# Patient Record
Sex: Male | Born: 1950
Health system: Southern US, Community
[De-identification: ages and names within clinical notes are randomized; demographics above are authoritative.]

## PROBLEM LIST (undated history)

## (undated) DIAGNOSIS — N200 Calculus of kidney: Secondary | ICD-10-CM

## (undated) DIAGNOSIS — I509 Heart failure, unspecified: Secondary | ICD-10-CM

## (undated) DIAGNOSIS — E785 Hyperlipidemia, unspecified: Secondary | ICD-10-CM

## (undated) DIAGNOSIS — Z5189 Encounter for other specified aftercare: Secondary | ICD-10-CM

## (undated) DIAGNOSIS — D649 Anemia, unspecified: Secondary | ICD-10-CM

## (undated) DIAGNOSIS — T7840XA Allergy, unspecified, initial encounter: Secondary | ICD-10-CM

## (undated) DIAGNOSIS — G4730 Sleep apnea, unspecified: Secondary | ICD-10-CM

## (undated) DIAGNOSIS — F329 Major depressive disorder, single episode, unspecified: Secondary | ICD-10-CM

## (undated) DIAGNOSIS — H409 Unspecified glaucoma: Secondary | ICD-10-CM

## (undated) DIAGNOSIS — I10 Essential (primary) hypertension: Secondary | ICD-10-CM

## (undated) DIAGNOSIS — F32A Depression, unspecified: Secondary | ICD-10-CM

## (undated) DIAGNOSIS — N400 Enlarged prostate without lower urinary tract symptoms: Secondary | ICD-10-CM

## (undated) DIAGNOSIS — E119 Type 2 diabetes mellitus without complications: Secondary | ICD-10-CM

## (undated) DIAGNOSIS — K219 Gastro-esophageal reflux disease without esophagitis: Secondary | ICD-10-CM

## (undated) HISTORY — DX: Type 2 diabetes mellitus without complications: E11.9

## (undated) HISTORY — DX: Anemia, unspecified: D64.9

## (undated) HISTORY — DX: Calculus of kidney: N20.0

## (undated) HISTORY — DX: Unspecified glaucoma: H40.9

## (undated) HISTORY — DX: Heart failure, unspecified: I50.9

## (undated) HISTORY — PX: EYE SURGERY: SHX253

## (undated) HISTORY — DX: Sleep apnea, unspecified: G47.30

## (undated) HISTORY — DX: Major depressive disorder, single episode, unspecified: F32.9

## (undated) HISTORY — DX: Encounter for other specified aftercare: Z51.89

## (undated) HISTORY — DX: Allergy, unspecified, initial encounter: T78.40XA

## (undated) HISTORY — DX: Benign prostatic hyperplasia without lower urinary tract symptoms: N40.0

## (undated) HISTORY — DX: Hyperlipidemia, unspecified: E78.5

## (undated) HISTORY — PX: POLYPECTOMY: SHX149

## (undated) HISTORY — DX: Gastro-esophageal reflux disease without esophagitis: K21.9

## (undated) HISTORY — PX: CARDIAC CATHETERIZATION: SHX172

## (undated) HISTORY — DX: Depression, unspecified: F32.A

## (undated) HISTORY — DX: Essential (primary) hypertension: I10

---

## 1997-09-02 ENCOUNTER — Inpatient Hospital Stay (HOSPITAL_COMMUNITY): Admission: EM | Admit: 1997-09-02 | Discharge: 1997-09-04 | Payer: Self-pay | Admitting: Emergency Medicine

## 2000-09-21 ENCOUNTER — Encounter: Payer: Self-pay | Admitting: Emergency Medicine

## 2000-09-21 ENCOUNTER — Emergency Department (HOSPITAL_COMMUNITY): Admission: EM | Admit: 2000-09-21 | Discharge: 2000-09-21 | Payer: Self-pay | Admitting: Emergency Medicine

## 2000-09-27 ENCOUNTER — Encounter: Payer: Self-pay | Admitting: Emergency Medicine

## 2000-09-27 ENCOUNTER — Emergency Department (HOSPITAL_COMMUNITY): Admission: EM | Admit: 2000-09-27 | Discharge: 2000-09-28 | Payer: Self-pay | Admitting: Emergency Medicine

## 2001-06-02 ENCOUNTER — Encounter: Admission: RE | Admit: 2001-06-02 | Discharge: 2001-06-02 | Payer: Self-pay | Admitting: Internal Medicine

## 2001-06-02 ENCOUNTER — Encounter: Payer: Self-pay | Admitting: Internal Medicine

## 2004-02-14 ENCOUNTER — Ambulatory Visit: Payer: Self-pay | Admitting: Internal Medicine

## 2004-02-21 ENCOUNTER — Ambulatory Visit: Payer: Self-pay | Admitting: Internal Medicine

## 2004-02-28 ENCOUNTER — Ambulatory Visit: Payer: Self-pay

## 2004-02-28 ENCOUNTER — Ambulatory Visit: Payer: Self-pay | Admitting: Internal Medicine

## 2004-03-21 ENCOUNTER — Encounter: Payer: Self-pay | Admitting: Internal Medicine

## 2004-03-21 ENCOUNTER — Ambulatory Visit: Payer: Self-pay

## 2004-04-05 ENCOUNTER — Ambulatory Visit: Payer: Self-pay | Admitting: Internal Medicine

## 2004-04-15 ENCOUNTER — Ambulatory Visit: Payer: Self-pay | Admitting: Internal Medicine

## 2004-05-17 ENCOUNTER — Ambulatory Visit: Payer: Self-pay | Admitting: Internal Medicine

## 2004-07-16 ENCOUNTER — Ambulatory Visit: Payer: Self-pay | Admitting: Internal Medicine

## 2004-09-18 ENCOUNTER — Ambulatory Visit: Payer: Self-pay | Admitting: Internal Medicine

## 2004-09-25 ENCOUNTER — Ambulatory Visit: Payer: Self-pay | Admitting: Internal Medicine

## 2004-12-25 ENCOUNTER — Ambulatory Visit: Payer: Self-pay | Admitting: Internal Medicine

## 2005-01-22 ENCOUNTER — Ambulatory Visit: Payer: Self-pay | Admitting: Internal Medicine

## 2005-02-12 ENCOUNTER — Ambulatory Visit: Payer: Self-pay | Admitting: Internal Medicine

## 2005-04-15 ENCOUNTER — Ambulatory Visit: Payer: Self-pay | Admitting: Internal Medicine

## 2005-05-28 ENCOUNTER — Ambulatory Visit: Payer: Self-pay | Admitting: Internal Medicine

## 2005-07-13 ENCOUNTER — Ambulatory Visit (HOSPITAL_COMMUNITY): Admission: RE | Admit: 2005-07-13 | Discharge: 2005-07-13 | Payer: Self-pay | Admitting: Family Medicine

## 2005-07-24 ENCOUNTER — Ambulatory Visit: Payer: Self-pay | Admitting: Internal Medicine

## 2005-12-17 ENCOUNTER — Ambulatory Visit: Payer: Self-pay | Admitting: Internal Medicine

## 2006-03-17 HISTORY — PX: COLONOSCOPY: SHX174

## 2006-03-27 ENCOUNTER — Ambulatory Visit: Payer: Self-pay | Admitting: Internal Medicine

## 2006-04-10 ENCOUNTER — Encounter (INDEPENDENT_AMBULATORY_CARE_PROVIDER_SITE_OTHER): Payer: Self-pay | Admitting: *Deleted

## 2006-04-10 ENCOUNTER — Encounter: Payer: Self-pay | Admitting: Internal Medicine

## 2006-04-10 ENCOUNTER — Ambulatory Visit: Payer: Self-pay | Admitting: Internal Medicine

## 2006-04-27 ENCOUNTER — Ambulatory Visit: Payer: Self-pay | Admitting: Internal Medicine

## 2006-04-27 LAB — CONVERTED CEMR LAB
ALT: 34 units/L (ref 0–40)
AST: 31 units/L (ref 0–37)
Basophils Relative: 0.7 % (ref 0.0–1.0)
Bilirubin, Direct: 0.1 mg/dL (ref 0.0–0.3)
CO2: 30 meq/L (ref 19–32)
Calcium: 8.8 mg/dL (ref 8.4–10.5)
Chloride: 103 meq/L (ref 96–112)
Creatinine, Ser: 0.8 mg/dL (ref 0.4–1.5)
Eosinophils Absolute: 0.1 10*3/uL (ref 0.0–0.6)
Eosinophils Relative: 1.5 % (ref 0.0–5.0)
GFR calc non Af Amer: 107 mL/min
Glucose, Bld: 130 mg/dL — ABNORMAL HIGH (ref 70–99)
HDL: 41.7 mg/dL (ref >39.0)
Platelets: 222 10*3/uL (ref 150–400)
RBC: 4.42 M/uL (ref 4.22–5.81)
RDW: 13 % (ref 11.5–14.6)
TSH: 2.19 microintl units/mL (ref 0.35–5.50)
Total CHOL/HDL Ratio: 5.7
Triglycerides: 126 mg/dL (ref 0–149)
VLDL: 25 mg/dL (ref 0–40)
WBC: 5.3 10*3/uL (ref 4.5–10.5)

## 2006-05-04 ENCOUNTER — Ambulatory Visit: Payer: Self-pay | Admitting: Internal Medicine

## 2006-09-28 ENCOUNTER — Ambulatory Visit: Payer: Self-pay | Admitting: Internal Medicine

## 2006-09-28 LAB — CONVERTED CEMR LAB
Alkaline Phosphatase: 78 units/L (ref 39–117)
BUN: 12 mg/dL (ref 6–23)
Bilirubin, Direct: 0.1 mg/dL (ref 0.0–0.3)
CO2: 30 meq/L (ref 19–32)
GFR calc Af Amer: 112 mL/min
Glucose, Bld: 93 mg/dL (ref 70–99)
Potassium: 4.1 meq/L (ref 3.5–5.1)
Total CHOL/HDL Ratio: 3.4
Total Protein: 6.9 g/dL (ref 6.0–8.3)
Triglycerides: 87 mg/dL (ref 0–149)

## 2006-09-30 DIAGNOSIS — G4733 Obstructive sleep apnea (adult) (pediatric): Secondary | ICD-10-CM | POA: Insufficient documentation

## 2006-09-30 DIAGNOSIS — K219 Gastro-esophageal reflux disease without esophagitis: Secondary | ICD-10-CM

## 2006-09-30 DIAGNOSIS — E785 Hyperlipidemia, unspecified: Secondary | ICD-10-CM

## 2006-09-30 DIAGNOSIS — J309 Allergic rhinitis, unspecified: Secondary | ICD-10-CM | POA: Insufficient documentation

## 2006-09-30 DIAGNOSIS — E1169 Type 2 diabetes mellitus with other specified complication: Secondary | ICD-10-CM | POA: Insufficient documentation

## 2006-09-30 DIAGNOSIS — F39 Unspecified mood [affective] disorder: Secondary | ICD-10-CM

## 2006-09-30 DIAGNOSIS — Z8546 Personal history of malignant neoplasm of prostate: Secondary | ICD-10-CM

## 2006-09-30 DIAGNOSIS — I10 Essential (primary) hypertension: Secondary | ICD-10-CM

## 2006-09-30 HISTORY — DX: Essential (primary) hypertension: I10

## 2006-09-30 HISTORY — DX: Hyperlipidemia, unspecified: E78.5

## 2006-09-30 HISTORY — DX: Gastro-esophageal reflux disease without esophagitis: K21.9

## 2006-09-30 HISTORY — DX: Obstructive sleep apnea (adult) (pediatric): G47.33

## 2006-09-30 HISTORY — DX: Allergic rhinitis, unspecified: J30.9

## 2006-12-21 ENCOUNTER — Ambulatory Visit: Payer: Self-pay | Admitting: Internal Medicine

## 2006-12-21 LAB — CONVERTED CEMR LAB
Alkaline Phosphatase: 75 units/L (ref 39–117)
BUN: 10 mg/dL (ref 6–23)
Basophils Absolute: 0 10*3/uL (ref 0.0–0.1)
Basophils Relative: 0.2 % (ref 0.0–1.0)
Bilirubin Urine: NEGATIVE
CO2: 31 meq/L (ref 19–32)
Cholesterol: 130 mg/dL (ref 0–200)
GFR calc Af Amer: 112 mL/min
Glucose, Urine, Semiquant: NEGATIVE
HDL: 31.8 mg/dL — ABNORMAL LOW (ref >39.0)
Hemoglobin: 12.9 g/dL — ABNORMAL LOW (ref 13.0–17.0)
Ketones, urine, test strip: NEGATIVE
Lymphocytes Relative: 36.4 % (ref 12.0–46.0)
MCHC: 35.9 g/dL (ref 30.0–36.0)
Monocytes Absolute: 0.5 10*3/uL (ref 0.2–0.7)
Monocytes Relative: 8.1 % (ref 3.0–11.0)
Neutro Abs: 3.4 10*3/uL (ref 1.4–7.7)
PSA: 2.57 ng/mL (ref 0.10–4.00)
Potassium: 4.4 meq/L (ref 3.5–5.1)
Sodium: 144 meq/L (ref 135–145)
Specific Gravity, Urine: 1.02
TSH: 2.95 microintl units/mL (ref 0.35–5.50)
Total Protein: 6.4 g/dL (ref 6.0–8.3)

## 2007-03-16 ENCOUNTER — Encounter: Payer: Self-pay | Admitting: Internal Medicine

## 2007-05-21 ENCOUNTER — Encounter: Payer: Self-pay | Admitting: Internal Medicine

## 2007-05-21 ENCOUNTER — Ambulatory Visit: Payer: Self-pay | Admitting: Cardiology

## 2007-05-28 ENCOUNTER — Encounter: Payer: Self-pay | Admitting: Internal Medicine

## 2007-05-28 ENCOUNTER — Ambulatory Visit: Payer: Self-pay

## 2007-09-16 ENCOUNTER — Ambulatory Visit: Payer: Self-pay | Admitting: Internal Medicine

## 2007-09-16 LAB — CONVERTED CEMR LAB
ALT: 30 units/L (ref 0–53)
AST: 21 units/L (ref 0–37)
Albumin: 3.6 g/dL (ref 3.5–5.2)
BUN: 9 mg/dL (ref 6–23)
Basophils Absolute: 0 10*3/uL (ref 0.0–0.1)
Basophils Relative: 0.2 % (ref 0.0–1.0)
Blood in Urine, dipstick: NEGATIVE
Calcium: 8.7 mg/dL (ref 8.4–10.5)
Creatinine, Ser: 0.9 mg/dL (ref 0.4–1.5)
Direct LDL: 174.5 mg/dL
Eosinophils Absolute: 0.1 10*3/uL (ref 0.0–0.7)
Eosinophils Relative: 1.8 % (ref 0.0–5.0)
GFR calc non Af Amer: 92 mL/min
Glucose, Urine, Semiquant: NEGATIVE
HCT: 41.4 % (ref 39.0–52.0)
HDL: 33.8 mg/dL — ABNORMAL LOW (ref >39.0)
Hemoglobin: 14.3 g/dL (ref 13.0–17.0)
MCHC: 34.5 g/dL (ref 30.0–36.0)
MCV: 88.2 fL (ref 78.0–100.0)
Neutro Abs: 2.5 10*3/uL (ref 1.4–7.7)
PSA: 1.82 ng/mL (ref 0.10–4.00)
RBC: 4.7 M/uL (ref 4.22–5.81)
Specific Gravity, Urine: 1.01
Total Bilirubin: 0.8 mg/dL (ref 0.3–1.2)
WBC: 4.5 10*3/uL (ref 4.5–10.5)
pH: 6

## 2007-09-23 ENCOUNTER — Ambulatory Visit: Payer: Self-pay | Admitting: Internal Medicine

## 2007-09-23 LAB — CONVERTED CEMR LAB
Cholesterol, target level: 200 mg/dL
LDL Goal: 100 mg/dL

## 2007-11-24 ENCOUNTER — Telehealth: Payer: Self-pay | Admitting: Internal Medicine

## 2007-12-20 ENCOUNTER — Ambulatory Visit: Payer: Self-pay | Admitting: Internal Medicine

## 2007-12-20 ENCOUNTER — Telehealth: Payer: Self-pay | Admitting: Internal Medicine

## 2007-12-20 DIAGNOSIS — R079 Chest pain, unspecified: Secondary | ICD-10-CM | POA: Insufficient documentation

## 2007-12-20 HISTORY — DX: Chest pain, unspecified: R07.9

## 2007-12-23 ENCOUNTER — Telehealth: Payer: Self-pay | Admitting: Internal Medicine

## 2007-12-28 ENCOUNTER — Ambulatory Visit: Payer: Self-pay | Admitting: Internal Medicine

## 2007-12-28 LAB — CONVERTED CEMR LAB
ALT: 45 units/L (ref 0–53)
Bilirubin, Direct: 0.1 mg/dL (ref 0.0–0.3)
Direct LDL: 97.4 mg/dL
HDL: 29.6 mg/dL — ABNORMAL LOW (ref >39.0)
Hgb A1c MFr Bld: 6.2 % — ABNORMAL HIGH (ref 4.6–6.0)
Total Bilirubin: 0.6 mg/dL (ref 0.3–1.2)
Total CHOL/HDL Ratio: 5.7
Triglycerides: 333 mg/dL (ref 0–149)

## 2008-01-04 ENCOUNTER — Ambulatory Visit: Payer: Self-pay | Admitting: Internal Medicine

## 2008-02-02 ENCOUNTER — Ambulatory Visit: Payer: Self-pay | Admitting: Internal Medicine

## 2008-02-09 ENCOUNTER — Ambulatory Visit: Payer: Self-pay | Admitting: Internal Medicine

## 2008-02-14 ENCOUNTER — Telehealth: Payer: Self-pay | Admitting: Internal Medicine

## 2008-02-21 ENCOUNTER — Ambulatory Visit: Payer: Self-pay | Admitting: Internal Medicine

## 2008-02-22 ENCOUNTER — Telehealth: Payer: Self-pay | Admitting: Internal Medicine

## 2008-02-22 DIAGNOSIS — R1011 Right upper quadrant pain: Secondary | ICD-10-CM

## 2008-02-25 ENCOUNTER — Ambulatory Visit (HOSPITAL_COMMUNITY): Admission: RE | Admit: 2008-02-25 | Discharge: 2008-02-25 | Payer: Self-pay | Admitting: Internal Medicine

## 2008-05-17 ENCOUNTER — Ambulatory Visit: Payer: Self-pay | Admitting: Internal Medicine

## 2008-08-08 ENCOUNTER — Ambulatory Visit: Payer: Self-pay | Admitting: Internal Medicine

## 2008-08-08 LAB — CONVERTED CEMR LAB
ALT: 25 units/L (ref 0–53)
AST: 20 units/L (ref 0–37)
Albumin: 3.7 g/dL (ref 3.5–5.2)
HDL: 39.8 mg/dL (ref >39.00)
Testosterone: 435.23 ng/dL (ref 350.00–890.00)
Total Protein: 6.6 g/dL (ref 6.0–8.3)
Triglycerides: 83 mg/dL (ref 0.0–149.0)
VLDL: 16.6 mg/dL (ref 0.0–40.0)

## 2008-08-22 ENCOUNTER — Ambulatory Visit: Payer: Self-pay | Admitting: Internal Medicine

## 2008-08-22 DIAGNOSIS — B070 Plantar wart: Secondary | ICD-10-CM

## 2008-12-18 ENCOUNTER — Ambulatory Visit: Payer: Self-pay | Admitting: Internal Medicine

## 2008-12-18 LAB — CONVERTED CEMR LAB
AST: 28 units/L (ref 0–37)
Albumin: 3.8 g/dL (ref 3.5–5.2)
Alkaline Phosphatase: 75 units/L (ref 39–117)
Basophils Relative: 0.5 % (ref 0.0–3.0)
CO2: 31 meq/L (ref 19–32)
Calcium: 9 mg/dL (ref 8.4–10.5)
Chloride: 107 meq/L (ref 96–112)
Eosinophils Absolute: 0.1 10*3/uL (ref 0.0–0.7)
Glucose, Urine, Semiquant: NEGATIVE
HCT: 37.9 % — ABNORMAL LOW (ref 39.0–52.0)
HDL: 38.7 mg/dL — ABNORMAL LOW (ref >39.00)
Hemoglobin: 13 g/dL (ref 13.0–17.0)
Lymphocytes Relative: 37.3 % (ref 12.0–46.0)
Lymphs Abs: 1.7 10*3/uL (ref 0.7–4.0)
MCHC: 34.4 g/dL (ref 30.0–36.0)
Monocytes Relative: 6.3 % (ref 3.0–12.0)
Neutro Abs: 2.5 10*3/uL (ref 1.4–7.7)
Nitrite: NEGATIVE
Potassium: 3.8 meq/L (ref 3.5–5.1)
RBC: 4.3 M/uL (ref 4.22–5.81)
Sodium: 146 meq/L — ABNORMAL HIGH (ref 135–145)
Specific Gravity, Urine: 1.02
Total CHOL/HDL Ratio: 4
Total Protein: 7.1 g/dL (ref 6.0–8.3)
WBC Urine, dipstick: NEGATIVE
pH: 6

## 2008-12-25 ENCOUNTER — Ambulatory Visit: Payer: Self-pay | Admitting: Internal Medicine

## 2009-02-26 ENCOUNTER — Ambulatory Visit: Payer: Self-pay | Admitting: Internal Medicine

## 2009-03-05 ENCOUNTER — Ambulatory Visit: Payer: Self-pay | Admitting: Internal Medicine

## 2009-03-05 DIAGNOSIS — E1142 Type 2 diabetes mellitus with diabetic polyneuropathy: Secondary | ICD-10-CM | POA: Insufficient documentation

## 2009-03-05 DIAGNOSIS — E1165 Type 2 diabetes mellitus with hyperglycemia: Secondary | ICD-10-CM

## 2009-03-05 DIAGNOSIS — E1149 Type 2 diabetes mellitus with other diabetic neurological complication: Secondary | ICD-10-CM

## 2009-03-05 DIAGNOSIS — IMO0002 Reserved for concepts with insufficient information to code with codable children: Secondary | ICD-10-CM

## 2009-03-05 DIAGNOSIS — E1140 Type 2 diabetes mellitus with diabetic neuropathy, unspecified: Secondary | ICD-10-CM | POA: Insufficient documentation

## 2009-03-05 HISTORY — DX: Type 2 diabetes mellitus with hyperglycemia: E11.65

## 2009-03-05 HISTORY — DX: Type 2 diabetes mellitus with diabetic polyneuropathy: E11.42

## 2009-03-05 HISTORY — DX: Reserved for concepts with insufficient information to code with codable children: IMO0002

## 2009-03-05 HISTORY — DX: Type 2 diabetes mellitus with hyperglycemia: E11.49

## 2009-03-16 ENCOUNTER — Emergency Department (HOSPITAL_COMMUNITY): Admission: EM | Admit: 2009-03-16 | Discharge: 2009-03-17 | Payer: Self-pay | Admitting: Emergency Medicine

## 2009-03-30 ENCOUNTER — Encounter (INDEPENDENT_AMBULATORY_CARE_PROVIDER_SITE_OTHER): Payer: Self-pay | Admitting: *Deleted

## 2009-04-12 ENCOUNTER — Encounter (INDEPENDENT_AMBULATORY_CARE_PROVIDER_SITE_OTHER): Payer: Self-pay | Admitting: *Deleted

## 2009-04-12 ENCOUNTER — Ambulatory Visit: Payer: Self-pay | Admitting: Internal Medicine

## 2009-04-25 ENCOUNTER — Ambulatory Visit: Payer: Self-pay | Admitting: Internal Medicine

## 2009-04-26 ENCOUNTER — Encounter: Payer: Self-pay | Admitting: Internal Medicine

## 2009-05-28 ENCOUNTER — Ambulatory Visit: Payer: Self-pay | Admitting: Internal Medicine

## 2009-05-28 LAB — CONVERTED CEMR LAB
Bilirubin, Direct: 0.1 mg/dL (ref 0.0–0.3)
LDL Cholesterol: 82 mg/dL (ref 0–99)
Total Bilirubin: 0.6 mg/dL (ref 0.3–1.2)
VLDL: 17.8 mg/dL (ref 0.0–40.0)

## 2009-06-04 ENCOUNTER — Ambulatory Visit: Payer: Self-pay | Admitting: Internal Medicine

## 2009-08-27 ENCOUNTER — Ambulatory Visit: Payer: Self-pay | Admitting: Internal Medicine

## 2009-08-27 LAB — CONVERTED CEMR LAB: Hgb A1c MFr Bld: 7.2 % — ABNORMAL HIGH (ref 4.6–6.5)

## 2009-09-03 ENCOUNTER — Ambulatory Visit: Payer: Self-pay | Admitting: Internal Medicine

## 2009-09-19 ENCOUNTER — Encounter: Payer: Self-pay | Admitting: Internal Medicine

## 2009-09-19 DIAGNOSIS — E291 Testicular hypofunction: Secondary | ICD-10-CM

## 2009-09-19 DIAGNOSIS — M779 Enthesopathy, unspecified: Secondary | ICD-10-CM | POA: Insufficient documentation

## 2009-09-19 HISTORY — DX: Testicular hypofunction: E29.1

## 2009-09-27 ENCOUNTER — Telehealth: Payer: Self-pay | Admitting: *Deleted

## 2009-09-27 ENCOUNTER — Telehealth: Payer: Self-pay | Admitting: Internal Medicine

## 2009-10-04 ENCOUNTER — Encounter: Payer: Self-pay | Admitting: Internal Medicine

## 2009-10-04 ENCOUNTER — Telehealth: Payer: Self-pay | Admitting: Internal Medicine

## 2009-12-03 ENCOUNTER — Ambulatory Visit: Payer: Self-pay | Admitting: Internal Medicine

## 2009-12-03 LAB — CONVERTED CEMR LAB: Hgb A1c MFr Bld: 7.8 % — ABNORMAL HIGH (ref 4.6–6.5)

## 2009-12-10 ENCOUNTER — Ambulatory Visit: Payer: Self-pay | Admitting: Internal Medicine

## 2010-02-22 ENCOUNTER — Ambulatory Visit: Payer: Self-pay | Admitting: Internal Medicine

## 2010-02-22 LAB — CONVERTED CEMR LAB
Creatinine,U: 219.7 mg/dL
Microalb Creat Ratio: 0.8 mg/g (ref 0.0–30.0)
Microalb, Ur: 1.7 mg/dL (ref 0.0–1.9)

## 2010-03-01 ENCOUNTER — Ambulatory Visit: Payer: Self-pay | Admitting: Internal Medicine

## 2010-03-01 DIAGNOSIS — K760 Fatty (change of) liver, not elsewhere classified: Secondary | ICD-10-CM

## 2010-03-01 HISTORY — DX: Fatty (change of) liver, not elsewhere classified: K76.0

## 2010-03-01 LAB — CONVERTED CEMR LAB
BUN: 16 mg/dL (ref 6–23)
Calcium: 9 mg/dL (ref 8.4–10.5)
Creatinine, Ser: 0.7 mg/dL (ref 0.4–1.5)
GFR calc non Af Amer: 124.39 mL/min (ref >60.00)
Glucose, Bld: 160 mg/dL — ABNORMAL HIGH (ref 70–99)

## 2010-03-05 ENCOUNTER — Ambulatory Visit (HOSPITAL_COMMUNITY)
Admission: RE | Admit: 2010-03-05 | Discharge: 2010-03-05 | Payer: Self-pay | Source: Home / Self Care | Attending: Internal Medicine | Admitting: Internal Medicine

## 2010-04-16 NOTE — Assessment & Plan Note (Signed)
Summary: 3 MTH ROV // RS   Vital Signs:  Patient profile:   60 year old male Height:      75 inches Weight:      306 pounds BMI:     38.39 Temp:     98.2 degrees F oral Pulse rate:   72 / minute Resp:     14 per minute BP sitting:   132 / 82  (left arm) Cuff size:   large  Vitals Entered By: Willy Eddy, LPN (December 10, 2009 9:44 AM) CC: roa-labs- dr Aldean Ast wants him to stop the hctz in the benicar due to being on flomax, Hypertension Management Is Patient Diabetic? No   Primary Care Provider:  Stacie Glaze MD  CC:  roa-labs- dr Aldean Ast wants him to stop the hctz in the benicar due to being on flomax and Hypertension Management.  History of Present Illness: The pt presents for follow up of HTN and DM Does not check CBG's Has persistant pain in the right flank at the floating ribs weigth loss has not continued has not been exercizing liver pain?     Hypertension History:      He denies headache, chest pain, palpitations, dyspnea with exertion, orthopnea, PND, peripheral edema, visual symptoms, neurologic problems, syncope, and side effects from treatment.        Positive major cardiovascular risk factors include male age 32 years old or older, diabetes, hyperlipidemia, and hypertension.  Negative major cardiovascular risk factors include non-tobacco-user status.     Preventive Screening-Counseling & Management  Alcohol-Tobacco     Smoking Status: quit  Problems Prior to Update: 1)  Hypogonadism  (ICD-257.2) 2)  Hamstring Tendinitis  (ICD-726.90) 3)  Plantar Wart  (ICD-078.12) 4)  Abdominal Pain, Right Upper Quadrant  (ICD-789.01) 5)  Chest Pain, Intermittent  (ICD-786.50) 6)  Diabetes Mellitus, Controlled  (ICD-250.00) 7)  Physical Examination  (ICD-V70.0) 8)  Insomnia  (ICD-780.52) 9)  Hypertension  (ICD-401.9) 10)  Hyperlipidemia  (ICD-272.4) 11)  Gerd  (ICD-530.81) 12)  Depression  (ICD-311) 13)  Prostate Cancer, Hx of  (ICD-V10.46) 14)   Allergic Rhinitis  (ICD-477.9)  Current Problems (verified): 1)  Hypogonadism  (ICD-257.2) 2)  Hamstring Tendinitis  (ICD-726.90) 3)  Plantar Wart  (ICD-078.12) 4)  Abdominal Pain, Right Upper Quadrant  (ICD-789.01) 5)  Chest Pain, Intermittent  (ICD-786.50) 6)  Diabetes Mellitus, Controlled  (ICD-250.00) 7)  Physical Examination  (ICD-V70.0) 8)  Insomnia  (ICD-780.52) 9)  Hypertension  (ICD-401.9) 10)  Hyperlipidemia  (ICD-272.4) 11)  Gerd  (ICD-530.81) 12)  Depression  (ICD-311) 13)  Prostate Cancer, Hx of  (ICD-V10.46) 14)  Allergic Rhinitis  (ICD-477.9)  Medications Prior to Update: 1)  Acular 0.5 % Soln (Ketorolac Tromethamine) .... Use As Directed 2)  Benicar Hct 20-12.5 Mg Tabs (Olmesartan Medoxomil-Hctz) .Marland Kitchen.. 1 Once Daily Hold 3)  Crestor 10 Mg Tabs (Rosuvastatin Calcium) .Marland Kitchen.. 1 Once Daily 4)  Flomax 0.4 Mg Caps (Tamsulosin Hcl) .Marland Kitchen.. 1 Once Daily 5)  Temazepam 30 Mg Caps (Temazepam) .Marland Kitchen.. 1 At Bedtime As Needed 6)  Androgel Pump 1 % Gel (Testosterone) .... 8 Pumps Once Daily 7)  Alprazolam 0.5 Mg Tabs (Alprazolam) .... One By Mouth Three Times A Day Prn 8)  Ambien 10 Mg Tabs (Zolpidem Tartrate) .... One Hs 9)  Nexium 40 Mg Cpdr (Esomeprazole Magnesium) .... One By Mouth Bid 10)  Onglyza 5 Mg Tabs (Saxagliptin Hcl) .... One  By Mouth Daily  Current Medications (verified): 1)  Acular 0.5 % Soln (  Ketorolac Tromethamine) .... Use As Directed 2)  Benicar Hct 20-12.5 Mg Tabs (Olmesartan Medoxomil-Hctz) .Marland Kitchen.. 1 Once Daily in The Am 3)  Crestor 10 Mg Tabs (Rosuvastatin Calcium) .Marland Kitchen.. 1 Once Daily 4)  Flomax 0.4 Mg Caps (Tamsulosin Hcl) .Marland Kitchen.. 1 Once Daily 5)  Temazepam 30 Mg Caps (Temazepam) .Marland Kitchen.. 1 At Bedtime As Needed 6)  Testim 50 Mg/5gm Gel (Testosterone) .Marland Kitchen.. 1 Pack Every Other Day and 1.5 Pack Every Other Day Alternating 7)  Alprazolam 0.5 Mg Tabs (Alprazolam) .... One By Mouth Three Times A Day Prn 8)  Ambien 10 Mg Tabs (Zolpidem Tartrate) .... One Hs 9)  Nexium 40 Mg Cpdr  (Esomeprazole Magnesium) .... One By Mouth Bid 10)  Onglyza 5 Mg Tabs (Saxagliptin Hcl) .... One  By Mouth Daily  Allergies (verified): 1)  ! * Ivp Dye  Past History:  Family History: Last updated: 09/30/2006 Fam hx MI Family History Other cancer-Prostate Fam hx Leukemia  Social History: Last updated: 09/30/2006 Former Smoker Alcohol use-no Drug use-no Married  Risk Factors: Exercise: no (03/05/2009)  Risk Factors: Smoking Status: quit (12/10/2009)  Past medical, surgical, family and social histories (including risk factors) reviewed, and no changes noted (except as noted below).  Past Medical History: Reviewed history from 09/30/2006 and no changes required. Allergic rhinitis Prostate cancer, hx of Depression GERD Hyperlipidemia Hypertension  Past Surgical History: Reviewed history from 09/30/2006 and no changes required. Colonoscopy-04/10/2006  Family History: Reviewed history from 09/30/2006 and no changes required. Fam hx MI Family History Other cancer-Prostate Fam hx Leukemia  Social History: Reviewed history from 09/30/2006 and no changes required. Former Smoker Alcohol use-no Drug use-no Married  Review of Systems  The patient denies anorexia, fever, weight loss, weight gain, vision loss, decreased hearing, hoarseness, chest pain, syncope, dyspnea on exertion, peripheral edema, prolonged cough, headaches, hemoptysis, abdominal pain, melena, hematochezia, severe indigestion/heartburn, hematuria, incontinence, genital sores, muscle weakness, suspicious skin lesions, transient blindness, difficulty walking, depression, unusual weight change, abnormal bleeding, enlarged lymph nodes, angioedema, and breast masses.         Flu Vaccine Consent Questions     Do you have a history of severe allergic reactions to this vaccine? no    Any prior history of allergic reactions to egg and/or gelatin? no    Do you have a sensitivity to the preservative Thimersol?  no    Do you have a past history of Guillan-Barre Syndrome? no    Do you currently have an acute febrile illness? no    Have you ever had a severe reaction to latex? no    Vaccine information given and explained to patient? yes    Are you currently pregnant? no    Lot Number:[id number]   Exp Date:09/14/2010   Site Given  Left Deltoid IM   Physical Exam  General:  well-developed and overweight-appearing.   Head:  Normocephalic and atraumatic without obvious abnormalities. No apparent alopecia or balding. Eyes:  pupils equal, pupils round, and pupils reactive to light.   Ears:  R ear normal and L ear normal.   Nose:  no external deformity and no nasal discharge.   Mouth:  pharynx pink and moist and no erythema.   Neck:  No deformities, masses, or tenderness noted. Lungs:  Normal respiratory effort, chest expands symmetrically. Lungs are clear to auscultation, no crackles or wheezes. Heart:  Normal rate and regular rhythm. S1 and S2 normal without gallop, murmur, click, rub or other extra sounds. Abdomen:  Bowel sounds positive,abdomen soft and non-tender  without masses, organomegaly or hernias noted.   Impression & Recommendations:  Problem # 1:  HYPOGONADISM (ICD-257.2) Assessment Unchanged  Problem # 2:  DIABETES MELLITUS, CONTROLLED (ICD-250.00) Assessment: Unchanged  His updated medication list for this problem includes:    Benicar Hct 20-12.5 Mg Tabs (Olmesartan medoxomil-hctz) .Marland Kitchen... 1 once daily in the am    Onglyza 5 Mg Tabs (Saxagliptin hcl) ..... One  by mouth daily  Labs Reviewed: Creat: 1.0 (12/18/2008)    Reviewed HgBA1c results: 7.8 (12/03/2009)  7.2 (08/27/2009)  Problem # 3:  DIABETES MELLITUS, CONTROLLED (ICD-250.00) Assessment: Deteriorated  His updated medication list for this problem includes:    Benicar Hct 20-12.5 Mg Tabs (Olmesartan medoxomil-hctz) .Marland Kitchen... 1 once daily in the am    Onglyza 5 Mg Tabs (Saxagliptin hcl) ..... One  by mouth daily  Labs  Reviewed: Creat: 1.0 (12/18/2008)    Reviewed HgBA1c results: 7.8 (12/03/2009)  7.2 (08/27/2009) weight loss is needed and has been less that  adherant  Problem # 4:  HYPERLIPIDEMIA (ICD-272.4)  His updated medication list for this problem includes:    Crestor 10 Mg Tabs (Rosuvastatin calcium) .Marland Kitchen... 1 once daily  Labs Reviewed: SGOT: 25 (05/28/2009)   SGPT: 32 (05/28/2009)  Lipid Goals: Chol Goal: 200 (09/23/2007)   HDL Goal: 40 (09/23/2007)   LDL Goal: 100 (09/23/2007)   TG Goal: 150 (09/23/2007)  10 Yr Risk Heart Disease: 9 % Prior 10 Yr Risk Heart Disease: 7 % (06/04/2009)   HDL:46.70 (05/28/2009), 38.70 (12/18/2008)  LDL:82 (05/28/2009), 102 (13/10/6576)  Chol:146 (05/28/2009), 157 (12/18/2008)  Trig:89.0 (05/28/2009), 80.0 (12/18/2008)  Problem # 5:  ABDOMINAL PAIN RIGHT UPPER QUADRANT (ICD-789.01)  Discussed symptom control with the patient.   Problem # 6:  HYPERTENSION (ICD-401.9)  His updated medication list for this problem includes:    Benicar Hct 20-12.5 Mg Tabs (Olmesartan medoxomil-hctz) .Marland Kitchen... 1 once daily in the am  BP today: 132/82 Prior BP: 110/70 (09/03/2009)  10 Yr Risk Heart Disease: 9 % Prior 10 Yr Risk Heart Disease: 7 % (06/04/2009)  Labs Reviewed: K+: 3.8 (12/18/2008) Creat: : 1.0 (12/18/2008)   Chol: 146 (05/28/2009)   HDL: 46.70 (05/28/2009)   LDL: 82 (05/28/2009)   TG: 89.0 (05/28/2009)  Complete Medication List: 1)  Acular 0.5 % Soln (Ketorolac tromethamine) .... Use as directed 2)  Benicar Hct 20-12.5 Mg Tabs (Olmesartan medoxomil-hctz) .Marland Kitchen.. 1 once daily in the am 3)  Crestor 10 Mg Tabs (Rosuvastatin calcium) .Marland Kitchen.. 1 once daily 4)  Flomax 0.4 Mg Caps (Tamsulosin hcl) .Marland Kitchen.. 1 once daily 5)  Temazepam 30 Mg Caps (Temazepam) .Marland Kitchen.. 1 at bedtime as needed 6)  Testim 50 Mg/5gm Gel (Testosterone) .Marland Kitchen.. 1 pack every other day and 1.5 pack every other day alternating 7)  Alprazolam 0.5 Mg Tabs (Alprazolam) .... One by mouth three times a day prn 8)   Ambien 10 Mg Tabs (Zolpidem tartrate) .... One hs 9)  Nexium 40 Mg Cpdr (Esomeprazole magnesium) .... One by mouth bid 10)  Onglyza 5 Mg Tabs (Saxagliptin hcl) .... One  by mouth daily  Other Orders: Admin 1st Vaccine (46962) Flu Vaccine 31yrs + [phone])  Hypertension Assessment/Plan:      The patient's hypertensive risk group is category C: Target organ damage and/or diabetes.  His calculated 10 year risk of coronary heart disease is 9 %.  Today's blood pressure is 132/82.  His blood pressure goal is < 140/90.  Patient Instructions: 1)  HbgA1C prior to visit, ICD-9: 250.00 2)  Urine Microalbumin prior  to visit, ICD-9:250.00 3)  Please schedule a follow-up appointment in 3 months. Prescriptions: ONGLYZA 5 MG TABS (SAXAGLIPTIN HCL) ONE  by mouth DAILY  #30 x 11   Entered and Authorized by:   Stacie Glaze MD   Signed by:   Stacie Glaze MD on 12/10/2009   Method used:   [facility]        [facility]  [place] [id number]       [address]       [place], [place]  [address]       Ph: [phone] or [phone]       Fax: [phone]   RxID:   [id number]   Appended Document: Orders Update     Clinical Lists Changes  Orders: Added new Service order of Est. Patient Level IV (34742) - Signed

## 2010-04-16 NOTE — Progress Notes (Signed)
Summary: [employer] called. Req for Testosterone has been denied  Phone Note From Other Clinic Call back at [facility] -  [phone] ext [phone] or [phone] Appeals    Caller: [employer] Neibert    Kipp Laurence.  Reason for Call: Diagnosis Check Summary of Call: [employer] rcvd a req for a PCR review (Provider Courtesy Review).  Req for Testosterone has been denied.  [employer] has already done PCR, and it has been upheld.  [employer] said that in order to get an appeal, the pt would have to sign an authorization that would give Dr. Lovell Sheehan permission to sign on pts behalf.        Fax appeal to [phone] Initial call taken by: Lucy Antigua,  September 27, 2009 10:28 AM  Follow-up for Phone Call        After first denial , I sent them a letter explaining that I had put his last testosoterone level on the form ,but he had had been taking testosterone for 10 years and was unable to get pre testim testostone level because the charts had been archived due to EMR.  That was also denied.  Then I had chart pulled and his pre testim testosterone level was 255.23 and that was sent to them and it was denied, left message on machine for pt to call and discuss.  Follow-up by: Willy Eddy, LPN,  September 27, 2009 10:38 AM

## 2010-04-16 NOTE — Letter (Signed)
Summary: Colonoscopy Letter  [facility]  [address] [place], [place] [address]   Phone: [phone]  Fax: [phone]      March 30, 2009 MRN: [id number]   [facility] [address] [place], [place]  [address]   Dear Mr. Saccente,   According to your medical record, it is time for you to schedule a Colonoscopy. The American Cancer Society recommends this procedure as a method to detect early colon cancer. Patients with a family history of colon cancer, or a personal history of colon polyps or inflammatory bowel disease are at increased risk.  This letter has beeen generated based on the recommendations made at the time of your procedure. If you feel that in your particular situation this may no longer apply, please contact our office.  Please call our office at [phone] to schedule this appointment or to update your records at your earliest convenience.  Thank you for cooperating with [place] to provide you with the very best care possible.   Sincerely,  Wilhemina Bonito. Marina Goodell, M.D.  [facility] [phone]

## 2010-04-16 NOTE — Miscellaneous (Signed)
Summary: recall col...em  Clinical Lists Changes  Medications: Added new medication of MOVIPREP 100 GM  SOLR (PEG-KCL-NACL-NASULF-NA ASC-C) As per prep instructions. - Signed Rx of MOVIPREP 100 GM  SOLR (PEG-KCL-NACL-NASULF-NA ASC-C) As per prep instructions.;  #1 x 0;  Signed;  Entered by: Karl Bales RN;  Authorized by: Hilarie Fredrickson MD;  Method used: [facility]  [place], [place], [place]  [address], Ph: [phone] or [phone], Fax: [phone]    Prescriptions: MOVIPREP 100 GM  SOLR (PEG-KCL-NACL-NASULF-NA ASC-C) As per prep instructions.  #1 x 0   Entered by:   Karl Bales RN   Authorized by:   Hilarie Fredrickson MD   Signed by:   Karl Bales RN on 04/12/2009   Method used:   [facility]        [facility]  [place] [facility]ail)       [address]       [place], [place]  [address]       Ph: [phone] or [phone]       Fax: [phone]   RxID:   [id number]

## 2010-04-16 NOTE — Miscellaneous (Signed)
Summary: Orders Update   Clinical Lists Changes  Problems: Added new problem of HAMSTRING TENDINITIS (ICD-726.90) Added new problem of HYPOGONADISM (ICD-257.2)

## 2010-04-16 NOTE — Progress Notes (Signed)
  Phone Note From Pharmacy   Reason for Call: Acute Illness Summary of Call: pt called stating he had talked to [employer] and dr Lovell Sheehan would have to do a peer to peer review for his testosterone Initial call taken by: Willy Eddy, LPN,  October 04, 2009 11:27 AM  Follow-up for Phone Call        per dr Lovell Sheehan- will try to do peer to peer review, but have pt call [employer] for them to send [place] apporpriate papers Follow-up by: Willy Eddy, LPN,  October 04, 2009 12:33 PM

## 2010-04-16 NOTE — Medication Information (Signed)
Summary: Androderm Approved  Androderm Approved   Imported By: Maryln Gottron 10/08/2009 14:20:49  _____________________________________________________________________  External Attachment:    Type:   Image     Comment:   External Document

## 2010-04-16 NOTE — Procedures (Signed)
Summary: Colonoscopy  Patient: Alejandro Koch Note: All result statuses are Final unless otherwise noted.  Tests: (1) Colonoscopy (COL)   COL Colonoscopy           DONE     [facility]     [address]     [place], [place]  [address]           COLONOSCOPY PROCEDURE REPORT           PATIENT:  Alejandro Koch, Alejandro Koch  MR#:  [id number]     BIRTHDATE:  11-Feb-1951, 58 yrs. old  GENDER:  male           ENDOSCOPIST:  Wilhemina Bonito. Eda Keys, MD     Referred by:  Surveillance Program Recall,           PROCEDURE DATE:  04/25/2009     PROCEDURE:  Colonoscopy with snare polypectomy x 5     ASA CLASS:  Class II     INDICATIONS:  history of pre-cancerous (adenomatous) colon polyps     (index 1998 w/ small adenoma; F/U 2001,2004,2008) ; Father w/ hx     colon cancer           MEDICATIONS:   Fentanyl 100 mcg IV, Versed 10 mg IV           DESCRIPTION OF PROCEDURE:   After the risks benefits and     alternatives of the procedure were thoroughly explained, informed     consent was obtained.  Digital rectal exam was performed and     revealed no abnormalities.   The LB [id number] endoscope     was introduced through the anus and advanced to the cecum, which     was identified by both the appendix and ileocecal valve, without     limitations.Time to cecum = 1:35 min.  The quality of the prep was     excellent, using MoviPrep.  The instrument was then slowly     withdrawn (time = 13:25 min) as the colon was fully examined.     <<PROCEDUREIMAGES>>           FINDINGS:  Five polyps were found in the cecum (1mm) and     ascending colon (5mm,2mm,3mm,3mm,5mm). Polyps were snared without     cautery. Retrieval was successful in 3 of 5.  Moderate     diverticulosis was found in the sigmoid colon.   Retroflexed views     in the rectum revealed no abnormalities.    The scope was then     withdrawn from the patient and the procedure completed.           COMPLICATIONS:  None           ENDOSCOPIC  IMPRESSION:     1) Five polyps - removed     2) Moderate diverticulosis in the sigmoid colon           RECOMMENDATIONS:     1) Follow up colonoscopy in 3 years           ______________________________     Wilhemina Bonito. Eda Keys, MD           CC:  Stacie Glaze, MD; The Patient           n.     eSIGNED:   Wilhemina Bonito. Eda Keys at 04/25/2009 10:12 AM           [place], [id number]  Note: An exclamation mark (!) indicates  a result that was not dispersed into the flowsheet. Document Creation Date: 04/25/2009 10:18 AM _______________________________________________________________________  (1) Order result status: Final Collection or observation date-time: 04/25/2009 10:05 Requested date-time:  Receipt date-time:  Reported date-time:  Referring Physician:   Ordering Physician: Fransico Setters [phone]) Specimen Source:  Source: Launa Grill Order Number: [id number] Lab site:   Appended Document: Colonoscopy 3 yrs recall     Procedures Next Due Date:    Colonoscopy: 04/2012

## 2010-04-16 NOTE — Progress Notes (Signed)
Summary: Denied PA request  Phone Note Outgoing Call   Summary of Call: Called [employer] to request PA form for his Androgel 1% gel Pump. spoke with Trinda Pascal @ [employer] and she told me that Under the patient's plan, that nine og the Andogens products were covered, even if a PA is done. So, she did not fax a PA form to this office because it would be denied. No case # was given to me. I spoke with the patient to explain this and he is going to call his ins co. [facility] have also [facility] [place] [facility] PA requests. They sent a total of 10 requests today. [employer] phone number is [phone]. Initial call taken by: Warnell Forester,  September 27, 2009 3:45 PM  Follow-up for Phone Call        talked with pt and explained that [employer] wouldnt pa- I told him he would need to call them and discuss they we had changed meds ,tried to get prioirauthroization and they have denied every thing Follow-up by: Willy Eddy, LPN,  October 04, 2009 9:40 AM

## 2010-04-16 NOTE — Assessment & Plan Note (Signed)
Summary: 3 month rov/njr   Vital Signs:  Patient profile:   60 year old male Height:      75 inches Weight:      306 pounds BMI:     38.39 Temp:     98.2 degrees F oral Pulse rate:   80 / minute Resp:     14 per minute BP sitting:   110 / 70  (left arm)  Vitals Entered By: Willy Eddy, LPN (June 04, 2009 9:44 AM) CC: roa labs, Hypertension Management   CC:  roa labs and Hypertension Management.  History of Present Illness: The weigth is down and the DM control is better as the Bloodpressure control  had renal stone and prostatitis in dec (see hospital records) prostate was swollen and nocturai is down to one a night on flomax has prolong course treatment of cipro [place]  Hypertension History:      He denies headache, chest pain, palpitations, dyspnea with exertion, orthopnea, PND, peripheral edema, visual symptoms, neurologic problems, syncope, and side effects from treatment.        Positive major cardiovascular risk factors include male age 17 years old or older, diabetes, hyperlipidemia, and hypertension.  Negative major cardiovascular risk factors include non-tobacco-user status.     Preventive Screening-Counseling & Management  Alcohol-Tobacco     Smoking Status: quit  Problems Prior to Update: 1)  Plantar Wart  (ICD-078.12) 2)  Abdominal Pain, Right Upper Quadrant  (ICD-789.01) 3)  Chest Pain, Intermittent  (ICD-786.50) 4)  Diabetes Mellitus, Controlled  (ICD-250.00) 5)  Physical Examination  (ICD-V70.0) 6)  Insomnia  (ICD-780.52) 7)  Hypertension  (ICD-401.9) 8)  Hyperlipidemia  (ICD-272.4) 9)  Gerd  (ICD-530.81) 10)  Depression  (ICD-311) 11)  Prostate Cancer, Hx of  (ICD-V10.46) 12)  Allergic Rhinitis  (ICD-477.9)  Current Problems (verified): 1)  Plantar Wart  (ICD-078.12) 2)  Abdominal Pain, Right Upper Quadrant  (ICD-789.01) 3)  Chest Pain, Intermittent  (ICD-786.50) 4)  Diabetes Mellitus, Controlled  (ICD-250.00) 5)  Physical  Examination  (ICD-V70.0) 6)  Insomnia  (ICD-780.52) 7)  Hypertension  (ICD-401.9) 8)  Hyperlipidemia  (ICD-272.4) 9)  Gerd  (ICD-530.81) 10)  Depression  (ICD-311) 11)  Prostate Cancer, Hx of  (ICD-V10.46) 12)  Allergic Rhinitis  (ICD-477.9)  Medications Prior to Update: 1)  Acular 0.5 % Soln (Ketorolac Tromethamine) .... Use As Directed 2)  Benicar Hct 20-12.5 Mg Tabs (Olmesartan Medoxomil-Hctz) .Marland Kitchen.. 1 Once Daily Hold 3)  Crestor 10 Mg Tabs (Rosuvastatin Calcium) .Marland Kitchen.. 1 Once Daily 4)  Doxazosin Mesylate 4 Mg Tabs (Doxazosin Mesylate) .Marland Kitchen.. 1 Once Daily 5)  Temazepam 30 Mg Caps (Temazepam) .Marland Kitchen.. 1 At Bedtime As Needed 6)  Testim 1 % Gel (Testosterone) .... 2 Once Daily 7)  Alprazolam 0.5 Mg Tabs (Alprazolam) .... One By Mouth Three Times A Day Prn 8)  Ambien 10 Mg Tabs (Zolpidem Tartrate) .... One Hs 9)  Nexium 40 Mg Cpdr (Esomeprazole Magnesium) .... One By Mouth Bid 10)  Onglyza 5 Mg Tabs (Saxagliptin Hcl) .... One  By Mouth Daily  Current Medications (verified): 1)  Acular 0.5 % Soln (Ketorolac Tromethamine) .... Use As Directed 2)  Benicar Hct 20-12.5 Mg Tabs (Olmesartan Medoxomil-Hctz) .Marland Kitchen.. 1 Once Daily Hold 3)  Crestor 10 Mg Tabs (Rosuvastatin Calcium) .Marland Kitchen.. 1 Once Daily 4)  Flomax 0.4 Mg Caps (Tamsulosin Hcl) .Marland Kitchen.. 1 Once Daily 5)  Temazepam 30 Mg Caps (Temazepam) .Marland Kitchen.. 1 At Bedtime As Needed 6)  Testim 1 % Gel (Testosterone) .... 2 Once  Daily 7)  Alprazolam 0.5 Mg Tabs (Alprazolam) .... One By Mouth Three Times A Day Prn 8)  Ambien 10 Mg Tabs (Zolpidem Tartrate) .... One Hs 9)  Nexium 40 Mg Cpdr (Esomeprazole Magnesium) .... One By Mouth Bid 10)  Onglyza 5 Mg Tabs (Saxagliptin Hcl) .... One  By Mouth Daily  Allergies (verified): 1)  ! * Ivp Dye  Past History:  Family History: Last updated: 09/30/2006 Fam hx MI Family History Other cancer-Prostate Fam hx Leukemia  Social History: Last updated: 09/30/2006 Former Smoker Alcohol use-no Drug use-no Married  Risk  Factors: Exercise: no (03/05/2009)  Risk Factors: Smoking Status: quit (06/04/2009)  Past medical, surgical, family and social histories (including risk factors) reviewed for relevance to current acute and chronic problems.  Past Medical History: Reviewed history from 09/30/2006 and no changes required. Allergic rhinitis Prostate cancer, hx of Depression GERD Hyperlipidemia Hypertension  Past Surgical History: Reviewed history from 09/30/2006 and no changes required. Colonoscopy-04/10/2006  Family History: Reviewed history from 09/30/2006 and no changes required. Fam hx MI Family History Other cancer-Prostate Fam hx Leukemia  Social History: Reviewed history from 09/30/2006 and no changes required. Former Smoker Alcohol use-no Drug use-no Married  Review of Systems  The patient denies anorexia, fever, weight loss, weight gain, vision loss, decreased hearing, hoarseness, chest pain, syncope, dyspnea on exertion, peripheral edema, prolonged cough, headaches, hemoptysis, abdominal pain, melena, hematochezia, severe indigestion/heartburn, hematuria, incontinence, genital sores, muscle weakness, suspicious skin lesions, transient blindness, difficulty walking, depression, unusual weight change, abnormal bleeding, enlarged lymph nodes, angioedema, and breast masses.    Physical Exam  General:  well-developed and overweight-appearing.   Head:  Normocephalic and atraumatic without obvious abnormalities. No apparent alopecia or balding. Eyes:  pupils equal, pupils round, and pupils reactive to light.   Nose:  no external deformity and no nasal discharge.   Mouth:  pharynx pink and moist and no erythema.   Neck:  No deformities, masses, or tenderness noted. Lungs:  Normal respiratory effort, chest expands symmetrically. Lungs are clear to auscultation, no crackles or wheezes. Heart:  Normal rate and regular rhythm. S1 and S2 normal without gallop, murmur, click, rub or other extra  sounds. Abdomen:  Bowel sounds positive,abdomen soft and non-tender without masses, organomegaly or hernias noted. Msk:  No deformity or scoliosis noted of thoracic or lumbar spine.   Pulses:  R and L carotid,radial,femoral,dorsalis pedis and posterior tibial pulses are full and equal bilaterally Extremities:  No clubbing, cyanosis, edema, or deformity noted with normal full range of motion of all joints.   Neurologic:  alert & oriented X3 and cranial nerves II-XII intact.     Impression & Recommendations:  Problem # 1:  DIABETES MELLITUS, CONTROLLED (ICD-250.00) Assessment Improved  His updated medication list for this problem includes:    Benicar Hct 20-12.5 Mg Tabs (Olmesartan medoxomil-hctz) .Marland Kitchen... 1 once daily hold    Onglyza 5 Mg Tabs (Saxagliptin hcl) ..... One  by mouth daily  Labs Reviewed: Creat: 1.0 (12/18/2008)    Reviewed HgBA1c results: 6.7 (05/28/2009)  6.8 (02/26/2009)  Problem # 2:  HYPERTENSION (ICD-401.9) Assessment: Unchanged  His updated medication list for this problem includes:    Benicar Hct 20-12.5 Mg Tabs (Olmesartan medoxomil-hctz) .Marland Kitchen... 1 once daily hold  BP today: 110/70 Prior BP: 130/80 (03/05/2009)  10 Yr Risk Heart Disease: 7 % Prior 10 Yr Risk Heart Disease: 22 % (03/05/2009)  Labs Reviewed: K+: 3.8 (12/18/2008) Creat: : 1.0 (12/18/2008)   Chol: 146 (05/28/2009)   HDL: 46.70 (  05/28/2009)   LDL: 82 (05/28/2009)   TG: 89.0 (05/28/2009)  Problem # 3:  GERD (ICD-530.81)  His updated medication list for this problem includes:    Nexium 40 Mg Cpdr (Esomeprazole magnesium) ..... One by mouth bid  EGD: Location: [facility]   (02/21/2008)  Labs Reviewed: Hgb: 13.0 (12/18/2008)   Hct: 37.9 (12/18/2008)  Complete Medication List: 1)  Acular 0.5 % Soln (Ketorolac tromethamine) .... Use as directed 2)  Benicar Hct 20-12.5 Mg Tabs (Olmesartan medoxomil-hctz) .Marland Kitchen.. 1 once daily hold 3)  Crestor 10 Mg Tabs (Rosuvastatin calcium) .Marland Kitchen.. 1  once daily 4)  Flomax 0.4 Mg Caps (Tamsulosin hcl) .Marland Kitchen.. 1 once daily 5)  Temazepam 30 Mg Caps (Temazepam) .Marland Kitchen.. 1 at bedtime as needed 6)  Testim 1 % Gel (Testosterone) .... 2 once daily 7)  Alprazolam 0.5 Mg Tabs (Alprazolam) .... One by mouth three times a day prn 8)  Ambien 10 Mg Tabs (Zolpidem tartrate) .... One hs 9)  Nexium 40 Mg Cpdr (Esomeprazole magnesium) .... One by mouth bid 10)  Onglyza 5 Mg Tabs (Saxagliptin hcl) .... One  by mouth daily  Hypertension Assessment/Plan:      The patient's hypertensive risk group is category C: Target organ damage and/or diabetes.  His calculated 10 year risk of coronary heart disease is 7 %.  Today's blood pressure is 110/70.  His blood pressure goal is < 140/90.

## 2010-04-16 NOTE — Letter (Signed)
Summary: Patient Notice- Polyp Results  [facility]  [address] [place], [place] [address]   Phone: [phone]  Fax: [phone]        April 26, 2009 MRN: [id number]    [facility] [address] [place], [place]  [address]    Dear Mr. Gehring,  I am pleased to inform you that the colon polyp(s) removed during your recent colonoscopy was (were) found to be benign (no cancer detected) upon pathologic examination.  I recommend you have a repeat colonoscopy examination in 3 years to look for recurrent polyps, as having colon polyps increases your risk for having recurrent polyps or even colon cancer in the future.  Should you develop new or worsening symptoms of abdominal pain, bowel habit changes or bleeding from the rectum or bowels, please schedule an evaluation with either your primary care physician or with me.  Additional information/recommendations:  __ No further action with gastroenterology is needed at this time. Please      follow-up with your primary care physician for your other healthcare      needs.   Please call us if you are having persistent problems or have questions about your condition that have not been fully answered at this time.  Sincerely,  Hilarie Fredrickson MD  This letter has been electronically signed by your physician.  Appended Document: Patient Notice- Polyp Results Letter mailed 2.15.11

## 2010-04-16 NOTE — Assessment & Plan Note (Signed)
Summary: 3 month fup//ccm   Vital Signs:  Patient profile:   60 year old male Height:      75 inches Weight:      302 pounds BMI:     37.88 Temp:     98.2 degrees F oral Pulse rate:   76 / minute Resp:     14 per minute BP sitting:   110 / 70  (left arm) Cuff size:   large  Vitals Entered By: Willy Eddy, LPN (September 03, 2009 10:31 AM) CC: roa- labs   Primary Care Provider:  Stacie Glaze MD  CC:  roa- labs.  History of Present Illness: follow up weight and DM with hx of elevated A1c  HTN and lipids mood disorder  Hypertension Follow-Up      This is a 60 year old man who presents for Hypertension follow-up.  The patient denies lightheadedness, urinary frequency, headaches, edema, impotence, rash, and fatigue.  The patient denies the following associated symptoms: chest pain, chest pressure, exercise intolerance, dyspnea, palpitations, syncope, leg edema, and pedal edema.  Compliance with medications (by patient report) has been near 100%.  The patient reports that dietary compliance has been excellent.  The patient reports exercising daily.  Adjunctive measures currently used by the patient include salt restriction.     Preventive Screening-Counseling & Management  Alcohol-Tobacco     Smoking Status: quit  Problems Prior to Update: 1)  Plantar Wart  (ICD-078.12) 2)  Abdominal Pain, Right Upper Quadrant  (ICD-789.01) 3)  Chest Pain, Intermittent  (ICD-786.50) 4)  Diabetes Mellitus, Controlled  (ICD-250.00) 5)  Physical Examination  (ICD-V70.0) 6)  Insomnia  (ICD-780.52) 7)  Hypertension  (ICD-401.9) 8)  Hyperlipidemia  (ICD-272.4) 9)  Gerd  (ICD-530.81) 10)  Depression  (ICD-311) 11)  Prostate Cancer, Hx of  (ICD-V10.46) 12)  Allergic Rhinitis  (ICD-477.9)  Current Problems (verified): 1)  Plantar Wart  (ICD-078.12) 2)  Abdominal Pain, Right Upper Quadrant  (ICD-789.01) 3)  Chest Pain, Intermittent  (ICD-786.50) 4)  Diabetes Mellitus, Controlled   (ICD-250.00) 5)  Physical Examination  (ICD-V70.0) 6)  Insomnia  (ICD-780.52) 7)  Hypertension  (ICD-401.9) 8)  Hyperlipidemia  (ICD-272.4) 9)  Gerd  (ICD-530.81) 10)  Depression  (ICD-311) 11)  Prostate Cancer, Hx of  (ICD-V10.46) 12)  Allergic Rhinitis  (ICD-477.9)  Medications Prior to Update: 1)  Acular 0.5 % Soln (Ketorolac Tromethamine) .... Use As Directed 2)  Benicar Hct 20-12.5 Mg Tabs (Olmesartan Medoxomil-Hctz) .Marland Kitchen.. 1 Once Daily Hold 3)  Crestor 10 Mg Tabs (Rosuvastatin Calcium) .Marland Kitchen.. 1 Once Daily 4)  Flomax 0.4 Mg Caps (Tamsulosin Hcl) .Marland Kitchen.. 1 Once Daily 5)  Temazepam 30 Mg Caps (Temazepam) .Marland Kitchen.. 1 At Bedtime As Needed 6)  Testim 1 % Gel (Testosterone) .... 2 Once Daily 7)  Alprazolam 0.5 Mg Tabs (Alprazolam) .... One By Mouth Three Times A Day Prn 8)  Ambien 10 Mg Tabs (Zolpidem Tartrate) .... One Hs 9)  Nexium 40 Mg Cpdr (Esomeprazole Magnesium) .... One By Mouth Bid 10)  Onglyza 5 Mg Tabs (Saxagliptin Hcl) .... One  By Mouth Daily  Current Medications (verified): 1)  Acular 0.5 % Soln (Ketorolac Tromethamine) .... Use As Directed 2)  Benicar Hct 20-12.5 Mg Tabs (Olmesartan Medoxomil-Hctz) .Marland Kitchen.. 1 Once Daily Hold 3)  Crestor 10 Mg Tabs (Rosuvastatin Calcium) .Marland Kitchen.. 1 Once Daily 4)  Flomax 0.4 Mg Caps (Tamsulosin Hcl) .Marland Kitchen.. 1 Once Daily 5)  Temazepam 30 Mg Caps (Temazepam) .Marland Kitchen.. 1 At Bedtime As Needed 6)  Testim 1 % Gel (Testosterone) .... 2 Once Daily 7)  Alprazolam 0.5 Mg Tabs (Alprazolam) .... One By Mouth Three Times A Day Prn 8)  Ambien 10 Mg Tabs (Zolpidem Tartrate) .... One Hs 9)  Nexium 40 Mg Cpdr (Esomeprazole Magnesium) .... One By Mouth Bid 10)  Onglyza 5 Mg Tabs (Saxagliptin Hcl) .... One  By Mouth Daily  Allergies (verified): 1)  ! * Ivp Dye  Past History:  Family History: Last updated: 09/30/2006 Fam hx MI Family History Other cancer-Prostate Fam hx Leukemia  Social History: Last updated: 09/30/2006 Former Smoker Alcohol use-no Drug  use-no Married  Risk Factors: Exercise: no (03/05/2009)  Risk Factors: Smoking Status: quit (09/03/2009)  Past medical, surgical, family and social histories (including risk factors) reviewed, and no changes noted (except as noted below).  Past Medical History: Reviewed history from 09/30/2006 and no changes required. Allergic rhinitis Prostate cancer, hx of Depression GERD Hyperlipidemia Hypertension  Past Surgical History: Reviewed history from 09/30/2006 and no changes required. Colonoscopy-04/10/2006  Family History: Reviewed history from 09/30/2006 and no changes required. Fam hx MI Family History Other cancer-Prostate Fam hx Leukemia  Social History: Reviewed history from 09/30/2006 and no changes required. Former Smoker Alcohol use-no Drug use-no Married  Review of Systems  The patient denies anorexia, fever, weight loss, weight gain, vision loss, decreased hearing, hoarseness, chest pain, syncope, dyspnea on exertion, peripheral edema, prolonged cough, headaches, hemoptysis, abdominal pain, melena, hematochezia, severe indigestion/heartburn, hematuria, incontinence, genital sores, muscle weakness, suspicious skin lesions, transient blindness, difficulty walking, depression, unusual weight change, abnormal bleeding, enlarged lymph nodes, angioedema, and breast masses.    Physical Exam  General:  well-developed and overweight-appearing.   Head:  Normocephalic and atraumatic without obvious abnormalities. No apparent alopecia or balding. Eyes:  pupils equal, pupils round, and pupils reactive to light.   Ears:  R ear normal and L ear normal.   Nose:  no external deformity and no nasal discharge.   Mouth:  pharynx pink and moist and no erythema.   Neck:  No deformities, masses, or tenderness noted. Lungs:  Normal respiratory effort, chest expands symmetrically. Lungs are clear to auscultation, no crackles or wheezes. Heart:  Normal rate and regular rhythm. S1 and  S2 normal without gallop, murmur, click, rub or other extra sounds. Abdomen:  Bowel sounds positive,abdomen soft and non-tender without masses, organomegaly or hernias noted. Msk:  No deformity or scoliosis noted of thoracic or lumbar spine.   Pulses:  R and L carotid,radial,femoral,dorsalis pedis and posterior tibial pulses are full and equal bilaterally Extremities:  No clubbing, cyanosis, edema, or deformity noted with normal full range of motion of all joints.   Neurologic:  alert & oriented X3 and cranial nerves II-XII intact.     Impression & Recommendations:  Problem # 1:  DIABETES MELLITUS, CONTROLLED (ICD-250.00) the pt has been dieting but there has been fluxuation in the weight His updated medication list for this problem includes:    Benicar Hct 20-12.5 Mg Tabs (Olmesartan medoxomil-hctz) .Marland Kitchen... 1 once daily hold    Onglyza 5 Mg Tabs (Saxagliptin hcl) ..... One  by mouth daily carbs have increased and sugar increased  Labs Reviewed: Creat: 1.0 (12/18/2008)    Reviewed HgBA1c results: 7.2 (08/27/2009)  6.7 (05/28/2009)  Problem # 2:  HYPERTENSION (ICD-401.9)  His updated medication list for this problem includes:    Benicar Hct 20-12.5 Mg Tabs (Olmesartan medoxomil-hctz) .Marland Kitchen... 1 once daily hold  BP today: 110/70 Prior BP: 110/70 (06/04/2009)  Prior 10  Yr Risk Heart Disease: 7 % (06/04/2009)  Labs Reviewed: K+: 3.8 (12/18/2008) Creat: : 1.0 (12/18/2008)   Chol: 146 (05/28/2009)   HDL: 46.70 (05/28/2009)   LDL: 82 (05/28/2009)   TG: 89.0 (05/28/2009)  Complete Medication List: 1)  Acular 0.5 % Soln (Ketorolac tromethamine) .... Use as directed 2)  Benicar Hct 20-12.5 Mg Tabs (Olmesartan medoxomil-hctz) .Marland Kitchen.. 1 once daily hold 3)  Crestor 10 Mg Tabs (Rosuvastatin calcium) .Marland Kitchen.. 1 once daily 4)  Flomax 0.4 Mg Caps (Tamsulosin hcl) .Marland Kitchen.. 1 once daily 5)  Temazepam 30 Mg Caps (Temazepam) .Marland Kitchen.. 1 at bedtime as needed 6)  Testim 1 % Gel (Testosterone) .... 2 once daily 7)   Alprazolam 0.5 Mg Tabs (Alprazolam) .... One by mouth three times a day prn 8)  Ambien 10 Mg Tabs (Zolpidem tartrate) .... One hs 9)  Nexium 40 Mg Cpdr (Esomeprazole magnesium) .... One by mouth bid 10)  Onglyza 5 Mg Tabs (Saxagliptin hcl) .... One  by mouth daily  Patient Instructions: 1)  use the 100 cal cookie packs as snaks not sugar free! 2)  Please schedule a follow-up appointment in 3 months. 3)  HbgA1C prior to visit, ICD-9:250.00

## 2010-04-16 NOTE — Letter (Signed)
Summary: Moviprep Instructions  [facility]  [address]   [place], [place] [address]   Phone: [phone]  Fax: [phone]       Alejandro Koch    Apr 17, 1950    MRN: [id number]        Procedure Day /Date: 04-25-09 Wednesday     Arrival Time: 8:30 a.m.     Procedure Time: 9:30 a.m.     Location of Procedure:                    _x _  [facility] (4th Floor)   PREPARATION FOR COLONOSCOPY WITH MOVIPREP   Starting 5 days prior to your procedure 04-20-09  do not eat nuts, seeds, popcorn, corn, beans, peas,  salads, or any raw vegetables.  Do not take any fiber supplements (e.g. Metamucil, Citrucel, and Benefiber).  THE DAY BEFORE YOUR PROCEDURE         DATE:  04-24-09   DAY: Tuesday   1.  Drink clear liquids the entire day-NO SOLID FOOD  2.  Do not drink anything colored red or purple.  Avoid juices with pulp.  No orange juice.  3.  Drink at least 64 oz. (8 glasses) of fluid/clear liquids during the day to prevent dehydration and help the prep work efficiently.  CLEAR LIQUIDS INCLUDE: Water Jello Ice Popsicles Tea (sugar ok, no milk/cream) Powdered fruit flavored drinks Coffee (sugar ok, no milk/cream) Gatorade Juice: apple, white grape, white cranberry  Lemonade Clear bullion, consomm, broth Carbonated beverages (any kind) Strained chicken noodle soup Hard Candy                             4.  In the morning, mix first dose of MoviPrep solution:    Empty 1 Pouch A and 1 Pouch B into the disposable container    Add lukewarm drinking water to the top line of the container. Mix to dissolve    Refrigerate (mixed solution should be used within 24 hrs)  5.  Begin drinking the prep at 5:00 p.m. The MoviPrep container is divided by 4 marks.   Every 15 minutes drink the solution down to the next mark (approximately 8 oz) until the full liter is complete.   6.  Follow completed prep with 16 oz of clear liquid of your choice (Nothing red or  purple).  Continue to drink clear liquids until bedtime.  7.  Before going to bed, mix second dose of MoviPrep solution:    Empty 1 Pouch A and 1 Pouch B into the disposable container    Add lukewarm drinking water to the top line of the container. Mix to dissolve    Refrigerate  THE DAY OF YOUR PROCEDURE      DATE: 04-25-09   DAY: Wednesday  Beginning at 4:30 a.m. (5 hours before procedure):         1. Every 15 minutes, drink the solution down to the next mark (approx 8 oz) until the full liter is complete.  2. Follow completed prep with 16 oz. of clear liquid of your choice.    3. You may drink clear liquids until 7:30 a.m.   (2 HOURS BEFORE PROCEDURE).   MEDICATION INSTRUCTIONS  Unless otherwise instructed, you should take regular prescription medications with a small sip of water   as early as possible the morning of your procedure.  Hold:On 04-25-09, Hold Benicar HCT until after procedure  OTHER INSTRUCTIONS  You will need a responsible adult at least 60 years of age to accompany you and drive you home.   This person must remain in the waiting room during your procedure.  Wear loose fitting clothing that is easily removed.  Leave jewelry and other valuables at home.  However, you may wish to bring a book to read or  an iPod/MP3 player to listen to music as you wait for your procedure to start.  Remove all body piercing jewelry and leave at home.  Total time from sign-in until discharge is approximately 2-3 hours.  You should go home directly after your procedure and rest.  You can resume normal activities the  day after your procedure.  The day of your procedure you should not:   Drive   Make legal decisions   Operate machinery   Drink alcohol   Return to work  You will receive specific instructions about eating, activities and medications before you leave.    The above instructions have been reviewed and explained to me by   Karl Bales  RN  April 12, 2009 2:53 PM    I fully understand and can verbalize these instructions _____________________________ Date _________

## 2010-04-18 NOTE — Assessment & Plan Note (Signed)
Summary: 3 month rov/njr   Vital Signs:  Patient profile:   60 year old male Height:      75 inches Weight:      306 pounds BMI:     38.39 Temp:     98.2 degrees F oral Pulse rate:   72 / minute Resp:     14 per minute BP sitting:   130 / 72  (left arm) Cuff size:   large  Vitals Entered By: Willy Eddy, LPN (March 01, 2010 8:29 AM) CC: roa labs, Hypertension Management Is Patient Diabetic? No   Primary Care Provider:  Stacie Glaze MD  CC:  roa labs and Hypertension Management.  History of Present Illness: monitering of blood glucoses  the pt has lost some weight has has a plan for exercize but has not realized it OBESE HX OF FATTY LIVER   Hypertension History:      He denies headache, chest pain, palpitations, dyspnea with exertion, orthopnea, PND, peripheral edema, visual symptoms, neurologic problems, syncope, and side effects from treatment.        Positive major cardiovascular risk factors include male age 48 years old or older, diabetes, hyperlipidemia, and hypertension.  Negative major cardiovascular risk factors include non-tobacco-user status.     Preventive Screening-Counseling & Management  Alcohol-Tobacco     Smoking Status: quit  Problems Prior to Update: 1)  Abdominal Pain Right Upper Quadrant  (ICD-789.01) 2)  Hypogonadism  (ICD-257.2) 3)  Hamstring Tendinitis  (ICD-726.90) 4)  Plantar Wart  (ICD-078.12) 5)  Abdominal Pain, Right Upper Quadrant  (ICD-789.01) 6)  Chest Pain, Intermittent  (ICD-786.50) 7)  Diabetes Mellitus, Controlled  (ICD-250.00) 8)  Physical Examination  (ICD-V70.0) 9)  Insomnia  (ICD-780.52) 10)  Hypertension  (ICD-401.9) 11)  Hyperlipidemia  (ICD-272.4) 12)  Gerd  (ICD-530.81) 13)  Depression  (ICD-311) 14)  Prostate Cancer, Hx of  (ICD-V10.46) 15)  Allergic Rhinitis  (ICD-477.9)  Current Problems (verified): 1)  Abdominal Pain Right Upper Quadrant  (ICD-789.01) 2)  Hypogonadism  (ICD-257.2) 3)  Hamstring  Tendinitis  (ICD-726.90) 4)  Plantar Wart  (ICD-078.12) 5)  Abdominal Pain, Right Upper Quadrant  (ICD-789.01) 6)  Chest Pain, Intermittent  (ICD-786.50) 7)  Diabetes Mellitus, Controlled  (ICD-250.00) 8)  Physical Examination  (ICD-V70.0) 9)  Insomnia  (ICD-780.52) 10)  Hypertension  (ICD-401.9) 11)  Hyperlipidemia  (ICD-272.4) 12)  Gerd  (ICD-530.81) 13)  Depression  (ICD-311) 14)  Prostate Cancer, Hx of  (ICD-V10.46) 15)  Allergic Rhinitis  (ICD-477.9)  Medications Prior to Update: 1)  Acular 0.5 % Soln (Ketorolac Tromethamine) .... Use As Directed 2)  Benicar Hct 20-12.5 Mg Tabs (Olmesartan Medoxomil-Hctz) .Marland Kitchen.. 1 Once Daily in The Am 3)  Crestor 10 Mg Tabs (Rosuvastatin Calcium) .Marland Kitchen.. 1 Once Daily 4)  Flomax 0.4 Mg Caps (Tamsulosin Hcl) .Marland Kitchen.. 1 Once Daily 5)  Temazepam 30 Mg Caps (Temazepam) .Marland Kitchen.. 1 At Bedtime As Needed 6)  Testim 50 Mg/5gm Gel (Testosterone) .Marland Kitchen.. 1 Pack Every Other Day and 1.5 Pack Every Other Day Alternating 7)  Alprazolam 0.5 Mg Tabs (Alprazolam) .... One By Mouth Three Times A Day Prn 8)  Ambien 10 Mg Tabs (Zolpidem Tartrate) .... One Hs 9)  Nexium 40 Mg Cpdr (Esomeprazole Magnesium) .... One By Mouth Bid 10)  Onglyza 5 Mg Tabs (Saxagliptin Hcl) .... One  By Mouth Daily  Current Medications (verified): 1)  Acular 0.5 % Soln (Ketorolac Tromethamine) .... Use As Directed 2)  Benicar Hct 20-12.5 Mg Tabs (Olmesartan Medoxomil-Hctz) .Marland KitchenMarland KitchenMarland Kitchen  1 Once Daily in The Am 3)  Crestor 10 Mg Tabs (Rosuvastatin Calcium) .Marland Kitchen.. 1 Once Daily 4)  Flomax 0.4 Mg Caps (Tamsulosin Hcl) .Marland Kitchen.. 1 Once Daily 5)  Temazepam 30 Mg Caps (Temazepam) .Marland Kitchen.. 1 At Bedtime As Needed 6)  Testim 50 Mg/5gm Gel (Testosterone) .Marland Kitchen.. 1 Pack Every Other Day and 1.5 Pack Every Other Day Alternating 7)  Alprazolam 0.5 Mg Tabs (Alprazolam) .... One By Mouth Three Times A Day Prn 8)  Ambien 10 Mg Tabs (Zolpidem Tartrate) .... One Hs 9)  Nexium 40 Mg Cpdr (Esomeprazole Magnesium) .... One By Mouth Bid 10)   Kombiglyze Xr 07-998 Mg Xr24h-Tab (Saxagliptin-Metformin) .... One By Mouth Daily  Allergies (verified): 1)  ! * Ivp Dye  Past History:  Family History: Last updated: 09/30/2006 Fam hx MI Family History Other cancer-Prostate Fam hx Leukemia  Social History: Last updated: 09/30/2006 Former Smoker Alcohol use-no Drug use-no Married  Risk Factors: Exercise: no (03/05/2009)  Risk Factors: Smoking Status: quit (03/01/2010)  Past medical, surgical, family and social histories (including risk factors) reviewed, and no changes noted (except as noted below).  Past Medical History: Reviewed history from 09/30/2006 and no changes required. Allergic rhinitis Prostate cancer, hx of Depression GERD Hyperlipidemia Hypertension  Past Surgical History: Reviewed history from 09/30/2006 and no changes required. Colonoscopy-04/10/2006  Family History: Reviewed history from 09/30/2006 and no changes required. Fam hx MI Family History Other cancer-Prostate Fam hx Leukemia  Social History: Reviewed history from 09/30/2006 and no changes required. Former Smoker Alcohol use-no Drug use-no Married  Review of Systems  The patient denies anorexia, fever, weight loss, weight gain, vision loss, decreased hearing, hoarseness, chest pain, syncope, dyspnea on exertion, peripheral edema, prolonged cough, headaches, hemoptysis, abdominal pain, melena, hematochezia, severe indigestion/heartburn, hematuria, incontinence, genital sores, muscle weakness, suspicious skin lesions, transient blindness, difficulty walking, depression, unusual weight change, abnormal bleeding, enlarged lymph nodes, angioedema, and breast masses.    Physical Exam  General:  well-developed and overweight-appearing.   Head:  Normocephalic and atraumatic without obvious abnormalities. No apparent alopecia or balding. Eyes:  pupils equal, pupils round, and pupils reactive to light.   Ears:  R ear normal and L ear  normal.   Mouth:  pharynx pink and moist and no erythema.   Neck:  No deformities, masses, or tenderness noted. Lungs:  Normal respiratory effort, chest expands symmetrically. Lungs are clear to auscultation, no crackles or wheezes. Heart:  Normal rate and regular rhythm. S1 and S2 normal without gallop, murmur, click, rub or other extra sounds. Abdomen:  Bowel sounds positive,abdomen soft and non-tender without masses, organomegaly or hernias noted. Msk:  No deformity or scoliosis noted of thoracic or lumbar spine.   Pulses:  R and L carotid,radial,femoral,dorsalis pedis and posterior tibial pulses are full and equal bilaterally Extremities:  No clubbing, cyanosis, edema, or deformity noted with normal full range of motion of all joints.   Neurologic:  alert & oriented X3 and cranial nerves II-XII intact.     Impression & Recommendations:  Problem # 1:  DIABETES MELLITUS, CONTROLLED (ICD-250.00)  His updated medication list for this problem includes:    Benicar Hct 20-12.5 Mg Tabs (Olmesartan medoxomil-hctz) .Marland Kitchen... 1 once daily in the am    Kombiglyze Xr 07-998 Mg Xr24h-tab (Saxagliptin-metformin) ..... One by mouth daily  Labs Reviewed: Creat: 1.0 (12/18/2008)    Reviewed HgBA1c results: 7.2 (02/22/2010)  7.8 (12/03/2009)  Orders: UA Dipstick w/o Micro (automated)  (81003)  Problem # 2:  HYPOGONADISM (ICD-257.2)  Orders: Venipuncture (16109) TLB-Testosterone, Total (84403-TESTO)  Problem # 3:  ABDOMINAL PAIN RIGHT UPPER QUADRANT (ICD-789.01)  Orders: Radiology Referral (Radiology) TLB-BMP (Basic Metabolic Panel-BMET) (80048-METABOL)  Discussed symptom control with the patient.   Problem # 4:  FATTY LIVER DISEASE (ICD-571.8) THIS IS THE POSSIBLE CAUSE OF THE PERSISTANT OAIN IN THE RIGHT UPPER QUANDRANT  Complete Medication List: 1)  Acular 0.5 % Soln (Ketorolac tromethamine) .... Use as directed 2)  Benicar Hct 20-12.5 Mg Tabs (Olmesartan medoxomil-hctz) .Marland Kitchen.. 1 once  daily in the am 3)  Crestor 10 Mg Tabs (Rosuvastatin calcium) .Marland Kitchen.. 1 once daily 4)  Flomax 0.4 Mg Caps (Tamsulosin hcl) .Marland Kitchen.. 1 once daily 5)  Temazepam 30 Mg Caps (Temazepam) .Marland Kitchen.. 1 at bedtime as needed 6)  Testim 50 Mg/5gm Gel (Testosterone) .Marland Kitchen.. 1 pack every other day and 1.5 pack every other day alternating 7)  Alprazolam 0.5 Mg Tabs (Alprazolam) .... One by mouth three times a day prn 8)  Ambien 10 Mg Tabs (Zolpidem tartrate) .... One hs 9)  Nexium 40 Mg Cpdr (Esomeprazole magnesium) .... One by mouth bid 10)  Kombiglyze Xr 07-998 Mg Xr24h-tab (Saxagliptin-metformin) .... One by mouth daily  Hypertension Assessment/Plan:      The patient's hypertensive risk group is category C: Target organ damage and/or diabetes.  His calculated 10 year risk of coronary heart disease is 9 %.  Today's blood pressure is 130/72.  His blood pressure goal is < 140/90.  Patient Instructions: 1)  Please schedule a follow-up appointment in 3 months. 2)  CONSIDER THE OPTIFAST SYSTEM   Orders Added: 1)  Venipuncture [60454] 2)  TLB-Testosterone, Total [84403-TESTO] 3)  Est. Patient Level IV [09811] 4)  UA Dipstick w/o Micro (automated)  [81003] 5)  Radiology Referral [Radiology] 6)  TLB-BMP (Basic Metabolic Panel-BMET) [80048-METABOL]  Appended Document: Orders Update     Clinical Lists Changes  Orders: Added new Service order of Specimen Handling (91478) - Signed      Appended Document: 3 month rov/njr  Laboratory Results   Urine Tests    Routine Urinalysis   Color: yellow Appearance: Clear Glucose: 1+   (Normal Range: Negative) Bilirubin: negative   (Normal Range: Negative) Ketone: negative   (Normal Range: Negative) Spec. Gravity: 1.025   (Normal Range: 1.003-1.035) Blood: negative   (Normal Range: Negative) pH: 6.0   (Normal Range: 5.0-8.0) Protein: negative   (Normal Range: Negative) Urobilinogen: 0.2   (Normal Range: 0-1) Nitrite: negative   (Normal Range:  Negative) Leukocyte Esterace: negative   (Normal Range: Negative)    Comments: Rita Ohara  March 01, 2010 4:25 PM

## 2010-06-01 LAB — DIFFERENTIAL
Basophils Absolute: 0 10*3/uL (ref 0.0–0.1)
Eosinophils Relative: 0 % (ref 0–5)
Lymphocytes Relative: 11 % — ABNORMAL LOW (ref 12–46)
Neutro Abs: 7.5 10*3/uL (ref 1.7–7.7)

## 2010-06-01 LAB — CBC
HCT: 32.5 % — ABNORMAL LOW (ref 39.0–52.0)
Platelets: 168 10*3/uL (ref 150–400)
RDW: 13.6 % (ref 11.5–15.5)

## 2010-06-01 LAB — POCT I-STAT, CHEM 8
BUN: 10 mg/dL (ref 6–23)
Sodium: 136 mEq/L (ref 135–145)
TCO2: 24 mmol/L (ref 0–100)

## 2010-06-14 ENCOUNTER — Telehealth: Payer: Self-pay | Admitting: *Deleted

## 2010-06-14 DIAGNOSIS — G4700 Insomnia, unspecified: Secondary | ICD-10-CM

## 2010-06-14 MED ORDER — TEMAZEPAM 30 MG PO CAPS: 30.0000 mg | ORAL_CAPSULE | Freq: Every evening | ORAL | Status: DC | PRN

## 2010-06-14 NOTE — Telephone Encounter (Signed)
done

## 2010-06-17 LAB — URINALYSIS, ROUTINE W REFLEX MICROSCOPIC
Glucose, UA: 100 mg/dL — AB
Nitrite: POSITIVE — AB
Protein, ur: 30 mg/dL — AB
Urobilinogen, UA: 1 mg/dL (ref 0.0–1.0)

## 2010-06-17 LAB — URINE MICROSCOPIC-ADD ON

## 2010-06-26 ENCOUNTER — Other Ambulatory Visit: Payer: Self-pay | Admitting: Internal Medicine

## 2010-07-24 ENCOUNTER — Ambulatory Visit (INDEPENDENT_AMBULATORY_CARE_PROVIDER_SITE_OTHER): Payer: BC Managed Care – PPO | Admitting: Internal Medicine

## 2010-07-24 ENCOUNTER — Other Ambulatory Visit: Payer: Self-pay | Admitting: *Deleted

## 2010-07-24 ENCOUNTER — Encounter: Payer: Self-pay | Admitting: Internal Medicine

## 2010-07-24 VITALS — BP 130/80 | HR 76 | Temp 98.2°F | Resp 16 | Ht 75.0 in | Wt 302.0 lb

## 2010-07-24 DIAGNOSIS — E785 Hyperlipidemia, unspecified: Secondary | ICD-10-CM

## 2010-07-24 DIAGNOSIS — R109 Unspecified abdominal pain: Secondary | ICD-10-CM

## 2010-07-24 DIAGNOSIS — K7689 Other specified diseases of liver: Secondary | ICD-10-CM

## 2010-07-24 DIAGNOSIS — R1011 Right upper quadrant pain: Secondary | ICD-10-CM

## 2010-07-24 DIAGNOSIS — E291 Testicular hypofunction: Secondary | ICD-10-CM

## 2010-07-24 DIAGNOSIS — I10 Essential (primary) hypertension: Secondary | ICD-10-CM

## 2010-07-24 DIAGNOSIS — K219 Gastro-esophageal reflux disease without esophagitis: Secondary | ICD-10-CM

## 2010-07-24 LAB — CBC WITH DIFFERENTIAL/PLATELET
Basophils Absolute: 0 10*3/uL (ref 0.0–0.1)
Basophils Relative: 0.4 % (ref 0.0–3.0)
Eosinophils Absolute: 0 10*3/uL (ref 0.0–0.7)
MCHC: 34.8 g/dL (ref 30.0–36.0)
MCV: 87.6 fl (ref 78.0–100.0)
Monocytes Absolute: 0.3 10*3/uL (ref 0.1–1.0)
Neutro Abs: 2.9 10*3/uL (ref 1.4–7.7)
Neutrophils Relative %: 55.5 % (ref 43.0–77.0)
RBC: 4.51 Mil/uL (ref 4.22–5.81)
RDW: 14.4 % (ref 11.5–14.6)

## 2010-07-24 LAB — HEPATIC FUNCTION PANEL
Albumin: 4.1 g/dL (ref 3.5–5.2)
Bilirubin, Direct: 0.1 mg/dL (ref 0.0–0.3)
Total Protein: 7.1 g/dL (ref 6.0–8.3)

## 2010-07-24 NOTE — Assessment & Plan Note (Signed)
Related to obesity

## 2010-07-24 NOTE — Assessment & Plan Note (Signed)
The pt is seen by urology but the last testosterone by urology was greater that 1000 andthe dose was maintained.

## 2010-07-24 NOTE — Progress Notes (Signed)
  Subjective:    Patient ID: Alejandro Koch, male    DOB: 25-Sep-1950, 60 y.o.   MRN: [id number]  HPI Right flank pain. Hx of fattyt liver. Obese. No constipation. CT in 11/11 negative. The [place] was fatty liver. CBC and lfts were normal. No rash Hx of prostate CA   Review of Systems  Constitutional: Positive for fatigue. Negative for fever.       [obese HENT: Negative for hearing loss, congestion, neck pain and postnasal drip.   Eyes: Negative for discharge, redness and visual disturbance.  Respiratory: Negative for cough, shortness of breath and wheezing.   Cardiovascular: Negative for leg swelling.  Gastrointestinal: Positive for abdominal pain. Negative for constipation and abdominal distention.  Genitourinary: Negative for urgency and frequency.  Musculoskeletal: Negative for joint swelling and arthralgias.  Skin: Negative for color change and rash.  Neurological: Negative for weakness and light-headedness.  Hematological: Negative for adenopathy.  Psychiatric/Behavioral: Negative for behavioral problems.   Past Medical History  Diagnosis Date  . Allergy   . History of prostate cancer   . Depression   . GERD (gastroesophageal reflux disease)   . Hyperlipidemia   . Hypertension    Past Surgical History  Procedure Date  . Colonoscopy 2008    reports that he quit smoking about 27 years ago. He does not have any smokeless tobacco history on file. He reports that he does not drink alcohol or use illicit drugs. family history includes Alzheimer's disease in his mother; COPD in his father; Heart disease in his father; and Leukemia in an unspecified family member. Allergies  Allergen Reactions  . Iohexol      Code: HIVES, Desc: PT IS ALLERGIC TO IVP DYE AS NOTED IN PREVIOUS RECORDS IN PACS 02/2009/RM, Onset Date: [id number]        Objective:   Physical Exam  Constitutional: He appears well-developed and well-nourished.  HENT:  Head: Normocephalic and atraumatic.  Eyes:  Conjunctivae are normal. Pupils are equal, round, and reactive to light.  Neck: Normal range of motion. Neck supple.  Cardiovascular: Normal rate and regular rhythm.   Pulmonary/Chest: Effort normal and breath sounds normal.  Abdominal: Soft. Bowel sounds are normal. He exhibits no mass. There is tenderness. There is no guarding.          Assessment & Plan:

## 2010-07-24 NOTE — Assessment & Plan Note (Addendum)
Worsening pain in the right side has been previously attributed to fatty liver.  We have talked about sustaining weight reduction as treatment for fatty liver.  CT scan in November did not show any focal abnormalities in the right upper quadrant of the abdomen. The ultrasound done prior to the CT did show steatosis. Rib details to r/o lytic changes with hx of prostate CA?

## 2010-07-30 NOTE — Letter (Signed)
May 21, 2007    Brett Canales A. Cleta Alberts, M.D.  [address]  [place], [place] [address]   RE:  TEDD, COTTRILL  MRN:  [id number]  /  DOB:  Aug 01, 1950   Dear Dr. Cleta Alberts:   Thank you for referring Mr. Coots for further evaluation.  He is a 60-  year-old obese male with a history of hypertension, hyperlipidemia, and  previous reassuring cardiac evaluation including a cardiac  catheterization back in 1999 demonstrating essentially normal coronary  arteries.  In addition to this he has a history of previously diagnosed  reflux and esophagitis, followed by Dr. Marina Goodell.  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  He reports having four separate  episodes in the last 2 months of a tightness in his chest associated  ultimately with lightheadedness although no frank palpitations or  syncope.  He states that these episodes feel different from his typical  reflux.  He does not report any of these symptoms occurring specifically  with exertion, however.  When he walks at work, such as going up 2 or 3  flights of steps, he reports that he is very breathless at the top and  has to stop and catch his breath before speaking to someone, although he  is able to do the entire course of the stairs without stopping.  He has  not had any follow-up ischemic testing since 2006 at which time he  underwent an exercise Myoview which revealed no frank evidence of  ischemia and probable inferior attenuation with an ejection fraction of  53%.  Today's electrocardiogram shows sinus rhythm.   ALLERGIES:  INTRAVENOUS CONTRAST   Present medications include Cardura 4 mg p.o. daily, Crestor 10 mg p.o.  daily, Testim 50 mg daily, Nexium 40 mg p.o. daily., alprazolam 0.25 mg  p.o. daily, Benicar/HCT 20/12.5 mg p.o. daily, enteric-coated aspirin  325 mg p.o. daily.   Past medical history is discussed above.  The patient is a prior history  of  adenomatous colonic polyps and has screening colonoscopy every 3  years.  No other major reported surgeries.   SOCIAL HISTORY:  The patient is married.  He has one child.  He works in  Probation officer.  Has a prior tobacco use  history but quit in 1983, denies any alcohol use, walks his dog in the  morning but no other major exercise regimen.   FAMILY HISTORY:  Was reviewed.  The patient states his mother died at  age 37 with all Alzheimer's dementia.  Patient's father died at age 62  with emphysema and cardiac disease.  He has a brother who died at age 73  with leukemia.  No other obvious first degree relatives with premature  cardiovascular disease.   REVIEW OF SYSTEMS:  As outlined above.  He reports problems with  previous kidney stones.  Hiatal hernia.  Reflux symptoms, erectile  dysfunction, anxiety, and basically decreased energy.  Otherwise systems  are negative.   EXAMINATION:  Blood pressure is 121/62, heart rate is 90.  Weight is 314  pounds. He is an obese male in no acute distress.  HEENT:  Conjunctiva, lids normal.  Pharynx clear.  NECK is supple.  No elevated jugular venous pressure, no loud bruits, no  thyromegaly.  LUNGS are clear, without labored breathing at rest  CARDIAC:  Exam was a regular rate and rhythm.  No S3 gallop  or  pericardial rub.  No loud murmur.  ABDOMEN:  Obese, nontender, normoactive bowel sounds.  EXTREMITIES:  No frank pitting edema.  Distal pulses are 2+.  SKIN:  Warm and dry  MUSCULOSKELETAL:  No kyphosis noted.  NEUROPSYCHIATRIC:  Patient alert and oriented x3.  Affect is  appropriate.   IMPRESSION AND RECOMMENDATIONS:  Mr. Goeller is an obese 60 year old male  with a history of hypertension and hyperlipidemia.  He had normal  coronary arteries documented at catheterization in 1999 and a  nonischemic Myoview in 2006 but no other subsequent ischemic testing.  He had recent episodes of chest tightness and  lightheadedness of  uncertain etiology.  These are not clearly exertional symptoms although  he does have some dyspnea on exertion that been gradually progressive  over the years.  Electrocardiogram is nonspecific.  Today we talked  about proceeding with a follow-up exercise Myoview for further  assessment.  If this is  overall low risk, would focus attention on perhaps a gastrointestinal  etiology (question esophageal spasm or recurrent esophagitis), stress  reduction, and weight loss.  If his testing is abnormal we will see him  back to discuss the next step.    Sincerely,      Jonelle Sidle, MD  Electronically Signed    SGM/MedQ  DD: 05/21/2007  DT: 05/23/2007  Job #: [id number]   CC:    Stacie Glaze, MD

## 2010-07-31 ENCOUNTER — Ambulatory Visit
Admission: RE | Admit: 2010-07-31 | Discharge: 2010-07-31 | Disposition: A | Discharge status: Home / Self Care | Payer: BC Managed Care – PPO | Source: Ambulatory Visit | Attending: Internal Medicine | Admitting: Internal Medicine

## 2010-07-31 DIAGNOSIS — R109 Unspecified abdominal pain: Secondary | ICD-10-CM

## 2010-07-31 DIAGNOSIS — R1011 Right upper quadrant pain: Secondary | ICD-10-CM

## 2010-09-09 ENCOUNTER — Ambulatory Visit (INDEPENDENT_AMBULATORY_CARE_PROVIDER_SITE_OTHER): Payer: BC Managed Care – PPO | Admitting: Internal Medicine

## 2010-09-09 ENCOUNTER — Ambulatory Visit (INDEPENDENT_AMBULATORY_CARE_PROVIDER_SITE_OTHER)
Admission: RE | Admit: 2010-09-09 | Discharge: 2010-09-09 | Disposition: A | Discharge status: Home / Self Care | Payer: BC Managed Care – PPO | Source: Ambulatory Visit | Attending: Internal Medicine | Admitting: Internal Medicine

## 2010-09-09 ENCOUNTER — Encounter: Payer: Self-pay | Admitting: Internal Medicine

## 2010-09-09 ENCOUNTER — Other Ambulatory Visit: Payer: Self-pay | Admitting: Internal Medicine

## 2010-09-09 DIAGNOSIS — Z23 Encounter for immunization: Secondary | ICD-10-CM

## 2010-09-09 DIAGNOSIS — IMO0002 Reserved for concepts with insufficient information to code with codable children: Secondary | ICD-10-CM

## 2010-09-09 DIAGNOSIS — S6990XA Unspecified injury of unspecified wrist, hand and finger(s), initial encounter: Secondary | ICD-10-CM

## 2010-09-09 DIAGNOSIS — L03019 Cellulitis of unspecified finger: Secondary | ICD-10-CM

## 2010-09-09 DIAGNOSIS — Z0000 Encounter for general adult medical examination without abnormal findings: Secondary | ICD-10-CM

## 2010-09-09 MED ORDER — CEFTRIAXONE SODIUM 1 G IJ SOLR
1.0000 g | INTRAMUSCULAR | Status: AC
  Administered 2010-09-09: 1 g via INTRAMUSCULAR

## 2010-09-09 MED ORDER — AMOXICILLIN-POT CLAVULANATE ER 1000-62.5 MG PO TB12: 2.0000 | ORAL_TABLET | Freq: Two times a day (BID) | ORAL | Status: AC

## 2010-09-09 NOTE — Telephone Encounter (Signed)
Pt stuck wire in hand.  Needs Tetanus, and hand xray.  Orders sent and pt will be here at 4 pm.

## 2010-09-09 NOTE — Progress Notes (Signed)
Addended by: Willy Eddy on: 09/09/2010 05:42 PM   Modules accepted: Orders

## 2010-09-09 NOTE — Progress Notes (Signed)
  Subjective:    Patient ID: Alejandro Koch, male    DOB: 11-18-1950, 60 y.o.   MRN: [id number]  HPI patient presents with a 24-30 hour old wound to his left index finger.  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  He requires tetanus and came in for the x-ray examination to make sure that there was no wire left in the hand there was no foreign object seen.  The x-ray did show induration and soft tissue swelling he was brought in to the office for examination and indeed there swelling to the finger from the tip to the base of the finger capillary refill is to present to that there is significant induration.      Review of Systems  Constitutional: Negative for fever and fatigue.  HENT: Negative for hearing loss, congestion, neck pain and postnasal drip.   Eyes: Negative for discharge, redness and visual disturbance.  Respiratory: Negative for cough, shortness of breath and wheezing.   Cardiovascular: Negative for leg swelling.  Gastrointestinal: Negative for abdominal pain, constipation and abdominal distention.  Genitourinary: Negative for urgency and frequency.  Musculoskeletal: Negative for joint swelling and arthralgias.  Skin: Positive for wound. Negative for color change and rash.  Neurological: Negative for weakness and light-headedness.  Hematological: Negative for adenopathy.  Psychiatric/Behavioral: Negative for behavioral problems.       Past Medical History  Diagnosis Date  . Allergy   . History of prostate cancer   . Depression   . GERD (gastroesophageal reflux disease)   . Hyperlipidemia   . Hypertension    Past Surgical History  Procedure Date  . Colonoscopy 2008    reports that he quit smoking about 27 years ago. He does not have any smokeless tobacco history on file. He reports that he does not drink alcohol or use illicit drugs. family history includes Alzheimer's disease in his mother; COPD in his  father; Heart disease in his father; and Leukemia in an unspecified family member. Allergies  Allergen Reactions  . Iohexol      Code: HIVES, Desc: PT IS ALLERGIC TO IVP DYE AS NOTED IN PREVIOUS RECORDS IN PACS 02/2009/RM, Onset Date: [id number]     Objective:   Physical Exam  Constitutional: He appears well-developed and well-nourished.  HENT:  Head: Normocephalic and atraumatic.  Eyes: Conjunctivae are normal. Pupils are equal, round, and reactive to light.  Neck: Normal range of motion. Neck supple.  Cardiovascular: Normal rate and regular rhythm.   Pulmonary/Chest: Effort normal and breath sounds normal.  Abdominal: Soft. Bowel sounds are normal.  Skin:       Left index finger with soft tissue swelling from the entrance wound of the fingertip to the proximal metatarsal erythematous tender to touch          Assessment & Plan:  Patient has an acute puncture wound to his finger with cellulitis he'll be given a tetanus immunization he placed on the high-dose Augmentin X. are 1002 by mouth twice a day for 10 days he will also be given a shot of Rocephin IM due to the nature of his infection.  Patient is given careful wound instructions should the erythema and soft tissue swelling not improved medically in the next 24 hours or should he have any discoloration of the fingertip in the next 24 hours he would present to the emergency room or call as refer to hand specialist.

## 2010-09-11 ENCOUNTER — Emergency Department (HOSPITAL_COMMUNITY)
Admission: EM | Admit: 2010-09-11 | Discharge: 2010-09-11 | Disposition: A | Discharge status: Home / Self Care | Payer: BC Managed Care – PPO | Attending: Emergency Medicine | Admitting: Emergency Medicine

## 2010-09-11 ENCOUNTER — Telehealth: Payer: Self-pay | Admitting: *Deleted

## 2010-09-11 DIAGNOSIS — R229 Localized swelling, mass and lump, unspecified: Secondary | ICD-10-CM | POA: Insufficient documentation

## 2010-09-11 DIAGNOSIS — M79609 Pain in unspecified limb: Secondary | ICD-10-CM | POA: Insufficient documentation

## 2010-09-11 DIAGNOSIS — I10 Essential (primary) hypertension: Secondary | ICD-10-CM | POA: Insufficient documentation

## 2010-09-11 DIAGNOSIS — IMO0002 Reserved for concepts with insufficient information to code with codable children: Secondary | ICD-10-CM | POA: Insufficient documentation

## 2010-09-11 DIAGNOSIS — E7800 Pure hypercholesterolemia, unspecified: Secondary | ICD-10-CM | POA: Insufficient documentation

## 2010-09-11 DIAGNOSIS — K219 Gastro-esophageal reflux disease without esophagitis: Secondary | ICD-10-CM | POA: Insufficient documentation

## 2010-09-11 LAB — DIFFERENTIAL
Eosinophils Absolute: 0 10*3/uL (ref 0.0–0.7)
Lymphocytes Relative: 31 % (ref 12–46)
Lymphs Abs: 1.9 10*3/uL (ref 0.7–4.0)
Monocytes Relative: 7 % (ref 3–12)
Neutro Abs: 3.7 10*3/uL (ref 1.7–7.7)
Neutrophils Relative %: 62 % (ref 43–77)

## 2010-09-11 LAB — CBC
HCT: 35.4 % — ABNORMAL LOW (ref 39.0–52.0)
Hemoglobin: 12.2 g/dL — ABNORMAL LOW (ref 13.0–17.0)
MCH: 29.3 pg (ref 26.0–34.0)
MCHC: 34.5 g/dL (ref 30.0–36.0)

## 2010-09-11 LAB — POCT I-STAT, CHEM 8
BUN: 13 mg/dL (ref 6–23)
Chloride: 105 mEq/L (ref 96–112)
HCT: 35 % — ABNORMAL LOW (ref 39.0–52.0)
Potassium: 3.8 mEq/L (ref 3.5–5.1)
Sodium: 137 mEq/L (ref 135–145)

## 2010-09-11 NOTE — Telephone Encounter (Signed)
Per dr Lovell Sheehan send to  Dr sypher or ed- dr sypher states will probably need i and d-will need to go to er- pt informed and states she will go to [facility]

## 2010-09-11 NOTE — Telephone Encounter (Signed)
Pt seen few days ago, states finger is worse now than before (swollen and turning colors).  Pt requesting to be worked in today.     Bonnye will consult with Dr. Lovell Sheehan.

## 2010-09-13 ENCOUNTER — Ambulatory Visit: Payer: BC Managed Care – PPO | Admitting: Internal Medicine

## 2010-09-13 ENCOUNTER — Ambulatory Visit (HOSPITAL_COMMUNITY)
Admission: AD | Admit: 2010-09-13 | Discharge: 2010-09-13 | Disposition: A | Discharge status: Home / Self Care | Payer: BC Managed Care – PPO | Source: Ambulatory Visit | Attending: Orthopedic Surgery | Admitting: Orthopedic Surgery

## 2010-09-13 DIAGNOSIS — Z01812 Encounter for preprocedural laboratory examination: Secondary | ICD-10-CM | POA: Insufficient documentation

## 2010-09-13 DIAGNOSIS — Z01810 Encounter for preprocedural cardiovascular examination: Secondary | ICD-10-CM | POA: Insufficient documentation

## 2010-09-13 DIAGNOSIS — L02519 Cutaneous abscess of unspecified hand: Secondary | ICD-10-CM | POA: Insufficient documentation

## 2010-09-13 DIAGNOSIS — W269XXA Contact with unspecified sharp object(s), initial encounter: Secondary | ICD-10-CM | POA: Insufficient documentation

## 2010-09-13 DIAGNOSIS — Y998 Other external cause status: Secondary | ICD-10-CM | POA: Insufficient documentation

## 2010-09-13 DIAGNOSIS — L03019 Cellulitis of unspecified finger: Secondary | ICD-10-CM | POA: Insufficient documentation

## 2010-09-13 DIAGNOSIS — S61209A Unspecified open wound of unspecified finger without damage to nail, initial encounter: Secondary | ICD-10-CM | POA: Insufficient documentation

## 2010-09-13 LAB — COMPREHENSIVE METABOLIC PANEL
AST: 25 U/L (ref 0–37)
Albumin: 3.8 g/dL (ref 3.5–5.2)
Calcium: 9.1 mg/dL (ref 8.4–10.5)
Creatinine, Ser: 0.77 mg/dL (ref 0.50–1.35)
Total Protein: 6.9 g/dL (ref 6.0–8.3)

## 2010-09-13 LAB — CBC
MCH: 30 pg (ref 26.0–34.0)
MCV: 86.5 fL (ref 78.0–100.0)
Platelets: 245 10*3/uL (ref 150–400)
RDW: 13.1 % (ref 11.5–15.5)

## 2010-09-13 LAB — SURGICAL PCR SCREEN: Staphylococcus aureus: POSITIVE — AB

## 2010-09-14 NOTE — Op Note (Signed)
NAME:  Alejandro Koch, Alejandro Koch NO.:  [id number]  MEDICAL RECORD NO.:  [id number]  LOCATION:  [facility]                         FACILITY:  [facility]  PHYSICIAN:  Madelynn Done, MD  DATE OF BIRTH:  [birth year]  DATE OF PROCEDURE:  09/13/2010 DATE OF DISCHARGE:                              OPERATIVE REPORT   PREOPERATIVE DIAGNOSIS:  Left index finger infection, deep space infection.  POSTOPERATIVE DIAGNOSIS:  Left index finger infection, deep space infection.  ATTENDING PHYSICIAN:  Madelynn Done, MD, who scrubbed and present for the entire procedure.  ASSISTANT SURGEON:  None.  ANESTHESIA:  Xylocaine 1% with 0.25%  Marcaine local block with IV sedation.  SURGICAL PROCEDURE:  Left index finger incision and drainage of a complicated abscess.  SURGICAL INDICATIONS:  [name] is a 60 year old right-hand-dominant gentleman who presented with a worsening pain and swelling of his left index finger.  The patient had a complicated abscess to the left index finger.  It was recommend he undergo the above procedure.  Risks, benefits, and alternatives were discussed in detail with the patient and a signed informed consent was obtained.  Risks include, but not limited to, bleeding, infection, damage to nearby nerves, arteries, or tendons, loss of motion of the wrist and digits, persistent infection, need for further surgical intervention.  DESCRIPTION OF THE PROCEDURE:  The patient was appropriately identified in the preop holding area and a mark with permanent marker was made on the left index finger to indicate correct operative site.  The patient was then brought back to operating room, placed supine on the anesthesia room table where the IV sedation was administered.  Well-padded tourniquet was then placed on the left brachium and sealed with 1000 drape.  Local anesthetic was administered, the patient tolerated this well.  Left upper extremity was then prepped and draped  in normal sterile fashion.  Time-out was called.  Correct side was identified and procedure was then begun.  Attention was then turned to left index finger where the previous radial based incision was then lengthened both proximally and distally and then a gross purulence was then encountered deep within the pulp of the finger.  Following this, blunt dissection was then carried out to divide the septi in the distal pulp of the finger.  This was then elevated.  The mid axial approach was then used in order to care the flexor sheath involvement.  The wound was then thoroughly irrigated.  Aggressive debridement was then carried out of the necrotic fat necrosis.  Copious irrigation was done throughout.  The small Iodoform packing gauze was then applied and packed into the defects.  Adaptic dressing and sterile compressive bandage were then applied.  The patient tolerated the procedure well and returned to recovery room in good condition.  POSTPROCEDURE PLAN:  The patient will be discharged home and seen back in the office in approximately 3 days for a wound check, packing removal, whirlpool, and wound care with her therapist.  Therapy point will be set up.     Madelynn Done, MD     FWO/MEDQ  D:  09/13/2010  T:  09/14/2010  Job:  [id number]  Electronically Signed by Bradly Bienenstock IV MD on 09/14/2010 08:47:49 PM

## 2010-09-18 LAB — ANAEROBIC CULTURE: Gram Stain: NONE SEEN

## 2010-09-18 LAB — CULTURE, ROUTINE-ABSCESS: Gram Stain: NONE SEEN

## 2010-09-27 ENCOUNTER — Other Ambulatory Visit: Payer: Self-pay | Admitting: Internal Medicine

## 2010-10-07 ENCOUNTER — Other Ambulatory Visit: Payer: Self-pay | Admitting: *Deleted

## 2010-10-07 MED ORDER — TESTOSTERONE 50 MG/5GM (1%) TD GEL: 5.0000 g | Freq: Every day | TRANSDERMAL | Status: DC

## 2010-10-07 MED ORDER — TEMAZEPAM 30 MG PO CAPS: 30.0000 mg | ORAL_CAPSULE | Freq: Every evening | ORAL | Status: DC | PRN

## 2010-10-24 ENCOUNTER — Ambulatory Visit: Payer: BC Managed Care – PPO | Admitting: Internal Medicine

## 2010-10-28 ENCOUNTER — Other Ambulatory Visit: Payer: Self-pay | Admitting: Internal Medicine

## 2010-10-31 ENCOUNTER — Telehealth: Payer: Self-pay | Admitting: Internal Medicine

## 2010-10-31 MED ORDER — ZOLPIDEM TARTRATE 10 MG PO TABS: 10.0000 mg | ORAL_TABLET | Freq: Every evening | ORAL | Status: DC | PRN

## 2010-10-31 NOTE — Telephone Encounter (Signed)
SENT TO BE FAXED

## 2010-10-31 NOTE — Telephone Encounter (Signed)
Pt requesting refill on zolpidem (AMBIEN) 10 MG tablet

## 2010-12-09 ENCOUNTER — Ambulatory Visit (INDEPENDENT_AMBULATORY_CARE_PROVIDER_SITE_OTHER): Payer: BC Managed Care – PPO | Admitting: Internal Medicine

## 2010-12-09 ENCOUNTER — Encounter: Payer: Self-pay | Admitting: Internal Medicine

## 2010-12-09 VITALS — BP 130/80 | HR 76 | Temp 98.2°F | Resp 16 | Ht 75.0 in | Wt 294.0 lb

## 2010-12-09 DIAGNOSIS — E119 Type 2 diabetes mellitus without complications: Secondary | ICD-10-CM

## 2010-12-09 DIAGNOSIS — E668 Other obesity: Secondary | ICD-10-CM

## 2010-12-09 DIAGNOSIS — E349 Endocrine disorder, unspecified: Secondary | ICD-10-CM

## 2010-12-09 DIAGNOSIS — Z23 Encounter for immunization: Secondary | ICD-10-CM

## 2010-12-09 NOTE — Progress Notes (Signed)
  Subjective:    Patient ID: Alejandro Koch, male    DOB: 14-Oct-1950, 60 y.o.   MRN: [id number]  HPI Lost weight Blood pressure stable on 1/2 benicar 20/12.5...Marland KitchenMarland Kitchen.  10/6.25  Dose working Diabetes  ... Never took the kombiglyze Right sided flank pain better   hx of fatty liver We reviewed his diet and exercise goals for the next visit    Review of Systems  Constitutional: Negative for fever and fatigue.  HENT: Negative for hearing loss, congestion, neck pain and postnasal drip.   Eyes: Negative for discharge, redness and visual disturbance.  Respiratory: Negative for cough, shortness of breath and wheezing.   Cardiovascular: Negative for leg swelling.  Gastrointestinal: Negative for abdominal pain, constipation and abdominal distention.  Genitourinary: Negative for urgency and frequency.  Musculoskeletal: Negative for joint swelling and arthralgias.  Skin: Negative for color change and rash.  Neurological: Negative for weakness and light-headedness.  Hematological: Negative for adenopathy.  Psychiatric/Behavioral: Negative for behavioral problems.   Past Medical History  Diagnosis Date  . Allergy   . History of prostate cancer   . Depression   . GERD (gastroesophageal reflux disease)   . Hyperlipidemia   . Hypertension    Past Surgical History  Procedure Date  . Colonoscopy 2008    reports that he quit smoking about 27 years ago. He does not have any smokeless tobacco history on file. He reports that he does not drink alcohol or use illicit drugs. family history includes Alzheimer's disease in his mother; COPD in his father; Heart disease in his father; and Leukemia in an unspecified family member. Allergies  Allergen Reactions  . Iohexol      Code: HIVES, Desc: PT IS ALLERGIC TO IVP DYE AS NOTED IN PREVIOUS RECORDS IN PACS 02/2009/RM, Onset Date: [id number]         Objective:   Physical Exam  Constitutional: He appears well-developed and well-nourished.  HENT:    Head: Normocephalic and atraumatic.  Eyes: Conjunctivae are normal. Pupils are equal, round, and reactive to light.  Neck: Normal range of motion. Neck supple.  Cardiovascular: Normal rate and regular rhythm.   Pulmonary/Chest: Effort normal and breath sounds normal.  Abdominal: Soft. Bowel sounds are normal.          Assessment & Plan:  Will obtain hemoglobin A1c today I believe his diabetes will be better controlled with the weight loss we are happy with his current protocol of exercise cutting out sugars from his diet and it resulted in significant weight loss he'll followup in 4 months time our goal is an additional 10 pounds in that period of time.  And a hemoglobin A1c to be amenable to monitor today cholesterol monitored at next visit

## 2011-02-20 ENCOUNTER — Ambulatory Visit (INDEPENDENT_AMBULATORY_CARE_PROVIDER_SITE_OTHER): Payer: BC Managed Care – PPO

## 2011-02-20 DIAGNOSIS — D649 Anemia, unspecified: Secondary | ICD-10-CM

## 2011-02-20 DIAGNOSIS — J209 Acute bronchitis, unspecified: Secondary | ICD-10-CM

## 2011-02-20 DIAGNOSIS — IMO0001 Reserved for inherently not codable concepts without codable children: Secondary | ICD-10-CM

## 2011-02-20 DIAGNOSIS — R509 Fever, unspecified: Secondary | ICD-10-CM

## 2011-03-10 ENCOUNTER — Other Ambulatory Visit: Payer: Self-pay | Admitting: *Deleted

## 2011-03-10 MED ORDER — TEMAZEPAM 30 MG PO CAPS: 30.0000 mg | ORAL_CAPSULE | Freq: Every evening | ORAL | Status: DC | PRN

## 2011-03-18 HISTORY — PX: FINGER SURGERY: SHX640

## 2011-04-09 ENCOUNTER — Other Ambulatory Visit: Payer: Self-pay | Admitting: *Deleted

## 2011-04-09 MED ORDER — TESTOSTERONE 50 MG/5GM (1%) TD GEL: 5.0000 g | Freq: Every day | TRANSDERMAL | Status: DC

## 2011-05-06 ENCOUNTER — Other Ambulatory Visit: Payer: Self-pay | Admitting: Internal Medicine

## 2011-05-13 ENCOUNTER — Other Ambulatory Visit: Payer: Self-pay | Admitting: *Deleted

## 2011-05-13 MED ORDER — ALPRAZOLAM 0.5 MG PO TABS: 0.5000 mg | ORAL_TABLET | Freq: Three times a day (TID) | ORAL | Status: DC | PRN

## 2011-05-13 MED ORDER — TEMAZEPAM 30 MG PO CAPS: 30.0000 mg | ORAL_CAPSULE | Freq: Every evening | ORAL | Status: DC | PRN

## 2011-05-13 MED ORDER — ZOLPIDEM TARTRATE 10 MG PO TABS: 10.0000 mg | ORAL_TABLET | Freq: Every evening | ORAL | Status: DC | PRN

## 2011-06-03 ENCOUNTER — Other Ambulatory Visit: Payer: Self-pay | Admitting: Internal Medicine

## 2011-06-17 ENCOUNTER — Other Ambulatory Visit: Payer: Self-pay | Admitting: *Deleted

## 2011-06-17 MED ORDER — ZOLPIDEM TARTRATE 10 MG PO TABS: 10.0000 mg | ORAL_TABLET | Freq: Every evening | ORAL | Status: DC | PRN

## 2011-08-12 ENCOUNTER — Other Ambulatory Visit: Payer: Self-pay | Admitting: *Deleted

## 2011-08-12 MED ORDER — TEMAZEPAM 30 MG PO CAPS: 30.0000 mg | ORAL_CAPSULE | Freq: Every evening | ORAL | Status: DC | PRN

## 2011-08-13 ENCOUNTER — Other Ambulatory Visit: Payer: Self-pay | Admitting: Internal Medicine

## 2011-08-25 ENCOUNTER — Other Ambulatory Visit (INDEPENDENT_AMBULATORY_CARE_PROVIDER_SITE_OTHER): Payer: BC Managed Care – PPO

## 2011-08-25 DIAGNOSIS — Z0000 Encounter for general adult medical examination without abnormal findings: Secondary | ICD-10-CM

## 2011-08-25 LAB — POCT URINALYSIS DIPSTICK
Leukocytes, UA: NEGATIVE
Protein, UA: NEGATIVE
Urobilinogen, UA: 0.2
pH, UA: 5

## 2011-08-25 LAB — CBC WITH DIFFERENTIAL/PLATELET
Eosinophils Relative: 1.1 % (ref 0.0–5.0)
HCT: 38.7 % — ABNORMAL LOW (ref 39.0–52.0)
Lymphocytes Relative: 38.5 % (ref 12.0–46.0)
Lymphs Abs: 1.7 10*3/uL (ref 0.7–4.0)
Monocytes Relative: 6.5 % (ref 3.0–12.0)
Platelets: 181 10*3/uL (ref 150.0–400.0)
WBC: 4.4 10*3/uL — ABNORMAL LOW (ref 4.5–10.5)

## 2011-08-25 LAB — PSA: PSA: 1.31 ng/mL (ref 0.10–4.00)

## 2011-08-25 LAB — LIPID PANEL
LDL Cholesterol: 66 mg/dL (ref 0–99)
Total CHOL/HDL Ratio: 3
VLDL: 21.8 mg/dL (ref 0.0–40.0)

## 2011-08-25 LAB — HEPATIC FUNCTION PANEL
ALT: 24 U/L (ref 0–53)
AST: 19 U/L (ref 0–37)
Alkaline Phosphatase: 75 U/L (ref 39–117)
Bilirubin, Direct: 0.1 mg/dL (ref 0.0–0.3)
Total Bilirubin: 0.3 mg/dL (ref 0.3–1.2)

## 2011-08-25 LAB — BASIC METABOLIC PANEL
BUN: 10 mg/dL (ref 6–23)
GFR: 102.86 mL/min (ref >60.00)
Potassium: 4 mEq/L (ref 3.5–5.1)
Sodium: 140 mEq/L (ref 135–145)

## 2011-09-01 ENCOUNTER — Ambulatory Visit (INDEPENDENT_AMBULATORY_CARE_PROVIDER_SITE_OTHER): Payer: BC Managed Care – PPO | Admitting: Internal Medicine

## 2011-09-01 VITALS — BP 116/80 | HR 68 | Temp 98.3°F | Resp 16 | Ht 77.0 in | Wt 293.0 lb

## 2011-09-01 DIAGNOSIS — E1165 Type 2 diabetes mellitus with hyperglycemia: Secondary | ICD-10-CM

## 2011-09-01 DIAGNOSIS — E1169 Type 2 diabetes mellitus with other specified complication: Secondary | ICD-10-CM

## 2011-09-01 DIAGNOSIS — Z0000 Encounter for general adult medical examination without abnormal findings: Secondary | ICD-10-CM

## 2011-09-01 DIAGNOSIS — I10 Essential (primary) hypertension: Secondary | ICD-10-CM

## 2011-09-01 MED ORDER — SAXAGLIPTIN-METFORMIN ER 5-1000 MG PO TB24: 1.0000 | ORAL_TABLET | Freq: Every day | ORAL | Status: DC

## 2011-09-01 NOTE — Progress Notes (Signed)
Subjective:    Patient ID: Alejandro Koch, male    DOB: May 28, 1950, 61 y.o.   MRN: [id number]  HPI CPX Weight gain monitoring of lipids Poor control of DM Just started taking the onglyza   Review of Systems  Constitutional: Negative for fever and fatigue.  HENT: Negative for hearing loss, congestion, neck pain and postnasal drip.   Eyes: Negative for discharge, redness and visual disturbance.  Respiratory: Negative for cough, shortness of breath and wheezing.   Cardiovascular: Negative for leg swelling.  Gastrointestinal: Negative for abdominal pain, constipation and abdominal distention.  Genitourinary: Negative for urgency and frequency.  Musculoskeletal: Negative for joint swelling and arthralgias.  Skin: Negative for color change and rash.  Neurological: Negative for weakness and light-headedness.  Hematological: Negative for adenopathy.  Psychiatric/Behavioral: Negative for behavioral problems.   Past Medical History  Diagnosis Date  . Allergy   . History of prostate cancer   . Depression   . GERD (gastroesophageal reflux disease)   . Hyperlipidemia   . Hypertension     History   Social History  . Marital Status: Married    Spouse Name: N/A    Number of Children: N/A  . Years of Education: N/A   Occupational History  . Not on file.   Social History Main Topics  . Smoking status: Former Smoker    Quit date: 03/18/1983  . Smokeless tobacco: Not on file  . Alcohol Use: No  . Drug Use: No  . Sexually Active: Yes   Other Topics Concern  . Not on file   Social History Narrative  . No narrative on file    Past Surgical History  Procedure Date  . Colonoscopy 2008    Family History  Problem Relation Age of Onset  . Leukemia    . Alzheimer's disease Mother   . Heart disease Father   . COPD Father     Allergies  Allergen Reactions  . Iohexol      Code: HIVES, Desc: PT IS ALLERGIC TO IVP DYE AS NOTED IN PREVIOUS RECORDS IN PACS 02/2009/RM, Onset  Date: [id number]     Current Outpatient Prescriptions on File Prior to Visit  Medication Sig Dispense Refill  . ALPRAZolam (XANAX) 0.5 MG tablet Take 1 tablet (0.5 mg total) by mouth 3 (three) times daily as needed.  30 tablet  3  . CRESTOR 10 MG tablet TAKE 1 TABLET BY MOUTH DAILY  30 tablet  5  . ketorolac (ACULAR) 0.5 % ophthalmic solution USE AS DIRECTED  5 mL  1  . NEXIUM 40 MG capsule TAKE 1 CAPSULE TWICE DAILY  60 capsule  8  . olmesartan-hydrochlorothiazide (BENICAR HCT) 20-12.5 MG per tablet Take 1 tablet by mouth daily.        . ONGLYZA 5 MG TABS tablet ONE BY MOUTH DAILY  30 tablet  0  . Saxagliptin-Metformin (KOMBIGLYZE XR) 07-998 MG TB24 Take by mouth daily.        . Tamsulosin HCl (FLOMAX) 0.4 MG CAPS Take 0.4 mg by mouth daily after supper.        . temazepam (RESTORIL) 30 MG capsule Take 1 capsule (30 mg total) by mouth at bedtime as needed.  30 capsule  5  . testosterone (TESTIM) 50 MG/5GM GEL Place 5 g onto the skin daily. 1 pack qod and 1.5 pack qod  300 g  5  . zolpidem (AMBIEN) 10 MG tablet Take 1 tablet (10 mg total) by mouth at bedtime as needed.  20 tablet  3  . DISCONTD: temazepam (RESTORIL) 30 MG capsule Take 1 capsule (30 mg total) by mouth at bedtime as needed for sleep.  30 capsule  3    BP 116/80  Pulse 68  Temp 98.3 F (36.8 C)  Resp 16  Ht 6\' 5"  (1.956 m)  Wt 293 lb (132.904 kg)  BMI 34.74 kg/m2       Objective:   Physical Exam  Nursing note and vitals reviewed. Constitutional: He appears well-developed and well-nourished.  HENT:  Head: Normocephalic and atraumatic.  Eyes: Conjunctivae are normal. Pupils are equal, round, and reactive to light.  Neck: Normal range of motion. Neck supple.  Cardiovascular: Normal rate and regular rhythm.   Pulmonary/Chest: Effort normal and breath sounds normal.  Abdominal: Soft. Bowel sounds are normal.          Assessment & Plan:   Patient presents for yearly preventative medicine examination.   all  immunizations and health maintenance protocols were reviewed with the patient and they are up to date with these protocols.   screening laboratory values were reviewed with the patient including screening of hyperlipidemia PSA renal function and hepatic function.   There medications past medical history social history problem list and allergies were reviewed in detail.   Goals were established with regard to weight loss exercise diet in compliance with medications  Additional intervention in this patient involves treatment for his hyperlipidemia and hypertension as well as morbid obesity.  We discussed his diabetes how it affects both his cholesterol and his risk for renal disease and cardiovascular disease the primary intervention would be weight loss we discussed a gluten-free diet variation that may allow him to better control his blood glucose as well as pursue weight loss

## 2011-09-01 NOTE — Patient Instructions (Signed)
Practical paleo  Look up this diet on line

## 2011-09-18 ENCOUNTER — Ambulatory Visit (INDEPENDENT_AMBULATORY_CARE_PROVIDER_SITE_OTHER): Payer: BC Managed Care – PPO | Admitting: Emergency Medicine

## 2011-09-18 VITALS — BP 136/76 | HR 68 | Temp 97.7°F | Resp 16 | Ht 76.0 in | Wt 293.6 lb

## 2011-09-18 DIAGNOSIS — L03119 Cellulitis of unspecified part of limb: Secondary | ICD-10-CM

## 2011-09-18 MED ORDER — SULFAMETHOXAZOLE-TRIMETHOPRIM 800-160 MG PO TABS: 1.0000 | ORAL_TABLET | Freq: Two times a day (BID) | ORAL | Status: AC

## 2011-09-18 NOTE — Progress Notes (Signed)
Patient Name: Alejandro Koch Date of Birth: 07-03-1950 Medical Record Number: [id number] Gender: male Date of Encounter: 09/18/2011  Chief Complaint: LEG ABRASIONS   History of Present Illness:  Alejandro Koch is a 61 y.o. very pleasant male patient who presents with the following:  Slipped and fell on escalator and has numerous parallel lacerations both shins.  Right leg is infected.  Current on Td  Patient Active Problem List  Diagnosis  . PLANTAR WART  . DIABETES MELLITUS, CONTROLLED  . HYPOGONADISM  . HYPERLIPIDEMIA  . DEPRESSION  . HYPERTENSION  . ALLERGIC RHINITIS  . GERD  . HAMSTRING TENDINITIS  . INSOMNIA  . CHEST PAIN, INTERMITTENT  . Abdominal Pain, Right Upper Quadrant  . PROSTATE CANCER, HX OF  . FATTY LIVER DISEASE  . FLANK PAIN, RIGHT   Past Medical History  Diagnosis Date  . Allergy   . History of prostate cancer   . Depression   . GERD (gastroesophageal reflux disease)   . Hyperlipidemia   . Hypertension    Past Surgical History  Procedure Date  . Colonoscopy 2008   History  Substance Use Topics  . Smoking status: Former Smoker    Quit date: 03/18/1983  . Smokeless tobacco: Not on file  . Alcohol Use: No   Family History  Problem Relation Age of Onset  . Leukemia    . Alzheimer's disease Mother   . Heart disease Father   . COPD Father    Allergies  Allergen Reactions  . Iohexol      Code: HIVES, Desc: PT IS ALLERGIC TO IVP DYE AS NOTED IN PREVIOUS RECORDS IN PACS 02/2009/RM, Onset Date: [id number]     Medication list has been reviewed and updated.  Current Outpatient Prescriptions on File Prior to Visit  Medication Sig Dispense Refill  . ALPRAZolam (XANAX) 0.5 MG tablet Take 1 tablet (0.5 mg total) by mouth 3 (three) times daily as needed.  30 tablet  3  . CRESTOR 10 MG tablet TAKE 1 TABLET BY MOUTH DAILY  30 tablet  5  . ketorolac (ACULAR) 0.5 % ophthalmic solution USE AS DIRECTED  5 mL  1  . NEXIUM 40 MG capsule TAKE 1 CAPSULE  TWICE DAILY  60 capsule  8  . Saxagliptin-Metformin (KOMBIGLYZE XR) 07-998 MG TB24 Take by mouth daily.        . Saxagliptin-Metformin (KOMBIGLYZE XR) 07-998 MG TB24 Take 1 tablet by mouth daily.  30 tablet  11  . Tamsulosin HCl (FLOMAX) 0.4 MG CAPS Take 0.4 mg by mouth daily after supper.        . temazepam (RESTORIL) 30 MG capsule Take 1 capsule (30 mg total) by mouth at bedtime as needed.  30 capsule  5  . temazepam (RESTORIL) 30 MG capsule Take 30 mg by mouth at bedtime as needed.      . testosterone (TESTIM) 50 MG/5GM GEL Place 5 g onto the skin daily. 1 pack qod and 1.5 pack qod  300 g  5  . zolpidem (AMBIEN) 10 MG tablet Take 1 tablet (10 mg total) by mouth at bedtime as needed.  20 tablet  3    Review of Systems:  As per HPI, otherwise negative.    Physical Examination: Filed Vitals:   09/18/11 1248  BP: 136/76  Pulse: 68  Temp: 97.7 F (36.5 C)  Resp: 16   Filed Vitals:   09/18/11 1248  Height: 6\' 4"  (1.93 m)  Weight: 293 lb 9.6 oz (133.176  kg)   Body mass index is 35.74 kg/(m^2). Ideal Body Weight: Weight in (lb) to have BMI = 25: 205    GEN: WDWN, NAD, Non-toxic, Alert & Oriented x 3 HEENT: Atraumatic, Normocephalic.  Ears and Nose: No external deformity. EXTR: No clubbing/cyanosis/edema NEURO: Normal gait.  PSYCH: Normally interactive. Conversant. Not depressed or anxious appearing.  Calm demeanor.  EXT:  Multiple infected lacerations right leg with cellulitis  EKG / Labs / Xrays: None available at time of encounter  Assessment and Plan: Laceration and cellulitis leg Antibiotic Elevate and local heat Follow up as needed   Carmelina Dane, MD

## 2011-10-09 ENCOUNTER — Other Ambulatory Visit: Payer: Self-pay | Admitting: *Deleted

## 2011-10-09 MED ORDER — TESTOSTERONE 50 MG/5GM (1%) TD GEL: TRANSDERMAL | Status: DC

## 2011-10-15 ENCOUNTER — Other Ambulatory Visit: Payer: Self-pay | Admitting: Internal Medicine

## 2011-10-31 ENCOUNTER — Telehealth: Payer: Self-pay | Admitting: Internal Medicine

## 2011-10-31 MED ORDER — OLMESARTAN MEDOXOMIL-HCTZ 20-12.5 MG PO TABS: 1.0000 | ORAL_TABLET | Freq: Every day | ORAL | Status: DC

## 2011-10-31 NOTE — Telephone Encounter (Signed)
Patient called requesting a refill for Benecar.  Pharmacy is [facility].

## 2011-10-31 NOTE — Telephone Encounter (Signed)
Done

## 2011-11-11 ENCOUNTER — Other Ambulatory Visit: Payer: Self-pay | Admitting: Internal Medicine

## 2011-11-18 ENCOUNTER — Other Ambulatory Visit: Payer: Self-pay | Admitting: *Deleted

## 2011-11-18 MED ORDER — ALPRAZOLAM 0.5 MG PO TABS: 0.5000 mg | ORAL_TABLET | Freq: Three times a day (TID) | ORAL | Status: DC | PRN

## 2011-11-24 ENCOUNTER — Other Ambulatory Visit (INDEPENDENT_AMBULATORY_CARE_PROVIDER_SITE_OTHER): Payer: BC Managed Care – PPO

## 2011-11-24 DIAGNOSIS — E1165 Type 2 diabetes mellitus with hyperglycemia: Secondary | ICD-10-CM

## 2011-11-27 ENCOUNTER — Other Ambulatory Visit: Payer: BC Managed Care – PPO

## 2011-12-03 ENCOUNTER — Ambulatory Visit (INDEPENDENT_AMBULATORY_CARE_PROVIDER_SITE_OTHER): Payer: BC Managed Care – PPO | Admitting: Internal Medicine

## 2011-12-03 ENCOUNTER — Encounter: Payer: Self-pay | Admitting: Internal Medicine

## 2011-12-03 VITALS — BP 154/82 | HR 72 | Temp 98.2°F | Resp 16 | Ht 77.0 in | Wt 286.0 lb

## 2011-12-03 DIAGNOSIS — R1011 Right upper quadrant pain: Secondary | ICD-10-CM

## 2011-12-03 DIAGNOSIS — IMO0002 Reserved for concepts with insufficient information to code with codable children: Secondary | ICD-10-CM

## 2011-12-03 DIAGNOSIS — E1169 Type 2 diabetes mellitus with other specified complication: Secondary | ICD-10-CM

## 2011-12-03 DIAGNOSIS — E291 Testicular hypofunction: Secondary | ICD-10-CM

## 2011-12-03 DIAGNOSIS — Z23 Encounter for immunization: Secondary | ICD-10-CM

## 2011-12-03 LAB — HEPATIC FUNCTION PANEL
Alkaline Phosphatase: 79 U/L (ref 39–117)
Bilirubin, Direct: 0.1 mg/dL (ref 0.0–0.3)
Total Bilirubin: 0.7 mg/dL (ref 0.3–1.2)
Total Protein: 7 g/dL (ref 6.0–8.3)

## 2011-12-03 NOTE — Progress Notes (Signed)
Subjective:    Patient ID: Alejandro Koch, male    DOB: December 02, 1950, 61 y.o.   MRN: [id number]  HPI Had significant intolerance to substitute for  onglyza Has improved glucose with diet  changes He was not able to follow the paleo diet  Review of Systems  Constitutional: Negative for fever and fatigue.  HENT: Negative for hearing loss, congestion, neck pain and postnasal drip.   Eyes: Negative for discharge, redness and visual disturbance.  Respiratory: Negative for cough, shortness of breath and wheezing.   Cardiovascular: Negative for leg swelling.  Gastrointestinal: Negative for abdominal pain, constipation and abdominal distention.  Genitourinary: Negative for urgency and frequency.  Musculoskeletal: Negative for joint swelling and arthralgias.  Skin: Negative for color change and rash.  Neurological: Negative for weakness and light-headedness.  Hematological: Negative for adenopathy.  Psychiatric/Behavioral: Negative for behavioral problems.   Past Medical History  Diagnosis Date  . Allergy   . History of prostate cancer   . Depression   . GERD (gastroesophageal reflux disease)   . Hyperlipidemia   . Hypertension     History   Social History  . Marital Status: Married    Spouse Name: N/A    Number of Children: N/A  . Years of Education: N/A   Occupational History  . Not on file.   Social History Main Topics  . Smoking status: Former Smoker    Quit date: 03/18/1983  . Smokeless tobacco: Not on file  . Alcohol Use: No  . Drug Use: No  . Sexually Active: Yes   Other Topics Concern  . Not on file   Social History Narrative  . No narrative on file    Past Surgical History  Procedure Date  . Colonoscopy 2008    Family History  Problem Relation Age of Onset  . Leukemia    . Alzheimer's disease Mother   . Heart disease Father   . COPD Father     Allergies  Allergen Reactions  . Iohexol      Code: HIVES, Desc: PT IS ALLERGIC TO IVP DYE AS NOTED IN  PREVIOUS RECORDS IN PACS 02/2009/RM, Onset Date: [id number]     Current Outpatient Prescriptions on File Prior to Visit  Medication Sig Dispense Refill  . ALPRAZolam (XANAX) 0.5 MG tablet Take 1 tablet (0.5 mg total) by mouth 3 (three) times daily as needed.  30 tablet  3  . CRESTOR 10 MG tablet TAKE 1 TABLET BY MOUTH DAILY  30 tablet  5  . ketorolac (ACULAR) 0.5 % ophthalmic solution USE AS DIRECTED  5 mL  1  . NEXIUM 40 MG capsule TAKE 1 CAPSULE TWICE DAILY  60 capsule  8  . olmesartan-hydrochlorothiazide (BENICAR HCT) 20-12.5 MG per tablet Take 1 tablet by mouth daily.  90 tablet  1  . ONGLYZA 5 MG TABS tablet ONE BY MOUTH DAILY  30 tablet  0  . Saxagliptin-Metformin (KOMBIGLYZE XR) 07-998 MG TB24 Take by mouth daily.        . Saxagliptin-Metformin (KOMBIGLYZE XR) 07-998 MG TB24 Take 1 tablet by mouth daily.  30 tablet  11  . Tamsulosin HCl (FLOMAX) 0.4 MG CAPS Take 0.4 mg by mouth daily after supper.        . temazepam (RESTORIL) 30 MG capsule Take 1 capsule (30 mg total) by mouth at bedtime as needed.  30 capsule  5  . temazepam (RESTORIL) 30 MG capsule Take 30 mg by mouth at bedtime as needed.      Marland Kitchen  testosterone (TESTIM) 50 MG/5GM GEL Apply 2 tubes qd as directed  60 Tube  5  . zolpidem (AMBIEN) 10 MG tablet Take 1 tablet (10 mg total) by mouth at bedtime as needed.  20 tablet  3    BP 154/82  Pulse 72  Temp 98.2 F (36.8 C)  Resp 16  Ht 6\' 5"  (1.956 m)  Wt 286 lb (129.729 kg)  BMI 33.91 kg/m2       Objective:   Physical Exam  Nursing note and vitals reviewed. Constitutional: He appears well-developed and well-nourished.  HENT:  Head: Normocephalic and atraumatic.  Eyes: Conjunctivae normal are normal. Pupils are equal, round, and reactive to light.  Neck: Normal range of motion. Neck supple.  Cardiovascular: Normal rate and regular rhythm.   Pulmonary/Chest: Effort normal and breath sounds normal.  Abdominal: Soft. Bowel sounds are normal.          Assessment &  Plan:  Monitoring for Diabetes  Has lost 8 pounds. Increased pain in the right upper quadrant form fatty liver monitor liver functions Resume onglyza

## 2011-12-03 NOTE — Patient Instructions (Signed)
The patient is instructed to continue all medications as prescribed. Schedule followup with check out clerk upon leaving the clinic  

## 2011-12-08 NOTE — Progress Notes (Signed)
May use the testim

## 2011-12-08 NOTE — Progress Notes (Signed)
Quick Note:  Called and spoke with pt and pt is aware. Pt states he has some testim from when he was previously using it. The directions were 1 act every other day. Pls advise if pt can continue this medication and the directions. ______

## 2011-12-08 NOTE — Progress Notes (Signed)
May use the testim 

## 2011-12-09 ENCOUNTER — Encounter: Payer: Self-pay | Admitting: Internal Medicine

## 2011-12-24 ENCOUNTER — Other Ambulatory Visit: Payer: Self-pay | Admitting: Internal Medicine

## 2012-02-21 ENCOUNTER — Other Ambulatory Visit: Payer: Self-pay | Admitting: Internal Medicine

## 2012-02-25 ENCOUNTER — Other Ambulatory Visit (INDEPENDENT_AMBULATORY_CARE_PROVIDER_SITE_OTHER): Payer: BC Managed Care – PPO

## 2012-02-25 DIAGNOSIS — E1165 Type 2 diabetes mellitus with hyperglycemia: Secondary | ICD-10-CM

## 2012-02-25 DIAGNOSIS — R7989 Other specified abnormal findings of blood chemistry: Secondary | ICD-10-CM

## 2012-02-25 DIAGNOSIS — E291 Testicular hypofunction: Secondary | ICD-10-CM

## 2012-02-25 LAB — TESTOSTERONE: Testosterone: 397 ng/dL (ref 350.00–890.00)

## 2012-03-04 ENCOUNTER — Ambulatory Visit: Payer: BC Managed Care – PPO | Admitting: Internal Medicine

## 2012-03-21 ENCOUNTER — Other Ambulatory Visit: Payer: Self-pay | Admitting: Internal Medicine

## 2012-03-30 ENCOUNTER — Telehealth: Payer: Self-pay | Admitting: Internal Medicine

## 2012-03-30 MED ORDER — HYDROCORTISONE ACE-PRAMOXINE 2.5-1 % RE CREA: TOPICAL_CREAM | Freq: Three times a day (TID) | RECTAL | Status: DC

## 2012-03-30 NOTE — Telephone Encounter (Signed)
Pt's appt is a follow up on a1c. Pt usually comes in 1 wk prior for the labs. Can I schedule this for him?. His 3 mo is now a 5 month.

## 2012-03-30 NOTE — Telephone Encounter (Signed)
LMOM

## 2012-03-30 NOTE — Telephone Encounter (Signed)
Pt needs refill on analpram hc 2.5% crean call into [facility]

## 2012-03-30 NOTE — Telephone Encounter (Signed)
Yes he may have aic and code is 250.00 and please tell pt when you talk with him that the medication he requested has been sent to his pharmacy

## 2012-04-05 NOTE — Telephone Encounter (Signed)
appt set

## 2012-04-12 ENCOUNTER — Other Ambulatory Visit (INDEPENDENT_AMBULATORY_CARE_PROVIDER_SITE_OTHER): Payer: BC Managed Care – PPO

## 2012-04-12 DIAGNOSIS — E291 Testicular hypofunction: Secondary | ICD-10-CM

## 2012-04-12 DIAGNOSIS — E349 Endocrine disorder, unspecified: Secondary | ICD-10-CM

## 2012-04-12 DIAGNOSIS — E119 Type 2 diabetes mellitus without complications: Secondary | ICD-10-CM

## 2012-04-13 ENCOUNTER — Other Ambulatory Visit: Payer: Self-pay | Admitting: Internal Medicine

## 2012-04-14 ENCOUNTER — Encounter: Payer: Self-pay | Admitting: Internal Medicine

## 2012-04-19 ENCOUNTER — Encounter: Payer: Self-pay | Admitting: Internal Medicine

## 2012-04-19 ENCOUNTER — Ambulatory Visit (INDEPENDENT_AMBULATORY_CARE_PROVIDER_SITE_OTHER): Payer: BC Managed Care – PPO | Admitting: Internal Medicine

## 2012-04-19 VITALS — BP 142/88 | Temp 97.9°F | Wt 290.0 lb

## 2012-04-19 DIAGNOSIS — E119 Type 2 diabetes mellitus without complications: Secondary | ICD-10-CM

## 2012-04-19 DIAGNOSIS — I10 Essential (primary) hypertension: Secondary | ICD-10-CM

## 2012-04-19 DIAGNOSIS — K7689 Other specified diseases of liver: Secondary | ICD-10-CM

## 2012-04-19 MED ORDER — EXENATIDE ER 2 MG ~~LOC~~ SUSR: 2.0000 mg | SUBCUTANEOUS | Status: DC

## 2012-04-19 NOTE — Patient Instructions (Signed)
The patient is instructed to continue all medications as prescribed. Schedule followup with check out clerk upon leaving the clinic  

## 2012-04-19 NOTE — Progress Notes (Signed)
Subjective:    Patient ID: Alejandro Koch, male    DOB: 02-Dec-1950, 62 y.o.   MRN: [id number]  HPI Patient with history of adult onset diabetes and fatty liver presents for followup of hypertension diabetes and prior liver disease.  He continues to have right flank pain Weight is stable Blood pressure stable No reported chest pain   Review of Systems  Constitutional: Positive for fatigue. Negative for fever.  HENT: Negative for hearing loss, congestion, neck pain and postnasal drip.   Eyes: Negative for discharge, redness and visual disturbance.  Respiratory: Negative for cough, shortness of breath and wheezing.   Cardiovascular: Negative for leg swelling.  Gastrointestinal: Positive for abdominal pain and abdominal distention. Negative for constipation.  Genitourinary: Negative for urgency and frequency.  Musculoskeletal: Negative for joint swelling and arthralgias.  Skin: Negative for color change and rash.  Neurological: Positive for weakness. Negative for light-headedness.  Hematological: Negative for adenopathy.  Psychiatric/Behavioral: Negative for behavioral problems.   Past Medical History  Diagnosis Date  . Allergy   . History of prostate cancer   . Depression   . GERD (gastroesophageal reflux disease)   . Hyperlipidemia   . Hypertension     History   Social History  . Marital Status: Married    Spouse Name: N/A    Number of Children: N/A  . Years of Education: N/A   Occupational History  . Not on file.   Social History Main Topics  . Smoking status: Former Smoker    Quit date: 03/18/1983  . Smokeless tobacco: Not on file  . Alcohol Use: No  . Drug Use: No  . Sexually Active: Yes   Other Topics Concern  . Not on file   Social History Narrative  . No narrative on file    Past Surgical History  Procedure Date  . Colonoscopy 2008    Family History  Problem Relation Age of Onset  . Leukemia    . Alzheimer's disease Mother   . Heart disease  Father   . COPD Father     Allergies  Allergen Reactions  . Iohexol      Code: HIVES, Desc: PT IS ALLERGIC TO IVP DYE AS NOTED IN PREVIOUS RECORDS IN PACS 02/2009/RM, Onset Date: [id number]     Current Outpatient Prescriptions on File Prior to Visit  Medication Sig Dispense Refill  . ALPRAZolam (XANAX) 0.5 MG tablet Take 1 tablet (0.5 mg total) by mouth 3 (three) times daily as needed.  30 tablet  3  . CRESTOR 10 MG tablet TAKE 1 TABLET BY MOUTH DAILY  30 tablet  5  . hydrocortisone-pramoxine (ANALPRAM-HC) 2.5-1 % rectal cream Place rectally 3 (three) times daily.  30 g  0  . ketorolac (ACULAR) 0.5 % ophthalmic solution USE AS DIRECTED  5 mL  1  . NEXIUM 40 MG capsule TAKE 1 CAPSULE TWICE DAILY  60 capsule  8  . olmesartan-hydrochlorothiazide (BENICAR HCT) 20-12.5 MG per tablet Take 1 tablet by mouth daily.  90 tablet  1  . ONGLYZA 5 MG TABS tablet TAKE ONE BY MOUTH DAILY  30 tablet  0  . Tamsulosin HCl (FLOMAX) 0.4 MG CAPS Take 0.4 mg by mouth daily after supper.        . temazepam (RESTORIL) 30 MG capsule Take 1 capsule (30 mg total) by mouth at bedtime as needed.  30 capsule  5  . temazepam (RESTORIL) 30 MG capsule Take 30 mg by mouth at bedtime as needed.      Marland Kitchen  temazepam (RESTORIL) 30 MG capsule TAKE 1 CAPSULE BY MOUTH AT BEDTIME AS NEEDED FOR SLEEP  30 capsule  5  . temazepam (RESTORIL) 30 MG capsule TAKE 1 CAPSULE BY MOUTH AT BEDTIME AS NEEDED FOR SLEEP  30 capsule  5  . TESTIM 50 MG/5GM GEL APPLY 2 TUBES ONCE A DAY AS DIRECTED  300 g  1  . zolpidem (AMBIEN) 10 MG tablet TAKE 1 TABLET BY MOUTH AT BEDTIME  20 tablet  2    BP 142/88  Temp 97.9 F (36.6 C) (Oral)  Wt 290 lb (131.543 kg)        Objective:   Physical Exam  Nursing note and vitals reviewed. Constitutional: He appears well-developed and well-nourished.  HENT:  Head: Normocephalic and atraumatic.  Eyes: Conjunctivae normal are normal. Pupils are equal, round, and reactive to light.  Neck: Normal range of  motion. Neck supple.  Cardiovascular: Normal rate and regular rhythm.   Pulmonary/Chest: Effort normal and breath sounds normal.  Abdominal: Soft. Bowel sounds are normal.          Assessment & Plan:  Weight gain and loss of muscle mass Does not monitor A1C Last A1c was 8.4 Start with Bydureon

## 2012-04-27 ENCOUNTER — Telehealth: Payer: Self-pay | Admitting: *Deleted

## 2012-04-27 ENCOUNTER — Encounter: Payer: Self-pay | Admitting: Internal Medicine

## 2012-04-27 ENCOUNTER — Ambulatory Visit (AMBULATORY_SURGERY_CENTER): Payer: BC Managed Care – PPO | Admitting: *Deleted

## 2012-04-27 VITALS — Ht 77.0 in | Wt 291.8 lb

## 2012-04-27 DIAGNOSIS — Z1211 Encounter for screening for malignant neoplasm of colon: Secondary | ICD-10-CM

## 2012-04-27 MED ORDER — MOVIPREP 100 G PO SOLR: ORAL | Status: DC

## 2012-04-27 NOTE — Telephone Encounter (Signed)
Have him check with his prescribing physician as to the best way to handle that. Then have that documented, in the chart, please. I appreciate you following up this issue.

## 2012-04-27 NOTE — Telephone Encounter (Signed)
Dr. Marina Goodell: pt is scheduled for recall colon on 2/25.  Pt is now taking Bydureon for diabetes: injected 1 time weekly. He is due to take it the day before his procedure.  This medication is not covered by our diabetic protocol instructions in [facility].  His diabetes in managed by Dr. Darryll Capers.  Please advise.  Thanks, Olegario Messier

## 2012-04-27 NOTE — Telephone Encounter (Signed)
Unable to reach pt.  Will try 2/12

## 2012-04-28 ENCOUNTER — Ambulatory Visit (INDEPENDENT_AMBULATORY_CARE_PROVIDER_SITE_OTHER): Payer: BC Managed Care – PPO | Admitting: Family Medicine

## 2012-04-28 VITALS — BP 118/80 | HR 90 | Temp 97.9°F | Resp 16 | Ht 76.0 in | Wt 288.0 lb

## 2012-04-28 DIAGNOSIS — R0981 Nasal congestion: Secondary | ICD-10-CM

## 2012-04-28 DIAGNOSIS — J3489 Other specified disorders of nose and nasal sinuses: Secondary | ICD-10-CM

## 2012-04-28 DIAGNOSIS — R52 Pain, unspecified: Secondary | ICD-10-CM

## 2012-04-28 LAB — POCT INFLUENZA A/B: Influenza B, POC: NEGATIVE

## 2012-04-28 MED ORDER — CEFDINIR 300 MG PO CAPS: 300.0000 mg | ORAL_CAPSULE | Freq: Two times a day (BID) | ORAL | Status: DC

## 2012-04-28 NOTE — Progress Notes (Signed)
[facility] [address] [place] [address] [phone]  Date:  04/28/2012   Name:  Alejandro Koch   DOB:  1950/09/30   MRN:  [id number]  PCP:  Carrie Mew, MD    Chief Complaint: Sinusitis and Generalized Body Aches   History of Present Illness:  Alejandro Koch is a 62 y.o. very pleasant male patient who presents with the following:  Here today with illness. He is here with sinus symptoms- he has noted a stuffy head, achy feeling in his body, runny nose.  No cough.    He has not noted a fever.  He is going on a trip this weekend and wanted to be sure he was ok and have abx if possible so he will not be ill for his trip.    No ST.  He has not noted chills.  No GI symptoms.   He has been eating ok.    He has a known history of miltifocal PVCs and irregular heartrate which has been stable for years.  He has a history of DM, HTN, high cholesterol.    Patient Active Problem List  Diagnosis  . PLANTAR WART  . DIABETES MELLITUS, CONTROLLED  . HYPOGONADISM  . HYPERLIPIDEMIA  . DEPRESSION  . HYPERTENSION  . ALLERGIC RHINITIS  . GERD  . INSOMNIA  . Abdominal Pain, Right Upper Quadrant  . PROSTATE CANCER, HX OF  . FATTY LIVER DISEASE    Past Medical History  Diagnosis Date  . Allergy   . History of prostate cancer   . Depression   . GERD (gastroesophageal reflux disease)   . Hyperlipidemia   . Hypertension   . Glaucoma   . Diabetes   . Kidney stones     Past Surgical History  Procedure Laterality Date  . Colonoscopy  2008  . Finger surgery  2013    infection 2nd finger left hand    History  Substance Use Topics  . Smoking status: Former Smoker    Quit date: 03/18/1983  . Smokeless tobacco: Never Used  . Alcohol Use: No    Family History  Problem Relation Age of Onset  . Leukemia    . Alzheimer's disease Mother   . Heart disease Father   . COPD Father   . Colon cancer Father 92  . Stomach cancer Neg Hx      Allergies  Allergen Reactions  . Iohexol      Code: HIVES, Desc: PT IS ALLERGIC TO IVP DYE AS NOTED IN PREVIOUS RECORDS IN PACS 02/2009/RM, Onset Date: 29562130     Medication list has been reviewed and updated.  Current Outpatient Prescriptions on File Prior to Visit  Medication Sig Dispense Refill  . ALPRAZolam (XANAX) 0.5 MG tablet Take 1 tablet (0.5 mg total) by mouth 3 (three) times daily as needed.  30 tablet  3  . CRESTOR 10 MG tablet TAKE 1 TABLET BY MOUTH DAILY  30 tablet  5  . Exenatide (BYDUREON) 2 MG SUSR Inject 2 mg into the skin once a week.  4 each  11  . hydrocortisone-pramoxine (ANALPRAM-HC) 2.5-1 % rectal cream Place rectally 3 (three) times daily.  30 g  0  . ketorolac (ACULAR) 0.5 % ophthalmic solution USE AS DIRECTED  5 mL  1  . MOVIPREP 100 G SOLR moviprep as directed. No substitutions  1 kit  0  . NEXIUM 40 MG capsule TAKE 1 CAPSULE TWICE DAILY  60 capsule  8  . olmesartan-hydrochlorothiazide (BENICAR HCT) 20-12.5 MG per tablet Take 1 tablet by mouth daily.  90 tablet  1  . Tamsulosin HCl (FLOMAX) 0.4 MG CAPS Take 0.4 mg by mouth daily after supper.        . temazepam (RESTORIL) 30 MG capsule Take 1 capsule (30 mg total) by mouth at bedtime as needed.  30 capsule  5  . TESTIM 50 MG/5GM GEL APPLY 2 TUBES ONCE A DAY AS DIRECTED  300 g  1  . zolpidem (AMBIEN) 10 MG tablet TAKE 1 TABLET BY MOUTH AT BEDTIME  20 tablet  2   No current facility-administered medications on file prior to visit.    Review of Systems:  As per HPI- otherwise negative.   Physical Examination: Filed Vitals:   04/28/12 1145  BP: 101/69  Pulse: 108  Temp: 97.9 F (36.6 C)  Resp: 16   Filed Vitals:   04/28/12 1145  Height: 6\' 4"  (1.93 m)  Weight: 288 lb (130.636 kg)   Body mass index is 35.07 kg/(m^2). Ideal Body Weight: Weight in (lb) to have BMI = 25: 205  GEN: WDWN, NAD, Non-toxic, A & O x 3, obese HEENT: Atraumatic, Normocephalic. Neck supple. No masses, No  LAD. Bilateral TM wnl, oropharynx normal.  PEERL,EOMI.  Nasal cavity is inflamed and congested Ears and Nose: No external deformity. CV: irregular rhythm consistent with PVCs, No M/G/R. No JVD. No thrill. No extra heart sounds. PULM: CTA B, no wheezes, crackles, rhonchi. No retractions. No resp. distress. No accessory muscle use. EXTR: No c/c/e NEURO Normal gait.  PSYCH: Normally interactive. Conversant. Not depressed or anxious appearing.  Calm demeanor.   Results for orders placed in visit on 04/28/12  POCT INFLUENZA A/B      Result Value Range   Influenza A, POC Negative     Influenza B, POC Negative      Assessment and Plan: 1. Body aches  POCT Influenza A/B   POCT Influenza A/B  2. Nasal congestion  POCT Influenza A/B   POCT Influenza A/B   cefdinir (OMNICEF) 300 MG capsule   Advised he likely has a viral URI- did give him an omnicef rx to have on hand if he is not better in the next couple of days.  He is aware of his PVCs- this is not new to him    Abbe Amsterdam, MD

## 2012-04-28 NOTE — Telephone Encounter (Signed)
04-28-12 spoke with pt at 0750 and explained that we advice from dr Lovell Sheehan about his injectable diabetes medicine, whether he can have the injection the day before the procedure or if it needs to wait since he can only have liquids, etc and pt will call jenkins, get info and call us back with what needs to be done per dr perry's request. ewm

## 2012-04-28 NOTE — Patient Instructions (Signed)
Let us know if you are not better in the next few days, Sooner if worse.

## 2012-04-30 ENCOUNTER — Other Ambulatory Visit: Payer: BC Managed Care – PPO

## 2012-05-05 NOTE — Telephone Encounter (Addendum)
Pls call Ezra Sites directly with the information about the Bydureon injection.  Does the patient need to take injection prior to procedure as he normally does or to hold off? Pt will be on clear liquids the day before procedure.  Ezra Sites no. [phone] Pt said ok to leave message on his phone.

## 2012-05-05 NOTE — Telephone Encounter (Signed)
Pt is waiting to hear from someone about his injectable diabetes medicine, whether he can have the injection the day before procedure or if he needs too wait. Pt thought they were to call Dr Lovell Sheehan. Pls advise.

## 2012-05-05 NOTE — Telephone Encounter (Signed)
I talked with pt Monday 2/17 and told him again (per Dr Lamar Sprinkles instructions) that he needed to call Dr. Lovell Sheehan office for instructions for Bydureon injection before procedure.  Procedure scheduled for 2/25 and he is due to take injection the day before his procedure.

## 2012-05-06 ENCOUNTER — Other Ambulatory Visit: Payer: Self-pay | Admitting: *Deleted

## 2012-05-06 NOTE — Telephone Encounter (Signed)
Having colonoscopy on 2-25-please adviese

## 2012-05-07 ENCOUNTER — Ambulatory Visit: Payer: Self-pay | Admitting: Internal Medicine

## 2012-05-07 NOTE — Telephone Encounter (Signed)
Pt. Was informed by Dr. York Ram nurse that it was ok for him to take His Bydreon the day before his procedure.    The nurse also called and left a message with Darel Hong on 4th floor reception desk that Dr. Lovell Sheehan said it was ok for Mr. Obrecht to take his Gerrit Halls the day before his colon procedure.  Wyona Almas

## 2012-05-10 ENCOUNTER — Ambulatory Visit: Payer: Self-pay | Admitting: Internal Medicine

## 2012-05-11 ENCOUNTER — Encounter: Payer: Self-pay | Admitting: Internal Medicine

## 2012-05-11 ENCOUNTER — Ambulatory Visit (AMBULATORY_SURGERY_CENTER): Payer: BC Managed Care – PPO | Admitting: Internal Medicine

## 2012-05-11 ENCOUNTER — Other Ambulatory Visit: Payer: Self-pay | Admitting: Internal Medicine

## 2012-05-11 VITALS — BP 149/87 | HR 68 | Temp 97.4°F | Resp 19 | Ht 77.0 in | Wt 291.0 lb

## 2012-05-11 DIAGNOSIS — Z800 Family history of malignant neoplasm of digestive organs: Secondary | ICD-10-CM

## 2012-05-11 LAB — GLUCOSE, CAPILLARY: Glucose-Capillary: 139 mg/dL — ABNORMAL HIGH (ref 70–99)

## 2012-05-11 MED ORDER — SODIUM CHLORIDE 0.9 % IV SOLN: 500.0000 mL | INTRAVENOUS | Status: DC
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
## 2012-05-11 NOTE — Patient Instructions (Addendum)
YOU HAD AN ENDOSCOPIC PROCEDURE TODAY AT THE [facility]: Refer to the procedure report that was given to you for any specific questions about what was found during the examination.  If the procedure report does not answer your questions, please call your gastroenterologist to clarify.  If you requested that your care partner not be given the details of your procedure findings, then the procedure report has been included in a sealed envelope for you to review at your convenience later.  YOU SHOULD EXPECT: Some feelings of bloating in the abdomen. Passage of more gas than usual.  Walking can help get rid of the air that was put into your GI tract during the procedure and reduce the bloating. If you had a lower endoscopy (such as a colonoscopy or flexible sigmoidoscopy) you may notice spotting of blood in your stool or on the toilet paper. If you underwent a bowel prep for your procedure, then you may not have a normal bowel movement for a few days.  DIET: Your first meal following the procedure should be a light meal and then it is ok to progress to your normal diet.  A half-sandwich or bowl of soup is an example of a good first meal.  Heavy or fried foods are harder to digest and may make you feel nauseous or bloated.  Likewise meals heavy in dairy and vegetables can cause extra gas to form and this can also increase the bloating.  Drink plenty of fluids but you should avoid alcoholic beverages for 24 hours.  ACTIVITY: Your care partner should take you home directly after the procedure.  You should plan to take it easy, moving slowly for the rest of the day.  You can resume normal activity the day after the procedure however you should NOT DRIVE or use heavy machinery for 24 hours (because of the sedation medicines used during the test).    SYMPTOMS TO REPORT IMMEDIATELY: A gastroenterologist can be reached at any hour.  During normal business hours, 8:30 AM to 5:00 PM Monday through Friday,  call [phone].  After hours and on weekends, please call the GI answering service at [phone] who will take a message and have the physician on call contact you.   Following lower endoscopy (colonoscopy or flexible sigmoidoscopy):  Excessive amounts of blood in the stool  Significant tenderness or worsening of abdominal pains  Swelling of the abdomen that is new, acute  Fever of 100F or higher  FOLLOW UP: If any biopsies were taken you will be contacted by phone or by letter within the next 1-3 weeks.  Call your gastroenterologist if you have not heard about the biopsies in 3 weeks.  Our staff will call the home number listed on your records the next business day following your procedure to check on you and address any questions or concerns that you may have at that time regarding the information given to you following your procedure. This is a courtesy call and so if there is no answer at the home number and we have not heard from you through the emergency physician on call, we will assume that you have returned to your regular daily activities without incident.  SIGNATURES/CONFIDENTIALITY: You and/or your care partner have signed paperwork which will be entered into your electronic medical record.  These signatures attest to the fact that that the information above on your After Visit Summary has been reviewed and is understood.  Full responsibility of the confidentiality of this   discharge information lies with you and/or your care-partner.   Diverticulosis-handout given  High Fiber Diet-handout given  Repeat colonoscopy in 5 years  

## 2012-05-11 NOTE — Op Note (Signed)
[facility] [address] [place], [address]   COLONOSCOPY PROCEDURE REPORT  PATIENT: Alejandro Koch, Alejandro Koch  MR#: [id number] BIRTHDATE: 02/28/1951 , 62  yrs. old GENDER: Male ENDOSCOPIST: Roxy Cedar, MD REFERRED [id number] Program Recall PROCEDURE DATE:  05/11/2012 PROCEDURE:   Colonoscopy, surveillance ASA CLASS:   Class II INDICATIONS:Patient's personal history of adenomatous colon polyps (1998,2004,2008,2011)and Patient's immediate family history of colon cancer (parent 23). MEDICATIONS: MAC sedation, administered by CRNA and propofol (Diprivan) 400mg  IV  DESCRIPTION OF PROCEDURE:   After the risks benefits and alternatives of the procedure were thoroughly explained, informed consent was obtained.  A digital rectal exam revealed no abnormalities of the rectum.   The LB [id number]  endoscope was introduced through the anus and advanced to the cecum, which was identified by both the appendix and ileocecal valve. No adverse events experienced.   The quality of the prep was adequate, using MoviPrep  The instrument was then slowly withdrawn as the colon was fully examined.      COLON FINDINGS: Moderate diverticulosis was noted in the sigmoid colon.   The colon mucosa was otherwise normal.  Retroflexed views revealed internal hemorrhoids. The time to cecum=1 minutes 47 seconds.  Withdrawal time=11 minutes 08 seconds.  The scope was withdrawn and the procedure completed. COMPLICATIONS: There were no complications.  ENDOSCOPIC IMPRESSION: 1.   Moderate diverticulosis was noted in the sigmoid colon 2.   The colon mucosa was otherwise normal  RECOMMENDATIONS: 1. Follow up colonoscopy in 5 years   eSigned:  Roxy Cedar, MD 05/11/2012 2:52 PM   cc: Stacie Glaze, MD and The Patient

## 2012-05-12 ENCOUNTER — Telehealth: Payer: Self-pay | Admitting: *Deleted

## 2012-05-12 NOTE — Telephone Encounter (Signed)
Name identifier, left message, follow-up 

## 2012-05-14 ENCOUNTER — Other Ambulatory Visit: Payer: Self-pay | Admitting: Internal Medicine

## 2012-06-10 ENCOUNTER — Encounter: Payer: Self-pay | Admitting: Internal Medicine

## 2012-06-10 ENCOUNTER — Ambulatory Visit (HOSPITAL_COMMUNITY)
Admission: RE | Admit: 2012-06-10 | Discharge: 2012-06-10 | Disposition: A | Discharge status: Home / Self Care | Payer: BC Managed Care – PPO | Source: Ambulatory Visit | Attending: Internal Medicine | Admitting: Internal Medicine

## 2012-06-10 ENCOUNTER — Other Ambulatory Visit: Payer: Self-pay | Admitting: Family Medicine

## 2012-06-10 ENCOUNTER — Other Ambulatory Visit: Payer: Self-pay | Admitting: Internal Medicine

## 2012-06-10 ENCOUNTER — Telehealth: Payer: Self-pay | Admitting: Internal Medicine

## 2012-06-10 ENCOUNTER — Ambulatory Visit (INDEPENDENT_AMBULATORY_CARE_PROVIDER_SITE_OTHER): Payer: BC Managed Care – PPO | Admitting: Internal Medicine

## 2012-06-10 VITALS — BP 126/80 | HR 69 | Resp 18 | Wt 284.0 lb

## 2012-06-10 DIAGNOSIS — M5030 Other cervical disc degeneration, unspecified cervical region: Secondary | ICD-10-CM | POA: Insufficient documentation

## 2012-06-10 DIAGNOSIS — M47812 Spondylosis without myelopathy or radiculopathy, cervical region: Secondary | ICD-10-CM | POA: Insufficient documentation

## 2012-06-10 DIAGNOSIS — M542 Cervicalgia: Secondary | ICD-10-CM

## 2012-06-10 DIAGNOSIS — I10 Essential (primary) hypertension: Secondary | ICD-10-CM

## 2012-06-10 DIAGNOSIS — J320 Chronic maxillary sinusitis: Secondary | ICD-10-CM | POA: Insufficient documentation

## 2012-06-10 MED ORDER — METHYLPREDNISOLONE ACETATE 80 MG/ML IJ SUSP
80.0000 mg | Freq: Once | INTRAMUSCULAR | Status: AC
  Administered 2012-06-10: 80 mg via INTRAMUSCULAR

## 2012-06-10 MED ORDER — CYCLOBENZAPRINE HCL 10 MG PO TABS: 10.0000 mg | ORAL_TABLET | Freq: Three times a day (TID) | ORAL | Status: DC | PRN

## 2012-06-10 MED ORDER — HYDROCODONE-ACETAMINOPHEN 7.5-300 MG PO TABS: 1.0000 | ORAL_TABLET | Freq: Four times a day (QID) | ORAL | Status: DC | PRN
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
## 2012-06-10 NOTE — Patient Instructions (Addendum)
Cervical MRI as discussed  Soft cervical collarCervical Sprain A cervical sprain is an injury in the neck in which the ligaments are stretched or torn. The ligaments are the tissues that hold the bones of the neck (vertebrae) in place.Cervical sprains can range from very mild to very severe. Most cervical sprains get better in 1 to 3 weeks, but it depends on the cause and extent of the injury. Severe cervical sprains can cause the neck vertebrae to be unstable. This can lead to damage of the spinal cord and can result in serious nervous system problems. Your caregiver will determine whether your cervical sprain is mild or severe. CAUSES  Severe cervical sprains may be caused by:  Contact sport injuries (football, rugby, wrestling, hockey, auto racing, gymnastics, diving, martial arts, boxing).  Motor vehicle collisions.  Whiplash injuries. This means the neck is forcefully whipped backward and forward.  Falls. Mild cervical sprains may be caused by:   Awkward positions, such as cradling a telephone between your ear and shoulder.  Sitting in a chair that does not offer proper support.  Working at a poorly Marketing executive station.  Activities that require looking up or down for long periods of time. SYMPTOMS   Pain, soreness, stiffness, or a burning sensation in the front, back, or sides of the neck. This discomfort may develop immediately after injury or it may develop slowly and not begin for 24 hours or more after an injury.  Pain or tenderness directly in the middle of the back of the neck.  Shoulder or upper back pain.  Limited ability to move the neck.  Headache.  Dizziness.  Weakness, numbness, or tingling in the hands or arms.  Muscle spasms.  Difficulty swallowing or chewing.  Tenderness and swelling of the neck. DIAGNOSIS  Most of the time, your caregiver can diagnose this problem by taking your history and doing a physical exam. Your caregiver will ask about  any known problems, such as arthritis in the neck or a previous neck injury. X-rays may be taken to find out if there are any other problems, such as problems with the bones of the neck. However, an X-ray often does not reveal the full extent of a cervical sprain. Other tests such as a computed tomography (CT) scan or magnetic resonance imaging (MRI) may be needed. TREATMENT  Treatment depends on the severity of the cervical sprain. Mild sprains can be treated with rest, keeping the neck in place (immobilization), and pain medicines. Severe cervical sprains need immediate immobilization and an appointment with an orthopedist or neurosurgeon. Several treatment options are available to help with pain, muscle spasms, and other symptoms. Your caregiver may prescribe:  Medicines, such as pain relievers, numbing medicines, or muscle relaxants.  Physical therapy. This can include stretching exercises, strengthening exercises, and posture training. Exercises and improved posture can help stabilize the neck, strengthen muscles, and help stop symptoms from returning.  A neck collar to be worn for short periods of time. Often, these collars are worn for comfort. However, certain collars may be worn to protect the neck and prevent further worsening of a serious cervical sprain. HOME CARE INSTRUCTIONS   Put ice on the injured area.  Put ice in a plastic bag.  Place a towel between your skin and the bag.  Leave the ice on for 15 to 20 minutes, 3 to 4 times a day.  Only take over-the-counter or prescription medicines for pain, discomfort, or fever as directed by your caregiver.  Keep  all follow-up appointments as directed by your caregiver.  Keep all physical therapy appointments as directed by your caregiver.  If a neck collar is prescribed, wear it as directed by your caregiver.  Do not drive while wearing a neck collar.  Make any needed adjustments to your work station to promote good  posture.  Avoid positions and activities that make your symptoms worse.  Warm up and stretch before being active to help prevent problems. SEEK MEDICAL CARE IF:   Your pain is not controlled with medicine.  You are unable to decrease your pain medicine over time as planned.  Your activity level is not improving as expected. SEEK IMMEDIATE MEDICAL CARE IF:   You develop any bleeding, stomach upset, or signs of an allergic reaction to your medicine.  Your symptoms get worse.  You develop new, unexplained symptoms.  You have numbness, tingling, weakness, or paralysis in any part of your body. MAKE SURE YOU:   Understand these instructions.  Will watch your condition.  Will get help right away if you are not doing well or get worse. Document Released: 12/29/2006 Document Revised: 05/26/2011 Document Reviewed: 12/04/2010 [facility] Patient Information 2013 [place], [place].

## 2012-06-10 NOTE — Telephone Encounter (Signed)
Patient's prior auth for Crestor 10mg  is denied. Per insurance, pt must first try lovastatin, pravastatin, simvastatin, or atorvastatin. Please advise. Pt uses [facility] in [place].

## 2012-06-11 ENCOUNTER — Other Ambulatory Visit: Payer: Self-pay | Admitting: *Deleted

## 2012-06-11 ENCOUNTER — Other Ambulatory Visit: Payer: Self-pay | Admitting: Internal Medicine

## 2012-06-11 MED ORDER — ATORVASTATIN CALCIUM 20 MG PO TABS: 20.0000 mg | ORAL_TABLET | Freq: Every day | ORAL | Status: DC

## 2012-06-11 NOTE — Telephone Encounter (Signed)
Dr Lovell Sheehan changed to [facility]

## 2012-06-15 ENCOUNTER — Telehealth: Payer: Self-pay | Admitting: Internal Medicine

## 2012-06-15 NOTE — Telephone Encounter (Signed)
Patient is on Testim, however his new insurance prefers Androgel or Androderm. Do not think a PA will go through on the Testim since I do not show he has tried either of the preferred. Please advise.

## 2012-06-16 ENCOUNTER — Other Ambulatory Visit: Payer: Self-pay | Admitting: *Deleted

## 2012-06-16 ENCOUNTER — Telehealth: Payer: Self-pay | Admitting: Internal Medicine

## 2012-06-16 DIAGNOSIS — M4712 Other spondylosis with myelopathy, cervical region: Secondary | ICD-10-CM

## 2012-06-16 DIAGNOSIS — M5030 Other cervical disc degeneration, unspecified cervical region: Secondary | ICD-10-CM

## 2012-06-16 MED ORDER — TESTOSTERONE 20.25 MG/1.25GM (1.62%) TD GEL: 1.0000 "application " | Freq: Every day | TRANSDERMAL | Status: DC

## 2012-06-16 NOTE — Telephone Encounter (Signed)
Patient Information:  Caller Name: Brett Canales  Phone: [phone]  Patient: Alejandro Koch, Alejandro Koch  Gender: Male  DOB: 02-21-1951  Age: 62 Years  PCP: Eleonore Chiquito [facility])  Office Follow Up:  Does the office need to follow up with this patient?: Yes  Instructions For The Office: PLS READ RN NOTE:  RN Note:  PT following up from Office visit on 3-28.  Pt has been wearing the C-Collar and taking pain meds at night.  Pt states pain has improved since wearing C-Collar. Pt still hears a crunching sound and has an intermittent sharp pain when turning Neck to Left. The constant/chronic pain that Pt had when seen on 3-28 is gone but he still has the intermittent sharp pain when turning his Head to left or stepping down hard. Pt is currently asymptomatic. PLEASE REVIEW W/ DR Amador Cunas AND FOLLOW UP W/ PT.  Symptoms  Reason For Call & Symptoms: F/U Neck Pain, seen in office on 3-28.  Reviewed Health History In EMR: Yes  Reviewed Medications In EMR: Yes  Reviewed Allergies In EMR: Yes  Reviewed Surgeries / Procedures: Yes  Date of Onset of Symptoms: 06/11/2012  Treatments Tried: C-Collar , Flexeril, and Hydrocodone,  Treatments Tried Worked: Yes  Guideline(s) Used:  No Protocol Available - Information Only  Disposition Per Guideline:   Discuss with PCP and Callback by Nurse Today  Reason For Disposition Reached:   Nursing judgment  Advice Given:  N/A  Patient Will Follow Care Advice:  YES

## 2012-06-16 NOTE — Telephone Encounter (Signed)
Error

## 2012-06-17 NOTE — Telephone Encounter (Signed)
Spoke to pt asked him how he was doing? Pt stated doing better only has pain when he moves his neck to the left. Pt asked if Physical Therapy would be an option instead of seeing Neuro. Told pt I would check with Dr. Kirtland Bouchard and get back to him tomorrow.

## 2012-06-17 NOTE — Telephone Encounter (Signed)
Please call patient to check on his current status.  Ask whether he would like to consider neurosurgical evaluation and possible epidural for pain. Since he seems to be improving, can certainly give this some more time to improve without further intervention

## 2012-06-18 NOTE — Telephone Encounter (Signed)
Spoke to pt told him discussed PT with Dr.K and does not feel that would be good due to all the cervical disease and would like him to see Neurosurgeon to evaluate and see what they think. Pt verbalized understanding. Told pt I sent referral order for Neuro and someone will contact him. Pt verbalized understanding.

## 2012-06-18 NOTE — Telephone Encounter (Signed)
Discuss pt with Dr. Kirtland Bouchard he would like pt to see Neuro to evaluate and let them decide if PT would be okay, Dr.K does not think PT would be good. Left message on voicemail to call office.

## 2012-07-06 ENCOUNTER — Other Ambulatory Visit: Payer: Self-pay | Admitting: Internal Medicine

## 2012-07-21 ENCOUNTER — Other Ambulatory Visit (INDEPENDENT_AMBULATORY_CARE_PROVIDER_SITE_OTHER): Payer: BC Managed Care – PPO

## 2012-07-21 DIAGNOSIS — E785 Hyperlipidemia, unspecified: Secondary | ICD-10-CM

## 2012-07-21 DIAGNOSIS — E291 Testicular hypofunction: Secondary | ICD-10-CM

## 2012-07-21 DIAGNOSIS — E1059 Type 1 diabetes mellitus with other circulatory complications: Secondary | ICD-10-CM

## 2012-07-21 LAB — LIPID PANEL
Cholesterol: 129 mg/dL (ref 0–200)
HDL: 41 mg/dL (ref >39.00)
VLDL: 20.2 mg/dL (ref 0.0–40.0)

## 2012-07-21 LAB — TESTOSTERONE: Testosterone: 291.75 ng/dL — ABNORMAL LOW (ref 350.00–890.00)

## 2012-07-28 ENCOUNTER — Ambulatory Visit: Payer: BC Managed Care – PPO | Admitting: Internal Medicine

## 2012-07-29 ENCOUNTER — Ambulatory Visit (INDEPENDENT_AMBULATORY_CARE_PROVIDER_SITE_OTHER): Payer: BC Managed Care – PPO | Admitting: Internal Medicine

## 2012-07-29 ENCOUNTER — Encounter: Payer: Self-pay | Admitting: Internal Medicine

## 2012-07-29 ENCOUNTER — Other Ambulatory Visit: Payer: Self-pay | Admitting: Internal Medicine

## 2012-07-29 VITALS — BP 140/80 | HR 72 | Temp 98.2°F | Resp 16 | Ht 77.0 in | Wt 278.0 lb

## 2012-07-29 DIAGNOSIS — E119 Type 2 diabetes mellitus without complications: Secondary | ICD-10-CM

## 2012-07-29 DIAGNOSIS — I10 Essential (primary) hypertension: Secondary | ICD-10-CM

## 2012-07-29 DIAGNOSIS — E785 Hyperlipidemia, unspecified: Secondary | ICD-10-CM

## 2012-07-29 NOTE — Patient Instructions (Signed)
The patient is instructed to continue all medications as prescribed. Schedule followup with check out clerk upon leaving the clinic  

## 2012-07-29 NOTE — Progress Notes (Signed)
Subjective:    Patient ID: Alejandro Koch, male    DOB: 1951-03-04, 62 y.o.   MRN: [id number]  Diabetes Pertinent negatives for diabetes include no fatigue and no weakness.  Hypertension Pertinent negatives include no neck pain or shortness of breath.  Hyperlipidemia Pertinent negatives include no shortness of breath.  Gastrophageal Reflux He reports no abdominal pain, no coughing or no wheezing. Pertinent negatives include no fatigue.   This is a 62 year old man followed for diabetes and hypertension and hyperlipidemia.  His lipids are now: His A1c is stopped over 2.7.2 on injection therapy.  He has decreased abdominal pain decreased liver pain with medications.     Review of Systems  Constitutional: Negative for fever and fatigue.  HENT: Negative for hearing loss, congestion, neck pain and postnasal drip.   Eyes: Negative for discharge, redness and visual disturbance.  Respiratory: Negative for cough, shortness of breath and wheezing.   Cardiovascular: Negative for leg swelling.  Gastrointestinal: Negative for abdominal pain, constipation and abdominal distention.  Genitourinary: Negative for urgency and frequency.  Musculoskeletal: Negative for joint swelling and arthralgias.  Skin: Negative for color change and rash.  Neurological: Negative for weakness and light-headedness.  Hematological: Negative for adenopathy.  Psychiatric/Behavioral: Negative for behavioral problems.   Past Medical History  Diagnosis Date  . Allergy   . History of prostate cancer   . Depression   . GERD (gastroesophageal reflux disease)   . Hyperlipidemia   . Hypertension   . Glaucoma   . Diabetes   . Kidney stones     History   Social History  . Marital Status: Married    Spouse Name: N/A    Number of Children: N/A  . Years of Education: N/A   Occupational History  . Not on file.   Social History Main Topics  . Smoking status: Former Smoker    Quit date: 03/18/1983  .  Smokeless tobacco: Never Used  . Alcohol Use: No  . Drug Use: No  . Sexually Active: Yes   Other Topics Concern  . Not on file   Social History Narrative  . No narrative on file    Past Surgical History  Procedure Laterality Date  . Colonoscopy  2008  . Finger surgery  2013    infection 2nd finger left hand    Family History  Problem Relation Age of Onset  . Leukemia    . Alzheimer's disease Mother   . Heart disease Father   . COPD Father   . Colon cancer Father 43  . Stomach cancer Neg Hx     Allergies  Allergen Reactions  . Iohexol      Code: HIVES, Desc: PT IS ALLERGIC TO IVP DYE AS NOTED IN PREVIOUS RECORDS IN PACS 02/2009/RM, Onset Date: [id number]     Current Outpatient Prescriptions on File Prior to Visit  Medication Sig Dispense Refill  . ALPRAZolam (XANAX) 0.5 MG tablet TAKE 1 TABLET BY MOUTH 3 TIMES A DAY AS NEEDED  30 tablet  5  . atorvastatin (LIPITOR) 20 MG tablet Take 1 tablet (20 mg total) by mouth daily.  90 tablet  3  . BENICAR HCT 20-12.5 MG per tablet TAKE 1 TABLET BY MOUTH DAILY.  90 tablet  1  . BENICAR HCT 20-12.5 MG per tablet TAKE 1 TABLET BY MOUTH DAILY.  90 tablet  1  . cyclobenzaprine (FLEXERIL) 10 MG tablet Take 1 tablet (10 mg total) by mouth 3 (three) times daily as needed for muscle  spasms.  30 tablet  0  . Exenatide (BYDUREON) 2 MG SUSR Inject 2 mg into the skin once a week.  4 each  11  . Hydrocodone-Acetaminophen 7.5-300 MG TABS Take 1 tablet by mouth 4 (four) times daily as needed.  60 each  0  . hydrocortisone-pramoxine (ANALPRAM-HC) 2.5-1 % rectal cream Place 1 application rectally 3 (three) times daily as needed.      Marland Kitchen ketorolac (ACULAR) 0.5 % ophthalmic solution USE AS DIRECTED  5 mL  1  . NEXIUM 40 MG capsule TAKE 1 CAPSULE TWICE DAILY  60 capsule  5  . Tamsulosin HCl (FLOMAX) 0.4 MG CAPS Take 0.4 mg by mouth daily after supper.        . temazepam (RESTORIL) 30 MG capsule TAKE 1 CAPSULE BY MOUTH AT BEDTIME AS NEEDED FOR SLEEP  30  capsule  3  . Testosterone (ANDROGEL) 20.25 MG/1.25GM (1.62%) GEL Place 1 application onto the skin daily.  1.25 g  5  . zolpidem (AMBIEN) 10 MG tablet TAKE 1 TABLET BY MOUTH AT BEDTIME  20 tablet  2   No current facility-administered medications on file prior to visit.    BP 140/80  Pulse 72  Temp(Src) 98.2 F (36.8 C)  Resp 16  Ht 6\' 5"  (1.956 m)  Wt 278 lb (126.1 kg)  BMI 32.96 kg/m2       Objective:   Physical Exam  Constitutional: He appears well-developed and well-nourished.  HENT:  Head: Normocephalic and atraumatic.  Eyes: Conjunctivae are normal. Pupils are equal, round, and reactive to light.  Neck: Normal range of motion. Neck supple.  Cardiovascular: Normal rate and regular rhythm.   Pulmonary/Chest: Effort normal and breath sounds normal.  Abdominal: Soft. Bowel sounds are normal.          Assessment & Plan:  Increase testosterone to one tube a day.  Lipids are at goal and her medications.  Continue Crestor we will supply samples since his insurance company is changing her formulary.  Blood pressure stable on current medications.  Diabetes closer to absolute goal continue weight loss with a goal of additional 20 pounds

## 2012-07-29 NOTE — Assessment & Plan Note (Signed)
Weight management CBG's are stable Diet changes have resulted in weight loss and better control  The bydureon has helped to lower A1C  Is now 7.3 from 9.8

## 2012-07-29 NOTE — Assessment & Plan Note (Signed)
Weight loss and improvement in lipid panel still has not reached goal

## 2012-07-29 NOTE — Assessment & Plan Note (Signed)
Stable HTN

## 2012-07-30 ENCOUNTER — Other Ambulatory Visit: Payer: Self-pay | Admitting: *Deleted

## 2012-07-30 MED ORDER — TAMSULOSIN HCL 0.4 MG PO CAPS: 0.4000 mg | ORAL_CAPSULE | ORAL | Status: DC

## 2012-07-30 MED ORDER — HYDROCORTISONE ACE-PRAMOXINE 2.5-1 % RE CREA: 1.0000 "application " | TOPICAL_CREAM | Freq: Three times a day (TID) | RECTAL | Status: DC | PRN

## 2012-10-06 IMAGING — CR DG HAND COMPLETE 3+V*L*
3 series · 3 of 3 positions shown · non-contrast
Comparison: None.

CLINICAL DATA: Left index finger pain and swelling.  Evaluate for
metallic foreign body.

LEFT HAND - COMPLETE 3+ VIEW

[view not recorded (1 of 3)]
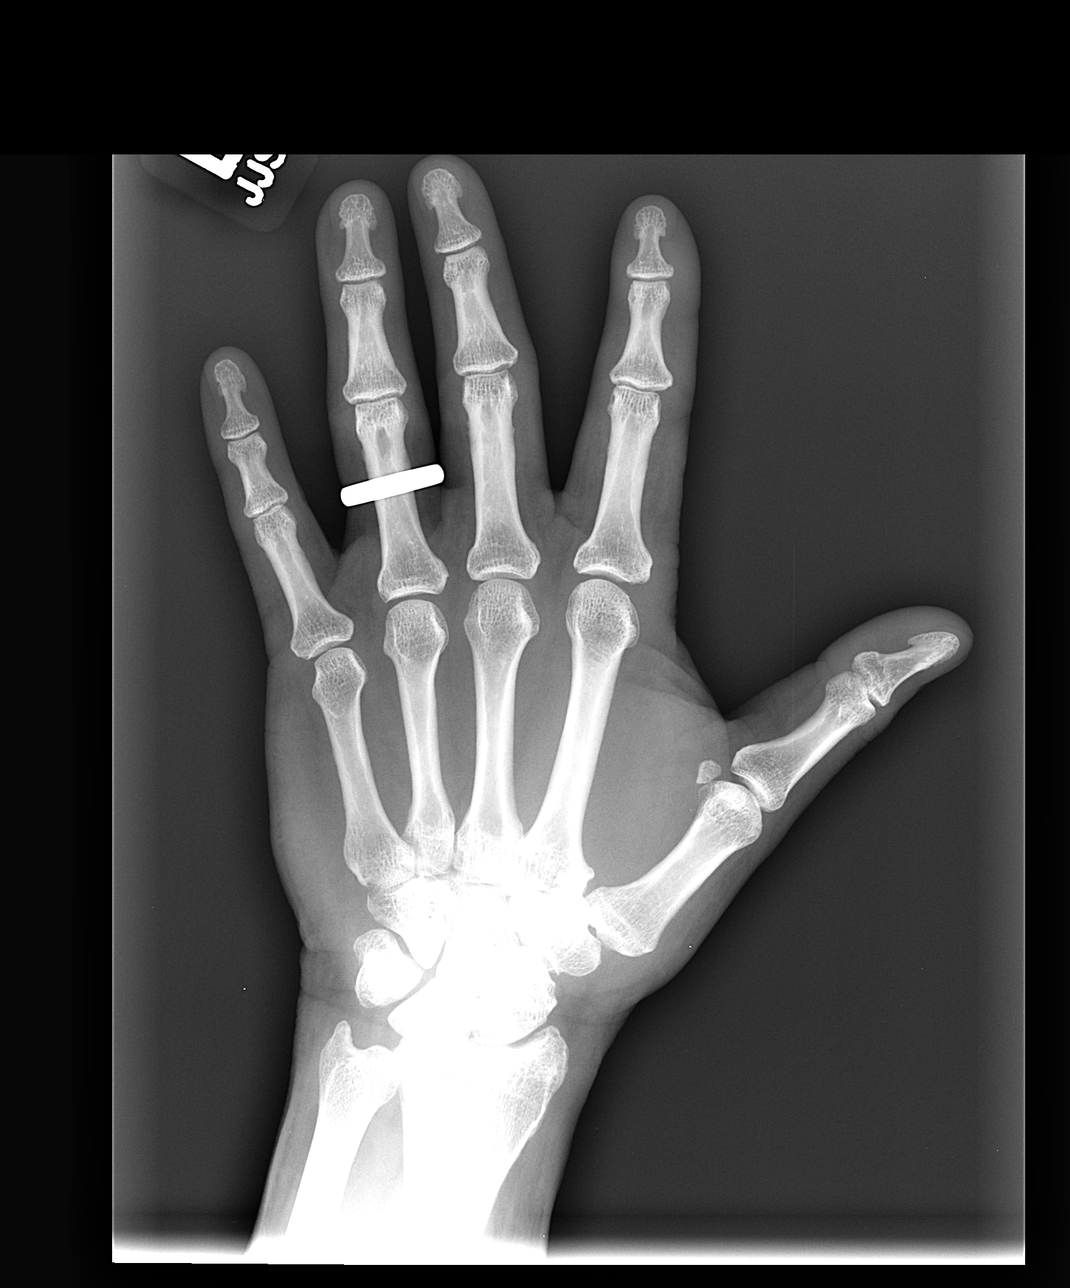

[view not recorded (2 of 3)]
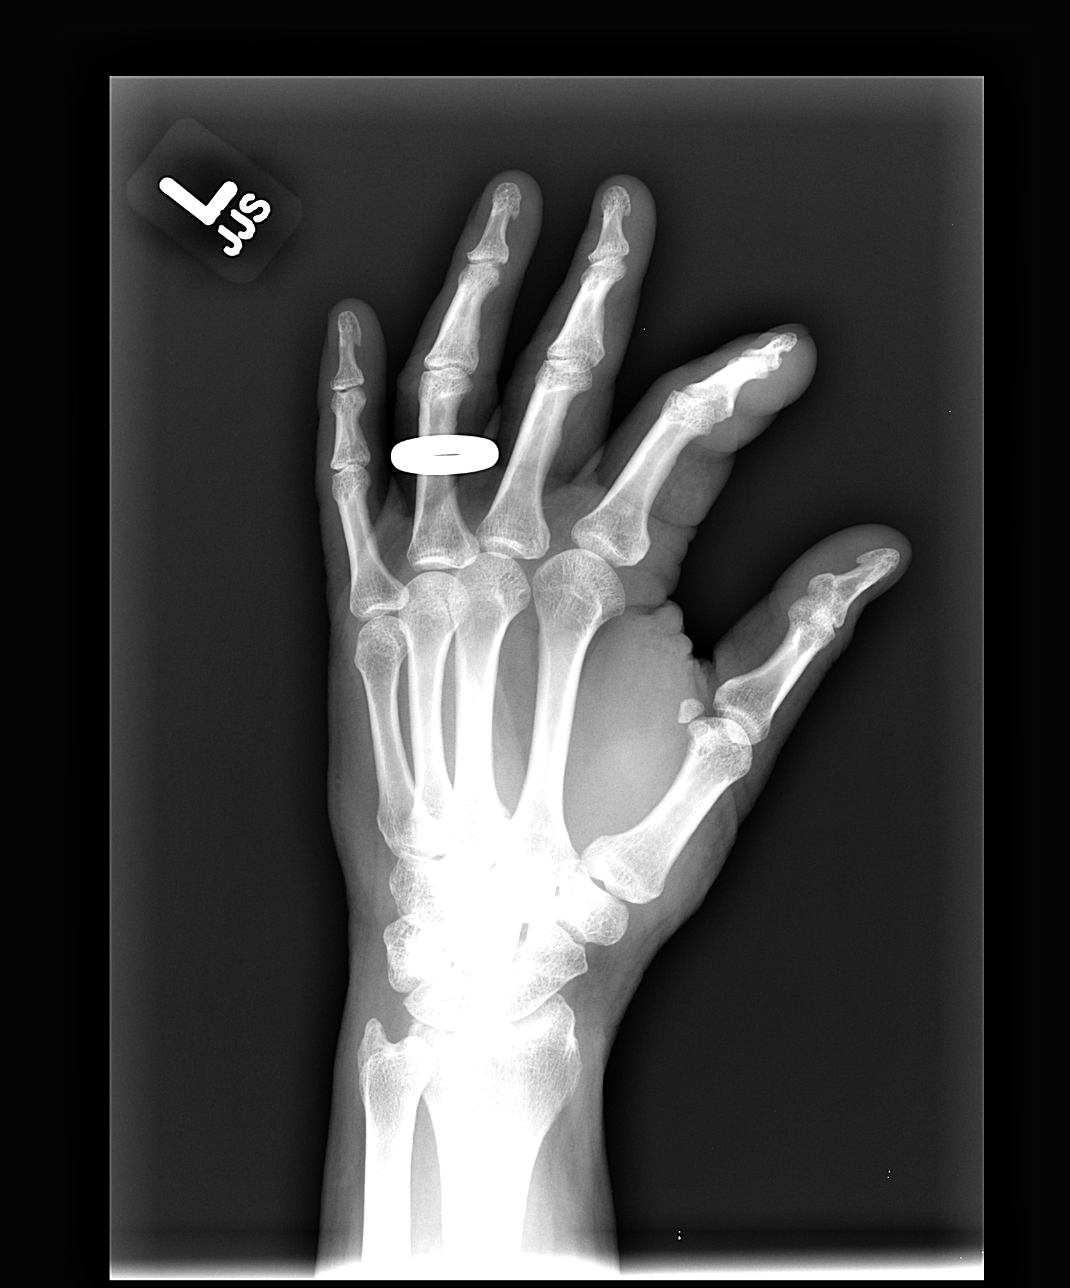

[view not recorded (3 of 3)]
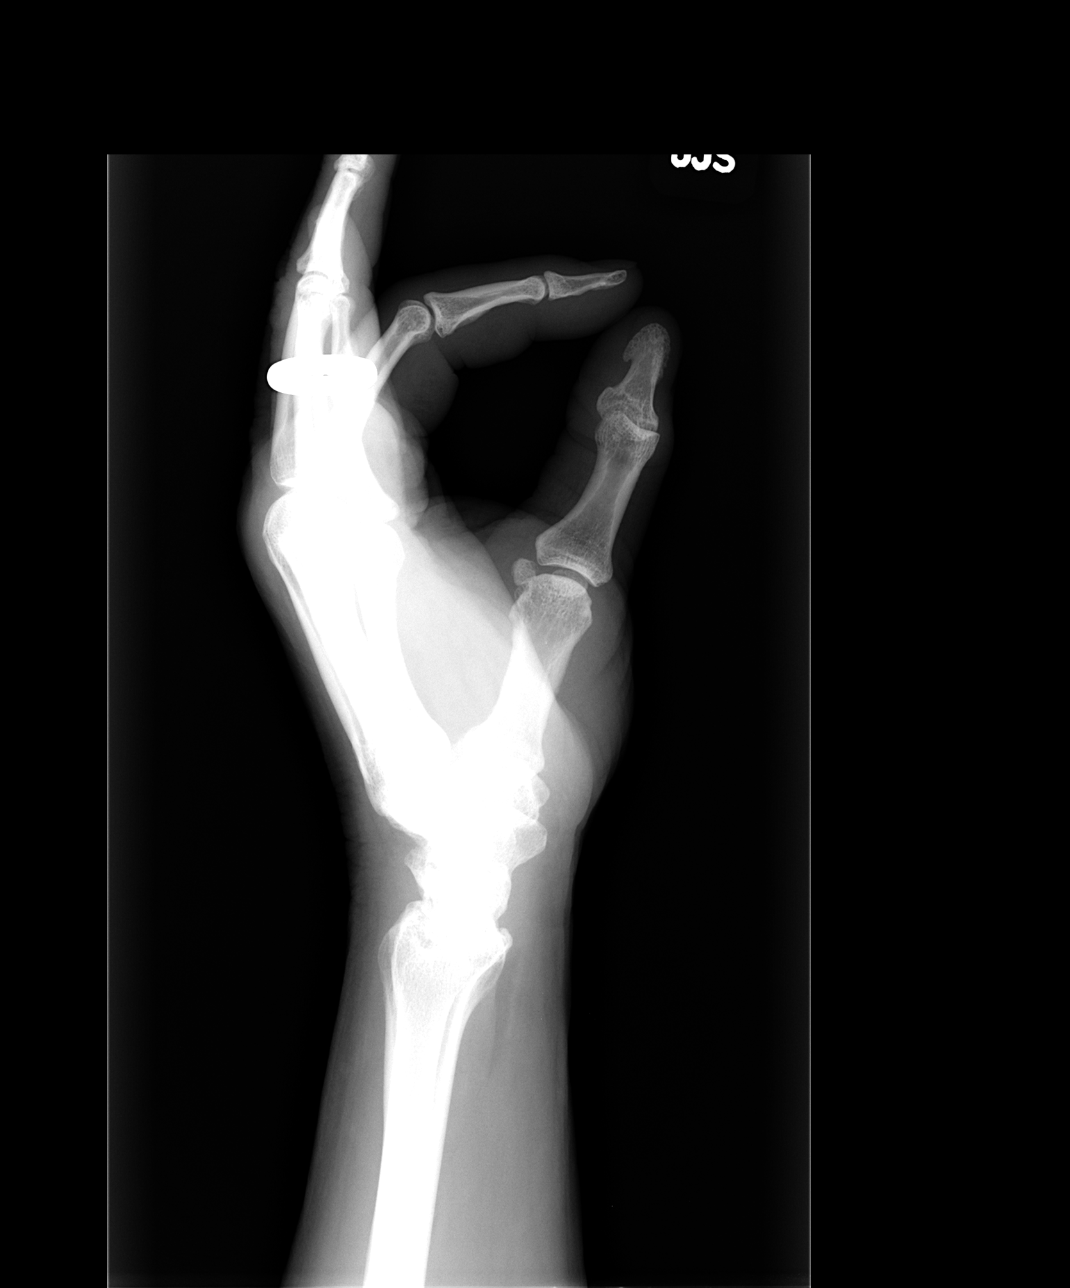

[3 of 3 positions shown; findings below may reference images not displayed]

FINDINGS: The patient was not able to remove is wedding band.
There is soft tissue swelling of the index finger.  No foreign body
or soft tissue emphysema is demonstrated.  There is no evidence of
acute fracture, dislocation or bone destruction.
IMPRESSION: No evidence of foreign body or osseous abnormality.  There is
nonspecific soft tissue swelling in the index finger.

## 2012-11-02 ENCOUNTER — Other Ambulatory Visit: Payer: Self-pay | Admitting: Internal Medicine

## 2012-12-01 ENCOUNTER — Ambulatory Visit: Payer: BC Managed Care – PPO | Admitting: Internal Medicine

## 2012-12-01 ENCOUNTER — Telehealth: Payer: Self-pay | Admitting: Internal Medicine

## 2012-12-01 NOTE — Telephone Encounter (Signed)
Pt missed his appt today. Pt scheduled a shingle vac and flu vac. Pt would like to know if he could come in for labs only and Dr Lovell Sheehan let him know if there's an issue; pt scheduled fup in jan. Pls advise.

## 2012-12-03 NOTE — Telephone Encounter (Signed)
You can uses code 272.4

## 2012-12-03 NOTE — Telephone Encounter (Signed)
Ok lipid AND LIVER

## 2012-12-06 ENCOUNTER — Ambulatory Visit (INDEPENDENT_AMBULATORY_CARE_PROVIDER_SITE_OTHER): Payer: BC Managed Care – PPO | Admitting: Internal Medicine

## 2012-12-06 ENCOUNTER — Other Ambulatory Visit (INDEPENDENT_AMBULATORY_CARE_PROVIDER_SITE_OTHER): Payer: BC Managed Care – PPO

## 2012-12-06 DIAGNOSIS — E1059 Type 1 diabetes mellitus with other circulatory complications: Secondary | ICD-10-CM

## 2012-12-06 DIAGNOSIS — Z2911 Encounter for prophylactic immunotherapy for respiratory syncytial virus (RSV): Secondary | ICD-10-CM

## 2012-12-06 DIAGNOSIS — Z23 Encounter for immunization: Secondary | ICD-10-CM

## 2012-12-06 DIAGNOSIS — E785 Hyperlipidemia, unspecified: Secondary | ICD-10-CM

## 2012-12-06 LAB — HEPATIC FUNCTION PANEL
ALT: 28 U/L (ref 0–53)
AST: 20 U/L (ref 0–37)
Albumin: 3.9 g/dL (ref 3.5–5.2)

## 2012-12-06 LAB — LIPID PANEL
HDL: 51.5 mg/dL (ref >39.00)
Triglycerides: 109 mg/dL (ref 0.0–149.0)
VLDL: 21.8 mg/dL (ref 0.0–40.0)

## 2012-12-06 LAB — BASIC METABOLIC PANEL
CO2: 29 mEq/L (ref 19–32)
Calcium: 8.9 mg/dL (ref 8.4–10.5)
GFR: 100.99 mL/min (ref >60.00)
Glucose, Bld: 119 mg/dL — ABNORMAL HIGH (ref 70–99)
Potassium: 4.1 mEq/L (ref 3.5–5.1)
Sodium: 138 mEq/L (ref 135–145)

## 2012-12-06 LAB — HEMOGLOBIN A1C: Hgb A1c MFr Bld: 6.7 % — ABNORMAL HIGH (ref 4.6–6.5)

## 2013-01-03 ENCOUNTER — Other Ambulatory Visit: Payer: Self-pay | Admitting: Internal Medicine

## 2013-01-11 ENCOUNTER — Other Ambulatory Visit: Payer: Self-pay | Admitting: Internal Medicine

## 2013-03-11 ENCOUNTER — Other Ambulatory Visit: Payer: Self-pay | Admitting: Internal Medicine

## 2013-03-22 ENCOUNTER — Other Ambulatory Visit: Payer: Self-pay | Admitting: Internal Medicine

## 2013-04-01 ENCOUNTER — Encounter: Payer: Self-pay | Admitting: *Deleted

## 2013-04-03 ENCOUNTER — Other Ambulatory Visit: Payer: Self-pay | Admitting: Internal Medicine

## 2013-04-04 ENCOUNTER — Ambulatory Visit (INDEPENDENT_AMBULATORY_CARE_PROVIDER_SITE_OTHER): Payer: BC Managed Care – PPO | Admitting: Internal Medicine

## 2013-04-04 ENCOUNTER — Encounter: Payer: Self-pay | Admitting: Internal Medicine

## 2013-04-04 VITALS — BP 136/78 | HR 68 | Temp 98.0°F | Resp 18 | Ht 77.0 in | Wt 294.0 lb

## 2013-04-04 DIAGNOSIS — E1165 Type 2 diabetes mellitus with hyperglycemia: Secondary | ICD-10-CM

## 2013-04-04 DIAGNOSIS — IMO0001 Reserved for inherently not codable concepts without codable children: Secondary | ICD-10-CM

## 2013-04-04 DIAGNOSIS — E785 Hyperlipidemia, unspecified: Secondary | ICD-10-CM

## 2013-04-04 LAB — HEMOGLOBIN A1C: Hgb A1c MFr Bld: 6.7 % — ABNORMAL HIGH (ref 4.6–6.5)

## 2013-04-04 LAB — LDL CHOLESTEROL, DIRECT: Direct LDL: 188.3 mg/dL

## 2013-04-04 LAB — LIPID PANEL
Cholesterol: 239 mg/dL — ABNORMAL HIGH (ref 0–200)
HDL: 46.6 mg/dL (ref >39.00)
Total CHOL/HDL Ratio: 5
Triglycerides: 85 mg/dL (ref 0.0–149.0)
VLDL: 17 mg/dL (ref 0.0–40.0)

## 2013-04-04 NOTE — Progress Notes (Signed)
Subjective:    Patient ID: Alejandro Koch, male    DOB: [birth year], 63 y.o.   MRN: [id number]  HPI HTN stable weight increased has regained the 15 lbs during the holidays Increased stress and "eating" for stress AODM  Last a1c  Was 6.7 CBG's averaging 150  Review of Systems  Constitutional: Negative for fever and fatigue.  HENT: Negative for congestion, hearing loss and postnasal drip.   Eyes: Negative for discharge, redness and visual disturbance.  Respiratory: Negative for cough, shortness of breath and wheezing.   Cardiovascular: Negative for leg swelling.  Gastrointestinal: Negative for abdominal pain, constipation and abdominal distention.  Genitourinary: Negative for urgency and frequency.  Musculoskeletal: Negative for arthralgias, joint swelling and neck pain.  Skin: Negative for color change and rash.  Neurological: Negative for weakness and light-headedness.  Hematological: Negative for adenopathy.  Psychiatric/Behavioral: Negative for behavioral problems.    The patient is instructed to continue all medications as prescribed. Schedule followup with check out clerk upon leaving the clinic Past Medical History  Diagnosis Date  . Allergy   . History of prostate cancer   . Depression   . GERD (gastroesophageal reflux disease)   . Hyperlipidemia   . Hypertension   . Glaucoma   . Diabetes   . Kidney stones     History   Social History  . Marital Status: Married    Spouse Name: N/A    Number of Children: N/A  . Years of Education: N/A   Occupational History  . Not on file.   Social History Main Topics  . Smoking status: Former Smoker    Quit date: 03/18/1983  . Smokeless tobacco: Never Used  . Alcohol Use: No  . Drug Use: No  . Sexual Activity: Yes   Other Topics Concern  . Not on file   Social History Narrative  . No narrative on file    Past Surgical History  Procedure Laterality Date  . Colonoscopy  2008  . Finger surgery  2013   infection 2nd finger left hand    Family History  Problem Relation Age of Onset  . Leukemia    . Alzheimer's disease Mother   . Heart disease Father   . COPD Father   . Colon cancer Father 42  . Stomach cancer Neg Hx     Allergies  Allergen Reactions  . Iohexol      Code: HIVES, Desc: PT IS ALLERGIC TO IVP DYE AS NOTED IN PREVIOUS RECORDS IN PACS 02/2009/RM, Onset Date: [id number]     Current Outpatient Prescriptions on File Prior to Visit  Medication Sig Dispense Refill  . ALPRAZolam (XANAX) 0.5 MG tablet TAKE 1 TABLET BY MOUTH 3 TIMES A DAY AS NEEDED  30 tablet  4  . BENICAR HCT 20-12.5 MG per tablet TAKE 1 TABLET BY MOUTH DAILY.  90 tablet  1  . BYDUREON 2 MG SUSR INJECT 2 MG INTO THE SKIN ONCE A WEEK.  4 each  11  . cyclobenzaprine (FLEXERIL) 10 MG tablet Take 1 tablet (10 mg total) by mouth 3 (three) times daily as needed for muscle spasms.  30 tablet  0  . Hydrocodone-Acetaminophen 7.5-300 MG TABS Take 1 tablet by mouth 4 (four) times daily as needed.  60 each  0  . hydrocortisone-pramoxine (ANALPRAM-HC) 2.5-1 % rectal cream Place 1 application rectally 3 (three) times daily as needed.  30 g  3  . ketorolac (ACULAR) 0.5 % ophthalmic solution USE AS DIRECTED  5 mL  1  . NEXIUM 40 MG capsule TAKE 1 CAPSULE BY MOUTH 2 TIMES A DAY  60 capsule  prn  . tamsulosin (FLOMAX) 0.4 MG CAPS Take 1 capsule (0.4 mg total) by mouth daily after supper.  90 capsule  3  . temazepam (RESTORIL) 30 MG capsule TAKE 1 CAPSULE BY MOUTH AT BEDTIME AS NEEDED FOR SLEEP  30 capsule  3  . zolpidem (AMBIEN) 10 MG tablet TAKE 1 TABLET BY MOUTH AT BEDTIME  30 tablet  3   No current facility-administered medications on file prior to visit.    BP 136/78  Pulse 68  Temp(Src) 98 F (36.7 C)  Resp 18  Ht 6\' 5"  (1.956 m)  Wt 294 lb (133.358 kg)  BMI 34.86 kg/m2       Objective:   Physical Exam  Constitutional: He appears well-developed and well-nourished.  HENT:  Head: Normocephalic and atraumatic.   Eyes: Conjunctivae are normal. Pupils are equal, round, and reactive to light.  Neck: Normal range of motion. Neck supple.  Cardiovascular: Normal rate and regular rhythm.   Murmur heard. Pulmonary/Chest: Effort normal and breath sounds normal.  Abdominal: Soft. Bowel sounds are normal.          Assessment & Plan:  A1c for DM monitoring and dist reveiwed Weight loss goals started  HTN stable Fatty liver Monitor lipids

## 2013-04-04 NOTE — Progress Notes (Signed)
Pre visit review using our clinic review tool, if applicable. No additional management support is needed unless otherwise documented below in the visit note. 

## 2013-04-05 ENCOUNTER — Telehealth: Payer: Self-pay

## 2013-04-05 NOTE — Telephone Encounter (Signed)
Relevant patient education assigned to patient using Emmi. ° °

## 2013-04-13 ENCOUNTER — Other Ambulatory Visit: Payer: Self-pay | Admitting: Internal Medicine

## 2013-04-18 ENCOUNTER — Other Ambulatory Visit: Payer: Self-pay | Admitting: Internal Medicine

## 2013-05-02 ENCOUNTER — Encounter: Payer: Self-pay | Admitting: Internal Medicine

## 2013-06-27 ENCOUNTER — Other Ambulatory Visit: Payer: Self-pay | Admitting: Internal Medicine

## 2013-07-09 ENCOUNTER — Ambulatory Visit (INDEPENDENT_AMBULATORY_CARE_PROVIDER_SITE_OTHER): Payer: BC Managed Care – PPO | Admitting: Family Medicine

## 2013-07-09 ENCOUNTER — Other Ambulatory Visit: Payer: Self-pay | Admitting: Internal Medicine

## 2013-07-09 VITALS — BP 128/58 | HR 101 | Temp 98.4°F | Resp 16 | Ht 77.0 in | Wt 281.2 lb

## 2013-07-09 DIAGNOSIS — R1011 Right upper quadrant pain: Secondary | ICD-10-CM

## 2013-07-09 DIAGNOSIS — R197 Diarrhea, unspecified: Secondary | ICD-10-CM

## 2013-07-09 DIAGNOSIS — K5730 Diverticulosis of large intestine without perforation or abscess without bleeding: Secondary | ICD-10-CM

## 2013-07-09 LAB — POCT CBC
GRANULOCYTE PERCENT: 49.8 % (ref 37–80)
HEMATOCRIT: 44.7 % (ref 43.5–53.7)
Hemoglobin: 14.3 g/dL (ref 14.1–18.1)
LYMPH, POC: 2.1 (ref 0.6–3.4)
MCH, POC: 28.9 pg (ref 27–31.2)
MCHC: 32 g/dL (ref 31.8–35.4)
MCV: 90.5 fL (ref 80–97)
MID (CBC): 0.5 (ref 0–0.9)
MPV: 7.3 fL (ref 0–99.8)
PLATELET COUNT, POC: 292 10*3/uL (ref 142–424)
POC GRANULOCYTE: 2.5 (ref 2–6.9)
POC LYMPH PERCENT: 40.4 %L (ref 10–50)
POC MID %: 9.8 %M (ref 0–12)
RBC: 4.94 M/uL (ref 4.69–6.13)
RDW, POC: 13.2 %
WBC: 5.1 10*3/uL (ref 4.6–10.2)

## 2013-07-09 LAB — IFOBT (OCCULT BLOOD): IMMUNOLOGICAL FECAL OCCULT BLOOD TEST: NEGATIVE

## 2013-07-09 NOTE — Progress Notes (Signed)
Subjective: 63 year old man who's been having diarrhea all week. He found some old Lomotil he had been taking that. When he stopped taking that his bowel start losing again. He took his last one this morning after loose BM. He went through a phase of having dark stool, then it got green looking. He has not been running a fever. He has been low but nauseous. No vomiting. No urinary symptoms. He is on a number of medications. He does have diabetes. No recent medicine changes.  Objective: Large man who doesn't feel good but no major distress. Chest is clear. Heart regular without murmurs. Abdomen has bowel sounds normally. Is soft without organomegaly or masses or significant tenderness. No CVA tenderness. Digital rectal exam was done and was normal. Stool will be checked for occult blood. Colonoscopy report from last year was reviewed. He had a lot of diverticulosis but no other lesions.  Assessment:  Diarrhea, prolonged all week Nausea Change in stool color  Plan: CBC Stool for occult blood  Results for orders placed in visit on 07/09/13  POCT CBC      Result Value Ref Range   WBC 5.1  4.6 - 10.2 K/uL   Lymph, poc 2.1  0.6 - 3.4   POC LYMPH PERCENT 40.4  10 - 50 %L   MID (cbc) 0.5  0 - 0.9   POC MID % 9.8  0 - 12 %M   POC Granulocyte 2.5  2 - 6.9   Granulocyte percent 49.8  37 - 80 %G   RBC 4.94  4.69 - 6.13 M/uL   Hemoglobin 14.3  14.1 - 18.1 g/dL   HCT, POC 44.7  43.5 - 53.7 %   MCV 90.5  80 - 97 fL   MCH, POC 28.9  27 - 31.2 pg   MCHC 32.0  31.8 - 35.4 g/dL   RDW, POC 13.2     Platelet Count, POC 292  142 - 424 K/uL   MPV 7.3  0 - 99.8 fL  IFOBT (OCCULT BLOOD)      Result Value Ref Range   IFOBT Negative     This most he just a prolonged gastroenteritis. If he continues with symptoms we will need to reassess. In the meanwhile her lichen taking over-the-counter Imodium on an as-needed basis, maximum for 4 times a day.

## 2013-07-09 NOTE — Patient Instructions (Addendum)
The laboratory studies looked good. If you develop more pain in the right upper abdomen where the dog landed on the we will want to order a CT scan of the abdomen. You would need to come in for recheck if it is getting worse. Go to the emergency room in the event of severe acute pain.  Your colonoscopy previously had shown a lot of diverticulosis, which a little pouches in the wall of the intestine. With a normal white blood count I do not think those are infected.  Take Imodium threats is non-prescription) one pill maximum of 4 times daily for the diarrhea. If the diarrhea continues to persist over the next 4-5 days further evaluation would be needed.

## 2013-07-10 LAB — COMPREHENSIVE METABOLIC PANEL
ALT: 19 U/L (ref 0–53)
AST: 15 U/L (ref 0–37)
Albumin: 3.9 g/dL (ref 3.5–5.2)
Alkaline Phosphatase: 76 U/L (ref 39–117)
BUN: 12 mg/dL (ref 6–23)
CO2: 28 meq/L (ref 19–32)
CREATININE: 0.92 mg/dL (ref 0.50–1.35)
Calcium: 8.9 mg/dL (ref 8.4–10.5)
Chloride: 104 mEq/L (ref 96–112)
GLUCOSE: 118 mg/dL — AB (ref 70–99)
Potassium: 4.2 mEq/L (ref 3.5–5.3)
SODIUM: 143 meq/L (ref 135–145)
TOTAL PROTEIN: 6.6 g/dL (ref 6.0–8.3)
Total Bilirubin: 0.6 mg/dL (ref 0.2–1.2)

## 2013-07-12 ENCOUNTER — Encounter: Payer: Self-pay | Admitting: *Deleted

## 2013-08-03 ENCOUNTER — Telehealth: Payer: Self-pay | Admitting: Internal Medicine

## 2013-08-03 NOTE — Telephone Encounter (Addendum)
Pt is a long time time pt of dr Arnoldo Morale. They live across the street from your office and his wife has has transferred to dr Diona Browner. Would you please accept as a pt?

## 2013-08-04 NOTE — Telephone Encounter (Signed)
I left a message on patient's voice mail to call back and schedule appointment. 

## 2013-08-04 NOTE — Telephone Encounter (Signed)
Okay Should have an appt within the next 2 months or so

## 2013-08-22 ENCOUNTER — Other Ambulatory Visit: Payer: Self-pay | Admitting: Internal Medicine

## 2013-09-20 ENCOUNTER — Other Ambulatory Visit: Payer: Self-pay | Admitting: Internal Medicine

## 2013-10-05 ENCOUNTER — Telehealth: Payer: Self-pay | Admitting: *Deleted

## 2013-10-05 ENCOUNTER — Ambulatory Visit (INDEPENDENT_AMBULATORY_CARE_PROVIDER_SITE_OTHER): Payer: BC Managed Care – PPO | Admitting: Internal Medicine

## 2013-10-05 ENCOUNTER — Ambulatory Visit: Payer: BC Managed Care – PPO | Admitting: Internal Medicine

## 2013-10-05 ENCOUNTER — Encounter: Payer: Self-pay | Admitting: Internal Medicine

## 2013-10-05 VITALS — BP 138/80 | HR 78 | Temp 98.2°F | Ht 75.5 in | Wt 285.0 lb

## 2013-10-05 DIAGNOSIS — E785 Hyperlipidemia, unspecified: Secondary | ICD-10-CM

## 2013-10-05 DIAGNOSIS — E1149 Type 2 diabetes mellitus with other diabetic neurological complication: Secondary | ICD-10-CM

## 2013-10-05 DIAGNOSIS — N138 Other obstructive and reflux uropathy: Secondary | ICD-10-CM | POA: Insufficient documentation

## 2013-10-05 DIAGNOSIS — K7689 Other specified diseases of liver: Secondary | ICD-10-CM

## 2013-10-05 DIAGNOSIS — I10 Essential (primary) hypertension: Secondary | ICD-10-CM

## 2013-10-05 DIAGNOSIS — N401 Enlarged prostate with lower urinary tract symptoms: Secondary | ICD-10-CM

## 2013-10-05 DIAGNOSIS — N400 Enlarged prostate without lower urinary tract symptoms: Secondary | ICD-10-CM

## 2013-10-05 DIAGNOSIS — Z23 Encounter for immunization: Secondary | ICD-10-CM

## 2013-10-05 DIAGNOSIS — G4700 Insomnia, unspecified: Secondary | ICD-10-CM

## 2013-10-05 LAB — COMPREHENSIVE METABOLIC PANEL
ALT: 28 U/L (ref 0–53)
AST: 21 U/L (ref 0–37)
Albumin: 4.1 g/dL (ref 3.5–5.2)
Alkaline Phosphatase: 79 U/L (ref 39–117)
BUN: 9 mg/dL (ref 6–23)
CALCIUM: 9.3 mg/dL (ref 8.4–10.5)
CO2: 30 mEq/L (ref 19–32)
Chloride: 103 mEq/L (ref 96–112)
Creatinine, Ser: 0.9 mg/dL (ref 0.4–1.5)
GFR: 91.64 mL/min (ref >60.00)
GLUCOSE: 115 mg/dL — AB (ref 70–99)
Potassium: 5.1 mEq/L (ref 3.5–5.1)
Sodium: 140 mEq/L (ref 135–145)
TOTAL PROTEIN: 7.4 g/dL (ref 6.0–8.3)
Total Bilirubin: 0.8 mg/dL (ref 0.2–1.2)

## 2013-10-05 LAB — LIPID PANEL
CHOLESTEROL: 249 mg/dL — AB (ref 0–200)
HDL: 48.6 mg/dL (ref >39.00)
LDL Cholesterol: 181 mg/dL — ABNORMAL HIGH (ref 0–99)
NonHDL: 200.4
TRIGLYCERIDES: 99 mg/dL (ref 0.0–149.0)
Total CHOL/HDL Ratio: 5
VLDL: 19.8 mg/dL (ref 0.0–40.0)

## 2013-10-05 LAB — CBC WITH DIFFERENTIAL/PLATELET
BASOS PCT: 0.4 % (ref 0.0–3.0)
Basophils Absolute: 0 10*3/uL (ref 0.0–0.1)
Eosinophils Absolute: 0.1 10*3/uL (ref 0.0–0.7)
Eosinophils Relative: 1.2 % (ref 0.0–5.0)
HCT: 41.7 % (ref 39.0–52.0)
Hemoglobin: 13.9 g/dL (ref 13.0–17.0)
Lymphocytes Relative: 40.1 % (ref 12.0–46.0)
Lymphs Abs: 2.3 10*3/uL (ref 0.7–4.0)
MCHC: 33.4 g/dL (ref 30.0–36.0)
MCV: 87.9 fl (ref 78.0–100.0)
MONO ABS: 0.3 10*3/uL (ref 0.1–1.0)
MONOS PCT: 5.5 % (ref 3.0–12.0)
NEUTROS PCT: 52.8 % (ref 43.0–77.0)
Neutro Abs: 3.1 10*3/uL (ref 1.4–7.7)
Platelets: 228 10*3/uL (ref 150.0–400.0)
RBC: 4.74 Mil/uL (ref 4.22–5.81)
RDW: 13.9 % (ref 11.5–15.5)
WBC: 5.8 10*3/uL (ref 4.0–10.5)

## 2013-10-05 LAB — HM DIABETES FOOT EXAM

## 2013-10-05 LAB — MICROALBUMIN / CREATININE URINE RATIO
Creatinine,U: 69.8 mg/dL
MICROALB/CREAT RATIO: 0.6 mg/g (ref 0.0–30.0)
Microalb, Ur: 0.4 mg/dL (ref 0.0–1.9)

## 2013-10-05 LAB — T4, FREE: FREE T4: 0.89 ng/dL (ref 0.60–1.60)

## 2013-10-05 LAB — HEMOGLOBIN A1C: HEMOGLOBIN A1C: 6.7 % — AB (ref 4.6–6.5)

## 2013-10-05 MED ORDER — ATORVASTATIN CALCIUM 20 MG PO TABS: 20.0000 mg | ORAL_TABLET | Freq: Every day | ORAL | Status: DC

## 2013-10-05 MED ORDER — TADALAFIL 5 MG PO TABS: 5.0000 mg | ORAL_TABLET | Freq: Every day | ORAL | Status: DC

## 2013-10-05 NOTE — Patient Instructions (Signed)
Please set up your eye exam.  Exercise to Lose Weight Exercise and a healthy diet may help you lose weight. Your doctor may suggest specific exercises. EXERCISE IDEAS AND TIPS  Choose low-cost things you enjoy doing, such as walking, bicycling, or exercising to workout videos.  Take stairs instead of the elevator.  Walk during your lunch break.  Park your car further away from work or school.  Go to a gym or an exercise class.  Start with 5 to 10 minutes of exercise each day. Build up to 30 minutes of exercise 4 to 6 days a week.  Wear shoes with good support and comfortable clothes.  Stretch before and after working out.  Work out until you breathe harder and your heart beats faster.  Drink extra water when you exercise.  Do not do so much that you hurt yourself, feel dizzy, or get very short of breath. Exercises that burn about 150 calories:  Running 1  miles in 15 minutes.  Playing volleyball for 45 to 60 minutes.  Washing and waxing a car for 45 to 60 minutes.  Playing touch football for 45 minutes.  Walking 1  miles in 35 minutes.  Pushing a stroller 1  miles in 30 minutes.  Playing basketball for 30 minutes.  Raking leaves for 30 minutes.  Bicycling 5 miles in 30 minutes.  Walking 2 miles in 30 minutes.  Dancing for 30 minutes.  Shoveling snow for 15 minutes.  Swimming laps for 20 minutes.  Walking up stairs for 15 minutes.  Bicycling 4 miles in 15 minutes.  Gardening for 30 to 45 minutes.  Jumping rope for 15 minutes.  Washing windows or floors for 45 to 60 minutes. Document Released: 04/05/2010 Document Revised: 05/26/2011 Document Reviewed: 04/05/2010 [facility] Patient Information 2015 [place], [place]. This information is not intended to replace advice given to you by your health care provider. Make sure you discuss any questions you have with your health care provider. DASH Eating Plan DASH stands for "Dietary Approaches to Stop  Hypertension." The DASH eating plan is a healthy eating plan that has been shown to reduce high blood pressure (hypertension). Additional health benefits may include reducing the risk of type 2 diabetes mellitus, heart disease, and stroke. The DASH eating plan may also help with weight loss. WHAT DO I NEED TO KNOW ABOUT THE DASH EATING PLAN? For the DASH eating plan, you will follow these general guidelines:  Choose foods with a percent daily value for sodium of less than 5% (as listed on the food label).  Use salt-free seasonings or herbs instead of table salt or sea salt.  Check with your health care provider or pharmacist before using salt substitutes.  Eat lower-sodium products, often labeled as "lower sodium" or "no salt added."  Eat fresh foods.  Eat more vegetables, fruits, and low-fat dairy products.  Choose whole grains. Look for the word "whole" as the first word in the ingredient list.  Choose fish and skinless chicken or [place] more often than red meat. Limit fish, poultry, and meat to 6 oz (170 g) each day.  Limit sweets, desserts, sugars, and sugary drinks.  Choose heart-healthy fats.  Limit cheese to 1 oz (28 g) per day.  Eat more home-cooked food and less restaurant, buffet, and fast food.  Limit fried foods.  Cook foods using methods other than frying.  Limit canned vegetables. If you do use them, rinse them well to decrease the sodium.  When eating at a restaurant,  ask that your food be prepared with less salt, or no salt if possible. WHAT FOODS CAN I EAT? Seek help from a dietitian for individual calorie needs. Grains Whole grain or whole wheat bread. Brown rice. Whole grain or whole wheat pasta. Quinoa, bulgur, and whole grain cereals. Low-sodium cereals. Corn or whole wheat flour tortillas. Whole grain cornbread. Whole grain crackers. Low-sodium crackers. Vegetables Fresh or frozen vegetables (raw, steamed, roasted, or grilled). Low-sodium or  reduced-sodium tomato and vegetable juices. Low-sodium or reduced-sodium tomato sauce and paste. Low-sodium or reduced-sodium canned vegetables.  Fruits All fresh, canned (in natural juice), or frozen fruits. Meat and Other Protein Products Ground beef (85% or leaner), grass-fed beef, or beef trimmed of fat. Skinless chicken or [place]. Ground chicken or [place]. Pork trimmed of fat. All fish and seafood. Eggs. Dried beans, peas, or lentils. Unsalted nuts and seeds. Unsalted canned beans. Dairy Low-fat dairy products, such as skim or 1% milk, 2% or reduced-fat cheeses, low-fat ricotta or cottage cheese, or plain low-fat yogurt. Low-sodium or reduced-sodium cheeses. Fats and Oils Tub margarines without trans fats. Light or reduced-fat mayonnaise and salad dressings (reduced sodium). Avocado. Safflower, olive, or canola oils. Natural peanut or almond butter. Other Unsalted popcorn and pretzels. The items listed above may not be a complete list of recommended foods or beverages. Contact your dietitian for more options. WHAT FOODS ARE NOT RECOMMENDED? Grains White bread. White pasta. White rice. Refined cornbread. Bagels and croissants. Crackers that contain trans fat. Vegetables Creamed or fried vegetables. Vegetables in a cheese sauce. Regular canned vegetables. Regular canned tomato sauce and paste. Regular tomato and vegetable juices. Fruits Dried fruits. Canned fruit in light or heavy syrup. Fruit juice. Meat and Other Protein Products Fatty cuts of meat. Ribs, chicken wings, bacon, sausage, bologna, salami, chitterlings, fatback, hot dogs, bratwurst, and packaged luncheon meats. Salted nuts and seeds. Canned beans with salt. Dairy Whole or 2% milk, cream, half-and-half, and cream cheese. Whole-fat or sweetened yogurt. Full-fat cheeses or blue cheese. Nondairy creamers and whipped toppings. Processed cheese, cheese spreads, or cheese curds. Condiments Onion and garlic salt, seasoned salt,  table salt, and sea salt. Canned and packaged gravies. Worcestershire sauce. Tartar sauce. Barbecue sauce. Teriyaki sauce. Soy sauce, including reduced sodium. Steak sauce. Fish sauce. Oyster sauce. Cocktail sauce. Horseradish. Ketchup and mustard. Meat flavorings and tenderizers. Bouillon cubes. Hot sauce. Tabasco sauce. Marinades. Taco seasonings. Relishes. Fats and Oils Butter, stick margarine, lard, shortening, ghee, and bacon fat. Coconut, palm kernel, or palm oils. Regular salad dressings. Other Pickles and olives. Salted popcorn and pretzels. The items listed above may not be a complete list of foods and beverages to avoid. Contact your dietitian for more information. WHERE CAN I FIND MORE INFORMATION? National Heart, Lung, and Blood Institute: travelstabloid.com Document Released: 02/20/2011 Document Revised: 03/08/2013 Document Reviewed: 01/05/2013 [facility] Patient Information 2015 [place], [place]. This information is not intended to replace advice given to you by your health care provider. Make sure you discuss any questions you have with your health care provider.

## 2013-10-05 NOTE — Assessment & Plan Note (Signed)
Hopefully still has good control

## 2013-10-05 NOTE — Addendum Note (Signed)
Addended by: Despina Hidden on: 10/05/2013 02:17 PM   Modules accepted: Orders

## 2013-10-05 NOTE — Telephone Encounter (Signed)
See prior auth form on your desk for Cialis 5mg 

## 2013-10-05 NOTE — Progress Notes (Signed)
Subjective:    Patient ID: Alejandro Koch, male    DOB: 1951-02-12, 63 y.o.   MRN: [id number]  HPI Transferring care---Dr Arnoldo Morale giving up clinical practice  Stopped the statin and BP meds He feels his right side pain was worse with both of them Feels better if he lies on that side No exercise Weight has stablized--did lose 25# a few years ago  Sugars in Am 135-150 No hypoglycemic reactions  Has been on testosterone for many years Has helped his ED Discussed concerns about liver issues with this Sees urologist for BPH Recent PSA was okay  Ongoing tinnitus-- hard to sleep Stress at work--can't wind down also Troubled with the nocturia Uses zolpidem and temazepam every other night-- uses the alprazolam every night also More frustration at [employer] behind. This causes some depression. Works 7 days a week usually---even if at his place at the [place] Prescriptions on File Prior to Visit  Medication Sig Dispense Refill  . ALPRAZolam (XANAX) 0.5 MG tablet TAKE 1 TABLET BY MOUTH 3 TIMES A DAY  30 tablet  4  . BYDUREON 2 MG SUSR INJECT 2 MG INTO THE SKIN ONCE A WEEK.  4 each  11  . cyclobenzaprine (FLEXERIL) 10 MG tablet TAKE 1 TABLET (10 MG TOTAL) BY MOUTH 3 (THREE) TIMES DAILY AS NEEDED FOR MUSCLE SPASMS.  30 tablet  0  . hydrocortisone-pramoxine (ANALPRAM-HC) 2.5-1 % rectal cream Place 1 application rectally 3 (three) times daily as needed.  30 g  3  . ketorolac (ACULAR) 0.5 % ophthalmic solution USE AS DIRECTED  5 mL  1  . NEXIUM 40 MG capsule TAKE 1 CAPSULE BY MOUTH 2 TIMES A DAY  60 capsule  prn  . tamsulosin (FLOMAX) 0.4 MG CAPS capsule TAKE 1 CAPSULE (0.4 MG TOTAL) BY MOUTH DAILY AFTER SUPPER.  90 capsule  3  . temazepam (RESTORIL) 30 MG capsule TAKE ONE CAPSULE BY MOUTH EVERY DAY AS NEEDED SLEEP  30 capsule  3  . Testosterone 20.25 MG/1.25GM (1.62%) GEL Place 1 application onto the skin daily. 1 tube qod and 2 tubes qod      . zolpidem (AMBIEN) 10  MG tablet TAKE 1 TABLET BY MOUTH AT BEDTIME  30 tablet  3   No current facility-administered medications on file prior to visit.    Allergies  Allergen Reactions  . Iohexol      Code: HIVES, Desc: PT IS ALLERGIC TO IVP DYE AS NOTED IN PREVIOUS RECORDS IN PACS 02/2009/RM, Onset Date: [id number]     Past Medical History  Diagnosis Date  . Allergy   . Depression   . GERD (gastroesophageal reflux disease)   . Hyperlipidemia   . Hypertension   . Glaucoma   . Diabetes   . Kidney stones   . BPH (benign prostatic hypertrophy)     Past Surgical History  Procedure Laterality Date  . Colonoscopy  2008  . Finger surgery  2013    infection 2nd finger left hand    Family History  Problem Relation Age of Onset  . Leukemia    . Alzheimer's disease Mother   . Heart disease Father   . COPD Father   . Colon cancer Father 64  . Stomach cancer Neg Hx     History   Social History  . Marital Status: Married    Spouse Name: N/A    Number of Children: 1  . Years of Education: N/A   Occupational History  .  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 Smoking status: Former Smoker    Quit date: 03/18/1983  . Smokeless tobacco: Never Used  . Alcohol Use: No  . Drug Use: No  . Sexual Activity: Yes   Other Topics Concern  . Not on file   Social History Narrative  . No narrative on file   Review of Systems Bowels okay Skin sensitivity around cheeks of face--does use sun protection Some numbness of toes and balls of feet. No ulcers    Objective:   Physical Exam  Constitutional: He appears well-developed and well-nourished. No distress.  Neck: Normal range of motion. Neck supple. No thyromegaly present.  Cardiovascular: Normal rate, regular rhythm, normal heart sounds and intact distal pulses.  Exam reveals no gallop.   No murmur heard. Pulmonary/Chest: Effort normal and breath sounds normal. No respiratory distress. He has no wheezes. He  has no rales.  Abdominal: Soft. He exhibits no distension. There is no tenderness. There is no rebound and no guarding.  Liver percussed a little below RCM  Musculoskeletal: He exhibits no edema and no tenderness.  Lymphadenopathy:    He has no cervical adenopathy.  Neurological:  Mildly decreased sensation in plantar feet  Skin: No rash noted. No erythema.  No foot lesions  Psychiatric:  Stress, voices dissatisfaction about work          Assessment & Plan:

## 2013-10-05 NOTE — Assessment & Plan Note (Signed)
BP Readings from Last 3 Encounters:  10/05/13 138/80  07/09/13 128/58  04/04/13 136/78   Good control though he stopped his med Will check urine microal--- ACEI again if positive

## 2013-10-05 NOTE — Assessment & Plan Note (Addendum)
Still symptomatic despite the flomax Will try cialis to see if he can do okay off the testosterone also

## 2013-10-05 NOTE — Progress Notes (Signed)
Pre visit review using our clinic review tool, if applicable. No additional management support is needed unless otherwise documented below in the visit note. 

## 2013-10-05 NOTE — Assessment & Plan Note (Addendum)
Needs to lose weight  Discussed concerns about the testosterone--will try off this twinrix

## 2013-10-05 NOTE — Assessment & Plan Note (Signed)
Continue sleep meds for now

## 2013-10-05 NOTE — Assessment & Plan Note (Signed)
Needs to restart statin Discussed that biggest risk with fatty liver is heart disease Restart atorvastatin

## 2013-10-06 NOTE — Telephone Encounter (Signed)
Form done 

## 2013-10-06 NOTE — Telephone Encounter (Signed)
Form faxed back and scanned 

## 2013-10-17 ENCOUNTER — Encounter: Payer: Self-pay | Admitting: Internal Medicine

## 2013-12-06 ENCOUNTER — Ambulatory Visit (INDEPENDENT_AMBULATORY_CARE_PROVIDER_SITE_OTHER): Payer: BC Managed Care – PPO | Admitting: Internal Medicine

## 2013-12-06 ENCOUNTER — Encounter: Payer: Self-pay | Admitting: Internal Medicine

## 2013-12-06 VITALS — BP 140/59 | HR 86 | Temp 98.1°F | Wt 282.0 lb

## 2013-12-06 DIAGNOSIS — Z23 Encounter for immunization: Secondary | ICD-10-CM

## 2013-12-06 DIAGNOSIS — E785 Hyperlipidemia, unspecified: Secondary | ICD-10-CM

## 2013-12-06 DIAGNOSIS — N400 Enlarged prostate without lower urinary tract symptoms: Secondary | ICD-10-CM

## 2013-12-06 DIAGNOSIS — K7689 Other specified diseases of liver: Secondary | ICD-10-CM

## 2013-12-06 LAB — HEPATIC FUNCTION PANEL
ALT: 33 U/L (ref 0–53)
AST: 32 U/L (ref 0–37)
Albumin: 4 g/dL (ref 3.5–5.2)
Alkaline Phosphatase: 88 U/L (ref 39–117)
BILIRUBIN DIRECT: 0.1 mg/dL (ref 0.0–0.3)
Total Bilirubin: 0.6 mg/dL (ref 0.2–1.2)
Total Protein: 7.3 g/dL (ref 6.0–8.3)

## 2013-12-06 LAB — LIPID PANEL
Cholesterol: 139 mg/dL (ref 0–200)
HDL: 37.1 mg/dL — AB (ref >39.00)
LDL CALC: 79 mg/dL (ref 0–99)
NonHDL: 101.9
TRIGLYCERIDES: 114 mg/dL (ref 0.0–149.0)
Total CHOL/HDL Ratio: 4
VLDL: 22.8 mg/dL (ref 0.0–40.0)

## 2013-12-06 NOTE — Assessment & Plan Note (Signed)
Ongoing problems Will go ahead and try the cialis

## 2013-12-06 NOTE — Addendum Note (Signed)
Addended by: Despina Hidden on: 12/06/2013 09:46 AM   Modules accepted: Orders

## 2013-12-06 NOTE — Assessment & Plan Note (Signed)
Back on statin Will check labs

## 2013-12-06 NOTE — Telephone Encounter (Signed)
I gave patient a copy of the approval letter this morning at this [facility] and they are still saying this is not approved for the quantity I requested. Please advise what pt should do?

## 2013-12-06 NOTE — Assessment & Plan Note (Signed)
Still has discomfort in RUQ Discussed that weight loss is the best treatment for this Eats out all the time--- this is limiting his weight loss

## 2013-12-06 NOTE — Progress Notes (Signed)
Subjective:    Patient ID: Alejandro Koch, male    DOB: 03/01/1951, 63 y.o.   MRN: [id number]  HPI Doing okay  Tolerating the statin Does have pain in RUQ that he relates to liver Goes back 2 years --does feel that the pain is some worse Better when he splints it with pressure over the spot  Did stop the testosterone Does note increased frequency of urine Wasn't able to start the cialis---[facility] said it wasn't improved Nocturia up to 2-3 per night again Still on the flomax  Current Outpatient Prescriptions on File Prior to Visit  Medication Sig Dispense Refill  . ALPRAZolam (XANAX) 0.5 MG tablet TAKE 1 TABLET BY MOUTH 3 TIMES A DAY  30 tablet  4  . atorvastatin (LIPITOR) 20 MG tablet Take 1 tablet (20 mg total) by mouth daily.  90 tablet  3  . BYDUREON 2 MG SUSR INJECT 2 MG INTO THE SKIN ONCE A WEEK.  4 each  11  . hydrocortisone-pramoxine (ANALPRAM-HC) 2.5-1 % rectal cream Place 1 application rectally 3 (three) times daily as needed.  30 g  3  . ketorolac (ACULAR) 0.5 % ophthalmic solution USE AS DIRECTED  5 mL  1  . NEXIUM 40 MG capsule TAKE 1 CAPSULE BY MOUTH 2 TIMES A DAY  60 capsule  prn  . tadalafil (CIALIS) 5 MG tablet Take 1 tablet (5 mg total) by mouth daily.  30 tablet  11  . tamsulosin (FLOMAX) 0.4 MG CAPS capsule TAKE 1 CAPSULE (0.4 MG TOTAL) BY MOUTH DAILY AFTER SUPPER.  90 capsule  3  . temazepam (RESTORIL) 30 MG capsule TAKE ONE CAPSULE BY MOUTH EVERY DAY AS NEEDED SLEEP  30 capsule  3  . zolpidem (AMBIEN) 10 MG tablet TAKE 1 TABLET BY MOUTH AT BEDTIME  30 tablet  3   No current facility-administered medications on file prior to visit.    Allergies  Allergen Reactions  . Iohexol      Code: HIVES, Desc: PT IS ALLERGIC TO IVP DYE AS NOTED IN PREVIOUS RECORDS IN PACS 02/2009/RM, Onset Date: [id number]     Past Medical History  Diagnosis Date  . Allergy   . Depression   . GERD (gastroesophageal reflux disease)   . Hyperlipidemia   . Hypertension   . Glaucoma     . Diabetes   . Kidney stones   . BPH (benign prostatic hypertrophy)     Past Surgical History  Procedure Laterality Date  . Colonoscopy  2008  . Finger surgery  2013    infection 2nd finger left hand    Family History  Problem Relation Age of Onset  . Leukemia    . Alzheimer's disease Mother   . Heart disease Father   . COPD Father   . Colon cancer Father 12  . Stomach cancer Neg Hx     History   Social History  . Marital Status: Married    Spouse Name: N/A    Number of Children: 1  . 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 Smoking status: Former Smoker    Quit date: 03/18/1983  . Smokeless tobacco: Never Used  . Alcohol Use: No  . Drug Use: No  . Sexual Activity: Yes   Other Topics Concern  . Not on file   Social History Narrative  . No narrative on file   Review of  Systems Appetite is okay Weight down 3#    Objective:   Physical Exam  Psychiatric: He has a normal mood and affect. His behavior is normal.          Assessment & Plan:

## 2013-12-06 NOTE — Progress Notes (Signed)
Pre visit review using our clinic review tool, if applicable. No additional management support is needed unless otherwise documented below in the visit note. 

## 2013-12-07 NOTE — Telephone Encounter (Signed)
The form you filled out is for BPH and it was approved, I'm not sure what I would be calling about? It can't be an appeal because it was approved, maybe quantity exception?

## 2014-02-20 ENCOUNTER — Ambulatory Visit: Payer: BC Managed Care – PPO | Admitting: Internal Medicine

## 2014-03-06 ENCOUNTER — Ambulatory Visit (INDEPENDENT_AMBULATORY_CARE_PROVIDER_SITE_OTHER): Payer: BC Managed Care – PPO | Admitting: Family Medicine

## 2014-03-06 VITALS — BP 136/92 | HR 110 | Temp 98.3°F | Resp 20 | Ht 75.0 in | Wt 287.0 lb

## 2014-03-06 DIAGNOSIS — J209 Acute bronchitis, unspecified: Secondary | ICD-10-CM

## 2014-03-06 DIAGNOSIS — J0100 Acute maxillary sinusitis, unspecified: Secondary | ICD-10-CM

## 2014-03-06 MED ORDER — AZITHROMYCIN 250 MG PO TABS: ORAL_TABLET | ORAL | Status: DC

## 2014-03-06 MED ORDER — HYDROCODONE-HOMATROPINE 5-1.5 MG/5ML PO SYRP: 5.0000 mL | ORAL_SOLUTION | Freq: Three times a day (TID) | ORAL | Status: DC | PRN

## 2014-03-06 NOTE — Progress Notes (Signed)
Patient ID: JEAN SKOW MRN: [id number], DOB: Sep 20, 1950, 63 y.o. Date of Encounter: 03/06/2014, 6:07 PM  Primary Physician: Viviana Simpler, MD  Chief Complaint:  Chief Complaint  Patient presents with  . Cough    x 2 days  . Nasal Congestion  . Generalized Body Aches    HPI: 63 y.o. year old male presents with a 3 day history of nasal congestion, post nasal drip, sore throat, and cough. Mild sinus pressure. Afebrile. No chills. Nasal congestion thick and green/yellow. Cough is productive of green/yellow sputum and not associated with time of day. Ears feel full, leading to sensation of muffled hearing. Has tried OTC cold preps without success. No GI complaints.   No sick contacts, recent antibiotics, or recent travels.   No leg trauma, sedentary periods, h/o cancer, or tobacco use.  Past Medical History  Diagnosis Date  . Allergy   . Depression   . GERD (gastroesophageal reflux disease)   . Hyperlipidemia   . Hypertension   . Glaucoma   . Diabetes   . Kidney stones   . BPH (benign prostatic hypertrophy)      Home Meds: Prior to Admission medications   Medication Sig Start Date End Date Taking? Authorizing Provider  ALPRAZolam (XANAX) 0.5 MG tablet TAKE 1 TABLET BY MOUTH 3 TIMES A DAY   Yes Ricard Dillon, MD  BYDUREON 2 MG SUSR INJECT 2 MG INTO THE SKIN ONCE A WEEK. 03/22/13  Yes Ricard Dillon, MD  hydrocortisone-pramoxine [facility]) 2.5-1 % rectal cream Place 1 application rectally 3 (three) times daily as needed. 07/30/12  Yes Ricard Dillon, MD  ketorolac Nancie Neas) 0.5 % ophthalmic solution USE AS DIRECTED 06/27/13  Yes Ricard Dillon, MD  NEXIUM 40 MG capsule TAKE 1 CAPSULE BY MOUTH 2 TIMES A DAY 01/03/13  Yes Ricard Dillon, MD  tamsulosin (FLOMAX) 0.4 MG CAPS capsule TAKE 1 CAPSULE (0.4 MG TOTAL) BY MOUTH DAILY AFTER SUPPER.   Yes Ricard Dillon, MD  temazepam (RESTORIL) 30 MG capsule TAKE ONE CAPSULE BY MOUTH EVERY DAY AS NEEDED SLEEP   Yes Ricard Dillon, MD    zolpidem (AMBIEN) 10 MG tablet TAKE 1 TABLET BY MOUTH AT BEDTIME   Yes Ricard Dillon, MD  atorvastatin (LIPITOR) 20 MG tablet Take 1 tablet (20 mg total) by mouth daily. Patient not taking: Reported on 03/06/2014 10/05/13   Venia Carbon, MD  azithromycin (ZITHROMAX Z-PAK) 250 MG tablet Take as directed on pack 03/06/14   Robyn Haber, MD  HYDROcodone-homatropine [facility]) 5-1.5 MG/5ML syrup Take 5 mLs by mouth every 8 (eight) hours as needed for cough. 03/06/14   Robyn Haber, MD  tadalafil (CIALIS) 5 MG tablet Take 1 tablet (5 mg total) by mouth daily. Patient not taking: Reported on 03/06/2014 10/05/13   Venia Carbon, MD    Allergies:  Allergies  Allergen Reactions  . Iohexol      Code: HIVES, Desc: PT IS ALLERGIC TO IVP DYE AS NOTED IN PREVIOUS RECORDS IN PACS 02/2009/RM, Onset Date: [id number]     History   Social History  . Marital Status: Married    Spouse Name: N/A    Number of Children: 1  . 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 Smoking status: Former Smoker    Quit date: 03/18/1983  . Smokeless tobacco: Never Used  . Alcohol Use: No  .  Drug Use: No  . Sexual Activity: Yes   Other Topics Concern  . Not on file   Social History Narrative     Review of Systems: Constitutional: negative for chills, fever, night sweats or weight changes Cardiovascular: negative for chest pain or palpitations Respiratory: negative for hemoptysis, wheezing, or shortness of breath Abdominal: negative for abdominal pain, nausea, vomiting or diarrhea Dermatological: negative for rash Neurologic: negative for headache   Physical Exam: Blood pressure 136/92, pulse 110, temperature 98.3 F (36.8 C), temperature source Oral, resp. rate 20, height 6\' 3"  (1.905 m), weight 287 lb (130.182 kg), SpO2 97 %., Body mass index is 35.87 kg/(m^2). General: Well developed, well nourished, in  no acute distress. Head: Normocephalic, atraumatic, eyes without discharge, sclera non-icteric, nares are congested. Bilateral auditory canals clear, TM's are without perforation, pearly grey with reflective cone of light bilaterally. No sinus TTP. Oral cavity moist, dentition normal. Posterior pharynx with post nasal drip and mild erythema. No peritonsillar abscess or tonsillar exudate. Neck: Supple. No thyromegaly. Full ROM. No lymphadenopathy. Lungs: Coarse breath sounds bilaterally without wheezes, rales, or rhonchi. Breathing is unlabored.  Heart: RRR with S1 S2. No murmurs, rubs, or gallops appreciated. Msk:  Strength and tone normal for age. Extremities: No clubbing or cyanosis. No edema. Neuro: Alert and oriented X 3. Moves all extremities spontaneously. CNII-XII grossly in tact. Psych:  Responds to questions appropriately with a normal affect.     ASSESSMENT AND PLAN:  63 y.o. year old male with bronchitis. -   ICD-9-CM ICD-10-CM   1. Acute bronchitis, unspecified organism 466.0 J20.9 azithromycin (ZITHROMAX Z-PAK) 250 MG tablet     HYDROcodone-homatropine (HYCODAN) 5-1.5 MG/5ML syrup  2. Acute maxillary sinusitis, recurrence not specified 461.0 J01.00 azithromycin (ZITHROMAX Z-PAK) 250 MG tablet     HYDROcodone-homatropine (HYCODAN) 5-1.5 MG/5ML syrup   -Tylenol/Motrin prn -Rest/fluids -RTC precautions -RTC 3-5 days if no improvement  Signed, Robyn Haber, MD 03/06/2014 6:07 PM

## 2014-03-09 ENCOUNTER — Telehealth: Payer: Self-pay

## 2014-03-09 NOTE — Telephone Encounter (Signed)
Patient recently saw Dr. Joseph Art recently and was advised to call if symptoms aren't getting any better. Patient states the is slightly better but still having symptoms. Please call! Patient phone: [phone]

## 2014-03-09 NOTE — Telephone Encounter (Signed)
He is starting to feel better but still has a lot of mucous. Ensured pt that he can use symptomatic treatment and let the abx work in his system. Advised pt to increase fluids while taking musinex to help thin mucous. Pt understands to RTC on Saturday if he is still not feeling well.

## 2014-04-04 ENCOUNTER — Encounter: Payer: Self-pay | Admitting: Internal Medicine

## 2014-04-05 MED ORDER — ZOLPIDEM TARTRATE 10 MG PO TABS: 10.0000 mg | ORAL_TABLET | Freq: Every day | ORAL | Status: DC

## 2014-04-05 MED ORDER — BYDUREON 2 MG ~~LOC~~ PEN: 2.0000 mg | PEN_INJECTOR | SUBCUTANEOUS | Status: DC

## 2014-04-05 MED ORDER — ESOMEPRAZOLE MAGNESIUM 40 MG PO CPDR: 40.0000 mg | DELAYED_RELEASE_CAPSULE | Freq: Two times a day (BID) | ORAL | Status: DC

## 2014-04-05 MED ORDER — TEMAZEPAM 30 MG PO CAPS: 30.0000 mg | ORAL_CAPSULE | Freq: Every evening | ORAL | Status: DC | PRN

## 2014-04-05 MED ORDER — ALPRAZOLAM 0.5 MG PO TABS: 0.5000 mg | ORAL_TABLET | Freq: Three times a day (TID) | ORAL | Status: DC

## 2014-04-05 NOTE — Telephone Encounter (Signed)
Approved: zolpidem alprazolam and temazepam for 1 month only Others for 1 year

## 2014-05-11 ENCOUNTER — Other Ambulatory Visit: Payer: Self-pay | Admitting: Internal Medicine

## 2014-05-12 ENCOUNTER — Ambulatory Visit (INDEPENDENT_AMBULATORY_CARE_PROVIDER_SITE_OTHER): Payer: BLUE CROSS/BLUE SHIELD | Admitting: Internal Medicine

## 2014-05-12 ENCOUNTER — Encounter: Payer: Self-pay | Admitting: Internal Medicine

## 2014-05-12 VITALS — BP 118/72 | HR 102 | Temp 97.8°F | Ht 74.75 in | Wt 284.1 lb

## 2014-05-12 DIAGNOSIS — G478 Other sleep disorders: Secondary | ICD-10-CM

## 2014-05-12 DIAGNOSIS — L570 Actinic keratosis: Secondary | ICD-10-CM

## 2014-05-12 DIAGNOSIS — G479 Sleep disorder, unspecified: Secondary | ICD-10-CM

## 2014-05-12 DIAGNOSIS — I10 Essential (primary) hypertension: Secondary | ICD-10-CM

## 2014-05-12 DIAGNOSIS — E1140 Type 2 diabetes mellitus with diabetic neuropathy, unspecified: Secondary | ICD-10-CM

## 2014-05-12 DIAGNOSIS — E1149 Type 2 diabetes mellitus with other diabetic neurological complication: Secondary | ICD-10-CM

## 2014-05-12 DIAGNOSIS — N400 Enlarged prostate without lower urinary tract symptoms: Secondary | ICD-10-CM

## 2014-05-12 DIAGNOSIS — E785 Hyperlipidemia, unspecified: Secondary | ICD-10-CM

## 2014-05-12 DIAGNOSIS — E1142 Type 2 diabetes mellitus with diabetic polyneuropathy: Secondary | ICD-10-CM

## 2014-05-12 DIAGNOSIS — Z0000 Encounter for general adult medical examination without abnormal findings: Secondary | ICD-10-CM

## 2014-05-12 HISTORY — DX: Actinic keratosis: L57.0

## 2014-05-12 LAB — CBC WITH DIFFERENTIAL/PLATELET
Basophils Absolute: 0 10*3/uL (ref 0.0–0.1)
Basophils Relative: 0.4 % (ref 0.0–3.0)
EOS PCT: 1.4 % (ref 0.0–5.0)
Eosinophils Absolute: 0.1 10*3/uL (ref 0.0–0.7)
HEMATOCRIT: 39.5 % (ref 39.0–52.0)
Hemoglobin: 13.8 g/dL (ref 13.0–17.0)
Lymphocytes Relative: 34 % (ref 12.0–46.0)
Lymphs Abs: 2 10*3/uL (ref 0.7–4.0)
MCHC: 34.9 g/dL (ref 30.0–36.0)
MCV: 85.1 fl (ref 78.0–100.0)
Monocytes Absolute: 0.4 10*3/uL (ref 0.1–1.0)
Monocytes Relative: 5.9 % (ref 3.0–12.0)
Neutro Abs: 3.5 10*3/uL (ref 1.4–7.7)
Neutrophils Relative %: 58.3 % (ref 43.0–77.0)
PLATELETS: 228 10*3/uL (ref 150.0–400.0)
RBC: 4.64 Mil/uL (ref 4.22–5.81)
RDW: 13 % (ref 11.5–15.5)
WBC: 6 10*3/uL (ref 4.0–10.5)

## 2014-05-12 LAB — COMPREHENSIVE METABOLIC PANEL
ALT: 31 U/L (ref 0–53)
AST: 20 U/L (ref 0–37)
Albumin: 4.2 g/dL (ref 3.5–5.2)
Alkaline Phosphatase: 95 U/L (ref 39–117)
BILIRUBIN TOTAL: 0.5 mg/dL (ref 0.2–1.2)
BUN: 11 mg/dL (ref 6–23)
CO2: 32 mEq/L (ref 19–32)
CREATININE: 0.96 mg/dL (ref 0.40–1.50)
Calcium: 9.3 mg/dL (ref 8.4–10.5)
Chloride: 104 mEq/L (ref 96–112)
GFR: 83.81 mL/min (ref >60.00)
Glucose, Bld: 142 mg/dL — ABNORMAL HIGH (ref 70–99)
Potassium: 4.8 mEq/L (ref 3.5–5.1)
Sodium: 139 mEq/L (ref 135–145)
Total Protein: 7.1 g/dL (ref 6.0–8.3)

## 2014-05-12 LAB — HM DIABETES FOOT EXAM

## 2014-05-12 LAB — T4, FREE: FREE T4: 0.9 ng/dL (ref 0.60–1.60)

## 2014-05-12 LAB — HEMOGLOBIN A1C: Hgb A1c MFr Bld: 7.4 % — ABNORMAL HIGH (ref 4.6–6.5)

## 2014-05-12 MED ORDER — CIPROFLOXACIN HCL 500 MG PO TABS: 500.0000 mg | ORAL_TABLET | Freq: Two times a day (BID) | ORAL | Status: DC

## 2014-05-12 NOTE — Patient Instructions (Signed)
Please get your eye exam

## 2014-05-12 NOTE — Assessment & Plan Note (Signed)
Now with AM headache and daytime somnolence Will get eval for sleep apnea

## 2014-05-12 NOTE — Telephone Encounter (Signed)
Patient seen 05/12/2014.

## 2014-05-12 NOTE — Progress Notes (Signed)
Subjective:    Patient ID: Alejandro Koch, male    DOB: 06-17-1950, 64 y.o.   MRN: [id number]  HPI Here for physical  No new issues Does have persistent urgency and frequency Gets burning sensation deep inside Has had past prostatitis Does have pressure in butt---like fecal urgency   Chronic RUQ pain Took the atorvastatin for 3 weeks --it made the pain much worse Stopped and the pain went away  Checks sugars most mornings and occasionally at night AM usually 110-140--- average ~130 randoms will be up to 200 No hypoglycemic reactions Still hasn't gotten eye exam Numbness but no pain in feet. Tingling. Not really in his hands but dexterity is poor  Current Outpatient Prescriptions on File Prior to Visit  Medication Sig Dispense Refill  . ALPRAZolam (XANAX) 0.5 MG tablet Take 1 tablet (0.5 mg total) by mouth 3 (three) times daily. 90 tablet 0  . BYDUREON 2 MG PEN Inject 2 mg as directed once a week. 4 each 11  . esomeprazole (NEXIUM) 40 MG capsule Take 1 capsule (40 mg total) by mouth 2 (two) times daily before a meal. 180 capsule 3  . hydrocortisone-pramoxine (ANALPRAM-HC) 2.5-1 % rectal cream Place 1 application rectally 3 (three) times daily as needed. 30 g 3  . ketorolac (ACULAR) 0.5 % ophthalmic solution USE AS DIRECTED 5 mL 1  . tamsulosin (FLOMAX) 0.4 MG CAPS capsule TAKE 1 CAPSULE (0.4 MG TOTAL) BY MOUTH DAILY AFTER SUPPER. 90 capsule 3  . temazepam (RESTORIL) 30 MG capsule Take 1 capsule (30 mg total) by mouth at bedtime as needed for sleep. 30 capsule 0  . zolpidem (AMBIEN) 10 MG tablet Take 1 tablet (10 mg total) by mouth at bedtime. 30 tablet 0   No current facility-administered medications on file prior to visit.    Allergies  Allergen Reactions  . Iohexol      Code: HIVES, Desc: PT IS ALLERGIC TO IVP DYE AS NOTED IN PREVIOUS RECORDS IN PACS 02/2009/RM, Onset Date: [id number]     Past Medical History  Diagnosis Date  . Allergy   . Depression   . GERD  (gastroesophageal reflux disease)   . Hyperlipidemia   . Hypertension   . Glaucoma   . Diabetes   . Kidney stones   . BPH (benign prostatic hypertrophy)     Past Surgical History  Procedure Laterality Date  . Colonoscopy  2008  . Finger surgery  2013    infection 2nd finger left hand    Family History  Problem Relation Age of Onset  . Leukemia    . Alzheimer's disease Mother   . Heart disease Father   . COPD Father   . Colon cancer Father 74  . Stomach cancer Neg Hx     History   Social History  . Marital Status: Married    Spouse Name: N/A  . Number of Children: 1  . 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 Smoking status: Former Smoker    Quit date: 03/18/1983  . Smokeless tobacco: Never Used  . Alcohol Use: No  . Drug Use: No  . Sexual Activity: Yes   Other Topics Concern  . Not on file   Social History Narrative   Review of Systems  Constitutional: Negative for fatigue and unexpected weight change.       Doesn't wear seat belt  HENT: Positive for  hearing loss and tinnitus.        High frequency hearing loss Keeps up with dentist  Eyes: Negative for visual disturbance.  Respiratory: Negative for cough, chest tightness and shortness of breath.   Cardiovascular: Positive for palpitations. Negative for chest pain and leg swelling.       Occ skips Mild edema  Gastrointestinal: Positive for abdominal pain and constipation. Negative for nausea, vomiting and blood in stool.  Endocrine: Negative for polydipsia.  Genitourinary: Positive for urgency, frequency and difficulty urinating.  Musculoskeletal: Negative for back pain, joint swelling and arthralgias.  Skin: Negative for rash.       Small spots on temples and left arm to be checked  Allergic/Immunologic: Negative for environmental allergies and immunocompromised state.  Neurological: Positive for numbness and  headaches. Negative for dizziness, syncope, weakness and light-headedness.       AM headaches recently--- goes away fairly quickly  Psychiatric/Behavioral: Positive for dysphoric mood. The patient is not nervous/anxious.        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 He appears well-nourished. No distress.  HENT:  Head: Normocephalic and atraumatic.  Right Ear: External ear normal.  Left Ear: External ear normal.  Mouth/Throat: Oropharynx is clear and moist. No oropharyngeal exudate.  Eyes: Conjunctivae and EOM are normal. Pupils are equal, round, and reactive to light.  Neck: Normal range of motion. Neck supple. No thyromegaly present.  Cardiovascular: Normal rate, regular rhythm, normal heart sounds and intact distal pulses.  Exam reveals no gallop.   No murmur heard. Pulmonary/Chest: Effort normal and breath sounds normal. No respiratory distress. He has no wheezes. He has no rales.  Abdominal: Soft. There is no tenderness.  Genitourinary:  Prostate normal size without nodule--moderately tender  Musculoskeletal: He exhibits no edema.  Lymphadenopathy:    He has no cervical adenopathy.  Neurological: He is alert and oriented to person, place, and time.  Decreased fine touch sensation in feet  Skin: No rash noted. No erythema.  No foot lesions  Psychiatric:  Not happy about job, etc          Assessment & Plan:

## 2014-05-12 NOTE — Assessment & Plan Note (Signed)
Seems to have good control Will check labs 

## 2014-05-12 NOTE — Assessment & Plan Note (Signed)
Statins worsen RUQ pain Will hold off

## 2014-05-12 NOTE — Assessment & Plan Note (Signed)
No sig pain so no Rx needed

## 2014-05-12 NOTE — Assessment & Plan Note (Signed)
UTD on cancer screening  prevnar next year Needs to work on fitness

## 2014-05-12 NOTE — Assessment & Plan Note (Signed)
Now with symptoms of acute prostatitis Will Rx with cipro for 3 weeks

## 2014-05-12 NOTE — Assessment & Plan Note (Signed)
BP Readings from Last 3 Encounters:  05/12/14 118/72  03/06/14 136/92  12/06/13 140/59   BP fine without meds No microalbumenuria

## 2014-05-12 NOTE — Assessment & Plan Note (Signed)
3 tender inflamed flaky spots in right preauricular area Rx with liquid nitrogen 30 seconds x 2 Tolerated well

## 2014-05-12 NOTE — Progress Notes (Signed)
Pre visit review using our clinic review tool, if applicable. No additional management support is needed unless otherwise documented below in the visit note. 

## 2014-05-12 NOTE — Telephone Encounter (Signed)
Approved: #90 x 0 for the alprazolam #30 x 0 for the other 2

## 2014-05-12 NOTE — Telephone Encounter (Signed)
Xanax, temazepam and ambien called into [facility] per Dr [name] approval.

## 2014-05-16 ENCOUNTER — Telehealth: Payer: Self-pay | Admitting: Internal Medicine

## 2014-05-16 NOTE — Telephone Encounter (Signed)
emmi emailed °

## 2014-06-07 ENCOUNTER — Encounter: Payer: BC Managed Care – PPO | Admitting: Internal Medicine

## 2014-06-19 ENCOUNTER — Other Ambulatory Visit: Payer: Self-pay | Admitting: Internal Medicine

## 2014-06-20 NOTE — Telephone Encounter (Signed)
Approved: 1 year for tamsulosin #90 x 0 for alprazolam #30 x 0 for the other 2

## 2014-06-20 NOTE — Telephone Encounter (Signed)
05/12/2014 for all

## 2014-06-20 NOTE — Telephone Encounter (Signed)
Called in Rx to [facility]  

## 2014-07-07 ENCOUNTER — Other Ambulatory Visit: Payer: Self-pay | Admitting: Internal Medicine

## 2014-07-10 ENCOUNTER — Encounter: Payer: Self-pay | Admitting: Pulmonary Disease

## 2014-07-10 ENCOUNTER — Ambulatory Visit (INDEPENDENT_AMBULATORY_CARE_PROVIDER_SITE_OTHER): Payer: BLUE CROSS/BLUE SHIELD | Admitting: Pulmonary Disease

## 2014-07-10 DIAGNOSIS — G4733 Obstructive sleep apnea (adult) (pediatric): Secondary | ICD-10-CM | POA: Diagnosis not present

## 2014-07-10 NOTE — Patient Instructions (Signed)
Will arrange for home sleep testing, and will call once results are available. Continue to work on weight loss.

## 2014-07-10 NOTE — Assessment & Plan Note (Signed)
The patient is having frequent awakenings with poor sleep, and is not rested in the mornings upon arising. He has morning headaches at times, and clearly has abnormal sleepiness with periods of inactivity during the day and evening. He is morbidly obese, has a large neck, and abnormal upper airway anatomy. Even though his bed partner is not complaining of loud snoring and witnessed apneas, I think he is at significant risk for sleep disordered breathing. I have had a long discussion with him about sleep apnea, including its impact to his quality of life and cardiovascular health. I think he would benefit from a sleep study for diagnosis, and he is a good candidate for home sleep testing.

## 2014-07-10 NOTE — Progress Notes (Signed)
Subjective:    Patient ID: Alejandro Koch, male    DOB: 25-Apr-1950, 64 y.o.   MRN: [id number]  HPI The patient is a 64 year old male who I've been asked to see for possible obstructive sleep apnea. Over the last few years he has seen his sleep worsen, and currently is having frequent awakenings and nonrestorative sleep. He also has occasional morning headaches. He is morbidly obese with a large neck, but his wife has not commented on loud snoring or witnessed apneas.  He does note definite sleepiness in the afternoon with inactivity, and will doze in the evenings watching television or movies. He also notes sleep pressure when driving. The patient states that his weight is down 30 pounds over the last 2 years, and his Epworth score today is 9   Sleep Questionnaire What time do you typically go to bed?( Between what hours) 11p-12a 11p-12a at 1335 on 07/10/14 by Inge Rise, CMA How long does it take you to fall asleep? depends 15 min w/ medication depends 15 min w/ medication at 1335 on 07/10/14 by Inge Rise, CMA How many times during the night do you wake up? 3 3 at 1335 on 07/10/14 by Inge Rise, [place] What time do you get out of bed to start your day? 0600 0600 at 1335 on 07/10/14 by Inge Rise, CMA Do you drive or operate heavy machinery in your occupation? No No at 1335 on 07/10/14 by Inge Rise, CMA How much has your weight changed (up or down) over the past two years? (In pounds) 30 lb (13.608 kg) 30 lb (13.608 kg) at 1335 on 07/10/14 by Inge Rise, CMA Have you ever had a sleep study before? No No at 1335 on 07/10/14 by Inge Rise, CMA Do you currently use CPAP? No No at 1335 on 07/10/14 by Inge Rise, CMA Do you wear oxygen at any time? No No at 1335 on 07/10/14 by Inge Rise, CMA   Review of Systems  Constitutional: Positive for unexpected weight change. Negative for fever.  HENT: Positive for ear pain. Negative for congestion, dental  problem, nosebleeds, postnasal drip, rhinorrhea, sinus pressure, sneezing, sore throat and trouble swallowing.   Eyes: Negative for redness and itching.  Respiratory: Negative for cough, chest tightness, shortness of breath and wheezing.   Cardiovascular: Positive for palpitations. Negative for leg swelling.  Gastrointestinal: Negative for nausea and vomiting.  Genitourinary: Negative for dysuria.  Musculoskeletal: Negative for joint swelling.  Skin: Negative for rash.  Neurological: Negative for headaches.  Hematological: Does not bruise/bleed easily.  Psychiatric/Behavioral: Negative for dysphoric mood. The patient is nervous/anxious.        Objective:   Physical Exam Constitutional:  Obese male, no acute distress  HENT:  Nares patent without discharge, deviated septum to left with narrowing  Oropharynx without exudate, palate and uvula thick and elongated.  Eyes:  Perrla, eomi, no scleral icterus  Neck:  No JVD, no TMG  Cardiovascular:  Normal rate, regular rhythm, no rubs or gallops.  No murmurs        Intact distal pulses  Pulmonary :  Normal breath sounds, no stridor or respiratory distress   No rales, rhonchi, or wheezing  Abdominal:  Soft, nondistended, bowel sounds present.  No tenderness noted.   Musculoskeletal:  No lower extremity edema noted.  Lymph Nodes:  No cervical lymphadenopathy noted  Skin:  No cyanosis noted  Neurologic:  Alert, appropriate, moves all 4 extremities without  obvious deficit.         Assessment & Plan:

## 2014-07-10 NOTE — Telephone Encounter (Signed)
Both last filled 06/20/14

## 2014-07-10 NOTE — Telephone Encounter (Signed)
Sent message back to the pharmacy stating it is too early

## 2014-07-10 NOTE — Telephone Encounter (Signed)
It is way too early for both of these. He needs to wait till the middle of next week

## 2014-07-13 ENCOUNTER — Encounter: Payer: Self-pay | Admitting: Internal Medicine

## 2014-07-13 MED ORDER — SULFAMETHOXAZOLE-TRIMETHOPRIM 800-160 MG PO TABS: 1.0000 | ORAL_TABLET | Freq: Two times a day (BID) | ORAL | Status: DC

## 2014-07-24 ENCOUNTER — Other Ambulatory Visit: Payer: Self-pay | Admitting: Pulmonary Disease

## 2014-07-24 DIAGNOSIS — G4733 Obstructive sleep apnea (adult) (pediatric): Secondary | ICD-10-CM

## 2014-07-28 ENCOUNTER — Ambulatory Visit (HOSPITAL_BASED_OUTPATIENT_CLINIC_OR_DEPARTMENT_OTHER): Payer: BLUE CROSS/BLUE SHIELD | Attending: Pulmonary Disease | Admitting: Radiology

## 2014-07-28 VITALS — Ht 77.0 in | Wt 280.0 lb

## 2014-07-28 DIAGNOSIS — G4733 Obstructive sleep apnea (adult) (pediatric): Secondary | ICD-10-CM | POA: Insufficient documentation

## 2014-08-03 ENCOUNTER — Other Ambulatory Visit: Payer: Self-pay | Admitting: Internal Medicine

## 2014-08-04 NOTE — Telephone Encounter (Signed)
rx called into pharmacy

## 2014-08-04 NOTE — Telephone Encounter (Signed)
06/20/14 for all 3 meds

## 2014-08-04 NOTE — Telephone Encounter (Signed)
Approved: 1 month x 0 for all three---same quantities

## 2014-08-10 ENCOUNTER — Telehealth: Payer: Self-pay | Admitting: Pulmonary Disease

## 2014-08-10 DIAGNOSIS — G4733 Obstructive sleep apnea (adult) (pediatric): Secondary | ICD-10-CM | POA: Diagnosis not present

## 2014-08-10 NOTE — Sleep Study (Signed)
   NAME: Alejandro Koch DATE OF BIRTH:  03/20/1950 MEDICAL RECORD NUMBER [id number]  [facility]  PHYSICIAN: Kathee Delton  DATE OF STUDY: 07/28/2014  SLEEP STUDY TYPE: Nocturnal Polysomnogram               REFERRING PHYSICIAN: Clance, Armando Reichert, MD  INDICATION FOR STUDY: Hypersomnia with sleep apnea  EPWORTH SLEEPINESS SCORE:  4 HEIGHT: 6\' 5"  (195.6 cm)  WEIGHT: 280 lb (127.007 kg)    Body mass index is 33.2 kg/(m^2).  NECK SIZE: 17 in.  MEDICATIONS: Reviewed in the sleep record  SLEEP ARCHITECTURE: The patient had a total sleep time of 279 minutes with no slow-wave sleep and only 67 minutes of REM. Onset latency was prolonged at 44 minutes, as was REM latency. Sleep efficiency was moderately reduced at 77%.  RESPIRATORY DATA: The patient was found to have 69 apneas and 40 obstructive hypopneas, giving him an AHI of 23 events per hour. The events occurred primarily in the supine position, and were accentuated during REM. There was moderate snoring noted throughout.  OXYGEN DATA: There was transient oxygen desaturation as low as 83% with the patient's obstructive events  CARDIAC DATA: Rare PVC noted  MOVEMENT/PARASOMNIA: The patient was found to have 452 periodic limb movements, with 12 per hour resulting in arousal or awakening.  IMPRESSION/ RECOMMENDATION:    1) moderate obstructive sleep apnea/hypopnea syndrome, with an AHI of 23 events per hour and oxygen desaturation as low as 83%. For this degree of sleep apnea should focus primarily on a trial of C Pap while working on aggressive weight loss.  2) rare PVC noted, but no clinically significant arrhythmias were seen  3) very large numbers of periodic limb movements with significant sleep disruption. It is unclear if this is associated with his sleep disordered breathing, or whether he may have a concomitant primary movement disorder of sleep. Clinical correlation is suggested once the patient has  optimal control of his documented sleep apnea.     Kathee Delton Diplomate, American Board of Sleep Medicine  ELECTRONICALLY SIGNED ON:  08/10/2014, 9:44 AM [place] PH: [phone]   FX: [phone] [place]

## 2014-08-10 NOTE — Telephone Encounter (Signed)
lmomtcb x1 

## 2014-08-10 NOTE — Telephone Encounter (Signed)
Pt cb, I sched appt for 08/16/14 9:15am, nothing further needed

## 2014-08-10 NOTE — Telephone Encounter (Signed)
Needs ov to review his sleep study.

## 2014-08-16 ENCOUNTER — Encounter: Payer: Self-pay | Admitting: Pulmonary Disease

## 2014-08-16 ENCOUNTER — Ambulatory Visit (INDEPENDENT_AMBULATORY_CARE_PROVIDER_SITE_OTHER): Payer: BLUE CROSS/BLUE SHIELD | Admitting: Pulmonary Disease

## 2014-08-16 VITALS — BP 128/62 | HR 79 | Ht 77.0 in | Wt 291.4 lb

## 2014-08-16 DIAGNOSIS — G4733 Obstructive sleep apnea (adult) (pediatric): Secondary | ICD-10-CM | POA: Diagnosis not present

## 2014-08-16 NOTE — Progress Notes (Signed)
   Subjective:    Patient ID: BEACHER EVERY, male    DOB: Sep 19, 1950, 64 y.o.   MRN: [id number]  HPI The patient comes in today for follow-up of his recent sleep study. He was found to have moderate OSA, with an AHI of 23 events per hour and oxygen desaturation as low as 83%. He was also found to have very large numbers of periodic limb movements with impact on sleep, but denies any symptoms consistent with the restless leg syndrome. I have reviewed the study with him in detail, and answered all of his questions.   Review of Systems  Constitutional: Negative for fever, chills, activity change, appetite change and unexpected weight change.  HENT: Negative for congestion, dental problem, ear pain, nosebleeds, postnasal drip, rhinorrhea, sinus pressure, sneezing, sore throat, trouble swallowing and voice change.   Eyes: Negative for redness, itching and visual disturbance.  Respiratory: Negative for cough, choking, chest tightness, shortness of breath and wheezing.   Cardiovascular: Negative for chest pain, palpitations and leg swelling.  Gastrointestinal: Positive for abdominal pain. Negative for nausea and vomiting.  Genitourinary: Positive for difficulty urinating. Negative for dysuria.  Musculoskeletal: Negative for joint swelling and arthralgias.  Skin: Negative for rash.  Neurological: Negative for headaches.  Hematological: Does not bruise/bleed easily.  Psychiatric/Behavioral: Negative for behavioral problems, confusion and dysphoric mood. The patient is not nervous/anxious.        Objective:   Physical Exam Overweight male in no acute distress Nose without purulence or discharge noted No skin breakdown or pressure necrosis from the C Pap mask Neck without lymphadenopathy or thyromegaly Lower extremities with mild edema, no cyanosis Alert and oriented, moves all 4 extremities.       Assessment & Plan:

## 2014-08-16 NOTE — Patient Instructions (Signed)
Will have our patient care coordinators check on your insurance issues, and will start on cpap when appropriate. Work on weight loss Once you start on cpap, followup with Dr. Halford Chessman in 8 weeks.

## 2014-08-16 NOTE — Assessment & Plan Note (Signed)
The patient has moderate obstructive sleep apnea by his recent study, and is very symptomatic during the day. I have discussed with him the various treatment options, and have recommended a trial of C Pap while working on weight loss. The patient is agreeable to trying this, but has insurance issues that we'll need to be clarified. I've also encouraged him to work aggressively on weight loss.

## 2014-08-19 LAB — HM DIABETES EYE EXAM

## 2014-09-05 ENCOUNTER — Other Ambulatory Visit: Payer: Self-pay | Admitting: Internal Medicine

## 2014-09-05 NOTE — Telephone Encounter (Signed)
Last filled 08/04/2014

## 2014-09-06 ENCOUNTER — Encounter: Payer: Self-pay | Admitting: Internal Medicine

## 2014-09-06 NOTE — Telephone Encounter (Signed)
rx called into pharmacy

## 2014-09-06 NOTE — Telephone Encounter (Signed)
Approved: okay 1 month for each in same quantity

## 2014-09-11 ENCOUNTER — Telehealth: Payer: Self-pay | Admitting: Pulmonary Disease

## 2014-09-11 NOTE — Telephone Encounter (Signed)
lmtcb

## 2014-09-11 NOTE — Telephone Encounter (Signed)
Pt cb,  [phone]

## 2014-09-11 NOTE — Telephone Encounter (Signed)
Patient says that his machine is not working properly.  He said that when he exhales, the machine will not release now, the pressure is very strong.  It used to release when he breaths out, now it won't, it stays at a constant stream of strong pressure.  He is afraid that he may have done something to the machine when he cleaned it.  He said he put the tubing in mineral water, soaking it, and let it dry out thoroughly before using.    Advised patient to take machine to [place] and have them look at it.   Advised him to call us back if we need to do anything. Nothing further needed.

## 2014-10-12 ENCOUNTER — Other Ambulatory Visit: Payer: Self-pay | Admitting: Internal Medicine

## 2014-10-13 NOTE — Telephone Encounter (Signed)
rx called into pharmacy

## 2014-10-13 NOTE — Telephone Encounter (Signed)
Last filled 09/06/2014 [facility] PATIENT, Please send back to me for call in

## 2014-10-13 NOTE — Telephone Encounter (Signed)
Ok to phone in [place] and xanax

## 2014-11-14 ENCOUNTER — Ambulatory Visit (INDEPENDENT_AMBULATORY_CARE_PROVIDER_SITE_OTHER): Payer: 59 | Admitting: Internal Medicine

## 2014-11-14 ENCOUNTER — Encounter: Payer: Self-pay | Admitting: Internal Medicine

## 2014-11-14 VITALS — BP 140/70 | HR 103 | Temp 98.2°F | Ht 75.5 in | Wt 287.0 lb

## 2014-11-14 DIAGNOSIS — I10 Essential (primary) hypertension: Secondary | ICD-10-CM

## 2014-11-14 DIAGNOSIS — N400 Enlarged prostate without lower urinary tract symptoms: Secondary | ICD-10-CM

## 2014-11-14 DIAGNOSIS — E1149 Type 2 diabetes mellitus with other diabetic neurological complication: Secondary | ICD-10-CM

## 2014-11-14 DIAGNOSIS — F39 Unspecified mood [affective] disorder: Secondary | ICD-10-CM | POA: Diagnosis not present

## 2014-11-14 DIAGNOSIS — E785 Hyperlipidemia, unspecified: Secondary | ICD-10-CM

## 2014-11-14 DIAGNOSIS — E1140 Type 2 diabetes mellitus with diabetic neuropathy, unspecified: Secondary | ICD-10-CM | POA: Diagnosis not present

## 2014-11-14 LAB — HEMOGLOBIN A1C: Hgb A1c MFr Bld: 7.7 % — ABNORMAL HIGH (ref 4.6–6.5)

## 2014-11-14 LAB — MICROALBUMIN / CREATININE URINE RATIO
Creatinine,U: 228.4 mg/dL
MICROALB/CREAT RATIO: 0.7 mg/g (ref 0.0–30.0)
Microalb, Ur: 1.6 mg/dL (ref 0.0–1.9)

## 2014-11-14 LAB — LIPID PANEL
CHOLESTEROL: 238 mg/dL — AB (ref 0–200)
HDL: 42 mg/dL (ref >39.00)
LDL Cholesterol: 169 mg/dL — ABNORMAL HIGH (ref 0–99)
NonHDL: 195.71
Total CHOL/HDL Ratio: 6
Triglycerides: 135 mg/dL (ref 0.0–149.0)
VLDL: 27 mg/dL (ref 0.0–40.0)

## 2014-11-14 MED ORDER — FINASTERIDE 5 MG PO TABS: 5.0000 mg | ORAL_TABLET | Freq: Every day | ORAL | Status: DC

## 2014-11-14 NOTE — Assessment & Plan Note (Signed)
Ongoing symptoms Will add finasteride Urology if symptoms persist

## 2014-11-14 NOTE — Assessment & Plan Note (Signed)
Has been intolerant of statins Will recheck levels

## 2014-11-14 NOTE — Progress Notes (Signed)
Pre visit review using our clinic review tool, if applicable. No additional management support is needed unless otherwise documented below in the visit note. 

## 2014-11-14 NOTE — Assessment & Plan Note (Signed)
Hopefully still has good control Will check A1c

## 2014-11-14 NOTE — Assessment & Plan Note (Signed)
Mostly anxiety and situational stress Uses meds for sleep mostly

## 2014-11-14 NOTE — Assessment & Plan Note (Signed)
BP Readings from Last 3 Encounters:  11/14/14 140/70  08/16/14 128/62  07/10/14 126/68   Borderline  Discussed fitness and weight loss ACEI if urine microal positive

## 2014-11-14 NOTE — Progress Notes (Signed)
Subjective:    Patient ID: Alejandro Koch, male    DOB: January 24, 1951, 64 y.o.   MRN: [id number]  HPI Follow up of diabetes and other chronic health issues  Has been checking sugars bid Average 150 Tends to get lower as the day goes on Still has numbness in feet--no sig pain  Still having full feeling with bladder Seemed better with antibiotics when he took it Very frequent nocturia He is worse if he misses the flomax  No chest pain Gets intermittent funny feeling-- with stress No SOB Gets palpitations--known long term PVCs. 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 ALPRAZolam (XANAX) 0.5 MG tablet TAKE 1 TABLET BY MOUTH 3 TIMES A DAY AS NEEDED 90 tablet 0  . aspirin 81 MG tablet Take 81 mg by mouth daily.    Marland Kitchen BYDUREON 2 MG PEN Inject 2 mg as directed once a week. 4 each 11  . esomeprazole (NEXIUM) 40 MG capsule Take 1 capsule (40 mg total) by mouth 2 (two) times daily before a meal. 180 capsule 3  . hydrocortisone-pramoxine (ANALPRAM-HC) 2.5-1 % rectal cream Place 1 application rectally 3 (three) times daily as needed. 30 g 3  . ketorolac (ACULAR) 0.5 % ophthalmic solution USE AS DIRECTED 5 mL 1  . tamsulosin (FLOMAX) 0.4 MG CAPS capsule TAKE 1 CAPSULE (0.4 MG TOTAL) BY MOUTH DAILY AFTER SUPPER. 90 capsule 3  . temazepam (RESTORIL) 30 MG capsule TAKE 1 CAPSULE BY MOUTH AT BEDTIME AS NEEDED 30 capsule 0  . zolpidem (AMBIEN) 10 MG tablet TAKE 1 TABLET BY MOUTH AT BEDTIME AS NEEDED 30 tablet 0   No current facility-administered medications on file prior to visit.    Allergies  Allergen Reactions  . Iohexol      Code: HIVES, Desc: PT IS ALLERGIC TO IVP DYE AS NOTED IN PREVIOUS RECORDS IN PACS 02/2009/RM, Onset Date: [id number]     Past Medical History  Diagnosis Date  . Allergy   .  Depression   . GERD (gastroesophageal reflux disease)   . Hyperlipidemia   . Hypertension   . Glaucoma   . Diabetes   . Kidney stones   . BPH (benign prostatic hypertrophy)     Past Surgical History  Procedure Laterality Date  . Colonoscopy  2008  . Finger surgery  2013    infection 2nd finger left hand    Family History  Problem Relation Age of Onset  . Leukemia    . Alzheimer's disease Mother   . Heart disease Father   . COPD Father   . Colon cancer Father 19  . Stomach cancer Neg Hx   . Prostate cancer Father     Social History   Social History  . Marital Status: Married    Spouse Name: N/A  . Number of Children: 1  . 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 Smoking status: Former Smoker -- 2.50 packs/day for 7 years    Types: Cigarettes    Quit date: 03/18/1983  . Smokeless tobacco: Never Used  . Alcohol Use: No  . Drug Use: No  . Sexual Activity: Yes   Other Topics Concern  . Not on file   Social History  Narrative   Review of Systems Sleeping with CPAP--more refreshed with this but still bothered by nocturia Weight is down 4# since last visit    Objective:   Physical Exam  Constitutional: He appears well-nourished. No distress.  Neck: Normal range of motion. Neck supple. No thyromegaly present.  Cardiovascular: Normal rate, regular rhythm, normal heart sounds and intact distal pulses.  Exam reveals no gallop.   No murmur heard. Pulmonary/Chest: Breath sounds normal. No respiratory distress. He has no wheezes. He has no rales.  Musculoskeletal: He exhibits no edema or tenderness.  Lymphadenopathy:    He has no cervical adenopathy.  Skin:  No foot lesions  Psychiatric: He has a normal mood and affect. His behavior is normal.          Assessment & Plan:

## 2014-11-21 ENCOUNTER — Encounter: Payer: Self-pay | Admitting: Internal Medicine

## 2014-12-11 ENCOUNTER — Other Ambulatory Visit: Payer: Self-pay | Admitting: Internal Medicine

## 2014-12-17 ENCOUNTER — Other Ambulatory Visit: Payer: Self-pay | Admitting: Internal Medicine

## 2014-12-18 NOTE — Telephone Encounter (Signed)
Xanax, Ambien 10/13/14 Temazepam 09/06/14

## 2014-12-18 NOTE — Telephone Encounter (Signed)
rx called into pharmacy

## 2014-12-18 NOTE — Telephone Encounter (Signed)
Approved: #30 x 0 for each of them

## 2015-01-26 ENCOUNTER — Other Ambulatory Visit: Payer: Self-pay | Admitting: *Deleted

## 2015-01-26 MED ORDER — TEMAZEPAM 30 MG PO CAPS: 30.0000 mg | ORAL_CAPSULE | Freq: Every evening | ORAL | Status: DC | PRN

## 2015-01-26 MED ORDER — ALPRAZOLAM 0.5 MG PO TABS: 0.5000 mg | ORAL_TABLET | Freq: Three times a day (TID) | ORAL | Status: DC | PRN

## 2015-01-26 MED ORDER — ZOLPIDEM TARTRATE 10 MG PO TABS: 10.0000 mg | ORAL_TABLET | Freq: Every evening | ORAL | Status: DC | PRN

## 2015-01-26 NOTE — Telephone Encounter (Signed)
rx called into pharmacy

## 2015-01-26 NOTE — Telephone Encounter (Signed)
12/18/2014 

## 2015-01-26 NOTE — Telephone Encounter (Signed)
Approved: #30 x 0 for each 

## 2015-04-02 ENCOUNTER — Encounter: Payer: Self-pay | Admitting: Internal Medicine

## 2015-04-18 ENCOUNTER — Other Ambulatory Visit: Payer: Self-pay | Admitting: Internal Medicine

## 2015-04-18 NOTE — Telephone Encounter (Signed)
01/26/2015 for both

## 2015-04-19 NOTE — Telephone Encounter (Signed)
rx called into pharmacy

## 2015-04-19 NOTE — Telephone Encounter (Signed)
Approved:  #30 x 0 for zolpidem #90 x 0 for alprazolam

## 2015-04-23 ENCOUNTER — Other Ambulatory Visit: Payer: Self-pay | Admitting: *Deleted

## 2015-04-23 NOTE — Telephone Encounter (Signed)
Last filled 06/27/2013 ok to fill? If so what are the instructions for use?

## 2015-04-23 NOTE — Telephone Encounter (Signed)
Spoke with patient and he got those drops from Dr. Arnoldo Morale a while ago and found the box and just requested a refill. i advised he would need to call his eye dr. Per pt he will.

## 2015-04-23 NOTE — Telephone Encounter (Signed)
Find out who gave him this and how he takes I don't prescribe this---likely from eye doctor

## 2015-04-24 ENCOUNTER — Other Ambulatory Visit: Payer: Self-pay | Admitting: Internal Medicine

## 2015-05-21 ENCOUNTER — Encounter: Payer: Self-pay | Admitting: Internal Medicine

## 2015-05-21 ENCOUNTER — Ambulatory Visit (INDEPENDENT_AMBULATORY_CARE_PROVIDER_SITE_OTHER): Payer: 59 | Admitting: Internal Medicine

## 2015-05-21 VITALS — BP 104/64 | HR 89 | Temp 98.5°F | Ht 77.0 in | Wt 284.0 lb

## 2015-05-21 DIAGNOSIS — R0609 Other forms of dyspnea: Secondary | ICD-10-CM

## 2015-05-21 DIAGNOSIS — Z0000 Encounter for general adult medical examination without abnormal findings: Secondary | ICD-10-CM | POA: Diagnosis not present

## 2015-05-21 DIAGNOSIS — R0602 Shortness of breath: Secondary | ICD-10-CM | POA: Insufficient documentation

## 2015-05-21 DIAGNOSIS — R079 Chest pain, unspecified: Secondary | ICD-10-CM | POA: Diagnosis not present

## 2015-05-21 DIAGNOSIS — E1149 Type 2 diabetes mellitus with other diabetic neurological complication: Secondary | ICD-10-CM | POA: Diagnosis not present

## 2015-05-21 DIAGNOSIS — Z23 Encounter for immunization: Secondary | ICD-10-CM | POA: Diagnosis not present

## 2015-05-21 DIAGNOSIS — N400 Enlarged prostate without lower urinary tract symptoms: Secondary | ICD-10-CM | POA: Diagnosis not present

## 2015-05-21 DIAGNOSIS — K760 Fatty (change of) liver, not elsewhere classified: Secondary | ICD-10-CM

## 2015-05-21 HISTORY — DX: Shortness of breath: R06.02

## 2015-05-21 LAB — LIPID PANEL
CHOL/HDL RATIO: 5
CHOLESTEROL: 227 mg/dL — AB (ref 0–200)
HDL: 45.7 mg/dL (ref >39.00)
LDL Cholesterol: 146 mg/dL — ABNORMAL HIGH (ref 0–99)
NonHDL: 181.17
TRIGLYCERIDES: 174 mg/dL — AB (ref 0.0–149.0)
VLDL: 34.8 mg/dL (ref 0.0–40.0)

## 2015-05-21 LAB — HM DIABETES FOOT EXAM

## 2015-05-21 LAB — CBC WITH DIFFERENTIAL/PLATELET
BASOS ABS: 0 10*3/uL (ref 0.0–0.1)
BASOS PCT: 0.3 % (ref 0.0–3.0)
EOS ABS: 0.1 10*3/uL (ref 0.0–0.7)
Eosinophils Relative: 2.3 % (ref 0.0–5.0)
HCT: 37.5 % — ABNORMAL LOW (ref 39.0–52.0)
Hemoglobin: 12.8 g/dL — ABNORMAL LOW (ref 13.0–17.0)
LYMPHS ABS: 2.2 10*3/uL (ref 0.7–4.0)
Lymphocytes Relative: 38.2 % (ref 12.0–46.0)
MCHC: 34.1 g/dL (ref 30.0–36.0)
MCV: 85.3 fl (ref 78.0–100.0)
MONO ABS: 0.4 10*3/uL (ref 0.1–1.0)
Monocytes Relative: 6 % (ref 3.0–12.0)
NEUTROS ABS: 3.1 10*3/uL (ref 1.4–7.7)
NEUTROS PCT: 53.2 % (ref 43.0–77.0)
PLATELETS: 215 10*3/uL (ref 150.0–400.0)
RBC: 4.39 Mil/uL (ref 4.22–5.81)
RDW: 13.4 % (ref 11.5–15.5)
WBC: 5.9 10*3/uL (ref 4.0–10.5)

## 2015-05-21 LAB — COMPREHENSIVE METABOLIC PANEL
ALT: 26 U/L (ref 0–53)
AST: 17 U/L (ref 0–37)
Albumin: 3.9 g/dL (ref 3.5–5.2)
Alkaline Phosphatase: 90 U/L (ref 39–117)
BILIRUBIN TOTAL: 0.4 mg/dL (ref 0.2–1.2)
BUN: 13 mg/dL (ref 6–23)
CHLORIDE: 102 meq/L (ref 96–112)
CO2: 30 meq/L (ref 19–32)
CREATININE: 0.86 mg/dL (ref 0.40–1.50)
Calcium: 8.9 mg/dL (ref 8.4–10.5)
GFR: 94.85 mL/min (ref >60.00)
Glucose, Bld: 211 mg/dL — ABNORMAL HIGH (ref 70–99)
Potassium: 4.8 mEq/L (ref 3.5–5.1)
SODIUM: 136 meq/L (ref 135–145)
Total Protein: 6.4 g/dL (ref 6.0–8.3)

## 2015-05-21 LAB — HEMOGLOBIN A1C: Hgb A1c MFr Bld: 8.7 % — ABNORMAL HIGH (ref 4.6–6.5)

## 2015-05-21 LAB — MICROALBUMIN / CREATININE URINE RATIO
CREATININE, U: 74.1 mg/dL
MICROALB/CREAT RATIO: 0.9 mg/g (ref 0.0–30.0)

## 2015-05-21 MED ORDER — CIPROFLOXACIN HCL 500 MG PO TABS: 500.0000 mg | ORAL_TABLET | Freq: Two times a day (BID) | ORAL | Status: DC

## 2015-05-21 NOTE — Progress Notes (Signed)
Subjective:    Patient ID: Alejandro Koch, male    DOB: Aug 19, 1950, 65 y.o.   MRN: [id number]  HPI Here for Welcome to Medicare visit and follow up of chronic health issues Actually only on Medicare A--- and still his [facility] via wife so will get physical  "I am progressively feeling worse.... 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 ALPRAZolam (XANAX) 0.5 MG tablet TAKE 1 TABLET BY MOUTH 3 TIMES DAILY AS NEEDED 90 tablet 0  . aspirin 81 MG tablet Take 81 mg by mouth daily.    Marland Kitchen BYDUREON 2 MG PEN INJECT 2 MG AS DIRECTED ONCE A WEEK. 4 each 11  . esomeprazole (NEXIUM) 40 MG capsule Take 1 capsule (40 mg total) by mouth 2 (two) times daily before a meal. 180 capsule 3  . finasteride (PROSCAR) 5 MG tablet Take 1 tablet (5 mg total) by mouth daily. 90 tablet 3  . hydrocortisone-pramoxine (ANALPRAM-HC) 2.5-1 % rectal cream Place 1 application rectally 3 (three) times daily as needed. 30 g 3  . ketorolac (ACULAR) 0.5 % ophthalmic solution USE AS DIRECTED 5 mL 1  . tamsulosin (FLOMAX) 0.4 MG CAPS capsule TAKE 1 CAPSULE (0.4 MG TOTAL) BY MOUTH DAILY AFTER SUPPER. 90 capsule 3  . temazepam (RESTORIL) 30 MG capsule Take 1 capsule (30 mg total) by mouth at bedtime as needed. 30 capsule 0  . zolpidem (AMBIEN) 10 MG tablet TAKE 1 TABLET BY  MOUTH AT BEDTIME AS NEEDED 30 tablet 0   No current facility-administered medications on file prior to visit.    Allergies  Allergen Reactions  . Iohexol      Code: HIVES, Desc: PT IS ALLERGIC TO IVP DYE AS NOTED IN PREVIOUS RECORDS IN PACS 02/2009/RM, Onset Date: [id number]   . Atorvastatin Other (See Comments)    Myalgias with this and rosuvastin    Past Medical History  Diagnosis Date  . Allergy   . Depression   . GERD (gastroesophageal reflux disease)   . Hyperlipidemia   . Hypertension   . Glaucoma   . Diabetes ([place])   . Kidney stones   . BPH (benign prostatic hypertrophy)     Past Surgical History  Procedure Laterality Date  . Colonoscopy  2008  . Finger surgery  2013    infection 2nd finger left hand    Family History  Problem Relation Age of Onset  . Leukemia    . Alzheimer's disease Mother   . Heart disease Father   . COPD Father   . Colon cancer Father 45  . Stomach cancer Neg Hx   . Prostate cancer Father     Social History   Social History  . Marital Status: Married    Spouse Name: N/A  . Number of Children: 1  . Years of Education: N/A  Occupational History  . 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 Smoking status: Former Smoker -- 2.50 packs/day for 7 years    Types: Cigarettes    Quit date: 03/18/1983  . Smokeless tobacco: Never Used  . Alcohol Use: No  . Drug Use: No  . Sexual Activity: Yes   Other Topics Concern  . Not on file   Social History Narrative   No living will or health care POA thus far   Wife, then son, should make decisions. May make son the primary person   Would accept resuscitation attempts but wouldn't want to be on machines   No tube feeds if cognitively unaware   Review of Systems  Constitutional: Positive for fatigue. Negative for unexpected weight change.       Wears seat belt  HENT: Positive for hearing loss and tinnitus. Negative for dental problem and trouble  swallowing.        Keeps up with dentist  Eyes: Negative for visual disturbance.       No diplopia or unilateral vision loss  Respiratory: Positive for shortness of breath. Negative for cough and chest tightness.        Has easier DOE  Cardiovascular: Positive for chest pain and palpitations. Negative for leg swelling.       Thinks the chest pain is from acid--happened when he came off nexium  Gastrointestinal: Negative for nausea, abdominal pain, constipation and blood in stool.  Endocrine: Positive for polyuria.  Genitourinary: Positive for frequency and difficulty urinating.  Skin:       Decreased skin turgor No suspicious skin lesions  Allergic/Immunologic: Positive for environmental allergies. Negative for immunocompromised state.       Eye problems---acular helps  Neurological: Positive for numbness. Negative for dizziness, syncope, weakness and light-headedness.  Hematological: Negative for adenopathy. Does not bruise/bleed easily.  Psychiatric/Behavioral: Positive for sleep disturbance and dysphoric mood. The patient is nervous/anxious.        Objective:   Physical Exam  Constitutional: He is oriented to person, place, and time. He appears well-developed and well-nourished. No distress.  HENT:  Head: Normocephalic and atraumatic.  Right Ear: External ear normal.  Left Ear: External ear normal.  Mouth/Throat: Oropharynx is clear and moist. No oropharyngeal exudate.  Eyes: Conjunctivae are normal. Pupils are equal, round, and reactive to light.  Neck: Normal range of motion. Neck supple. No thyromegaly present.  Cardiovascular: Normal rate, regular rhythm, normal heart sounds and intact distal pulses.  Exam reveals no gallop.   No murmur heard. Pulmonary/Chest: Effort normal and breath sounds normal. No respiratory distress. He has no wheezes. He has no rales.  Abdominal: Soft. There is no tenderness.  Genitourinary:  Prostate normal size but moderately tender  Scrotum is  quiet  Musculoskeletal: He exhibits no edema or tenderness.  Neurological: He is alert and oriented to person, place, and time.  Mild decreased sensation in plantar feet  Skin: No rash noted. No erythema.  No foot lesions  Psychiatric:  dysthymic          Assessment & Plan:

## 2015-05-21 NOTE — Addendum Note (Signed)
Addended by: Pilar Grammes on: 05/21/2015 05:08 PM   Modules accepted: Orders

## 2015-05-21 NOTE — Assessment & Plan Note (Signed)
Will recheck labs 

## 2015-05-21 NOTE — Assessment & Plan Note (Addendum)
Vague description but has been relating to acid Noticeable increased DOE and no energy EKG is normal Will set up treadmill stress test If normal--- start exercise

## 2015-05-21 NOTE — Patient Instructions (Signed)
Please take the cipro for 3 weeks. If you have ongoing urinary problems, I will send you back to the urologist.

## 2015-05-21 NOTE — Assessment & Plan Note (Signed)
prevnar today UTD on colon  Defer PSA as may have prostatitis Must start exercising

## 2015-05-21 NOTE — Progress Notes (Signed)
Pre visit review using our clinic review tool, if applicable. No additional management support is needed unless otherwise documented below in the visit note. 

## 2015-05-21 NOTE — Assessment & Plan Note (Signed)
Will recheck labs See him back soon---consider ultrasound

## 2015-05-21 NOTE — Assessment & Plan Note (Signed)
Increased symptoms May have element of prostatitis Will treat cipro for 3 weeks---if ongoing symptoms, will need to see urologist

## 2015-05-22 ENCOUNTER — Encounter: Payer: Self-pay | Admitting: Internal Medicine

## 2015-05-22 ENCOUNTER — Other Ambulatory Visit: Payer: Self-pay | Admitting: Internal Medicine

## 2015-05-22 MED ORDER — METFORMIN HCL ER 500 MG PO TB24: 1000.0000 mg | ORAL_TABLET | Freq: Every day | ORAL | Status: DC

## 2015-05-24 ENCOUNTER — Ambulatory Visit (INDEPENDENT_AMBULATORY_CARE_PROVIDER_SITE_OTHER): Payer: 59

## 2015-05-24 DIAGNOSIS — R079 Chest pain, unspecified: Secondary | ICD-10-CM

## 2015-05-24 LAB — EXERCISE TOLERANCE TEST
CHL CUP MPHR: 155 {beats}/min
CHL CUP RESTING HR STRESS: 90 {beats}/min
CHL CUP STRESS STAGE 1 DBP: 98 mmHg
CHL CUP STRESS STAGE 2 GRADE: 0 %
CHL CUP STRESS STAGE 3 GRADE: 0 %
CHL CUP STRESS STAGE 3 HR: 111 {beats}/min
CHL CUP STRESS STAGE 4 DBP: 115 mmHg
CHL CUP STRESS STAGE 4 GRADE: 10 %
CHL CUP STRESS STAGE 4 HR: 141 {beats}/min
CHL CUP STRESS STAGE 5 DBP: 120 mmHg
CHL CUP STRESS STAGE 5 SBP: 217 mmHg
CHL RATE OF PERCEIVED EXERTION: 14
CSEPED: 3 min
CSEPEDS: 0 s
CSEPEW: 4.6 METS
CSEPHR: 90 %
CSEPPBP: 211 mmHg
CSEPPHR: 141 {beats}/min
Percent of predicted max HR: 90 %
Stage 1 Grade: 0 %
Stage 1 HR: 106 {beats}/min
Stage 1 SBP: 168 mmHg
Stage 1 Speed: 0 mph
Stage 2 HR: 110 {beats}/min
Stage 2 Speed: 0.8 mph
Stage 3 Speed: 1 mph
Stage 4 SBP: 211 mmHg
Stage 4 Speed: 1.7 mph
Stage 5 Grade: 0 %
Stage 5 HR: 131 {beats}/min
Stage 5 Speed: 0 mph
Stage 6 DBP: 101 mmHg
Stage 6 Grade: 0 %
Stage 6 HR: 90 {beats}/min
Stage 6 SBP: 209 mmHg
Stage 6 Speed: 0 mph

## 2015-06-13 ENCOUNTER — Other Ambulatory Visit: Payer: Self-pay | Admitting: Internal Medicine

## 2015-06-24 ENCOUNTER — Other Ambulatory Visit: Payer: Self-pay | Admitting: Internal Medicine

## 2015-06-25 NOTE — Telephone Encounter (Signed)
Left refill on voice mail at pharmacy  

## 2015-06-25 NOTE — Telephone Encounter (Signed)
Approved: okay to fill each in same quantities with no refills

## 2015-06-25 NOTE — Telephone Encounter (Signed)
Last OV 05-21-15 Next OV 07-23-15 Zolpidem: last filled 04-19-15 #30 Alprazolam: Last Filled 04-19-15 #90 Temazepam: Last filled 01-26-15 #30

## 2015-07-23 ENCOUNTER — Encounter: Payer: Self-pay | Admitting: Internal Medicine

## 2015-07-23 ENCOUNTER — Ambulatory Visit (INDEPENDENT_AMBULATORY_CARE_PROVIDER_SITE_OTHER): Payer: Self-pay | Admitting: Internal Medicine

## 2015-07-23 VITALS — BP 138/76 | HR 78 | Temp 97.6°F | Wt 281.5 lb

## 2015-07-23 DIAGNOSIS — N400 Enlarged prostate without lower urinary tract symptoms: Secondary | ICD-10-CM

## 2015-07-23 DIAGNOSIS — K2100 Gastro-esophageal reflux disease with esophagitis, without bleeding: Secondary | ICD-10-CM

## 2015-07-23 DIAGNOSIS — D649 Anemia, unspecified: Secondary | ICD-10-CM

## 2015-07-23 DIAGNOSIS — E1140 Type 2 diabetes mellitus with diabetic neuropathy, unspecified: Secondary | ICD-10-CM

## 2015-07-23 DIAGNOSIS — E1165 Type 2 diabetes mellitus with hyperglycemia: Secondary | ICD-10-CM

## 2015-07-23 DIAGNOSIS — IMO0002 Reserved for concepts with insufficient information to code with codable children: Secondary | ICD-10-CM

## 2015-07-23 DIAGNOSIS — I10 Essential (primary) hypertension: Secondary | ICD-10-CM

## 2015-07-23 MED ORDER — ESOMEPRAZOLE MAGNESIUM 40 MG PO CPDR
40.0000 mg | DELAYED_RELEASE_CAPSULE | Freq: Two times a day (BID) | ORAL | Status: DC
Start: 2015-07-23 — End: 2016-06-03

## 2015-07-23 MED ORDER — OLMESARTAN MEDOXOMIL-HCTZ 20-12.5 MG PO TABS: 1.0000 | ORAL_TABLET | Freq: Every day | ORAL | Status: DC

## 2015-07-23 MED ORDER — SITAGLIPTIN PHOSPHATE 100 MG PO TABS: 100.0000 mg | ORAL_TABLET | Freq: Every day | ORAL | Status: DC

## 2015-07-23 NOTE — Progress Notes (Signed)
Pre visit review using our clinic review tool, if applicable. No additional management support is needed unless otherwise documented below in the visit note. 

## 2015-07-23 NOTE — Assessment & Plan Note (Signed)
Will check FIT If positive, will need to see GI again

## 2015-07-23 NOTE — Assessment & Plan Note (Signed)
Tried omeprazole and symptoms all came back Will order the nexium again

## 2015-07-23 NOTE — Assessment & Plan Note (Signed)
Unable to tolerate meformin Will change to [place] He is exercising regularly Will recheck 3 months Discussed healthy eating and medication plans for all of 15 minute visit

## 2015-07-23 NOTE — Assessment & Plan Note (Signed)
BP Readings from Last 3 Encounters:  07/23/15 138/76  05/21/15 104/64  11/14/14 140/70   Was getting elevated BP at home Went back on his benicar

## 2015-07-23 NOTE — Progress Notes (Signed)
Subjective:    Patient ID: Alejandro Koch, male    DOB: 01-09-1951, 65 y.o.   MRN: [id number]  HPI Here due to problems with metformin "It gives me the runs" Not able to take it unless he is staying in the house Only able to take one a day Hasn't been checking sugars ---machine broke and he needs new one  Does feel better with the prostate Still has increased frequency but no longer weak and tired Basically feels back to normal Still has sense "of something up my butt"  Reviewed anemia Borderline Did have normal colon 3 years ago  Current Outpatient Prescriptions on File Prior to Visit  Medication Sig Dispense Refill  . ALPRAZolam (XANAX) 0.5 MG tablet TAKE 1 TABLET BY MOUTH 3 TIMES A DAY AS NEEDED 90 tablet 0  . aspirin 81 MG tablet Take 81 mg by mouth daily.    Marland Kitchen BYDUREON 2 MG PEN INJECT 2 MG AS DIRECTED ONCE A WEEK. 4 each 11  . finasteride (PROSCAR) 5 MG tablet Take 1 tablet (5 mg total) by mouth daily. 90 tablet 3  . hydrocortisone-pramoxine (ANALPRAM-HC) 2.5-1 % rectal cream Place 1 application rectally 3 (three) times daily as needed. 30 g 3  . ketorolac (ACULAR) 0.5 % ophthalmic solution USE AS DIRECTED 5 mL 1  . tamsulosin (FLOMAX) 0.4 MG CAPS capsule TAKE 1 CAPSULE (0.4 MG TOTAL) BY MOUTH DAILY AFTER SUPPER. 90 capsule 0  . temazepam (RESTORIL) 30 MG capsule TAKE 1 CAPSULE BY MOUTH AT BEDTIME AS NEEDED 30 capsule 0  . zolpidem (AMBIEN) 10 MG tablet TAKE 1 TABLET BY MOUTH AT BEDTIME AS NEEDED 30 tablet 0   No current facility-administered medications on file prior to visit.    Allergies  Allergen Reactions  . Iohexol      Code: HIVES, Desc: PT IS ALLERGIC TO IVP DYE AS NOTED IN PREVIOUS RECORDS IN PACS 02/2009/RM, Onset Date: [id number]   . Atorvastatin Other (See Comments)    Myalgias with this and rosuvastin  . Metformin And Related Diarrhea    Past Medical History  Diagnosis Date  . Allergy   . Depression   . GERD (gastroesophageal reflux disease)   .  Hyperlipidemia   . Hypertension   . Glaucoma   . Diabetes ([place])   . Kidney stones   . BPH (benign prostatic hypertrophy)     Past Surgical History  Procedure Laterality Date  . Colonoscopy  2008  . Finger surgery  2013    infection 2nd finger left hand    Family History  Problem Relation Age of Onset  . Leukemia    . Alzheimer's disease Mother   . Heart disease Father   . COPD Father   . Colon cancer Father 67  . Stomach cancer Neg Hx   . Prostate cancer Father     Social History   Social History  . Marital Status: Married    Spouse Name: N/A  . Number of Children: 1  . 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 Smoking status: Former Smoker -- 2.50 packs/day for 7 years    Types: Cigarettes    Quit date: 03/18/1983  . Smokeless tobacco: Never Used  . Alcohol Use: No  . Drug Use: No  . Sexual Activity: Yes   Other Topics Concern  . Not on file   Social History Narrative  No living will or health care POA thus far   Wife, then son, should make decisions. May make son the primary person   Would accept resuscitation attempts but wouldn't want to be on machines   No tube feeds if cognitively unaware   Review of Systems Trying to walk daily now-- 2 miles when he can Not as good with healthy eating as he needs to be Weight is down 3# though    Objective:   Physical Exam        Assessment & Plan:

## 2015-07-23 NOTE — Assessment & Plan Note (Signed)
No longer sick but still voiding frequently Will hold off on PSA testing till next time

## 2015-07-24 ENCOUNTER — Telehealth: Payer: Self-pay

## 2015-07-24 NOTE — Telephone Encounter (Signed)
I filled out the forms for both Januvia and Nexium and faxed back to [employer]. I can take up to 2 weeks to hear back from them

## 2015-07-26 ENCOUNTER — Telehealth: Payer: Self-pay

## 2015-07-26 ENCOUNTER — Encounter: Payer: Self-pay | Admitting: Internal Medicine

## 2015-07-26 MED ORDER — ONETOUCH VERIO FLEX SYSTEM W/DEVICE KIT: 1.0000 [IU] | PACK | Freq: Once | Status: DC

## 2015-07-26 MED ORDER — ONETOUCH DELICA LANCETS FINE MISC: 1.0000 [IU] | Freq: Every day | Status: DC

## 2015-07-26 MED ORDER — GLUCOSE BLOOD VI STRP: ORAL_STRIP | Status: DC

## 2015-07-26 NOTE — Telephone Encounter (Signed)
Form filled out and faxed back to [facility]. Waiting on response

## 2015-07-26 NOTE — Telephone Encounter (Signed)
Received a fax from [place] that Nexium is not covered on his medication plan. He will need to call his insurance and find out what they will cover. Spoke to the patient. He will call me back with information.

## 2015-07-27 ENCOUNTER — Telehealth: Payer: Self-pay

## 2015-07-27 MED ORDER — LINAGLIPTIN 5 MG PO TABS: 5.0000 mg | ORAL_TABLET | Freq: Every day | ORAL | Status: DC

## 2015-07-27 NOTE — Telephone Encounter (Signed)
Spoke to patient. Advised him of the needed change. He said to send it to the local [facility]. Sent in new rx for Linagliptin (Trajenta) to replace Januvia.

## 2015-07-27 NOTE — Telephone Encounter (Signed)
Received documentation that the Januvia prior auth was denied because he has to have a documented trial of at least 3 months on Trajenta. Do you want to change him to this?

## 2015-07-27 NOTE — Telephone Encounter (Signed)
That would be fine---same class and basically about the same as the [place] Should be on 5mg  daily (#30 x 11)

## 2015-08-07 ENCOUNTER — Other Ambulatory Visit (INDEPENDENT_AMBULATORY_CARE_PROVIDER_SITE_OTHER): Payer: Medicare Other

## 2015-08-07 DIAGNOSIS — D649 Anemia, unspecified: Secondary | ICD-10-CM

## 2015-08-08 LAB — FECAL OCCULT BLOOD, IMMUNOCHEMICAL: Fecal Occult Bld: NEGATIVE

## 2015-09-06 ENCOUNTER — Other Ambulatory Visit: Payer: Self-pay | Admitting: Internal Medicine

## 2015-09-07 NOTE — Telephone Encounter (Signed)
CPE 05/2015

## 2015-09-08 NOTE — Telephone Encounter (Signed)
Approved: 30 x 0 

## 2015-09-10 NOTE — Telephone Encounter (Signed)
Left refill on voice mail at pharmacy  

## 2015-09-14 ENCOUNTER — Encounter: Payer: Self-pay | Admitting: Internal Medicine

## 2015-09-14 MED ORDER — GLIPIZIDE ER 5 MG PO TB24: 5.0000 mg | ORAL_TABLET | Freq: Every day | ORAL | Status: DC

## 2015-09-14 MED ORDER — CIPROFLOXACIN HCL 500 MG PO TABS: 500.0000 mg | ORAL_TABLET | Freq: Two times a day (BID) | ORAL | Status: DC

## 2015-09-14 NOTE — Telephone Encounter (Signed)
Spoke to patient Alejandro Koch refill the cipro but if this keeps recurring --Alejandro Koch send to urologist  Pharmacist correct that trajenta probably won't help on top of the bydureon---so Alejandro Koch not use this. Try glipizide instead

## 2015-09-16 ENCOUNTER — Other Ambulatory Visit: Payer: Self-pay | Admitting: Internal Medicine

## 2015-10-08 ENCOUNTER — Other Ambulatory Visit: Payer: Self-pay | Admitting: Internal Medicine

## 2015-10-08 ENCOUNTER — Telehealth: Payer: Self-pay | Admitting: Primary Care

## 2015-10-08 ENCOUNTER — Other Ambulatory Visit: Payer: Self-pay | Admitting: Primary Care

## 2015-10-08 DIAGNOSIS — N411 Chronic prostatitis: Secondary | ICD-10-CM

## 2015-10-08 NOTE — Telephone Encounter (Signed)
Please notify patient that Dr. Lelon Huh is out of the office this week. I have reviewed his chart and have placed a referral to Urology for further evaluation of recurrent symptoms from his prostate which is what Dr. Lelon Huh mentioned on his last note.  Please have him call us if he develops fevers, chills, or his symptoms become worse. I did not approve a refill of his Cipro at this time as he's been on this for several doses which can lead to side effects.

## 2015-10-09 NOTE — Telephone Encounter (Signed)
Message left for patient to return my call.  

## 2015-10-11 NOTE — Telephone Encounter (Signed)
Noted  Referral placed.

## 2015-10-11 NOTE — Telephone Encounter (Signed)
Spoken and notified patient of Kate's comments. Patient verbalized understanding.  Patient stated that he would like to go to [facility].  Patient also stated that when he on Cipro, the burning would go away but return when finish the treatment. Even though he is taking Flomax.

## 2015-10-11 NOTE — Telephone Encounter (Signed)
Patient said he can be reached before 10:00 am today or after 1:00 at [phone].  Patient said you could leave a detailed message on my chart.

## 2015-10-23 ENCOUNTER — Ambulatory Visit: Payer: 59 | Admitting: Internal Medicine

## 2015-10-29 ENCOUNTER — Encounter: Payer: Self-pay | Admitting: Internal Medicine

## 2015-10-29 ENCOUNTER — Ambulatory Visit (INDEPENDENT_AMBULATORY_CARE_PROVIDER_SITE_OTHER): Payer: 59 | Admitting: Internal Medicine

## 2015-10-29 DIAGNOSIS — E1140 Type 2 diabetes mellitus with diabetic neuropathy, unspecified: Secondary | ICD-10-CM

## 2015-10-29 DIAGNOSIS — E1165 Type 2 diabetes mellitus with hyperglycemia: Secondary | ICD-10-CM | POA: Diagnosis not present

## 2015-10-29 DIAGNOSIS — IMO0002 Reserved for concepts with insufficient information to code with codable children: Secondary | ICD-10-CM

## 2015-10-29 LAB — HEMOGLOBIN A1C: Hgb A1c MFr Bld: 7.7 % — ABNORMAL HIGH (ref 4.6–6.5)

## 2015-10-29 NOTE — Assessment & Plan Note (Signed)
Hopefully better on the glipizide Will check A1c Discussed improving his compliance with healthy eating

## 2015-10-29 NOTE — Progress Notes (Signed)
Subjective:    Patient ID: Alejandro Koch, male    DOB: Apr 15, 1950, 65 y.o.   MRN: [id number]  HPI Here for follow up of his diabetes  Doing okay on glipizide Checks twice a day now Highest is 220 after going out to eat Fasting usually ~140-150. As low as 120 Has had a few times in 80's and felt slightly groggy---easy to reverse (with something)  Has "not been as disciplined as I should be" with his eating Trying 2-3 miles a day-- 4 days per week. Has picked up speed Feels better for this  Current Outpatient Prescriptions on File Prior to Visit  Medication Sig Dispense Refill  . ALPRAZolam (XANAX) 0.5 MG tablet TAKE 1 TABLET BY MOUTH 3 TIMES A DAY AS NEEDED 90 tablet 0  . aspirin 81 MG tablet Take 81 mg by mouth daily.    . Blood Glucose Monitoring Suppl (ONETOUCH VERIO FLEX SYSTEM) w/Device KIT 1 Units by Does not apply route once. 1 kit 0  . BYDUREON 2 MG PEN INJECT 2 MG AS DIRECTED ONCE A WEEK. 4 each 11  . esomeprazole (NEXIUM) 40 MG capsule Take 1 capsule (40 mg total) by mouth 2 (two) times daily before a meal. 60 capsule 11  . finasteride (PROSCAR) 5 MG tablet Take 1 tablet (5 mg total) by mouth daily. 90 tablet 3  . glipiZIDE (GLUCOTROL XL) 5 MG 24 hr tablet Take 1 tablet (5 mg total) by mouth daily with breakfast. 30 tablet 11  . glucose blood (ONETOUCH VERIO) test strip Use 1 strip daily to check blood sugar 100 each 3  . hydrocortisone-pramoxine (ANALPRAM-HC) 2.5-1 % rectal cream Place 1 application rectally 3 (three) times daily as needed. 30 g 3  . ketorolac (ACULAR) 0.5 % ophthalmic solution USE AS DIRECTED 5 mL 1  . olmesartan-hydrochlorothiazide (BENICAR HCT) 20-12.5 MG tablet Take 1 tablet by mouth daily. 30 tablet 11  . ONETOUCH DELICA LANCETS FINE MISC 1 Units by Does not apply route daily. 100 each 2  . tamsulosin (FLOMAX) 0.4 MG CAPS capsule TAKE 1 CAPSULE (0.4 MG TOTAL) BY MOUTH DAILY AFTER SUPPER. 90 capsule 3  . temazepam (RESTORIL) 30 MG capsule TAKE 1  CAPSULE BY MOUTH AT BEDTIME AS NEEDED 30 capsule 0  . zolpidem (AMBIEN) 10 MG tablet TAKE 1 TABLET BY MOUTH AT BEDTIME AS NEEDED 30 tablet 0   No current facility-administered medications on file prior to visit.     Allergies  Allergen Reactions  . Iohexol      Code: HIVES, Desc: PT IS ALLERGIC TO IVP DYE AS NOTED IN PREVIOUS RECORDS IN PACS 02/2009/RM, Onset Date: [id number]   . Atorvastatin Other (See Comments)    Myalgias with this and rosuvastin  . Metformin And Related Diarrhea    Past Medical History:  Diagnosis Date  . Allergy   . BPH (benign prostatic hypertrophy)   . Depression   . Diabetes ([place])   . GERD (gastroesophageal reflux disease)   . Glaucoma   . Hyperlipidemia   . Hypertension   . Kidney stones     Past Surgical History:  Procedure Laterality Date  . COLONOSCOPY  2008  . FINGER SURGERY  2013   infection 2nd finger left hand    Family History  Problem Relation Age of Onset  . Leukemia    . Alzheimer's disease Mother   . Heart disease Father   . COPD Father   . Colon cancer Father 44  . Stomach cancer  Neg Hx   . Prostate cancer Father     Social History   Social History  . Marital status: Married    Spouse name: N/A  . Number of children: 1  . 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 Smoking status: Former Smoker    Packs/day: 2.50    Years: 7.00    Types: Cigarettes    Quit date: 03/18/1983  . Smokeless tobacco: Never Used  . Alcohol use No  . Drug use: No  . Sexual activity: Yes   Other Topics Concern  . Not on file   Social History Narrative   No living will or health care POA thus far   Wife, then son, should make decisions. May make son the primary person   Would accept resuscitation attempts but wouldn't want to be on machines   No tube feeds if cognitively unaware   Review of Systems Weight is stable Still not sleeping well.  Alternates restoril and ambien. Doesn't sleep at all if he skips meds Due for eye exam    Objective:   Physical Exam  Constitutional: He appears well-developed and well-nourished.  Neck: Normal range of motion. No thyromegaly present.  Cardiovascular: Normal rate, regular rhythm, normal heart sounds and intact distal pulses.  Exam reveals no gallop.   No murmur heard. Pulmonary/Chest: Effort normal and breath sounds normal. No respiratory distress. He has no wheezes. He has no rales.  Lymphadenopathy:    He has no cervical adenopathy.  Skin: No rash noted. No erythema.  No foot lesions  Psychiatric: He has a normal mood and affect. His behavior is normal.          Assessment & Plan:

## 2015-10-29 NOTE — Progress Notes (Signed)
Pre visit review using our clinic review tool, if applicable. No additional management support is needed unless otherwise documented below in the visit note. 

## 2015-11-01 ENCOUNTER — Other Ambulatory Visit: Payer: Self-pay | Admitting: Internal Medicine

## 2015-11-04 ENCOUNTER — Other Ambulatory Visit: Payer: Self-pay | Admitting: Internal Medicine

## 2016-01-13 ENCOUNTER — Other Ambulatory Visit: Payer: Self-pay | Admitting: Internal Medicine

## 2016-01-14 NOTE — Telephone Encounter (Signed)
Zolpidem last filled 09-10-15 #30 Alprazolam last filled 06-25-15 #90  Last OV 10-29-15 Next OV 06-03-16  Forwarding to Dr Lorelei Pont in Dr Alla German absence

## 2016-01-14 NOTE — Telephone Encounter (Signed)
ambien ok, 30, 0 ref  Xanax ok, 90, 0 ref

## 2016-01-14 NOTE — Telephone Encounter (Signed)
Left refill on voice mail at pharmacy  

## 2016-03-08 ENCOUNTER — Other Ambulatory Visit: Payer: Self-pay | Admitting: Internal Medicine

## 2016-03-18 ENCOUNTER — Other Ambulatory Visit: Payer: Self-pay | Admitting: Internal Medicine

## 2016-03-18 NOTE — Telephone Encounter (Signed)
Approved: okay zolpidem #30 x 0 Alprazolam #90 x 0

## 2016-03-18 NOTE — Telephone Encounter (Signed)
Left refill on voice mail at pharmacy  

## 2016-03-18 NOTE — Telephone Encounter (Signed)
Zolpidem tartrate #30 01-14-16 Alprazolam #90 01-14-16  Last OV 10-29-15 Next OV 06-03-16

## 2016-04-05 ENCOUNTER — Other Ambulatory Visit: Payer: Self-pay | Admitting: Internal Medicine

## 2016-04-07 ENCOUNTER — Other Ambulatory Visit: Payer: Self-pay | Admitting: Internal Medicine

## 2016-04-07 MED ORDER — TEMAZEPAM 30 MG PO CAPS: 30.0000 mg | ORAL_CAPSULE | Freq: Every evening | ORAL | 0 refills | Status: DC | PRN

## 2016-04-07 NOTE — Telephone Encounter (Signed)
Left refill on voice mail at pharmacy  

## 2016-04-07 NOTE — Telephone Encounter (Signed)
Temazepam last filled 06-25-15 #30 Last OV 10-29-15 Next OV 06-03-16  Too early for the zolpidem and alprazolam. Both last filled 03-18-16

## 2016-04-07 NOTE — Telephone Encounter (Signed)
Approved: #30 x 0 for the temazepam

## 2016-04-10 ENCOUNTER — Encounter: Payer: Self-pay | Admitting: Internal Medicine

## 2016-04-11 ENCOUNTER — Other Ambulatory Visit: Payer: Self-pay | Admitting: Internal Medicine

## 2016-04-14 NOTE — Telephone Encounter (Signed)
Last filled 03-18-16 #90 Last OV 10-29-15 Next OV 06-13-16

## 2016-04-14 NOTE — Telephone Encounter (Signed)
Spoke to pt. He said he understands. He has the rx on automatic refill and the pharmacy must have sent it in. We talked about his sleep medication regimen and how he did not really understand cutting back. I tried to explain that he is on 3 controlled medications and that could be part of the issue. Our physicians are being monitored as well as they do not like writing a lot of controlled meds for 1 pt. He said he understood. 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

## 2016-04-14 NOTE — Telephone Encounter (Signed)
Approved: okay to fill now but let him know he is early #90 x 0 He will not be due again till ~ 3/3

## 2016-06-03 ENCOUNTER — Encounter: Payer: Self-pay | Admitting: Internal Medicine

## 2016-06-03 ENCOUNTER — Ambulatory Visit (INDEPENDENT_AMBULATORY_CARE_PROVIDER_SITE_OTHER): Payer: 59 | Admitting: Internal Medicine

## 2016-06-03 VITALS — BP 138/84 | HR 94 | Temp 98.1°F | Ht 74.5 in | Wt 287.0 lb

## 2016-06-03 DIAGNOSIS — F39 Unspecified mood [affective] disorder: Secondary | ICD-10-CM | POA: Diagnosis not present

## 2016-06-03 DIAGNOSIS — E1140 Type 2 diabetes mellitus with diabetic neuropathy, unspecified: Secondary | ICD-10-CM

## 2016-06-03 DIAGNOSIS — K219 Gastro-esophageal reflux disease without esophagitis: Secondary | ICD-10-CM | POA: Diagnosis not present

## 2016-06-03 DIAGNOSIS — Z0000 Encounter for general adult medical examination without abnormal findings: Secondary | ICD-10-CM | POA: Diagnosis not present

## 2016-06-03 DIAGNOSIS — N138 Other obstructive and reflux uropathy: Secondary | ICD-10-CM

## 2016-06-03 DIAGNOSIS — I10 Essential (primary) hypertension: Secondary | ICD-10-CM | POA: Diagnosis not present

## 2016-06-03 DIAGNOSIS — E1165 Type 2 diabetes mellitus with hyperglycemia: Secondary | ICD-10-CM

## 2016-06-03 DIAGNOSIS — N401 Enlarged prostate with lower urinary tract symptoms: Secondary | ICD-10-CM

## 2016-06-03 DIAGNOSIS — IMO0002 Reserved for concepts with insufficient information to code with codable children: Secondary | ICD-10-CM

## 2016-06-03 HISTORY — DX: Gastro-esophageal reflux disease without esophagitis: K21.9

## 2016-06-03 LAB — LIPID PANEL
Cholesterol: 237 mg/dL — ABNORMAL HIGH (ref 0–200)
HDL: 49.4 mg/dL (ref >39.00)
LDL Cholesterol: 163 mg/dL — ABNORMAL HIGH (ref 0–99)
NONHDL: 187.88
TRIGLYCERIDES: 123 mg/dL (ref 0.0–149.0)
Total CHOL/HDL Ratio: 5
VLDL: 24.6 mg/dL (ref 0.0–40.0)

## 2016-06-03 LAB — CBC WITH DIFFERENTIAL/PLATELET
BASOS PCT: 0.6 % (ref 0.0–3.0)
Basophils Absolute: 0 10*3/uL (ref 0.0–0.1)
EOS ABS: 0.1 10*3/uL (ref 0.0–0.7)
Eosinophils Relative: 1.6 % (ref 0.0–5.0)
HEMATOCRIT: 37.9 % — AB (ref 39.0–52.0)
Hemoglobin: 13 g/dL (ref 13.0–17.0)
LYMPHS PCT: 41.1 % (ref 12.0–46.0)
Lymphs Abs: 2 10*3/uL (ref 0.7–4.0)
MCHC: 34.2 g/dL (ref 30.0–36.0)
MCV: 84.7 fl (ref 78.0–100.0)
Monocytes Absolute: 0.3 10*3/uL (ref 0.1–1.0)
Monocytes Relative: 6.8 % (ref 3.0–12.0)
NEUTROS ABS: 2.4 10*3/uL (ref 1.4–7.7)
Neutrophils Relative %: 49.9 % (ref 43.0–77.0)
PLATELETS: 235 10*3/uL (ref 150.0–400.0)
RBC: 4.48 Mil/uL (ref 4.22–5.81)
RDW: 13.9 % (ref 11.5–15.5)
WBC: 4.8 10*3/uL (ref 4.0–10.5)

## 2016-06-03 LAB — COMPREHENSIVE METABOLIC PANEL
ALT: 25 U/L (ref 0–53)
AST: 17 U/L (ref 0–37)
Albumin: 4 g/dL (ref 3.5–5.2)
Alkaline Phosphatase: 89 U/L (ref 39–117)
BUN: 12 mg/dL (ref 6–23)
CALCIUM: 9.2 mg/dL (ref 8.4–10.5)
CHLORIDE: 102 meq/L (ref 96–112)
CO2: 30 meq/L (ref 19–32)
CREATININE: 0.82 mg/dL (ref 0.40–1.50)
GFR: 99.88 mL/min (ref >60.00)
Glucose, Bld: 187 mg/dL — ABNORMAL HIGH (ref 70–99)
Potassium: 4.3 mEq/L (ref 3.5–5.1)
SODIUM: 139 meq/L (ref 135–145)
Total Bilirubin: 0.3 mg/dL (ref 0.2–1.2)
Total Protein: 6.6 g/dL (ref 6.0–8.3)

## 2016-06-03 LAB — PSA: PSA: 0.32 ng/mL (ref 0.10–4.00)

## 2016-06-03 LAB — HM DIABETES FOOT EXAM

## 2016-06-03 LAB — HEMOGLOBIN A1C: HEMOGLOBIN A1C: 8 % — AB (ref 4.6–6.5)

## 2016-06-03 MED ORDER — ESOMEPRAZOLE MAGNESIUM 40 MG PO CPDR: 40.0000 mg | DELAYED_RELEASE_CAPSULE | Freq: Two times a day (BID) | ORAL | 11 refills | Status: DC

## 2016-06-03 MED ORDER — ALPRAZOLAM 0.5 MG PO TABS: 0.5000 mg | ORAL_TABLET | Freq: Three times a day (TID) | ORAL | 0 refills | Status: DC | PRN

## 2016-06-03 NOTE — Assessment & Plan Note (Signed)
Mostly anxiety Rare dysthymia Has complicated sleep regimen which works for him

## 2016-06-03 NOTE — Assessment & Plan Note (Signed)
BP Readings from Last 3 Encounters:  06/03/16 138/84  10/29/15 120/76  07/23/15 138/76   Adequate control

## 2016-06-03 NOTE — Assessment & Plan Note (Signed)
Voice changes and dysphagia if he doesn't take the bid nexium

## 2016-06-03 NOTE — Assessment & Plan Note (Signed)
Healthy but needs to work on lifestyle and weight loss Does walk but not losing weight Discussed PSA---will check (has [place]) Colon due 2019

## 2016-06-03 NOTE — Progress Notes (Signed)
Pre visit review using our clinic review tool, if applicable. No additional management support is needed unless otherwise documented below in the visit note. 

## 2016-06-03 NOTE — Assessment & Plan Note (Signed)
Had been better but now higher again He needs to work on the lifestyle Increase glipizide prn

## 2016-06-03 NOTE — Assessment & Plan Note (Signed)
Doing better at night with the medication

## 2016-06-03 NOTE — Progress Notes (Signed)
Subjective:    Patient ID: Alejandro Koch, male    DOB: 1950/10/11, 66 y.o.   MRN: [id number]  HPI Here for physical Does have Medicare but is secondary to his [place] his form and advanced directives No falls Some down days--dealing with trying to sell business--but no significant issues with mood  Checks sugars every morning ---occasionally checks at night AM sugars up some recently He relates to "discipline) Walks regularly--- 3 miles at a time Some tingling in feet--no sig pain. No sores/ulcers Overdue for eye exam  Still alternates the zolpidem and temazepam Mild mood issues  Insurance denied nexium Has tried omeprazole and it doesn't work Daily is also ineffective for the symptoms Having voice problems due to this May have some dysphagia also--feels irritated  Current Outpatient Prescriptions on File Prior to Visit  Medication Sig Dispense Refill  . ALPRAZolam (XANAX) 0.5 MG tablet TAKE 1 TABLET BY MOUTH 3 TIMES DAILY AS NEEDED 90 tablet 0  . aspirin 81 MG tablet Take 81 mg by mouth daily.    Marland Kitchen atorvastatin (LIPITOR) 20 MG tablet TAKE 1 TABLET (20 MG TOTAL) BY MOUTH DAILY. 90 tablet 2  . Blood Glucose Monitoring Suppl ([place]) w/Device KIT 1 Units by Does not apply route once. 1 kit 0  . BYDUREON 2 MG PEN INJECT 2 MG AS DIRECTED ONCE A WEEK. 4 each 11  . esomeprazole (NEXIUM) 40 MG capsule Take 1 capsule (40 mg total) by mouth 2 (two) times daily before a meal. 60 capsule 11  . finasteride (PROSCAR) 5 MG tablet TAKE 1 TABLET (5 MG TOTAL) BY MOUTH DAILY. 90 tablet 3  . glipiZIDE (GLUCOTROL XL) 5 MG 24 hr tablet Take 1 tablet (5 mg total) by mouth daily with breakfast. 30 tablet 11  . hydrocortisone-pramoxine (ANALPRAM-HC) 2.5-1 % rectal cream Place 1 application rectally 3 (three) times daily as needed. 30 g 3  . ketorolac (ACULAR) 0.5 % ophthalmic solution USE AS DIRECTED 5 mL 1  . olmesartan-hydrochlorothiazide (BENICAR HCT) 20-12.5 MG tablet  Take 1 tablet by mouth daily. 30 tablet 11  . ONETOUCH DELICA LANCETS FINE MISC 1 Units by Does not apply route daily. 100 each 2  . ONETOUCH VERIO test strip USE 1 STRIP DAILY TO CHECK BLOOD SUGAR 100 each 4  . tamsulosin (FLOMAX) 0.4 MG CAPS capsule TAKE 1 CAPSULE (0.4 MG TOTAL) BY MOUTH DAILY AFTER SUPPER. 90 capsule 3  . temazepam (RESTORIL) 30 MG capsule Take 1 capsule (30 mg total) by mouth at bedtime as needed. 30 capsule 0  . zolpidem (AMBIEN) 10 MG tablet TAKE 1 TABLET BY MOUTH AT BEDTIME AS NEEDED 30 tablet 0   No current facility-administered medications on file prior to visit.     Allergies  Allergen Reactions  . Iohexol      Code: HIVES, Desc: PT IS ALLERGIC TO IVP DYE AS NOTED IN PREVIOUS RECORDS IN PACS 02/2009/RM, Onset Date: [id number]   . Atorvastatin Other (See Comments)    Myalgias with this and rosuvastin  . Metformin And Related Diarrhea    Past Medical History:  Diagnosis Date  . Allergy   . BPH (benign prostatic hypertrophy)   . Depression   . Diabetes ([place])   . GERD (gastroesophageal reflux disease)   . Glaucoma   . Hyperlipidemia   . Hypertension   . Kidney stones     Past Surgical History:  Procedure Laterality Date  . COLONOSCOPY  2008  . FINGER SURGERY  2013   infection 2nd finger left hand    Family History  Problem Relation Age of Onset  . Alzheimer's disease Mother   . Heart disease Father   . COPD Father   . Colon cancer Father 98  . Prostate cancer Father   . Leukemia    . Stomach cancer Neg Hx     Social History   Social History  . Marital status: Married    Spouse name: N/A  . Number of children: 1  . 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 Smoking status: Former Smoker    Packs/day: 2.50    Years: 7.00    Types: Cigarettes    Quit date: 03/18/1983  . Smokeless tobacco: Never Used  . Alcohol use No  . Drug use: No  . Sexual  activity: Yes   Other Topics Concern  . Not on file   Social History Narrative   No living will or health care POA thus far   Wife, then son, should make decisions. May make son the primary person   Would accept resuscitation attempts but wouldn't want to be on machines   No tube feeds if cognitively unaware   Review of Systems  Constitutional: Positive for fatigue.       Wears seat belt mostly  HENT: Positive for hearing loss and tinnitus. Negative for dental problem.        Keeps up with dentist  Eyes: Negative for visual disturbance.       No diplopia or unilateral vision loss  Respiratory: Negative for cough, chest tightness and shortness of breath.   Cardiovascular: Positive for palpitations and leg swelling.       Chest pain from GERD--not with exertion Feels PVCs  Gastrointestinal: Negative for blood in stool, constipation and diarrhea.       Has persistent RUQ discomfort from the fatty liver  Endocrine: Negative for polydipsia and polyuria.  Genitourinary:       Stream is weaker--is on the medication for BPH Nocturia is better on the medication  Musculoskeletal: Negative for arthralgias, back pain and joint swelling.  Skin: Negative for rash.       Has spot on back he wants checked--very itchy  Allergic/Immunologic: Negative for environmental allergies and immunocompromised state.  Neurological: Negative for dizziness, syncope, light-headedness and headaches.  Hematological: Negative for adenopathy. Bruises/bleeds easily.  Psychiatric/Behavioral: Positive for sleep disturbance. The patient is nervous/anxious.        Objective:   Physical Exam  Constitutional: He appears well-developed. No distress.  HENT:  Head: Normocephalic and atraumatic.  Right Ear: External ear normal.  Left Ear: External ear normal.  Mouth/Throat: Oropharynx is clear and moist. No oropharyngeal exudate.  Eyes: Conjunctivae are normal. Pupils are equal, round, and reactive to light.  Neck:  No thyromegaly present.  Cardiovascular: Normal rate, regular rhythm, normal heart sounds and intact distal pulses.  Exam reveals no gallop.   No murmur heard. Pulmonary/Chest: Effort normal and breath sounds normal. No respiratory distress. He has no wheezes. He has no rales.  Abdominal: Soft.  Musculoskeletal: He exhibits no edema or tenderness.  Lymphadenopathy:    He has no cervical adenopathy.  Neurological:  Fairly normal sensation in feet  Skin: No rash noted.  No foot ulcers Fleshy lesion on back---reassured  Psychiatric: He has a normal mood and affect. His behavior is normal.  Assessment & Plan:

## 2016-06-05 ENCOUNTER — Telehealth: Payer: Self-pay

## 2016-06-05 NOTE — Telephone Encounter (Signed)
Information filled out on coverMyMeds. Waiting on response within 72 hours

## 2016-06-10 NOTE — Telephone Encounter (Signed)
Received denial. Pt has to try and fail omeprazole, pantoprazole, Aciphex, AND Dexilant before they will cover Nexium.

## 2016-06-10 NOTE — Telephone Encounter (Signed)
Left a message on vm for pt said to let us know if he wanted to try changing meds.

## 2016-06-17 LAB — HM DIABETES EYE EXAM

## 2016-06-25 ENCOUNTER — Telehealth: Payer: Self-pay | Admitting: Internal Medicine

## 2016-06-25 ENCOUNTER — Encounter: Payer: Self-pay | Admitting: Family Medicine

## 2016-06-25 ENCOUNTER — Ambulatory Visit (INDEPENDENT_AMBULATORY_CARE_PROVIDER_SITE_OTHER): Payer: 59 | Admitting: Family Medicine

## 2016-06-25 VITALS — BP 134/90 | HR 119 | Temp 98.5°F | Ht 74.5 in | Wt 290.5 lb

## 2016-06-25 DIAGNOSIS — M542 Cervicalgia: Secondary | ICD-10-CM | POA: Diagnosis not present

## 2016-06-25 MED ORDER — MELOXICAM 15 MG PO TABS: 15.0000 mg | ORAL_TABLET | Freq: Every day | ORAL | 0 refills | Status: DC

## 2016-06-25 MED ORDER — CYCLOBENZAPRINE HCL 10 MG PO TABS: 10.0000 mg | ORAL_TABLET | Freq: Three times a day (TID) | ORAL | 0 refills | Status: DC | PRN
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
## 2016-06-25 NOTE — Telephone Encounter (Signed)
Pt was triaged by [facility] CMA and Dr Glori Bickers will see pt today.

## 2016-06-25 NOTE — Patient Instructions (Signed)
We will call you regarding an urgent physical therapy referral  Use ice 10 minutes and heat 10 minutes whenever you can  Think about getting a foam support pillow  Take flexeril as needed when not working or driving (caution of sedation) meloxicam 15 mg once daily with a meal

## 2016-06-25 NOTE — Telephone Encounter (Signed)
Patient Name: Alejandro Koch  DOB: 1950-10-24    Initial Comment Caller states her husband is having severe pain in his neck. It is making him sick when he turns his neck.    Nurse Assessment  Nurse: Christel Mormon, RN, Levada Dy Date/Time (Eastern Time): 06/25/2016 4:34:45 PM  Confirm and document reason for call. If symptomatic, describe symptoms. ---Pt has history of cervical issues - he states he is walking to the door of the office now. He states 2 nights ago, woke up with posterior neck pain. He states the pain is so bad it is making him sick to his stomach. He states already in the office at the reception desk now. Instructed to call us back if needed.  Does the patient have any new or worsening symptoms? ---Yes  Will a triage be completed? ---No  Select reason for no triage. ---Other  Please document clinical information provided and list any resource used. ---drove to the office prior to our return call     Guidelines    Guideline Title Affirmed Question Affirmed Notes       Final Disposition

## 2016-06-25 NOTE — Progress Notes (Signed)
Pre visit review using our clinic review tool, if applicable. No additional management support is needed unless otherwise documented below in the visit note. 

## 2016-06-26 NOTE — Assessment & Plan Note (Signed)
Torticollis with L cervical muscle spasm  Disc use of heat and ice  Gentle rom stretches  Flexeril up to tid when not working or driving (caution of sedation) meloxicam with meal 15 mg once daily prn short term  Urgent ref to PT eval and tx  No neuro symptoms- doubt spinal stenosis  Will watch for UE weakness or numbness- rev last MRI  Update and f/u if no improvement

## 2016-07-02 LAB — HM DIABETES EYE EXAM

## 2016-07-08 ENCOUNTER — Encounter: Payer: Self-pay | Admitting: Internal Medicine

## 2016-07-09 ENCOUNTER — Other Ambulatory Visit: Payer: Self-pay | Admitting: Family Medicine

## 2016-07-11 ENCOUNTER — Other Ambulatory Visit: Payer: Self-pay | Admitting: Family Medicine

## 2016-07-14 ENCOUNTER — Other Ambulatory Visit: Payer: Self-pay | Admitting: Family Medicine

## 2016-07-14 NOTE — Telephone Encounter (Signed)
Ok, thanks  [place]

## 2016-07-14 NOTE — Telephone Encounter (Signed)
If refill was 4/11 isn't it too early to refill?

## 2016-07-14 NOTE — Telephone Encounter (Signed)
Spoke to [place] at pharmacy and was advised that their system is doing refills differently now. Yong Channel stated that she will deny the script and send refill request in closer to when it is due.

## 2016-07-14 NOTE — Telephone Encounter (Signed)
Last refill 06/25/16 #30, Last office visit same date

## 2016-07-14 NOTE — Telephone Encounter (Signed)
Ok, denied

## 2016-07-17 ENCOUNTER — Other Ambulatory Visit: Payer: Self-pay | Admitting: *Deleted

## 2016-07-17 MED ORDER — MELOXICAM 15 MG PO TABS
15.0000 mg | ORAL_TABLET | Freq: Every day | ORAL | 0 refills | Status: DC
Start: 2016-07-17 — End: 2016-09-10

## 2016-07-17 NOTE — Telephone Encounter (Signed)
Approved: #30 x 0 He should set up follow up if he still needs it

## 2016-07-17 NOTE — Telephone Encounter (Signed)
Dr. Glori Bickers saw pt for an acute neck pain appt on 06/25/16, she gave #30 with 0 refills, will rout refill request to PCP for future refill

## 2016-07-30 ENCOUNTER — Other Ambulatory Visit: Payer: Self-pay | Admitting: Internal Medicine

## 2016-08-14 ENCOUNTER — Other Ambulatory Visit: Payer: Self-pay | Admitting: Internal Medicine

## 2016-08-14 NOTE — Telephone Encounter (Signed)
Approved: okay #90 x 0 for alprazolam and #30 x 0 for zolpidem

## 2016-08-14 NOTE — Telephone Encounter (Signed)
Alprazolam last filled 04-14-16 #90  Zolpidem last filled 03-18-16 #30 No UDS on file  Last OV 06-25-16 Acute Next OV 12-09-16

## 2016-08-14 NOTE — Telephone Encounter (Signed)
Left refills on voice mail at pharmacy  

## 2016-08-15 HISTORY — PX: REPAIR FLEXOR TENDON HAND: SUR1175

## 2016-08-25 ENCOUNTER — Other Ambulatory Visit: Payer: Self-pay | Admitting: Internal Medicine

## 2016-09-07 ENCOUNTER — Emergency Department (HOSPITAL_COMMUNITY): Payer: 59

## 2016-09-07 ENCOUNTER — Emergency Department (HOSPITAL_COMMUNITY)
Admission: EM | Admit: 2016-09-07 | Discharge: 2016-09-08 | Disposition: A | Discharge status: Home / Self Care | Payer: 59 | Attending: Emergency Medicine | Admitting: Emergency Medicine

## 2016-09-07 ENCOUNTER — Encounter (HOSPITAL_COMMUNITY): Payer: Self-pay | Admitting: Emergency Medicine

## 2016-09-07 DIAGNOSIS — I10 Essential (primary) hypertension: Secondary | ICD-10-CM | POA: Insufficient documentation

## 2016-09-07 DIAGNOSIS — Y9389 Activity, other specified: Secondary | ICD-10-CM | POA: Insufficient documentation

## 2016-09-07 DIAGNOSIS — E1149 Type 2 diabetes mellitus with other diabetic neurological complication: Secondary | ICD-10-CM | POA: Insufficient documentation

## 2016-09-07 DIAGNOSIS — Z79899 Other long term (current) drug therapy: Secondary | ICD-10-CM | POA: Insufficient documentation

## 2016-09-07 DIAGNOSIS — IMO0001 Reserved for inherently not codable concepts without codable children: Secondary | ICD-10-CM

## 2016-09-07 DIAGNOSIS — Z87891 Personal history of nicotine dependence: Secondary | ICD-10-CM | POA: Diagnosis not present

## 2016-09-07 DIAGNOSIS — W312XXA Contact with powered woodworking and forming machines, initial encounter: Secondary | ICD-10-CM | POA: Diagnosis not present

## 2016-09-07 DIAGNOSIS — Y9289 Other specified places as the place of occurrence of the external cause: Secondary | ICD-10-CM | POA: Insufficient documentation

## 2016-09-07 DIAGNOSIS — Z23 Encounter for immunization: Secondary | ICD-10-CM | POA: Insufficient documentation

## 2016-09-07 DIAGNOSIS — Z7984 Long term (current) use of oral hypoglycemic drugs: Secondary | ICD-10-CM | POA: Diagnosis not present

## 2016-09-07 DIAGNOSIS — Z7982 Long term (current) use of aspirin: Secondary | ICD-10-CM | POA: Diagnosis not present

## 2016-09-07 DIAGNOSIS — Y999 Unspecified external cause status: Secondary | ICD-10-CM | POA: Insufficient documentation

## 2016-09-07 DIAGNOSIS — S61411A Laceration without foreign body of right hand, initial encounter: Secondary | ICD-10-CM | POA: Diagnosis present

## 2016-09-07 MED ORDER — LIDOCAINE HCL 2 % IJ SOLN
10.0000 mL | Freq: Once | INTRAMUSCULAR | Status: AC
  Administered 2016-09-07: 200 mg
  Filled 2016-09-07: qty 20

## 2016-09-07 MED ORDER — TETANUS-DIPHTH-ACELL PERTUSSIS 5-2.5-18.5 LF-MCG/0.5 IM SUSP
0.5000 mL | Freq: Once | INTRAMUSCULAR | Status: AC
  Administered 2016-09-07: 0.5 mL via INTRAMUSCULAR
  Filled 2016-09-07: qty 0.5

## 2016-09-07 NOTE — ED Provider Notes (Signed)
[facility] DEPT Provider Note    By signing my name below, I, Bea Graff, attest that this documentation has been prepared under the direction and in the presence of Chais Fehringer, PA-C. Electronically Signed: Bea Graff, ED Scribe. 09/07/16. 11:10 PM.    History   Chief Complaint Chief Complaint  Patient presents with  . Extremity Laceration   The history is provided by the patient and medical records. No language interpreter was used.    Alejandro Koch is a 66 y.o. male who presents to the Emergency Department complaining of a laceration to the base of the right thumb that he sustained about 4 hours ago while sawing a piece of metal. He states the piece of metal popped up and lacerated the area. He reports associated bleeding that has since been controlled. He reports limited movement of the thumb and some pain in the forearm with ROM of the thumb. He has not taken anything for pain but has cleaned the wound. He denies modifying factors. He denies numbness, tingling or weakness of the right hand or thumb, fever, chills, nausea, vomiting. Per chart, last tetanus vaccination was 08/2010.    Past Medical History:  Diagnosis Date  . Allergy   . BPH (benign prostatic hypertrophy)   . Depression   . Diabetes ([place])   . GERD (gastroesophageal reflux disease)   . Glaucoma   . Hyperlipidemia   . Hypertension   . Kidney stones     Patient Active Problem List   Diagnosis Date Noted  . Neck pain on left side 06/25/2016  . Laryngopharyngeal reflux (LPR) 06/03/2016  . Anemia 07/23/2015  . Chest pain 05/21/2015  . Preventative health care 05/12/2014  . Actinic keratoses 05/12/2014  . BPH with obstruction/lower urinary tract symptoms   . Fatty liver 03/01/2010  . HYPOGONADISM 09/19/2009  . Type 2 diabetes mellitus with neurological manifestations, uncontrolled ([place]) 03/05/2009  . Hyperlipemia 09/30/2006  . Mood disorder ([place]) 09/30/2006  . Essential hypertension, benign  09/30/2006  . ALLERGIC RHINITIS 09/30/2006  . GERD 09/30/2006  . OSA (obstructive sleep apnea) 09/30/2006    Past Surgical History:  Procedure Laterality Date  . COLONOSCOPY  2008  . FINGER SURGERY  2013   infection 2nd finger left hand       Home Medications    Prior to Admission medications   Medication Sig Start Date End Date Taking? Authorizing Provider  ALPRAZolam Duanne Moron) 0.5 MG tablet TAKE 1 TABLET BY MOUTH 3 TIMES DAILY AS NEEDED 08/14/16   Venia Carbon, MD  aspirin 81 MG tablet Take 81 mg by mouth daily.    [provider]  atorvastatin (LIPITOR) 20 MG tablet TAKE 1 TABLET (20 MG TOTAL) BY MOUTH DAILY. 07/30/16   Venia Carbon, MD  Blood Glucose Monitoring Suppl ([place]) w/Device KIT 1 Units by Does not apply route once. 07/26/15   Venia Carbon, MD  BYDUREON 2 MG PEN INJECT 2 MG AS DIRECTED ONCE A WEEK. 04/07/16   Viviana Simpler I, MD  cyclobenzaprine (FLEXERIL) 10 MG tablet TAKE 1 TABLET BY MOUTH 3 TIMES DAILY AS NEEDED FOR MUSCLE SPASMS WHEN NOT WORKING OR DRIVING 1/61/09   Venia Carbon, MD  esomeprazole (NEXIUM) 40 MG capsule Take 1 capsule (40 mg total) by mouth 2 (two) times daily before a meal. 06/03/16   Venia Carbon, MD  finasteride (PROSCAR) 5 MG tablet TAKE 1 TABLET (5 MG TOTAL) BY MOUTH DAILY. 11/02/15   Venia Carbon,  MD  glipiZIDE (GLUCOTROL XL) 5 MG 24 hr tablet TAKE 1 TABLET (5 MG TOTAL) BY MOUTH DAILY WITH BREAKFAST. 08/25/16   Venia Carbon, MD  hydrocortisone-pramoxine [facility]) 2.5-1 % rectal cream Place 1 application rectally 3 (three) times daily as needed. 07/30/12   Ricard Dillon, MD  ketorolac Nancie Neas) 0.5 % ophthalmic solution USE AS DIRECTED 06/27/13   Ricard Dillon, MD  meloxicam (MOBIC) 15 MG tablet Take 1 tablet (15 mg total) by mouth daily. With a meal for neck pain 07/17/16   Venia Carbon, MD  Meth-Hyo-M Bl-Na Phos-Ph Sal (URO-MP) 118 MG CAPS Take 1 capsule by mouth every 6 (six)  hours as needed. 05/09/16   [provider]  olmesartan-hydrochlorothiazide (BENICAR HCT) 20-12.5 MG tablet Take 1 tablet by mouth daily. 07/23/15   Venia Carbon, MD  [facility] DELICA LANCETS FINE MISC 1 Units by Does not apply route daily. 07/26/15   Venia Carbon, MD  ONETOUCH VERIO test strip USE 1 STRIP DAILY TO CHECK BLOOD SUGAR 03/11/16   Venia Carbon, MD  [facility] VERIO test strip USE 1 STRIP DAILY TO CHECK BLOOD SUGAR 08/14/16   Venia Carbon, MD  tamsulosin (FLOMAX) 0.4 MG CAPS capsule TAKE 1 CAPSULE (0.4 MG TOTAL) BY MOUTH DAILY AFTER SUPPER. 08/25/16   Venia Carbon, MD  temazepam (RESTORIL) 30 MG capsule Take 1 capsule (30 mg total) by mouth at bedtime as needed. 04/07/16   Venia Carbon, MD  zolpidem (AMBIEN) 10 MG tablet TAKE 1 TABLET BY MOUTH AT BEDTIME AS NEEDED 08/14/16   Venia Carbon, MD    Family History Family History  Problem Relation Age of Onset  . Alzheimer's disease Mother   . Heart disease Father   . COPD Father   . Colon cancer Father 42  . Prostate cancer Father   . Leukemia Unknown   . Stomach cancer Neg Hx     Social History Social History  Substance Use Topics  . Smoking status: Former Smoker    Packs/day: 2.50    Years: 7.00    Types: Cigarettes    Quit date: 03/18/1983  . Smokeless tobacco: Never Used  . Alcohol use No     Allergies   Iohexol; Atorvastatin; and Metformin and related   Review of Systems Review of Systems  Constitutional: Negative for activity change, chills and fever.  Respiratory: Negative for shortness of breath.   Cardiovascular: Negative for chest pain.  Gastrointestinal: Negative for abdominal pain, nausea and vomiting.  Musculoskeletal: Negative for back pain.  Skin: Positive for wound. Negative for rash.  Neurological: Positive for numbness. Negative for weakness.   Physical Exam Updated Vital Signs BP (!) 176/83 (BP Location: Left Arm)   Pulse (!) 104   Temp 98.1 F (36.7 C)  (Oral)   Resp 18   SpO2 95%   Physical Exam  Constitutional: He is oriented to person, place, and time. He appears well-developed and well-nourished.  HENT:  Head: Normocephalic and atraumatic.  Neck: Normal range of motion.  Cardiovascular: Normal rate.   Radial pulses 2+ bilaterally.  Pulmonary/Chest: Effort normal.  Musculoskeletal:  Straight, hemostatic 2 cm laceration to the dorsum of the right hand extending from the MCP of the thumb towards the wrist. Good ROM with abduction and adduction and flexion. Decreased ROM with extension and hyperextension. No obvious foreign bodies.  Neurological: He is alert and oriented to person, place, and time.  Sensations intact throughout right hand. Good strength  against resistance with all digits of right hand.  Skin: Skin is warm and dry. Capillary refill takes less than 2 seconds.  Psychiatric: He has a normal mood and affect. His behavior is normal.  Nursing note and vitals reviewed.    ED Treatments / Results  DIAGNOSTIC STUDIES: Oxygen Saturation is 95% on RA, adequate by my interpretation.   COORDINATION OF CARE: 8:16 PM- Will order imaging and then suture wound. Will order tetanus vaccination. Pt verbalizes understanding and agrees to plan.  Medications  Tdap (BOOSTRIX) injection 0.5 mL (0.5 mLs Intramuscular Given 09/07/16 2058)  lidocaine (XYLOCAINE) 2 % (with pres) injection 200 mg (200 mg Infiltration Given 09/07/16 2245)    Labs (all labs ordered are listed, but only abnormal results are displayed) Labs Reviewed - No data to display  EKG  EKG Interpretation None       Radiology Dg Hand Complete Right  Result Date: 09/07/2016 CLINICAL DATA:  Laceration to the posterior right hand EXAM: RIGHT HAND - COMPLETE 3+ VIEW COMPARISON:  08/18/2007 FINDINGS: No fracture or malalignment.  No radiopaque foreign body. IMPRESSION: No acute osseous abnormality Electronically Signed   By: Donavan Foil M.D.   On: 09/07/2016 22:14     Procedures .Marland KitchenLaceration Repair Date/Time: 09/07/2016 10:50 PM Performed by: Sherryle Lis, Aradhana Gin A Authorized by: Joline Maxcy A   Consent:    Consent obtained:  Verbal   Consent given by:  Patient Anesthesia (see MAR for exact dosages):    Anesthesia method:  Local infiltration   Local anesthetic:  Lidocaine 2% w/o epi Laceration details:    Location:  Hand   Hand location:  R hand, dorsum   Length (cm):  2 Repair type:    Repair type:  Simple Pre-procedure details:    Preparation:  Patient was prepped and draped in usual sterile fashion and imaging obtained to evaluate for foreign bodies Exploration:    Hemostasis achieved with:  Direct pressure   Wound exploration: wound explored through full range of motion and entire depth of wound probed and visualized     Wound extent: no foreign bodies/material noted and no underlying fracture noted     Contaminated: no   Treatment:    Area cleansed with:  Betadine   Amount of cleaning:  Extensive   Irrigation solution:  Sterile saline   Irrigation method:  Syringe Skin repair:    Repair method:  Sutures   Suture size:  4-0   Suture material:  Prolene   Suture technique:  Simple interrupted   Number of sutures:  3 Approximation:    Approximation:  Loose   Verm[place] border: well-aligned   Post-procedure details:    Dressing:  Splint for protection   Patient tolerance of procedure:  Tolerated well, no immediate complications    (including critical care time)  Medications Ordered in ED Medications  Tdap (BOOSTRIX) injection 0.5 mL (0.5 mLs Intramuscular Given 09/07/16 2058)  lidocaine (XYLOCAINE) 2 % (with pres) injection 200 mg (200 mg Infiltration Given 09/07/16 2245)     Initial Impression / Assessment and Plan / ED Course  I have reviewed the triage vital signs and the nursing notes.  Pertinent labs & imaging results that were available during my care of the patient were reviewed by me and considered in my medical  decision making (see chart for details).     Tetanus updated in ED. Laceration occurred < 12 hours prior to repair. Wound loosely approximated. Discussed laceration care with pt and answered questions. Pt  to f-u for suture removal in 7-10 days and wound check sooner should there be signs of dehiscence or infection. Advised pt to follow up with his hand specialist, Dr. Caralyn Guile in the next few days given questionable tendon involvement. Splint applied prior to d/c in the patient is neurovascularly intact following splint application. Pt is hemodynamically stable with no complaints prior to dc.    Final Clinical Impressions(s) / ED Diagnoses   Final diagnoses:  Laceration of right hand without foreign body, initial encounter    New Prescriptions Discharge Medication List as of 09/07/2016 11:56 PM      I personally performed the services described in this documentation, which was scribed in my presence. The recorded information has been reviewed and is accurate.    Joanne Gavel, PA-C 09/08/16 0355    Carmin Muskrat, MD 09/09/16 [phone]

## 2016-09-07 NOTE — ED Triage Notes (Signed)
Pt presents to ED for assessment of 3 cm laceration to the right hand at the base of the thumb.  Pt c/o decreased motion, sensation intact.

## 2016-09-07 NOTE — ED Notes (Signed)
Pt verbalized understanding discharge instructions and denies any further needs or questions at this time. VS stable, ambulatory and steady gait.   

## 2016-09-07 NOTE — Discharge Instructions (Signed)
Please call Dr. Angus Palms office tomorrow morning to schedule a follow-up appointment. Please keep the splint in place until you evaluated by him. If he develop any worsening numbness, tingling, or if the extremity started to feel cold, please return to the emergency department for reevaluation.

## 2016-09-08 ENCOUNTER — Telehealth: Payer: Self-pay

## 2016-09-08 NOTE — Telephone Encounter (Signed)
Spoke to pt. Has an appt with hand specialist tomorrow.

## 2016-09-10 ENCOUNTER — Other Ambulatory Visit: Payer: Self-pay | Admitting: Internal Medicine

## 2016-09-16 DIAGNOSIS — S61401A Unspecified open wound of right hand, initial encounter: Secondary | ICD-10-CM

## 2016-09-16 HISTORY — DX: Unspecified open wound of right hand, initial encounter: S61.401A

## 2016-10-10 ENCOUNTER — Other Ambulatory Visit: Payer: Self-pay | Admitting: Internal Medicine

## 2016-10-11 ENCOUNTER — Other Ambulatory Visit: Payer: Self-pay | Admitting: Internal Medicine

## 2016-10-13 NOTE — Telephone Encounter (Signed)
Both last filled 08-14-16 Alprazolam #90 and Zolpidem #30. Last OV 06-03-16 Next OV 12-09-16

## 2016-10-13 NOTE — Telephone Encounter (Signed)
Approved:okay same amounts of each x 0  

## 2016-10-13 NOTE — Telephone Encounter (Signed)
Left refill on voice mail at pharmacy  

## 2016-10-21 ENCOUNTER — Other Ambulatory Visit: Payer: Self-pay | Admitting: Internal Medicine

## 2016-11-17 ENCOUNTER — Other Ambulatory Visit: Payer: Self-pay | Admitting: Internal Medicine

## 2016-11-18 NOTE — Telephone Encounter (Signed)
Approved: #90 x 0 for alprazolam and #30 x 0 for zolpidem

## 2016-11-18 NOTE — Telephone Encounter (Signed)
Alprazolam last filled 08-14-16 #90 Zolpidem last filled 10-13-16 #30  Last OV 06-25-16 Next OV 12-09-16

## 2016-11-18 NOTE — Telephone Encounter (Signed)
Left refills on voice mail at pharmacy  

## 2016-12-05 ENCOUNTER — Ambulatory Visit: Payer: Medicare Other | Admitting: Internal Medicine

## 2016-12-09 ENCOUNTER — Ambulatory Visit (INDEPENDENT_AMBULATORY_CARE_PROVIDER_SITE_OTHER): Payer: 59 | Admitting: Internal Medicine

## 2016-12-09 ENCOUNTER — Encounter: Payer: Self-pay | Admitting: Internal Medicine

## 2016-12-09 VITALS — BP 136/82 | HR 93 | Temp 97.6°F | Wt 284.2 lb

## 2016-12-09 DIAGNOSIS — K219 Gastro-esophageal reflux disease without esophagitis: Secondary | ICD-10-CM

## 2016-12-09 DIAGNOSIS — E1165 Type 2 diabetes mellitus with hyperglycemia: Secondary | ICD-10-CM | POA: Diagnosis not present

## 2016-12-09 DIAGNOSIS — E785 Hyperlipidemia, unspecified: Secondary | ICD-10-CM | POA: Diagnosis not present

## 2016-12-09 DIAGNOSIS — E1140 Type 2 diabetes mellitus with diabetic neuropathy, unspecified: Secondary | ICD-10-CM | POA: Diagnosis not present

## 2016-12-09 DIAGNOSIS — F39 Unspecified mood [affective] disorder: Secondary | ICD-10-CM | POA: Diagnosis not present

## 2016-12-09 DIAGNOSIS — Z23 Encounter for immunization: Secondary | ICD-10-CM | POA: Diagnosis not present

## 2016-12-09 DIAGNOSIS — IMO0002 Reserved for concepts with insufficient information to code with codable children: Secondary | ICD-10-CM

## 2016-12-09 LAB — HEMOGLOBIN A1C: Hgb A1c MFr Bld: 8.4 % — ABNORMAL HIGH (ref 4.6–6.5)

## 2016-12-09 MED ORDER — PANTOPRAZOLE SODIUM 40 MG PO TBEC: 40.0000 mg | DELAYED_RELEASE_TABLET | Freq: Two times a day (BID) | ORAL | 3 refills | Status: DC

## 2016-12-09 NOTE — Assessment & Plan Note (Signed)
Not compliant with healthy lifestyle Counseled Eats out all the time--discussed

## 2016-12-09 NOTE — Addendum Note (Signed)
Addended by: Brenton Grills on: 11/08/1751 01:04 AM   Modules accepted: Orders

## 2016-12-09 NOTE — Assessment & Plan Note (Signed)
And sleep disorder Will stop the temazepam

## 2016-12-09 NOTE — Progress Notes (Signed)
Subjective:    Patient ID: Alejandro Koch, male    DOB: [birth year], 66 y.o.   MRN: [id number]  HPI Here for follow up of diabetes and other chronic medical conditions  Still having trouble with the LPR Omeprazole didn't work---"still suffering" Did fine on the nexium which is not covered Never tried protonix  Lost some weight--then gained it back Runs 160-200 fasting---takes it most days No hypoglycemic reactions No pain in feet---does have numbness there  Insurance will not cover temazepam and zolpidem at the same time Also uses the alprazolam at night Still only sleeps 4 hours  Voids better with the prostate med Still has nocturia  Current Outpatient Prescriptions on File Prior to Visit  Medication Sig Dispense Refill  . ALPRAZolam (XANAX) 0.5 MG tablet TAKE 1 TABLET BY MOUTH THREE TIMES A DAY AS NEEDED 90 tablet 0  . aspirin 81 MG tablet Take 81 mg by mouth daily.    Marland Kitchen atorvastatin (LIPITOR) 20 MG tablet TAKE 1 TABLET (20 MG TOTAL) BY MOUTH DAILY. 90 tablet 3  . Blood Glucose Monitoring Suppl ([place]) w/Device KIT 1 Units by Does not apply route once. 1 kit 0  . BYDUREON 2 MG PEN INJECT 2 MG AS DIRECTED ONCE A WEEK. 4 each 11  . cyclobenzaprine (FLEXERIL) 10 MG tablet TAKE 1 TABLET BY MOUTH 3 TIMES DAILY AS NEEDED FOR MUSCLE SPASMS WHEN NOT WORKING OR DRIVING 30 tablet 0  . finasteride (PROSCAR) 5 MG tablet TAKE 1 TABLET (5 MG TOTAL) BY MOUTH DAILY. 90 tablet 3  . glipiZIDE (GLUCOTROL XL) 5 MG 24 hr tablet TAKE 1 TABLET (5 MG TOTAL) BY MOUTH DAILY WITH BREAKFAST. 30 tablet 11  . hydrocortisone-pramoxine (ANALPRAM-HC) 2.5-1 % rectal cream Place 1 application rectally 3 (three) times daily as needed. 30 g 3  . ketorolac (ACULAR) 0.5 % ophthalmic solution USE AS DIRECTED 5 mL 1  . meloxicam (MOBIC) 15 MG tablet TAKE 1 TABLET (15 MG TOTAL) BY MOUTH DAILY. WITH A MEAL FOR NECK PAIN 30 tablet 2  . Meth-Hyo-M Bl-Na Phos-Ph Sal (URO-MP) 118 MG CAPS Take 1 capsule  by mouth every 6 (six) hours as needed.    Marland Kitchen olmesartan-hydrochlorothiazide (BENICAR HCT) 20-12.5 MG tablet Take 1 tablet by mouth daily. 30 tablet 11  . ONETOUCH DELICA LANCETS FINE MISC 1 Units by Does not apply route daily. 100 each 2  . ONETOUCH VERIO test strip USE 1 STRIP DAILY TO CHECK BLOOD SUGAR 100 each 4  . tamsulosin (FLOMAX) 0.4 MG CAPS capsule TAKE 1 CAPSULE (0.4 MG TOTAL) BY MOUTH DAILY AFTER SUPPER. 90 capsule 3  . temazepam (RESTORIL) 30 MG capsule Take 1 capsule (30 mg total) by mouth at bedtime as needed. 30 capsule 0  . zolpidem (AMBIEN) 10 MG tablet TAKE 1 TABLET BY MOUTH AT BEDTIME AS NEEDED 30 tablet 0   No current facility-administered medications on file prior to visit.     Allergies  Allergen Reactions  . Iohexol      Code: HIVES, Desc: PT IS ALLERGIC TO IVP DYE AS NOTED IN PREVIOUS RECORDS IN PACS 02/2009/RM, Onset Date: [id number]   . Atorvastatin Other (See Comments)    Myalgias with this and rosuvastin  . Metformin And Related Diarrhea    Past Medical History:  Diagnosis Date  . Allergy   . BPH (benign prostatic hypertrophy)   . Depression   . Diabetes ([place])   . GERD (gastroesophageal reflux disease)   . Glaucoma   .  Hyperlipidemia   . Hypertension   . Kidney stones     Past Surgical History:  Procedure Laterality Date  . COLONOSCOPY  2008  . FINGER SURGERY  2013   infection 2nd finger left hand    Family History  Problem Relation Age of Onset  . Alzheimer's disease Mother   . Heart disease Father   . COPD Father   . Colon cancer Father 74  . Prostate cancer Father   . Leukemia Unknown   . Stomach cancer Neg Hx     Social History   Social History  . Marital status: Married    Spouse name: N/A  . Number of children: 1  . 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 Smoking status: Former Smoker    Packs/day: 2.50    Years: 7.00    Types:  Cigarettes    Quit date: 03/18/1983  . Smokeless tobacco: Never Used  . Alcohol use No  . Drug use: No  . Sexual activity: Yes   Other Topics Concern  . Not on file   Social History Narrative   No living will or health care POA thus far   Wife, then son, should make decisions. May make son the primary person   Would accept resuscitation attempts but wouldn't want to be on machines   No tube feeds if cognitively unaware   Review of Systems  Stuck piece of metal in hand Needed surgery on right--- at outpatient surgical center     Objective:   Physical Exam  Constitutional: No distress.  Neck: No thyromegaly present.  Cardiovascular: Normal rate, regular rhythm, normal heart sounds and intact distal pulses.  Exam reveals no gallop.   No murmur heard. Pulmonary/Chest: Effort normal and breath sounds normal. No respiratory distress. He has no wheezes. He has no rales.  Musculoskeletal: He exhibits no edema.  Lymphadenopathy:    He has no cervical adenopathy.  Skin:  No foot lesions          Assessment & Plan:

## 2016-12-09 NOTE — Assessment & Plan Note (Signed)
Intolerant of multiple statins 

## 2016-12-09 NOTE — Assessment & Plan Note (Signed)
Controlled with nexium but insurance won't cover Will try protonix instead May need to go to GI

## 2017-01-19 ENCOUNTER — Other Ambulatory Visit: Payer: Self-pay | Admitting: Internal Medicine

## 2017-02-03 ENCOUNTER — Other Ambulatory Visit: Payer: Self-pay | Admitting: Internal Medicine

## 2017-02-04 MED ORDER — ALPRAZOLAM 0.5 MG PO TABS: 0.5000 mg | ORAL_TABLET | Freq: Three times a day (TID) | ORAL | 0 refills | Status: DC | PRN

## 2017-02-04 MED ORDER — ONETOUCH VERIO FLEX SYSTEM W/DEVICE KIT
1.0000 [IU] | PACK | Freq: Once | 0 refills | Status: DC
Start: 2017-02-04 — End: 2017-05-02

## 2017-02-04 MED ORDER — ZOLPIDEM TARTRATE 10 MG PO TABS: 10.0000 mg | ORAL_TABLET | Freq: Every evening | ORAL | 0 refills | Status: DC | PRN

## 2017-02-04 NOTE — Telephone Encounter (Signed)
Left refill on voice mail at pharmacy  

## 2017-02-04 NOTE — Telephone Encounter (Signed)
Ok to refill xanax #90, 0 ref  ambien #30, 0 ref

## 2017-02-04 NOTE — Telephone Encounter (Signed)
Last Rx 11/2016 for xanax and ambien. Last OV 12/09/2016

## 2017-02-05 ENCOUNTER — Telehealth: Payer: Self-pay | Admitting: Internal Medicine

## 2017-02-05 DIAGNOSIS — Z1159 Encounter for screening for other viral diseases: Secondary | ICD-10-CM

## 2017-02-05 NOTE — Telephone Encounter (Signed)
Copied from [place] [id number]. Topic: Appointment Scheduling - Scheduling Inquiry for Clinic >> Feb 04, 2017 11:25 AM Hewitt Shorts wrote: Reason for CRM: pt is wanting to get a hep c shot and according to One note we need to send CRM first so clinic can check timing  >> Feb 04, 2017 11:44 AM Modena Nunnery, CMA wrote: Spoke to pt who states he is wanting screening for HepC, not an immunization. Pt request orders be placed and he be contacted to schedule lab appt afterwards   Order written Please set him up for lab appt

## 2017-03-03 ENCOUNTER — Other Ambulatory Visit: Payer: Self-pay | Admitting: Internal Medicine

## 2017-03-12 ENCOUNTER — Other Ambulatory Visit: Payer: Self-pay | Admitting: Internal Medicine

## 2017-03-13 ENCOUNTER — Other Ambulatory Visit: Payer: Self-pay | Admitting: Internal Medicine

## 2017-03-13 DIAGNOSIS — Z1159 Encounter for screening for other viral diseases: Secondary | ICD-10-CM

## 2017-03-16 ENCOUNTER — Other Ambulatory Visit (INDEPENDENT_AMBULATORY_CARE_PROVIDER_SITE_OTHER): Payer: 59

## 2017-03-16 DIAGNOSIS — Z1159 Encounter for screening for other viral diseases: Secondary | ICD-10-CM

## 2017-03-17 LAB — HEPATITIS C ANTIBODY
Hepatitis C Ab: NONREACTIVE
SIGNAL TO CUT-OFF: 0.01 (ref <1.00)

## 2017-03-24 ENCOUNTER — Other Ambulatory Visit: Payer: Self-pay | Admitting: Internal Medicine

## 2017-03-25 NOTE — Telephone Encounter (Signed)
rx called in to requested pharmacy 

## 2017-03-25 NOTE — Telephone Encounter (Signed)
Approved: okay #30 x 0 for zolpidem and #90 x 0 for alprazolam

## 2017-03-25 NOTE — Telephone Encounter (Signed)
Refills requested for zolpidem (last refilled# 30 on 02/04/17) and alprazolam (last refilled # 90 on 02/04/17); pt last seen 12/09/16.

## 2017-04-08 ENCOUNTER — Other Ambulatory Visit: Payer: Self-pay | Admitting: Internal Medicine

## 2017-04-17 ENCOUNTER — Other Ambulatory Visit: Payer: Self-pay | Admitting: Internal Medicine

## 2017-05-02 ENCOUNTER — Other Ambulatory Visit: Payer: Self-pay | Admitting: Internal Medicine

## 2017-05-04 NOTE — Telephone Encounter (Signed)
Both last filled 03-25-17 Zolpidem #30 Alprazolam #90  Last OV 12-09-16 Next OV 06-16-17

## 2017-05-23 ENCOUNTER — Encounter: Payer: Self-pay | Admitting: Internal Medicine

## 2017-06-07 ENCOUNTER — Other Ambulatory Visit: Payer: Self-pay | Admitting: Internal Medicine

## 2017-06-08 NOTE — Telephone Encounter (Signed)
Both last filled 05-04-17 Last OV 12-09-16 Next OV 06-16-17

## 2017-06-09 ENCOUNTER — Ambulatory Visit: Payer: Managed Care, Other (non HMO) | Admitting: Family Medicine

## 2017-06-09 ENCOUNTER — Encounter: Payer: Self-pay | Admitting: Family Medicine

## 2017-06-09 ENCOUNTER — Ambulatory Visit: Payer: 59 | Admitting: Family Medicine

## 2017-06-09 VITALS — BP 118/66 | HR 106 | Temp 97.6°F | Wt 281.8 lb

## 2017-06-09 DIAGNOSIS — N138 Other obstructive and reflux uropathy: Secondary | ICD-10-CM

## 2017-06-09 DIAGNOSIS — E1149 Type 2 diabetes mellitus with other diabetic neurological complication: Secondary | ICD-10-CM

## 2017-06-09 DIAGNOSIS — E291 Testicular hypofunction: Secondary | ICD-10-CM | POA: Diagnosis not present

## 2017-06-09 DIAGNOSIS — R5383 Other fatigue: Secondary | ICD-10-CM

## 2017-06-09 DIAGNOSIS — R1013 Epigastric pain: Secondary | ICD-10-CM | POA: Diagnosis not present

## 2017-06-09 DIAGNOSIS — I10 Essential (primary) hypertension: Secondary | ICD-10-CM

## 2017-06-09 DIAGNOSIS — N419 Inflammatory disease of prostate, unspecified: Secondary | ICD-10-CM

## 2017-06-09 DIAGNOSIS — K219 Gastro-esophageal reflux disease without esophagitis: Secondary | ICD-10-CM

## 2017-06-09 DIAGNOSIS — E785 Hyperlipidemia, unspecified: Secondary | ICD-10-CM | POA: Diagnosis not present

## 2017-06-09 DIAGNOSIS — IMO0002 Reserved for concepts with insufficient information to code with codable children: Secondary | ICD-10-CM

## 2017-06-09 DIAGNOSIS — N401 Enlarged prostate with lower urinary tract symptoms: Secondary | ICD-10-CM | POA: Diagnosis not present

## 2017-06-09 DIAGNOSIS — E1165 Type 2 diabetes mellitus with hyperglycemia: Secondary | ICD-10-CM

## 2017-06-09 DIAGNOSIS — K760 Fatty (change of) liver, not elsewhere classified: Secondary | ICD-10-CM

## 2017-06-09 LAB — CBC WITH DIFFERENTIAL/PLATELET
BASOS PCT: 0.4 % (ref 0.0–3.0)
Basophils Absolute: 0 10*3/uL (ref 0.0–0.1)
EOS ABS: 0.1 10*3/uL (ref 0.0–0.7)
EOS PCT: 1.3 % (ref 0.0–5.0)
HCT: 38.2 % — ABNORMAL LOW (ref 39.0–52.0)
Hemoglobin: 13.1 g/dL (ref 13.0–17.0)
LYMPHS ABS: 2.8 10*3/uL (ref 0.7–4.0)
Lymphocytes Relative: 44.6 % (ref 12.0–46.0)
MCHC: 34.3 g/dL (ref 30.0–36.0)
MCV: 85 fl (ref 78.0–100.0)
MONO ABS: 0.4 10*3/uL (ref 0.1–1.0)
Monocytes Relative: 5.8 % (ref 3.0–12.0)
NEUTROS PCT: 47.9 % (ref 43.0–77.0)
Neutro Abs: 3 10*3/uL (ref 1.4–7.7)
Platelets: 228 10*3/uL (ref 150.0–400.0)
RBC: 4.5 Mil/uL (ref 4.22–5.81)
RDW: 13.9 % (ref 11.5–15.5)
WBC: 6.2 10*3/uL (ref 4.0–10.5)

## 2017-06-09 LAB — POC URINALSYSI DIPSTICK (AUTOMATED)
BILIRUBIN UA: NEGATIVE
Ketones, UA: NEGATIVE
Leukocytes, UA: NEGATIVE
Nitrite, UA: NEGATIVE
Protein, UA: NEGATIVE
RBC UA: NEGATIVE
SPEC GRAV UA: 1.025 (ref 1.010–1.025)
Urobilinogen, UA: 0.2 E.U./dL
pH, UA: 6 (ref 5.0–8.0)

## 2017-06-09 LAB — HEMOGLOBIN A1C: Hgb A1c MFr Bld: 8.8 % — ABNORMAL HIGH (ref 4.6–6.5)

## 2017-06-09 LAB — COMPREHENSIVE METABOLIC PANEL
ALBUMIN: 3.9 g/dL (ref 3.5–5.2)
ALK PHOS: 87 U/L (ref 39–117)
ALT: 25 U/L (ref 0–53)
AST: 15 U/L (ref 0–37)
BILIRUBIN TOTAL: 0.5 mg/dL (ref 0.2–1.2)
BUN: 17 mg/dL (ref 6–23)
CO2: 31 mEq/L (ref 19–32)
Calcium: 9.5 mg/dL (ref 8.4–10.5)
Chloride: 99 mEq/L (ref 96–112)
Creatinine, Ser: 0.91 mg/dL (ref 0.40–1.50)
GFR: 88.3 mL/min (ref >60.00)
Glucose, Bld: 235 mg/dL — ABNORMAL HIGH (ref 70–99)
POTASSIUM: 4.5 meq/L (ref 3.5–5.1)
Sodium: 136 mEq/L (ref 135–145)
TOTAL PROTEIN: 6.7 g/dL (ref 6.0–8.3)

## 2017-06-09 LAB — TSH: TSH: 2.24 u[IU]/mL (ref 0.35–4.50)

## 2017-06-09 LAB — LDL CHOLESTEROL, DIRECT: LDL DIRECT: 160 mg/dL

## 2017-06-09 LAB — VITAMIN B12: VITAMIN B 12: 196 pg/mL — AB (ref 211–911)

## 2017-06-09 LAB — PSA: PSA: 0.35 ng/mL (ref 0.10–4.00)

## 2017-06-09 MED ORDER — ESOMEPRAZOLE MAGNESIUM 40 MG PO CPDR: 40.0000 mg | DELAYED_RELEASE_CAPSULE | Freq: Every day | ORAL | 1 refills | Status: DC

## 2017-06-09 NOTE — Progress Notes (Signed)
BP 118/66   Pulse (!) 106   Temp 97.6 F (36.4 C) (Oral)   Wt 281 lb 12 oz (127.8 kg)   SpO2 96%   BMI 35.69 kg/m    CC: fatigue Subjective:    Patient ID: Alejandro Koch, male    DOB: 01/06/1951, 67 y.o.   MRN: [id number]  HPI: Alejandro Koch is a 67 y.o. male presenting on 06/09/2017 for Fatigue (loss of energy)   15 min late to appt, he had just found out about scheduled appt through email this morning.   1 mo h/o epigastric pain associated with fatigue described as pressure/tightness occasionally associated with dyspnea (at rest). He actually endorses loss of energy since the holidays. Feels his voice is losing strength, with some hoarseness. Leg strength and size decreased over the last 6 months. Constant RUQ pain over years - has been told has fatty liver - pressure on side actually relieves pain. He is "used to this". Endorses some GERD, indigestion, dysphagia to thick solids (burger, steak). He is on protonix 14m bid - this is not as effective as nexium was - requests return to nexium. Previous change dictated by formulary change  Chronic dysuria attributed to BPH with LUTS - flomax, proscar and URO medicine helpful. Endorses some fullness at rectal area associated with discomfort - h/o prostatitis in the past that feels similar.   Denies fevers/chills, night sweats, unexpected weight loss, early satiety, nausea/vomiting, diarrhea or constipation or blood in stool, cough or palpitations or dizziness. No boring pain to the back.  Some anhedonia. More lethargic than normal, less energy and less motivation to do things.  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   Slowed weight loss noted (9 lbs over last 6 months).  Known diabetic - endorses not following diabetic diet. On bydureon weekly, glipizide XL daily.  Known HTN - he was off benicar hct and BP  remained largely well controlled until recently it has started creeping up, he has restarted 1 tablet daily with better control. Continues meloxicam 113mdaily.  HLD - compliant with lipitor 2060maily.  Not fasting today - had some oatmeal this morning.   Colonoscopy 2008 - due to reschedule.   Relevant past medical, surgical, family and social history reviewed and updated as indicated. Interim medical history since our last visit reviewed. Allergies and medications reviewed and updated. Outpatient Medications Prior to Visit  Medication Sig Dispense Refill  . ALPRAZolam (XANAX) 0.5 MG tablet TAKE 1 TABLET BY MOUTH THREE TIMES A DAY AS NEEDED 90 tablet 0  . aspirin 81 MG tablet Take 81 mg by mouth daily.    . aMarland Kitchenorvastatin (LIPITOR) 20 MG tablet TAKE 1 TABLET (20 MG TOTAL) BY MOUTH DAILY. 90 tablet 3  . Blood Glucose Monitoring Suppl (ONETOUCH VERIO FLEX SYSTEM) w/Device KIT USE TO CHECK BLOOD SUGAR 1 kit 0  . BYDUREON 2 MG PEN INJECT 2 MG AS DIRECTED ONCE A WEEK. 4 each 11  . cyclobenzaprine (FLEXERIL) 10 MG tablet TAKE 1 TABLET BY MOUTH 3 TIMES DAILY AS NEEDED FOR MUSCLE SPASMS WHEN NOT WORKING OR DRIVING 30 tablet 0  . finasteride (PROSCAR) 5 MG tablet TAKE 1 TABLET (5 MG TOTAL) BY MOUTH DAILY. 90 tablet 3  . glipiZIDE (GLUCOTROL XL) 5 MG 24 hr tablet TAKE 1 TABLET (5 MG TOTAL) BY  MOUTH DAILY WITH BREAKFAST. 30 tablet 11  . hydrocortisone-pramoxine (ANALPRAM-HC) 2.5-1 % rectal cream Place 1 application rectally 3 (three) times daily as needed. 30 g 3  . ketorolac (ACULAR) 0.5 % ophthalmic solution USE AS DIRECTED 5 mL 1  . meloxicam (MOBIC) 15 MG tablet TAKE 1 TABLET (15 MG TOTAL) BY MOUTH DAILY. WITH A MEAL FOR NECK PAIN 30 tablet 2  . Meth-Hyo-M Bl-Na Phos-Ph Sal (URO-MP) 118 MG CAPS Take 1 capsule by mouth every 6 (six) hours as needed.    Marland Kitchen olmesartan-hydrochlorothiazide (BENICAR HCT) 20-12.5 MG tablet Take 1 tablet by mouth daily. 30 tablet 11  . ONETOUCH DELICA LANCETS FINE MISC 1  Units by Does not apply route daily. 100 each 2  . ONETOUCH VERIO test strip USE 1 STRIP DAILY TO CHECK BLOOD SUGAR 100 each 3  . pantoprazole (PROTONIX) 40 MG tablet Take 1 tablet (40 mg total) by mouth 2 (two) times daily. 180 tablet 3  . tamsulosin (FLOMAX) 0.4 MG CAPS capsule TAKE 1 CAPSULE (0.4 MG TOTAL) BY MOUTH DAILY AFTER SUPPER. 90 capsule 3  . zolpidem (AMBIEN) 10 MG tablet TAKE ONE TABLET BY MOUTH AT BEDTIME AS NEEDED 30 tablet 0   No facility-administered medications prior to visit.      Per HPI unless specifically indicated in ROS section below Review of Systems     Objective:    BP 118/66   Pulse (!) 106   Temp 97.6 F (36.4 C) (Oral)   Wt 281 lb 12 oz (127.8 kg)   SpO2 96%   BMI 35.69 kg/m   Wt Readings from Last 3 Encounters:  06/09/17 281 lb 12 oz (127.8 kg)  12/09/16 284 lb 4 oz (128.9 kg)  06/25/16 290 lb 8 oz (131.8 kg)    Physical Exam  Constitutional: He appears well-developed and well-nourished. No distress.  HENT:  Mouth/Throat: Oropharynx is clear and moist. No oropharyngeal exudate.  Eyes: Pupils are equal, round, and reactive to light. Conjunctivae are normal.  Neck: Carotid bruit is not present.  Cardiovascular: Regular rhythm, normal heart sounds and intact distal pulses. Tachycardia present.  No murmur heard. Pulmonary/Chest: Effort normal and breath sounds normal. No respiratory distress. He has no wheezes. He has no rales.  Abdominal: Soft. Normal appearance and bowel sounds are normal. He exhibits no distension and no mass. There is tenderness (moderate to palpation) in the right upper quadrant. There is no rigidity, no rebound and no guarding.  Genitourinary: Rectum normal. Rectal exam shows no external hemorrhoid, no internal hemorrhoid, no fissure, no mass, no tenderness and anal tone normal. Prostate is tender (boggy tender prostate). Prostate is not enlarged.  Musculoskeletal: He exhibits no edema.  Skin: Skin is warm and dry. No rash  noted.  Psychiatric: He has a normal mood and affect.  Nursing note and vitals reviewed.  Results for orders placed or performed in visit on 06/09/17  Comprehensive metabolic panel  Result Value Ref Range   Sodium 136 135 - 145 mEq/L   Potassium 4.5 3.5 - 5.1 mEq/L   Chloride 99 96 - 112 mEq/L   CO2 31 19 - 32 mEq/L   Glucose, Bld 235 (H) 70 - 99 mg/dL   BUN 17 6 - 23 mg/dL   Creatinine, Ser 0.91 0.40 - 1.50 mg/dL   Total Bilirubin 0.5 0.2 - 1.2 mg/dL   Alkaline Phosphatase 87 39 - 117 U/L   AST 15 0 - 37 U/L   ALT 25 0 - 53 U/L  Total Protein 6.7 6.0 - 8.3 g/dL   Albumin 3.9 3.5 - 5.2 g/dL   Calcium 9.5 8.4 - 10.5 mg/dL   GFR 88.30 >60.00 mL/min  TSH  Result Value Ref Range   TSH 2.24 0.35 - 4.50 uIU/mL  Hemoglobin A1c  Result Value Ref Range   Hgb A1c MFr Bld 8.8 (H) 4.6 - 6.5 %  PSA  Result Value Ref Range   PSA 0.35 0.10 - 4.00 ng/mL  CBC with Differential/Platelet  Result Value Ref Range   WBC 6.2 4.0 - 10.5 K/uL   RBC 4.50 4.22 - 5.81 Mil/uL   Hemoglobin 13.1 13.0 - 17.0 g/dL   HCT 38.2 (L) 39.0 - 52.0 %   MCV 85.0 78.0 - 100.0 fl   MCHC 34.3 30.0 - 36.0 g/dL   RDW 13.9 11.5 - 15.5 %   Platelets 228.0 150.0 - 400.0 K/uL   Neutrophils Relative % 47.9 43.0 - 77.0 %   Lymphocytes Relative 44.6 12.0 - 46.0 %   Monocytes Relative 5.8 3.0 - 12.0 %   Eosinophils Relative 1.3 0.0 - 5.0 %   Basophils Relative 0.4 0.0 - 3.0 %   Neutro Abs 3.0 1.4 - 7.7 K/uL   Lymphs Abs 2.8 0.7 - 4.0 K/uL   Monocytes Absolute 0.4 0.1 - 1.0 K/uL   Eosinophils Absolute 0.1 0.0 - 0.7 K/uL   Basophils Absolute 0.0 0.0 - 0.1 K/uL  LDL Cholesterol, Direct  Result Value Ref Range   Direct LDL 160.0 mg/dL  Vitamin B12  Result Value Ref Range   Vitamin B-12 196 (L) 211 - 911 pg/mL  POCT Urinalysis Dipstick (Automated)  Result Value Ref Range   Color, UA yellow    Clarity, UA clear    Glucose, UA 2+    Bilirubin, UA neg    Ketones, UA neg    Spec Grav, UA 1.025 1.010 - 1.025    Blood, UA neg    pH, UA 6.0 5.0 - 8.0   Protein, UA neg    Urobilinogen, UA 0.2 0.2 or 1.0 E.U./dL   Nitrite, UA neg    Leukocytes, UA Negative Negative   EKG - sinus tachycardia 100, normal axis, intervals, no acute ST/Tchanges, poor R wave progression septally.     Assessment & Plan:  Non-fasting labs done today. I did ask him to keep f/u CPE with PCP next month.  Problem List Items Addressed This Visit    Abdominal discomfort, epigastric    Epigastric discomfort with some substernal discomfort endorsed - checked EKG today.  He endorses chronic RUQ discomfort without recent imaging. Check CBC, CMP, low threshold to obtain liver imaging.       BPH with obstruction/lower urinary tract symptoms    Ongoing despite 3 drug regimen. See below for possible prostatitis.       Relevant Orders   PSA (Completed)   POCT Urinalysis Dipstick (Automated) (Completed)   Essential hypertension, benign    Back on benicar HCT and bp well managed.       Fatigue - Primary    Several nonspecific symptoms - will check labwork for reversible causes of fatigue including B12 level, CBC, TSH. Advised he keep scheduled f/u with PCP next month for annual physical.       Relevant Orders   TSH (Completed)   CBC with Differential/Platelet (Completed)   Vitamin B12 (Completed)   EKG 12-Lead (Completed)   Fatty liver   Relevant Orders   Comprehensive metabolic panel (Completed)  Hyperlipemia    Reports compliance with low dose lipitor - check dLDL as not fasting today.       Relevant Orders   LDL Cholesterol, Direct (Completed)   Hypogonadism in male    Carries this diagnosis - now off testosterone. T deficiency could contribute to many of his symptoms - consider checking 8am testosterone level if ongoing symptoms.       Laryngopharyngeal reflux (LPR)    Price out nexium 73m bid as his insurance has changed. This is more effective than pantoprazole and omeprazole.       Prostatitis    Chronic  dysuria endorsed, along with rectal pressure/fullness sensation and tender, boggy prostate on DRE. UA today normal - UCx sent. Will Rx bactrim DS 2 wk course.       Relevant Orders   Urine Culture   Type 2 diabetes mellitus with neurological manifestations, uncontrolled ([place]    Update A1c. Not compliant with diabetic diet.       Relevant Orders   Hemoglobin A1c (Completed)   EKG 12-Lead (Completed)   POCT Urinalysis Dipstick (Automated) (Completed)       Meds ordered this encounter  Medications  . esomeprazole (NEXIUM) 40 MG capsule    Sig: Take 1 capsule (40 mg total) by mouth daily.    Dispense:  60 capsule    Refill:  1  . sulfamethoxazole-trimethoprim (BACTRIM DS,SEPTRA DS) 800-160 MG tablet    Sig: Take 1 tablet by mouth 2 (two) times daily.    Dispense:  28 tablet    Refill:  0   Orders Placed This Encounter  Procedures  . Urine Culture  . Comprehensive metabolic panel  . TSH  . Hemoglobin A1c  . PSA  . CBC with Differential/Platelet  . LDL Cholesterol, Direct  . Vitamin B12  . POCT Urinalysis Dipstick (Automated)  . EKG 12-Lead    Follow up plan: Return if symptoms worsen or fail to improve.  JRia Bush MD

## 2017-06-09 NOTE — Patient Instructions (Addendum)
Price out nexium in place of protonix.  Urinalysis today EKG today Labs today We will be in touch with results.  Keep physical with Dr [name] next week.

## 2017-06-10 ENCOUNTER — Other Ambulatory Visit: Payer: Self-pay | Admitting: Family Medicine

## 2017-06-10 DIAGNOSIS — R5383 Other fatigue: Secondary | ICD-10-CM | POA: Insufficient documentation

## 2017-06-10 DIAGNOSIS — R1013 Epigastric pain: Secondary | ICD-10-CM | POA: Insufficient documentation

## 2017-06-10 DIAGNOSIS — N410 Acute prostatitis: Secondary | ICD-10-CM

## 2017-06-10 HISTORY — DX: Other fatigue: R53.83

## 2017-06-10 HISTORY — DX: Acute prostatitis: N41.0

## 2017-06-10 HISTORY — DX: Epigastric pain: R10.13

## 2017-06-10 LAB — URINE CULTURE
MICRO NUMBER: 90376397
Result:: NO GROWTH
SPECIMEN QUALITY:: ADEQUATE

## 2017-06-10 MED ORDER — VITAMIN B-12 1000 MCG PO TABS: 1000.0000 ug | ORAL_TABLET | Freq: Every day | ORAL | Status: AC

## 2017-06-10 MED ORDER — SULFAMETHOXAZOLE-TRIMETHOPRIM 800-160 MG PO TABS
1.0000 | ORAL_TABLET | Freq: Two times a day (BID) | ORAL | 0 refills | Status: DC
Start: 2017-06-10 — End: 2017-09-14

## 2017-06-10 NOTE — Assessment & Plan Note (Signed)
Several nonspecific symptoms - will check labwork for reversible causes of fatigue including B12 level, CBC, TSH. Advised he keep scheduled f/u with PCP next month for annual physical.

## 2017-06-10 NOTE — Assessment & Plan Note (Addendum)
Price out nexium 40mg  bid as his insurance has changed. This is more effective than pantoprazole and omeprazole.

## 2017-06-10 NOTE — Assessment & Plan Note (Addendum)
Carries this diagnosis - now off testosterone. T deficiency could contribute to many of his symptoms - consider checking 8am testosterone level if ongoing symptoms.

## 2017-06-10 NOTE — Assessment & Plan Note (Addendum)
Epigastric discomfort with some substernal discomfort endorsed - checked EKG today.  He endorses chronic RUQ discomfort without recent imaging. Check CBC, CMP, low threshold to obtain liver imaging.

## 2017-06-10 NOTE — Assessment & Plan Note (Signed)
Chronic dysuria endorsed, along with rectal pressure/fullness sensation and tender, boggy prostate on DRE. UA today normal - UCx sent. Will Rx bactrim DS 2 wk course.

## 2017-06-10 NOTE — Assessment & Plan Note (Signed)
Reports compliance with low dose lipitor - check dLDL as not fasting today.

## 2017-06-10 NOTE — Assessment & Plan Note (Addendum)
Update A1c. Not compliant with diabetic diet.

## 2017-06-10 NOTE — Assessment & Plan Note (Signed)
Back on benicar HCT and bp well managed.

## 2017-06-10 NOTE — Assessment & Plan Note (Signed)
Ongoing despite 3 drug regimen. See below for possible prostatitis.

## 2017-06-16 ENCOUNTER — Ambulatory Visit (INDEPENDENT_AMBULATORY_CARE_PROVIDER_SITE_OTHER): Payer: Managed Care, Other (non HMO) | Admitting: Internal Medicine

## 2017-06-16 ENCOUNTER — Encounter: Payer: Self-pay | Admitting: Internal Medicine

## 2017-06-16 VITALS — BP 138/72 | HR 94 | Temp 98.0°F | Ht 74.5 in | Wt 280.8 lb

## 2017-06-16 DIAGNOSIS — F39 Unspecified mood [affective] disorder: Secondary | ICD-10-CM | POA: Diagnosis not present

## 2017-06-16 DIAGNOSIS — E1165 Type 2 diabetes mellitus with hyperglycemia: Secondary | ICD-10-CM

## 2017-06-16 DIAGNOSIS — E785 Hyperlipidemia, unspecified: Secondary | ICD-10-CM | POA: Diagnosis not present

## 2017-06-16 DIAGNOSIS — I10 Essential (primary) hypertension: Secondary | ICD-10-CM | POA: Diagnosis not present

## 2017-06-16 DIAGNOSIS — Z0000 Encounter for general adult medical examination without abnormal findings: Secondary | ICD-10-CM | POA: Diagnosis not present

## 2017-06-16 DIAGNOSIS — E538 Deficiency of other specified B group vitamins: Secondary | ICD-10-CM | POA: Insufficient documentation

## 2017-06-16 DIAGNOSIS — IMO0002 Reserved for concepts with insufficient information to code with codable children: Secondary | ICD-10-CM

## 2017-06-16 DIAGNOSIS — K219 Gastro-esophageal reflux disease without esophagitis: Secondary | ICD-10-CM | POA: Diagnosis not present

## 2017-06-16 DIAGNOSIS — R0609 Other forms of dyspnea: Secondary | ICD-10-CM | POA: Diagnosis not present

## 2017-06-16 DIAGNOSIS — E1149 Type 2 diabetes mellitus with other diabetic neurological complication: Secondary | ICD-10-CM

## 2017-06-16 HISTORY — DX: Deficiency of other specified B group vitamins: E53.8

## 2017-06-16 LAB — HM DIABETES FOOT EXAM

## 2017-06-16 NOTE — Progress Notes (Signed)
Subjective:    Patient ID: Alejandro Koch, male    DOB: 1950-12-12, 67 y.o.   MRN: [id number]  HPI Here for physical Still has Medicare as secondary Reviewed recent visit with Dr Darnell Level (and his labs)  Has started the vitamin B12 Changed over to the sublingual  Is on the sulfa Prostate symptoms are better---but still some burning  Ongoing fatigue Did feel slightly better yesterday AM--but then crashes as the day goes on Even noted some SOB and sweating easily when walking in [employer] Has stopped walking--- was wearing him out  Has been having some epigastric pain Back on nexium now--but can only afford once a day  Admits to eating poorly A1c is up some "That is the only pleasure I get" AM sugars often over 200  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 ALPRAZolam (XANAX) 0.5 MG tablet TAKE 1 TABLET BY MOUTH THREE TIMES A DAY AS NEEDED 90 tablet 0  . aspirin 81 MG tablet Take 81 mg by mouth daily.    Marland Kitchen atorvastatin (LIPITOR) 20 MG tablet TAKE 1 TABLET (20 MG TOTAL) BY MOUTH DAILY. 90 tablet 3  . Blood Glucose Monitoring Suppl (ONETOUCH VERIO FLEX SYSTEM) w/Device KIT USE TO CHECK BLOOD SUGAR 1 kit 0  . BYDUREON 2 MG PEN INJECT 2 MG AS DIRECTED ONCE A WEEK. 4 each 11  . cyclobenzaprine (FLEXERIL) 10 MG tablet TAKE 1 TABLET BY MOUTH 3 TIMES DAILY AS NEEDED FOR MUSCLE SPASMS WHEN NOT WORKING OR DRIVING 30 tablet 0  . esomeprazole (NEXIUM) 40 MG capsule Take 1 capsule (40 mg total) by mouth daily. 60 capsule 1  . finasteride (PROSCAR) 5 MG tablet TAKE 1 TABLET (5 MG TOTAL) BY MOUTH DAILY. 90 tablet 3  . glipiZIDE (GLUCOTROL XL) 5 MG 24 hr tablet TAKE 1 TABLET (5 MG TOTAL) BY MOUTH DAILY WITH BREAKFAST. 30 tablet 11  .  hydrocortisone-pramoxine (ANALPRAM-HC) 2.5-1 % rectal cream Place 1 application rectally 3 (three) times daily as needed. 30 g 3  . ketorolac (ACULAR) 0.5 % ophthalmic solution USE AS DIRECTED 5 mL 1  . meloxicam (MOBIC) 15 MG tablet TAKE 1 TABLET (15 MG TOTAL) BY MOUTH DAILY. WITH A MEAL FOR NECK PAIN 30 tablet 2  . Meth-Hyo-M Bl-Na Phos-Ph Sal (URO-MP) 118 MG CAPS Take 1 capsule by mouth every 6 (six) hours as needed.    Marland Kitchen olmesartan-hydrochlorothiazide (BENICAR HCT) 20-12.5 MG tablet Take 1 tablet by mouth daily. 30 tablet 11  . ONETOUCH DELICA LANCETS FINE MISC 1 Units by Does not apply route daily. 100 each 2  . ONETOUCH VERIO test strip USE 1 STRIP DAILY TO CHECK BLOOD SUGAR 100 each 3  . pantoprazole (PROTONIX) 40 MG tablet Take 1 tablet (40 mg total) by mouth 2 (two) times daily. 180 tablet 3  . sulfamethoxazole-trimethoprim (BACTRIM DS,SEPTRA DS) 800-160 MG tablet Take 1 tablet by mouth 2 (two) times daily. 28 tablet 0  . tamsulosin (FLOMAX) 0.4 MG CAPS capsule TAKE 1 CAPSULE (0.4 MG TOTAL) BY MOUTH DAILY AFTER SUPPER. 90 capsule 3  . vitamin B-12 (CYANOCOBALAMIN) 1000 MCG tablet Take 1 tablet (1,000 mcg total) by mouth daily.    Marland Kitchen zolpidem (AMBIEN) 10 MG tablet TAKE ONE TABLET BY MOUTH AT BEDTIME AS NEEDED 30  tablet 0   No current facility-administered medications on file prior to visit.     Allergies  Allergen Reactions  . Iohexol      Code: HIVES, Desc: PT IS ALLERGIC TO IVP DYE AS NOTED IN PREVIOUS RECORDS IN PACS 02/2009/RM, Onset Date: [id number]   . Atorvastatin Other (See Comments)    Myalgias with this and rosuvastin  . Metformin And Related Diarrhea    Past Medical History:  Diagnosis Date  . Allergy   . BPH (benign prostatic hypertrophy)   . Depression   . Diabetes ([place])   . GERD (gastroesophageal reflux disease)   . Glaucoma   . Hyperlipidemia   . Hypertension   . Kidney stones     Past Surgical History:  Procedure Laterality Date  . COLONOSCOPY  2008  .  FINGER SURGERY  2013   infection 2nd finger left hand  . REPAIR FLEXOR TENDON HAND Right 08/2016    Family History  Problem Relation Age of Onset  . Alzheimer's disease Mother   . Heart disease Father   . COPD Father   . Colon cancer Father 60  . Prostate cancer Father   . Leukemia Unknown   . Stomach cancer Neg Hx     Social History   Socioeconomic History  . Marital status: Married    Spouse name: Not on file  . Number of children: 1  . Years of education: Not on file  . Highest education level: Not on file  Occupational History  . Occupation: Patent examiner  Social Needs  . Financial resource strain: Not on file  . Food insecurity:    Worry: Not on file    Inability: Not on file  . Transportation needs:    Medical: Not on file    Non-medical: Not on file  Tobacco Use  . Smoking status: Former Smoker    Packs/day: 2.50    Years: 7.00    Pack years: 17.50    Types: Cigarettes    Last attempt to quit: 03/18/1983    Years since quitting: 34.2  . Smokeless tobacco: Never Used  Substance and Sexual Activity  . Alcohol use: No    Alcohol/week: 0.0 oz  . Drug use: No  . Sexual activity: Yes  Lifestyle  . Physical activity:    Days per week: Not on file    Minutes per session: Not on file  . Stress: Not on file  Relationships  . Social connections:    Talks on phone: Not on file    Gets together: Not on file    Attends religious service: Not on file    Active member of club or organization: Not on file    Attends meetings of clubs or organizations: Not on file    Relationship status: Not on file  . Intimate partner violence:    Fear of current or ex partner: Not on file    Emotionally abused: Not on file    Physically abused: Not on file    Forced sexual activity: Not on file  Other Topics Concern  . Not on file  Social History Narrative   No living will or health care POA thus far   Wife, then son, should make decisions.  May make son the primary person   Would accept resuscitation attempts but wouldn't want to be on machines   No tube feeds if cognitively unaware   Review of Systems  Constitutional: Positive for fatigue. Negative for  unexpected weight change.       Wears seat belt inconsistently-- discussed  HENT: Positive for hearing loss and tinnitus. Negative for dental problem.        Keeps up with dentist Hoarse still---and clears throat  Eyes: Negative for visual disturbance.       No diplopia or unilateral vision  Respiratory: Positive for cough and shortness of breath.   Cardiovascular: Positive for palpitations and leg swelling. Negative for chest pain.  Gastrointestinal: Negative for blood in stool and constipation.  Endocrine: Negative for polydipsia and polyuria.  Genitourinary: Positive for urgency.       Flow is okay No sex--no problem  Skin: Negative for rash.       Whitish hypertrophic lesions (benign)  Allergic/Immunologic: Negative for environmental allergies and immunocompromised state.  Neurological: Negative for dizziness, syncope, light-headedness and headaches.       Only takes 1/2 benicar--otherwise has orthostatic symptoms  Hematological: Negative for adenopathy. Bruises/bleeds easily.  Psychiatric/Behavioral: Positive for dysphoric mood and sleep disturbance. The patient is nervous/anxious.        So much stress and sleep problems       Objective:   Physical Exam  Constitutional: He is oriented to person, place, and time. He appears well-developed. No distress.  HENT:  Head: Normocephalic and atraumatic.  Right Ear: External ear normal.  Left Ear: External ear normal.  Mouth/Throat: Oropharynx is clear and moist. No oropharyngeal exudate.  Eyes: Pupils are equal, round, and reactive to light. Conjunctivae are normal.  Neck: No thyromegaly present.  Cardiovascular: Normal rate, regular rhythm, normal heart sounds and intact distal pulses. Exam reveals no gallop.  No  murmur heard. Pulmonary/Chest: Effort normal and breath sounds normal. No respiratory distress. He has no wheezes. He has no rales.  Abdominal: Soft. There is no tenderness.  Musculoskeletal: He exhibits no edema or tenderness.  Lymphadenopathy:    He has no cervical adenopathy.  Neurological: He is alert and oriented to person, place, and time.  Slight decreased sensation in feet (fine touch)  Skin:  No foot lesions  Psychiatric:  Stressed out but not really depressed          Assessment & Plan:

## 2017-06-16 NOTE — Assessment & Plan Note (Signed)
Ongoing but will probably improve with the nexium

## 2017-06-16 NOTE — Patient Instructions (Signed)
You need to set up your colonoscopy and get your eye exam.

## 2017-06-16 NOTE — Assessment & Plan Note (Signed)
His fatigue and easy dyspnea is concerning for CAD Will set up evaluation with cardiology

## 2017-06-16 NOTE — Assessment & Plan Note (Signed)
His poor control is all due to his lifestyle Urged him to do better Will recheck in 3 months

## 2017-06-16 NOTE — Assessment & Plan Note (Signed)
Inconsistent with the atorvastatin Discussed---will take it twice a week

## 2017-06-16 NOTE — Assessment & Plan Note (Signed)
Will recheck at next appt.

## 2017-06-16 NOTE — Assessment & Plan Note (Signed)
Needs to unload work stress, etc Continues on the meds at night

## 2017-06-16 NOTE — Assessment & Plan Note (Signed)
BP Readings from Last 3 Encounters:  06/16/17 138/72  06/09/17 118/66  12/09/16 136/82   Reasonable control

## 2017-06-16 NOTE — Assessment & Plan Note (Signed)
Has let himself go Not healthy habits---stress with work  Needs to schedule the colonoscopy Recent PSA UTD on imms---yearly flu vaccine

## 2017-06-17 ENCOUNTER — Encounter: Payer: Self-pay | Admitting: Internal Medicine

## 2017-06-17 ENCOUNTER — Other Ambulatory Visit: Payer: Self-pay | Admitting: Internal Medicine

## 2017-06-17 NOTE — Telephone Encounter (Signed)
Pease note that he would like to see Dr Beaulah Corin may need to change the referral site (since I put down [facility])

## 2017-06-24 ENCOUNTER — Other Ambulatory Visit: Payer: Self-pay | Admitting: Family Medicine

## 2017-07-13 ENCOUNTER — Other Ambulatory Visit: Payer: Self-pay | Admitting: Internal Medicine

## 2017-07-14 ENCOUNTER — Telehealth: Payer: Self-pay

## 2017-07-14 DIAGNOSIS — R079 Chest pain, unspecified: Secondary | ICD-10-CM

## 2017-07-14 NOTE — Telephone Encounter (Signed)
Order received from Dr. Ubaldo Glassing [facility]) for patient to have cardiac CT with Dr. Johnsie Cancel to read. Dx: CP  Order placed under Dr. Ubaldo Glassing.

## 2017-07-23 ENCOUNTER — Other Ambulatory Visit: Payer: Self-pay | Admitting: Internal Medicine

## 2017-08-06 ENCOUNTER — Other Ambulatory Visit: Payer: Self-pay | Admitting: Internal Medicine

## 2017-08-17 ENCOUNTER — Other Ambulatory Visit: Payer: Self-pay | Admitting: Internal Medicine

## 2017-08-22 ENCOUNTER — Other Ambulatory Visit: Payer: Self-pay | Admitting: Internal Medicine

## 2017-09-09 ENCOUNTER — Other Ambulatory Visit: Payer: Self-pay | Admitting: Internal Medicine

## 2017-09-09 NOTE — Telephone Encounter (Signed)
Both last filled 06-08-17 #90  Last OV 06-16-17 Next OV 09-14-17

## 2017-09-14 ENCOUNTER — Encounter: Payer: Self-pay | Admitting: Internal Medicine

## 2017-09-14 ENCOUNTER — Ambulatory Visit: Payer: Managed Care, Other (non HMO) | Admitting: Internal Medicine

## 2017-09-14 VITALS — BP 120/84 | HR 102 | Temp 98.4°F | Ht 75.0 in | Wt 271.0 lb

## 2017-09-14 DIAGNOSIS — IMO0002 Reserved for concepts with insufficient information to code with codable children: Secondary | ICD-10-CM

## 2017-09-14 DIAGNOSIS — E1165 Type 2 diabetes mellitus with hyperglycemia: Secondary | ICD-10-CM | POA: Diagnosis not present

## 2017-09-14 DIAGNOSIS — E1149 Type 2 diabetes mellitus with other diabetic neurological complication: Secondary | ICD-10-CM | POA: Diagnosis not present

## 2017-09-14 LAB — POCT GLYCOSYLATED HEMOGLOBIN (HGB A1C): HEMOGLOBIN A1C: 9.1 % — AB (ref 4.0–5.6)

## 2017-09-14 MED ORDER — GLIPIZIDE ER 5 MG PO TB24: 5.0000 mg | ORAL_TABLET | Freq: Every day | ORAL | 3 refills | Status: DC

## 2017-09-14 MED ORDER — METFORMIN HCL ER 500 MG PO TB24: 500.0000 mg | ORAL_TABLET | Freq: Every day | ORAL | 11 refills | Status: DC

## 2017-09-14 NOTE — Assessment & Plan Note (Signed)
Really still not doing the right things with his diet Pleaded with him to do better Will try a low dose of metformin ER again Consider increasing glipizide or adding -flozin med

## 2017-09-14 NOTE — Progress Notes (Signed)
Subjective:    Patient ID: Alejandro Koch, male    DOB: Apr 13, 1950, 67 y.o.   MRN: [id number]  HPI Here for follow up of uncontrolled diabetes Has cut back some and lost some weight  Recent cardiology evaluation for SOB Had cath--- no CAD Some increased filling pressures Did start full doses of benicar/HCTZ instead of 1/2 now  Sugars up and down--- 140-200 at times Not disciplined in eating Still on weekly bydureon and the glipizide  Current Outpatient Medications on File Prior to Visit  Medication Sig Dispense Refill  . ALPRAZolam (XANAX) 0.5 MG tablet TAKE 1 TABLET BY MOUTH THREE TIMES A DAY AS NEEDED 90 tablet 0  . aspirin 81 MG tablet Take 81 mg by mouth daily.    Marland Kitchen atorvastatin (LIPITOR) 20 MG tablet TAKE 1 TABLET (20 MG TOTAL) BY MOUTH DAILY. 90 tablet 3  . Blood Glucose Monitoring Suppl (ONETOUCH VERIO FLEX SYSTEM) w/Device KIT USE TO CHECK BLOOD SUGAR 1 kit 0  . BYDUREON 2 MG PEN INJECT 2 MG AS DIRECTED ONCE A WEEK. 4 each 11  . cyclobenzaprine (FLEXERIL) 10 MG tablet TAKE 1 TABLET BY MOUTH 3 TIMES DAILY AS NEEDED FOR MUSCLE SPASMS WHEN NOT WORKING OR DRIVING 30 tablet 0  . esomeprazole (NEXIUM) 20 MG capsule Take 40 mg by mouth 2 (two) times daily before a meal.    . finasteride (PROSCAR) 5 MG tablet TAKE 1 TABLET (5 MG TOTAL) BY MOUTH DAILY. 90 tablet 3  . glipiZIDE (GLUCOTROL XL) 5 MG 24 hr tablet TAKE 1 TABLET (5 MG TOTAL) BY MOUTH DAILY WITH BREAKFAST. 30 tablet 11  . hydrocortisone-pramoxine (ANALPRAM-HC) 2.5-1 % rectal cream Place 1 application rectally 3 (three) times daily as needed. 30 g 3  . ketorolac (ACULAR) 0.5 % ophthalmic solution USE AS DIRECTED 5 mL 1  . meloxicam (MOBIC) 15 MG tablet TAKE 1 TABLET (15 MG TOTAL) BY MOUTH DAILY. WITH A MEAL FOR NECK PAIN 30 tablet 5  . Meth-Hyo-M Bl-Na Phos-Ph Sal (URO-MP) 118 MG CAPS Take 1 capsule by mouth every 6 (six) hours as needed.    Marland Kitchen olmesartan-hydrochlorothiazide (BENICAR HCT) 20-12.5 MG tablet Take 1 tablet by  mouth daily. 30 tablet 11  . ONETOUCH DELICA LANCETS FINE MISC 1 Units by Does not apply route daily. 100 each 2  . ONETOUCH VERIO test strip USE 1 STRIP DAILY TO CHECK BLOOD SUGAR 100 each 3  . tamsulosin (FLOMAX) 0.4 MG CAPS capsule TAKE 1 CAPSULE (0.4 MG TOTAL) BY MOUTH DAILY AFTER SUPPER. 90 capsule 3  . vitamin B-12 (CYANOCOBALAMIN) 1000 MCG tablet Take 1 tablet (1,000 mcg total) by mouth daily.    Marland Kitchen zolpidem (AMBIEN) 10 MG tablet TAKE ONE TABLET BY MOUTH AT BEDTIME AS NEEDED 30 tablet 0   No current facility-administered medications on file prior to visit.     Allergies  Allergen Reactions  . Iohexol      Code: HIVES, Desc: PT IS ALLERGIC TO IVP DYE AS NOTED IN PREVIOUS RECORDS IN PACS 02/2009/RM, Onset Date: 40981191   . Atorvastatin Other (See Comments)    Myalgias with this and rosuvastin  . Metformin And Related Diarrhea    Past Medical History:  Diagnosis Date  . Allergy   . BPH (benign prostatic hypertrophy)   . Depression   . Diabetes ([place])   . GERD (gastroesophageal reflux disease)   . Glaucoma   . Hyperlipidemia   . Hypertension   . Kidney stones     Past  Surgical History:  Procedure Laterality Date  . COLONOSCOPY  2008  . FINGER SURGERY  2013   infection 2nd finger left hand  . REPAIR FLEXOR TENDON HAND Right 08/2016    Family History  Problem Relation Age of Onset  . Alzheimer's disease Mother   . Heart disease Father   . COPD Father   . Colon cancer Father 72  . Prostate cancer Father   . Leukemia Unknown   . Stomach cancer Neg Hx     Social History   Socioeconomic History  . Marital status: Married    Spouse name: Not on file  . Number of children: 1  . Years of education: Not on file  . Highest education level: Not on file  Occupational History  . Occupation: Patent examiner  Social Needs  . Financial resource strain: Not on file  . Food insecurity:    Worry: Not on file    Inability: Not on file  .  Transportation needs:    Medical: Not on file    Non-medical: Not on file  Tobacco Use  . Smoking status: Former Smoker    Packs/day: 2.50    Years: 7.00    Pack years: 17.50    Types: Cigarettes    Last attempt to quit: 03/18/1983    Years since quitting: 34.5  . Smokeless tobacco: Never Used  Substance and Sexual Activity  . Alcohol use: No    Alcohol/week: 0.0 oz  . Drug use: No  . Sexual activity: Yes  Lifestyle  . Physical activity:    Days per week: Not on file    Minutes per session: Not on file  . Stress: Not on file  Relationships  . Social connections:    Talks on phone: Not on file    Gets together: Not on file    Attends religious service: Not on file    Active member of club or organization: Not on file    Attends meetings of clubs or organizations: Not on file    Relationship status: Not on file  . Intimate partner violence:    Fear of current or ex partner: Not on file    Emotionally abused: Not on file    Physically abused: Not on file    Forced sexual activity: Not on file  Other Topics Concern  . Not on file  Social History Narrative   No living will or health care POA thus far   Wife, then son, should make decisions. May make son the primary person   Would accept resuscitation attempts but wouldn't want to be on machines   No tube feeds if cognitively unaware   Review of Systems  Weight is down some--he is surprised SOB has improved No ankle swelling     Objective:   Physical Exam  Constitutional: He appears well-developed. No distress.  Neck: No thyromegaly present.  Cardiovascular: Normal rate, regular rhythm and normal heart sounds. Exam reveals no gallop.  No murmur heard. Respiratory: Effort normal and breath sounds normal. No respiratory distress. He has no wheezes. He has no rales.  Musculoskeletal: He exhibits no edema.  Lymphadenopathy:    He has no cervical adenopathy.           Assessment & Plan:

## 2017-09-14 NOTE — Patient Instructions (Signed)
Please let me know if you can't tolerate the metformin. I would then increase the glipizide to 10mg  daily.

## 2017-09-22 ENCOUNTER — Other Ambulatory Visit: Payer: Self-pay | Admitting: Internal Medicine

## 2017-12-13 ENCOUNTER — Other Ambulatory Visit: Payer: Self-pay | Admitting: Internal Medicine

## 2017-12-14 NOTE — Telephone Encounter (Signed)
Both Last Filled 09-10-17 Zolpidem #30 Alprazolam #90 Last OV 09-14-17 Next OV 12-23-17

## 2017-12-22 ENCOUNTER — Ambulatory Visit: Payer: Managed Care, Other (non HMO) | Admitting: Internal Medicine

## 2017-12-23 ENCOUNTER — Ambulatory Visit: Payer: Managed Care, Other (non HMO) | Admitting: Internal Medicine

## 2017-12-23 ENCOUNTER — Encounter: Payer: Self-pay | Admitting: Internal Medicine

## 2017-12-23 VITALS — BP 108/70 | HR 98 | Temp 97.6°F | Ht 75.0 in | Wt 281.0 lb

## 2017-12-23 DIAGNOSIS — IMO0002 Reserved for concepts with insufficient information to code with codable children: Secondary | ICD-10-CM

## 2017-12-23 DIAGNOSIS — Z23 Encounter for immunization: Secondary | ICD-10-CM

## 2017-12-23 DIAGNOSIS — E1165 Type 2 diabetes mellitus with hyperglycemia: Secondary | ICD-10-CM | POA: Diagnosis not present

## 2017-12-23 DIAGNOSIS — E1149 Type 2 diabetes mellitus with other diabetic neurological complication: Secondary | ICD-10-CM | POA: Diagnosis not present

## 2017-12-23 LAB — POCT GLYCOSYLATED HEMOGLOBIN (HGB A1C): HEMOGLOBIN A1C: 8.9 % — AB (ref 4.0–5.6)

## 2017-12-23 MED ORDER — METFORMIN HCL ER 500 MG PO TB24: 1000.0000 mg | ORAL_TABLET | Freq: Every day | ORAL | 3 refills | Status: DC

## 2017-12-23 NOTE — Progress Notes (Signed)
Subjective:    Patient ID: Alejandro Koch, male    DOB: 05-10-1950, 67 y.o.   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 ALPRAZolam (XANAX) 0.5 MG tablet TAKE 1 TABLET BY MOUTH THREE TIMES A DAY AS NEEDED 90 tablet 0  . aspirin 81 MG tablet Take 81 mg by mouth daily.    Marland Kitchen atorvastatin (LIPITOR) 20 MG tablet TAKE 1 TABLET (20 MG TOTAL) BY MOUTH DAILY. 90 tablet 3  . Blood Glucose Monitoring Suppl (ONETOUCH VERIO FLEX SYSTEM) w/Device KIT USE TO CHECK BLOOD SUGAR 1 kit 0  . BYDUREON 2 MG PEN INJECT 2 MG AS DIRECTED ONCE A WEEK. 4 each 11  . cyclobenzaprine (FLEXERIL) 10 MG tablet TAKE 1 TABLET BY MOUTH 3 TIMES DAILY AS NEEDED FOR MUSCLE SPASMS WHEN NOT WORKING OR DRIVING 30 tablet 0  . esomeprazole (NEXIUM) 20 MG capsule Take 40 mg by mouth 2 (two) times daily before a meal.    . finasteride (PROSCAR) 5 MG tablet TAKE 1 TABLET (5 MG TOTAL) BY MOUTH DAILY. 90 tablet 3  . glipiZIDE (GLUCOTROL XL) 5 MG 24 hr tablet Take 1 tablet (5 mg total) by mouth daily with breakfast. 90 tablet 3  . hydrocortisone-pramoxine (ANALPRAM-HC) 2.5-1 % rectal cream Place 1 application rectally 3 (three) times daily as needed. 30 g 3  . ketorolac (ACULAR) 0.5 % ophthalmic solution USE AS DIRECTED 5 mL 1  . meloxicam (MOBIC) 15 MG tablet TAKE 1 TABLET (15 MG TOTAL) BY MOUTH DAILY. WITH A MEAL FOR NECK PAIN 30 tablet 5  . metFORMIN (GLUCOPHAGE-XR) 500 MG 24 hr tablet Take 1 tablet (500 mg total)  by mouth daily with breakfast. 30 tablet 11  . Meth-Hyo-M Bl-Na Phos-Ph Sal (URO-MP) 118 MG CAPS Take 1 capsule by mouth every 6 (six) hours as needed.    Marland Kitchen olmesartan-hydrochlorothiazide (BENICAR HCT) 20-12.5 MG tablet Take 1 tablet by mouth daily. 30 tablet 11  . ONETOUCH DELICA LANCETS FINE MISC 1 Units by Does not apply route daily. 100 each 2  . ONETOUCH VERIO test strip USE 1 STRIP DAILY TO CHECK BLOOD SUGAR 100 each 3  . tamsulosin (FLOMAX) 0.4 MG CAPS capsule TAKE 1 CAPSULE (0.4 MG TOTAL) BY MOUTH DAILY AFTER SUPPER. 90 capsule 3  . vitamin B-12 (CYANOCOBALAMIN) 1000 MCG tablet Take 1 tablet (1,000 mcg total) by mouth daily.    Marland Kitchen zolpidem (AMBIEN) 10 MG tablet TAKE ONE TABLET BY MOUTH AT BEDTIME AS NEEDED 30 tablet 0   No current facility-administered medications on file prior to visit.     Allergies  Allergen Reactions  . Iohexol      Code: HIVES, Desc: PT IS ALLERGIC TO IVP DYE AS NOTED IN PREVIOUS RECORDS IN PACS 02/2009/RM, Onset Date: 23953202   . Atorvastatin Other (See Comments)    Myalgias with this and rosuvastin  . Metformin And Related Diarrhea    Past Medical History:  Diagnosis Date  . Allergy   .  BPH (benign prostatic hypertrophy)   . Depression   . Diabetes ([place])   . GERD (gastroesophageal reflux disease)   . Glaucoma   . Hyperlipidemia   . Hypertension   . Kidney stones     Past Surgical History:  Procedure Laterality Date  . COLONOSCOPY  2008  . FINGER SURGERY  2013   infection 2nd finger left hand  . REPAIR FLEXOR TENDON HAND Right 08/2016    Family History  Problem Relation Age of Onset  . Alzheimer's disease Mother   . Heart disease Father   . COPD Father   . Colon cancer Father 81  . Prostate cancer Father   . Leukemia Unknown   . Stomach cancer Neg Hx     Social History   Socioeconomic History  . Marital status: Married    Spouse name: Not on file  . Number of children: 1  . Years of education: Not on file  . Highest education  level: Not on file  Occupational History  . 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 Financial resource strain: Not on file  . Food insecurity:    Worry: Not on file    Inability: Not on file  . Transportation needs:    Medical: Not on file    Non-medical: Not on file  Tobacco Use  . Smoking status: Former Smoker    Packs/day: 2.50    Years: 7.00    Pack years: 17.50    Types: Cigarettes    Last attempt to quit: 03/18/1983    Years since quitting: 34.7  . Smokeless tobacco: Never Used  Substance and Sexual Activity  . Alcohol use: No    Alcohol/week: 0.0 standard drinks  . Drug use: No  . Sexual activity: Yes  Lifestyle  . Physical activity:    Days per week: Not on file    Minutes per session: Not on file  . Stress: Not on file  Relationships  . Social connections:    Talks on phone: Not on file    Gets together: Not on file    Attends religious service: Not on file    Active member of club or organization: Not on file    Attends meetings of clubs or organizations: Not on file    Relationship status: Not on file  . Intimate partner violence:    Fear of current or ex partner: Not on file    Emotionally abused: Not on file    Physically abused: Not on file    Forced sexual activity: Not on file  Other Topics Concern  . Not on file  Social History Narrative   No living will or health care POA thus far   Wife, then son, should make decisions. 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 No distress.  Neck: No thyromegaly present.  Cardiovascular: Normal rate, regular rhythm,  normal heart sounds and intact distal pulses. Exam reveals no gallop.  No murmur heard. Respiratory: Effort normal and breath sounds normal. No respiratory distress. He has no wheezes. He has no rales.  Musculoskeletal:  Tenderness at distal 5th  metatarsal on left--discussed better support shoes (or see Dr Lorelei Pont prn)  Lymphadenopathy:    He has no cervical adenopathy.  Skin:  No foot lesions  Psychiatric: He has a normal mood and affect. His behavior is normal.           Assessment & Plan:

## 2017-12-23 NOTE — Assessment & Plan Note (Signed)
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

## 2018-01-02 ENCOUNTER — Other Ambulatory Visit: Payer: Self-pay | Admitting: Internal Medicine

## 2018-01-04 ENCOUNTER — Encounter: Payer: Self-pay | Admitting: Internal Medicine

## 2018-01-04 LAB — HM DIABETES EYE EXAM

## 2018-01-05 ENCOUNTER — Encounter: Payer: Self-pay | Admitting: Internal Medicine

## 2018-01-05 ENCOUNTER — Other Ambulatory Visit: Payer: Self-pay | Admitting: Internal Medicine

## 2018-01-12 ENCOUNTER — Other Ambulatory Visit: Payer: Self-pay | Admitting: Internal Medicine

## 2018-01-12 NOTE — Telephone Encounter (Signed)
Both last filled 12-14-17 Zolpidem #30 Alprazolam #90 Last OV 12-23-17 Next OV 03-30-18

## 2018-01-15 ENCOUNTER — Encounter: Payer: Self-pay | Admitting: Internal Medicine

## 2018-01-15 ENCOUNTER — Ambulatory Visit: Payer: Managed Care, Other (non HMO) | Admitting: Internal Medicine

## 2018-01-15 VITALS — BP 132/74 | HR 81 | Temp 98.4°F | Ht 75.0 in | Wt 284.0 lb

## 2018-01-15 DIAGNOSIS — N410 Acute prostatitis: Secondary | ICD-10-CM | POA: Diagnosis not present

## 2018-01-15 DIAGNOSIS — R300 Dysuria: Secondary | ICD-10-CM

## 2018-01-15 LAB — POC URINALSYSI DIPSTICK (AUTOMATED)
Bilirubin, UA: NEGATIVE
Blood, UA: NEGATIVE
GLUCOSE UA: POSITIVE — AB
Ketones, UA: NEGATIVE
LEUKOCYTES UA: NEGATIVE
Nitrite, UA: NEGATIVE
PROTEIN UA: NEGATIVE
Spec Grav, UA: 1.015 (ref 1.010–1.025)
UROBILINOGEN UA: 0.2 U/dL
pH, UA: 5.5 (ref 5.0–8.0)

## 2018-01-15 MED ORDER — SULFAMETHOXAZOLE-TRIMETHOPRIM 800-160 MG PO TABS: 1.0000 | ORAL_TABLET | Freq: Two times a day (BID) | ORAL | 0 refills | Status: DC

## 2018-01-15 NOTE — Progress Notes (Signed)
Subjective:    Patient ID: Alejandro Koch, male    DOB: 1951-02-02, 67 y.o.   MRN: [id number]  HPI Here due to urinary symptoms Has noted some burning dysuria-- mostly for 3 days Some vague discomfort in right flank as well--noticed that 1 week ago Doesn't feel like one of his stones  Tried old pyridium---eases off the pain No hematuria  Current Outpatient Medications on File Prior to Visit  Medication Sig Dispense Refill  . ALPRAZolam (XANAX) 0.5 MG tablet TAKE 1 TABLET BY MOUTH THREE TIMES A DAY AS NEEDED 90 tablet 0  . aspirin 81 MG tablet Take 81 mg by mouth daily.    Marland Kitchen atorvastatin (LIPITOR) 20 MG tablet TAKE 1 TABLET (20 MG TOTAL) BY MOUTH DAILY. 90 tablet 3  . Blood Glucose Monitoring Suppl (ONETOUCH VERIO FLEX SYSTEM) w/Device KIT USE TO CHECK BLOOD SUGAR 1 kit 0  . BYDUREON 2 MG PEN INJECT 2 MG AS DIRECTED ONCE A WEEK. 4 each 12  . cyclobenzaprine (FLEXERIL) 10 MG tablet TAKE 1 TABLET BY MOUTH 3 TIMES DAILY AS NEEDED FOR MUSCLE SPASMS WHEN NOT WORKING OR DRIVING 30 tablet 0  . esomeprazole (NEXIUM) 20 MG capsule Take 40 mg by mouth 2 (two) times daily before a meal.    . finasteride (PROSCAR) 5 MG tablet TAKE 1 TABLET (5 MG TOTAL) BY MOUTH DAILY. 90 tablet 3  . glipiZIDE (GLUCOTROL XL) 5 MG 24 hr tablet Take 1 tablet (5 mg total) by mouth daily with breakfast. 90 tablet 3  . hydrocortisone-pramoxine (ANALPRAM-HC) 2.5-1 % rectal cream Place 1 application rectally 3 (three) times daily as needed. 30 g 3  . ketorolac (ACULAR) 0.5 % ophthalmic solution USE AS DIRECTED 5 mL 1  . meloxicam (MOBIC) 15 MG tablet TAKE 1 TABLET (15 MG TOTAL) BY MOUTH DAILY. WITH A MEAL FOR NECK PAIN 30 tablet 5  . metFORMIN (GLUCOPHAGE-XR) 500 MG 24 hr tablet Take 2 tablets (1,000 mg total) by mouth daily with breakfast. 180 tablet 3  . Meth-Hyo-M Bl-Na Phos-Ph Sal (URO-MP) 118 MG CAPS Take 1 capsule by mouth every 6 (six) hours as needed.    Marland Kitchen olmesartan-hydrochlorothiazide (BENICAR HCT) 20-12.5 MG  tablet Take 1 tablet by mouth daily. 30 tablet 11  . ONETOUCH DELICA LANCETS FINE MISC 1 Units by Does not apply route daily. 100 each 2  . ONETOUCH VERIO test strip USE 1 STRIP DAILY TO CHECK BLOOD SUGAR 100 each 3  . tamsulosin (FLOMAX) 0.4 MG CAPS capsule TAKE 1 CAPSULE (0.4 MG TOTAL) BY MOUTH DAILY AFTER SUPPER. 90 capsule 3  . vitamin B-12 (CYANOCOBALAMIN) 1000 MCG tablet Take 1 tablet (1,000 mcg total) by mouth daily.    Marland Kitchen zolpidem (AMBIEN) 10 MG tablet TAKE 1 TABLET BY MOUTH AT BEDTIME AS NEEDED 30 tablet 0   No current facility-administered medications on file prior to visit.     Allergies  Allergen Reactions  . Iohexol      Code: HIVES, Desc: PT IS ALLERGIC TO IVP DYE AS NOTED IN PREVIOUS RECORDS IN PACS 02/2009/RM, Onset Date: 40981191   . Atorvastatin Other (See Comments)    Myalgias with this and rosuvastin  . Metformin And Related Diarrhea    Past Medical History:  Diagnosis Date  . Allergy   . BPH (benign prostatic hypertrophy)   . Depression   . Diabetes ([place])   . GERD (gastroesophageal reflux disease)   . Glaucoma   . Hyperlipidemia   . Hypertension   .  Kidney stones     Past Surgical History:  Procedure Laterality Date  . COLONOSCOPY  2008  . FINGER SURGERY  2013   infection 2nd finger left hand  . REPAIR FLEXOR TENDON HAND Right 08/2016    Family History  Problem Relation Age of Onset  . Alzheimer's disease Mother   . Heart disease Father   . COPD Father   . Colon cancer Father 73  . Prostate cancer Father   . Leukemia Unknown   . Stomach cancer Neg Hx     Social History   Socioeconomic History  . Marital status: Married    Spouse name: Not on file  . Number of children: 1  . Years of education: Not on file  . Highest education level: Not on file  Occupational History  . Occupation: Homeowners association management    Comment: retired  . Occupation: Adult nurse estate  Social Needs  . Financial resource strain: Not on file  .  Food insecurity:    Worry: Not on file    Inability: Not on file  . Transportation needs:    Medical: Not on file    Non-medical: Not on file  Tobacco Use  . Smoking status: Former Smoker    Packs/day: 2.50    Years: 7.00    Pack years: 17.50    Types: Cigarettes    Last attempt to quit: 03/18/1983    Years since quitting: 34.8  . Smokeless tobacco: Never Used  Substance and Sexual Activity  . Alcohol use: No    Alcohol/week: 0.0 standard drinks  . Drug use: No  . Sexual activity: Yes  Lifestyle  . Physical activity:    Days per week: Not on file    Minutes per session: Not on file  . Stress: Not on file  Relationships  . Social connections:    Talks on phone: Not on file    Gets together: Not on file    Attends religious service: Not on file    Active member of club or organization: Not on file    Attends meetings of clubs or organizations: Not on file    Relationship status: Not on file  . Intimate partner violence:    Fear of current or ex partner: Not on file    Emotionally abused: Not on file    Physically abused: Not on file    Forced sexual activity: Not on file  Other Topics Concern  . Not on file  Social History Narrative   No living will or health care POA thus far   Wife, then son, should make decisions. May make son the primary person   Would accept resuscitation attempts but wouldn't want to be on machines   No tube feeds if cognitively unaware   Review of Systems No fever Does feel achy No penis rash No sex in many years Does have some rectal pressure --like past prostate infections    Objective:   Physical Exam  GI: Soft. He exhibits no distension. There is no tenderness. There is no rebound and no guarding.  Genitourinary:  Genitourinary Comments: Prostate extremely tender (didn't seem overly swollen though)  Musculoskeletal:  No CVA tenderness           Assessment & Plan:

## 2018-01-15 NOTE — Assessment & Plan Note (Signed)
Classic presentation Will treat with sulfa x 2 weeks He did well with this back in March

## 2018-02-05 ENCOUNTER — Other Ambulatory Visit: Payer: Self-pay | Admitting: *Deleted

## 2018-02-05 NOTE — Telephone Encounter (Signed)
Spoke to pt who states he has completed his abx and his symptoms have improved but are not completely gone. Requesting refill of abx

## 2018-02-06 MED ORDER — SULFAMETHOXAZOLE-TRIMETHOPRIM 800-160 MG PO TABS: 1.0000 | ORAL_TABLET | Freq: Two times a day (BID) | ORAL | 0 refills | Status: DC

## 2018-02-06 NOTE — Telephone Encounter (Signed)
Make sure he knows did send refill

## 2018-02-08 NOTE — Telephone Encounter (Signed)
Left message on vm for pt 

## 2018-02-16 ENCOUNTER — Ambulatory Visit (AMBULATORY_SURGERY_CENTER): Payer: Self-pay | Admitting: *Deleted

## 2018-02-16 VITALS — Ht 77.0 in | Wt 284.0 lb

## 2018-02-16 DIAGNOSIS — Z800 Family history of malignant neoplasm of digestive organs: Secondary | ICD-10-CM

## 2018-02-16 DIAGNOSIS — Z86010 Personal history of colonic polyps: Secondary | ICD-10-CM

## 2018-02-16 MED ORDER — NA SULFATE-K SULFATE-MG SULF 17.5-3.13-1.6 GM/177ML PO SOLN: 1.0000 | Freq: Once | ORAL | 0 refills | Status: AC

## 2018-02-16 NOTE — Progress Notes (Signed)
No egg or soy allergy known to patient  No issues with past sedation with any surgeries  or procedures, no intubation problems  No diet pills per patient No home 02 use per patient  No blood thinners per patient  Pt denies issues with constipation  No A fib or A flutter  EMMI video sent to pt's e mail - pt declined Suprep $15 coupon to pt

## 2018-02-17 ENCOUNTER — Encounter: Payer: Self-pay | Admitting: Internal Medicine

## 2018-03-02 ENCOUNTER — Ambulatory Visit (AMBULATORY_SURGERY_CENTER): Payer: Managed Care, Other (non HMO) | Admitting: Internal Medicine

## 2018-03-02 ENCOUNTER — Encounter: Payer: Self-pay | Admitting: Internal Medicine

## 2018-03-02 VITALS — BP 130/76 | HR 84 | Temp 97.8°F | Resp 12 | Ht 77.0 in | Wt 284.0 lb

## 2018-03-02 DIAGNOSIS — Z86010 Personal history of colonic polyps: Secondary | ICD-10-CM

## 2018-03-02 DIAGNOSIS — Z800 Family history of malignant neoplasm of digestive organs: Secondary | ICD-10-CM

## 2018-03-02 DIAGNOSIS — D122 Benign neoplasm of ascending colon: Secondary | ICD-10-CM

## 2018-03-02 MED ORDER — SODIUM CHLORIDE 0.9 % IV SOLN: 500.0000 mL | Freq: Once | INTRAVENOUS | Status: DC

## 2018-03-02 NOTE — Progress Notes (Signed)
Called to room to assist during endoscopic procedure.  Patient ID and intended procedure confirmed with present staff. Received instructions for my participation in the procedure from the performing physician.  

## 2018-03-02 NOTE — Op Note (Signed)
[name] Patient Name: Alejandro Koch Procedure Date: 03/02/2018 10:55 AM MRN: [id number] Endoscopist: Docia Chuck. Henrene Pastor , MD Age: 67 Referring MD:  Date of Birth: Apr 18, 1950 Gender: Male Account #: [id number] Procedure:                Colonoscopy with cold snare polypectomy x 2 Indications:              High risk colon cancer surveillance: Personal                            history of multiple (3 or more) adenomas. Also,                            father with colon cancer around age 12. Previous                            examinations 1998, 2004, 2008, 2011, 2014 Medicines:                Monitored Anesthesia Care Procedure:                Pre-Anesthesia Assessment:                           - Prior to the procedure, a History and Physical                            was performed, and patient medications and                            allergies were reviewed. The patient's tolerance of                            previous anesthesia was also reviewed. The risks                            and benefits of the procedure and the sedation                            options and risks were discussed with the patient.                            All questions were answered, and informed consent                            was obtained. Prior Anticoagulants: The patient has                            taken no previous anticoagulant or antiplatelet                            agents. ASA Grade Assessment: II - A patient with                            mild systemic disease. After reviewing the risks  and benefits, the patient was deemed in                            satisfactory condition to undergo the procedure.                           After obtaining informed consent, the colonoscope                            was passed under direct vision. Throughout the                            procedure, the patient's blood pressure, pulse, and     oxygen saturations were monitored continuously. The                            Model CF-HQ190L [id number]) scope was introduced                            through the anus and advanced to the the cecum,                            identified by appendiceal orifice and ileocecal                            valve. The ileocecal valve, appendiceal orifice,                            and rectum were photographed. The quality of the                            bowel preparation was excellent. The colonoscopy                            was performed without difficulty. The patient                            tolerated the procedure well. The bowel preparation                            used was SUPREP. Scope In: 11:12:37 AM Scope Out: 11:26:32 AM Scope Withdrawal Time: 0 hours 12 minutes 4 seconds  Total Procedure Duration: 0 hours 13 minutes 55 seconds  Findings:                 Two polyps were found in the ascending colon. The                            polyps were 1 to 3 mm in size. These polyps were                            removed with a cold snare. Resection and retrieval  were complete.                           Multiple diverticula were found in the sigmoid                            colon.                           The exam was otherwise without abnormality on                            direct and retroflexion views. Complications:            No immediate complications. Estimated blood loss:                            None. Estimated Blood Loss:     Estimated blood loss: none. Impression:               - Two 1 to 3 mm polyps in the ascending colon,                            removed with a cold snare. Resected and retrieved.                           - Diverticulosis in the sigmoid colon.                           - The examination was otherwise normal on direct                            and retroflexion views. Recommendation:           - Repeat  colonoscopy in 5 years for surveillance.                           - Patient has a contact number available for                            emergencies. The signs and symptoms of potential                            delayed complications were discussed with the                            patient. Return to normal activities tomorrow.                            Written discharge instructions were provided to the                            patient.                           - Resume previous diet.                           -  Continue present medications.                           - Await pathology results. Docia Chuck. Henrene Pastor, MD 03/02/2018 11:31:20 AM This report has been signed electronically.

## 2018-03-02 NOTE — Progress Notes (Signed)
Pt's states no medical or surgical changes since previsit or office visit. 

## 2018-03-02 NOTE — Patient Instructions (Signed)
Thank you for allowing [place] to care for you today!  Await pathology results , approximately 2 weeks.  Handout provided.  Recommend surveillance colonoscopy in 5 years   Resume previous diet and medications today.  Return to normal activities tomorrow.     YOU HAD AN ENDOSCOPIC PROCEDURE TODAY AT [facility]:   Refer to the procedure report that was given to you for any specific questions about what was found during the examination.  If the procedure report does not answer your questions, please call your gastroenterologist to clarify.  If you requested that your care partner not be given the details of your procedure findings, then the procedure report has been included in a sealed envelope for you to review at your convenience later.  YOU SHOULD EXPECT: Some feelings of bloating in the abdomen. Passage of more gas than usual.  Walking can help get rid of the air that was put into your GI tract during the procedure and reduce the bloating. If you had a lower endoscopy (such as a colonoscopy or flexible sigmoidoscopy) you may notice spotting of blood in your stool or on the toilet paper. If you underwent a bowel prep for your procedure, you may not have a normal bowel movement for a few days.  Please Note:  You might notice some irritation and congestion in your nose or some drainage.  This is from the oxygen used during your procedure.  There is no need for concern and it should clear up in a day or so.  SYMPTOMS TO REPORT IMMEDIATELY:   Following lower endoscopy (colonoscopy or flexible sigmoidoscopy):  Excessive amounts of blood in the stool  Significant tenderness or worsening of abdominal pains  Swelling of the abdomen that is new, acute  Fever of 100F or higher    For urgent or emergent issues, a gastroenterologist can be reached at any hour by calling [phone].   DIET:  We do recommend a small meal at first, but then you may proceed to your regular diet.   Drink plenty of fluids but you should avoid alcoholic beverages for 24 hours.  ACTIVITY:  You should plan to take it easy for the rest of today and you should NOT DRIVE or use heavy machinery until tomorrow (because of the sedation medicines used during the test).    FOLLOW UP: Our staff will call the number listed on your records the next business day following your procedure to check on you and address any questions or concerns that you may have regarding the information given to you following your procedure. If we do not reach you, we will leave a message.  However, if you are feeling well and you are not experiencing any problems, there is no need to return our call.  We will assume that you have returned to your regular daily activities without incident.  If any biopsies were taken you will be contacted by phone or by letter within the next 1-3 weeks.  Please call us at [phone] if you have not heard about the biopsies in 3 weeks.    SIGNATURES/CONFIDENTIALITY: You and/or your care partner have signed paperwork which will be entered into your electronic medical record.  These signatures attest to the fact that that the information above on your After Visit Summary has been reviewed and is understood.  Full responsibility of the confidentiality of this discharge information lies with you and/or your care-partner.

## 2018-03-02 NOTE — Progress Notes (Signed)
Spontaneous respirations throughout. VSS. Resting comfortably. To PACU on room air. Report to  RN. 

## 2018-03-03 ENCOUNTER — Telehealth: Payer: Self-pay

## 2018-03-03 NOTE — Telephone Encounter (Signed)
  Follow up Call-  Call back number 03/02/2018  Post procedure Call Back phone  # [phone]  Permission to leave phone message Yes  Some recent data might be hidden     Patient questions:  Do you have a fever, pain , or abdominal swelling? No. Pain Score  0 *  Have you tolerated food without any problems? Yes.    Have you been able to return to your normal activities? Yes.    Do you have any questions about your discharge instructions: Diet   No. Medications  No. Follow up visit  No.  Do you have questions or concerns about your Care? No.  Actions: * If pain score is 4 or above: No action needed, pain <4. No problems noted per pt. maw

## 2018-03-08 ENCOUNTER — Encounter: Payer: Self-pay | Admitting: Internal Medicine

## 2018-03-19 ENCOUNTER — Other Ambulatory Visit: Payer: Self-pay | Admitting: Internal Medicine

## 2018-03-19 NOTE — Telephone Encounter (Signed)
Alprazolam  Last filled 01-12-18 #90 Last OV 12-23-17 Next OV 03-30-18  Zolpidem last filled 01-12-18 #30  [facility]

## 2018-03-30 ENCOUNTER — Encounter: Payer: Self-pay | Admitting: Internal Medicine

## 2018-03-30 ENCOUNTER — Ambulatory Visit: Payer: Managed Care, Other (non HMO) | Admitting: Internal Medicine

## 2018-03-30 VITALS — BP 116/70 | HR 92 | Temp 98.3°F | Ht 77.0 in | Wt 279.0 lb

## 2018-03-30 DIAGNOSIS — R0609 Other forms of dyspnea: Secondary | ICD-10-CM | POA: Diagnosis not present

## 2018-03-30 DIAGNOSIS — E1149 Type 2 diabetes mellitus with other diabetic neurological complication: Secondary | ICD-10-CM

## 2018-03-30 DIAGNOSIS — IMO0002 Reserved for concepts with insufficient information to code with codable children: Secondary | ICD-10-CM

## 2018-03-30 DIAGNOSIS — R413 Other amnesia: Secondary | ICD-10-CM | POA: Diagnosis not present

## 2018-03-30 DIAGNOSIS — I10 Essential (primary) hypertension: Secondary | ICD-10-CM | POA: Diagnosis not present

## 2018-03-30 DIAGNOSIS — E538 Deficiency of other specified B group vitamins: Secondary | ICD-10-CM

## 2018-03-30 DIAGNOSIS — E1165 Type 2 diabetes mellitus with hyperglycemia: Secondary | ICD-10-CM

## 2018-03-30 LAB — POCT GLYCOSYLATED HEMOGLOBIN (HGB A1C): Hemoglobin A1C: 8.7 % — AB (ref 4.0–5.6)

## 2018-03-30 NOTE — Assessment & Plan Note (Signed)
Does take supplement daily Will check with full labs at next visit

## 2018-03-30 NOTE — Assessment & Plan Note (Signed)
BP Readings from Last 3 Encounters:  03/30/18 116/70  03/02/18 130/76  01/15/18 132/74   Better now with compliance with the med

## 2018-03-30 NOTE — Patient Instructions (Signed)
Please consider Weight Watchers or using MyFitness Pal app to help with your eating.

## 2018-03-30 NOTE — Assessment & Plan Note (Signed)
Lab Results  Component Value Date   HGBA1C 8.7 (A) 03/30/2018   Not much better He has no discipline I want to send him to endocrine--he wants to hold off (doesn't want insulin, etc) Needs formal help---like Weight Watchers Will give him 3 months more

## 2018-03-30 NOTE — Progress Notes (Signed)
Subjective:    Patient ID: Alejandro Koch, male    DOB: 1951-02-13, 68 y.o.   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 albuterol (PROVENTIL HFA;VENTOLIN HFA) 108 (90 Base) MCG/ACT inhaler Inhale into the lungs.    . ALPRAZolam (XANAX) 0.5 MG tablet TAKE 1 TABLET BY MOUTH THREE TIMES A DAY AS NEEDED 90 tablet 0  . aspirin 325 MG tablet Take 325 mg by mouth daily.    Marland Kitchen aspirin 81 MG tablet Take 81 mg by mouth daily.    Marland Kitchen atorvastatin (LIPITOR) 20 MG tablet TAKE 1 TABLET (20 MG TOTAL) BY MOUTH DAILY. 90 tablet 3  . Blood Glucose Monitoring Suppl (ONETOUCH VERIO FLEX SYSTEM) w/Device KIT USE TO CHECK BLOOD SUGAR 1 kit 0  . BYDUREON 2 MG PEN INJECT 2 MG AS DIRECTED ONCE A WEEK. 4 each 12  . cyclobenzaprine (FLEXERIL) 10 MG tablet TAKE 1 TABLET BY MOUTH 3 TIMES DAILY AS NEEDED FOR MUSCLE SPASMS WHEN NOT WORKING OR DRIVING 30 tablet 0  . esomeprazole (NEXIUM) 20 MG capsule Take 40 mg by mouth 2 (two) times daily before a meal.    . finasteride (PROSCAR) 5 MG tablet TAKE 1 TABLET (5 MG TOTAL) BY MOUTH DAILY. 90 tablet 3  . fluticasone furoate-vilanterol (BREO ELLIPTA) 100-25 MCG/INH AEPB Inhale into the lungs.    Marland Kitchen glipiZIDE (GLUCOTROL XL) 5 MG 24 hr tablet Take 1 tablet (5 mg total) by mouth daily with breakfast. 90 tablet 3  . hydrocortisone-pramoxine (ANALPRAM-HC) 2.5-1 % rectal cream Place 1 application rectally 3 (three) times daily as needed. 30 g 3  . ketorolac (ACULAR) 0.5 % ophthalmic solution USE AS DIRECTED 5  mL 1  . meloxicam (MOBIC) 15 MG tablet TAKE 1 TABLET (15 MG TOTAL) BY MOUTH DAILY. WITH A MEAL FOR NECK PAIN 30 tablet 5  . metFORMIN (GLUCOPHAGE-XR) 500 MG 24 hr tablet Take 2 tablets (1,000 mg total) by mouth daily with breakfast. 180 tablet 3  . Meth-Hyo-M Bl-Na Phos-Ph Sal (URO-MP) 118 MG CAPS Take 1 capsule by mouth every 6 (six) hours as needed.    . nitroGLYCERIN (NITROSTAT) 0.4 MG SL tablet Place under the tongue.    Marland Kitchen olmesartan-hydrochlorothiazide (BENICAR HCT) 20-12.5 MG tablet Take 1 tablet by mouth daily. 30 tablet 11  . ONETOUCH DELICA LANCETS FINE MISC 1 Units by Does not apply route daily. 100 each 2  . ONETOUCH VERIO test strip USE 1 STRIP DAILY TO CHECK BLOOD SUGAR 100 each 3  . tamsulosin (FLOMAX) 0.4 MG CAPS capsule TAKE 1 CAPSULE (0.4 MG TOTAL) BY MOUTH DAILY AFTER SUPPER. 90 capsule 3  . vitamin B-12 (CYANOCOBALAMIN) 1000 MCG tablet Take 1 tablet (1,000 mcg total) by mouth daily.    Marland Kitchen zolpidem (AMBIEN) 10 MG tablet TAKE 1 TABLET BY MOUTH EVERY DAY AT BEDTIME AS NEEDED 30 tablet 0   No current facility-administered medications on file prior to visit.     Allergies  Allergen Reactions  . Iohexol  Code: HIVES, Desc: PT IS ALLERGIC TO IVP DYE AS NOTED IN PREVIOUS RECORDS IN PACS 02/2009/RM, Onset Date: [id number]   . Atorvastatin Other (See Comments)    Myalgias with this and rosuvastin  . Metformin And Related Diarrhea    Past Medical History:  Diagnosis Date  . Allergy   . Anemia   . Blood transfusion without reported diagnosis    40 yrs ago   . BPH (benign prostatic hypertrophy)   . CHF (congestive heart failure) ([place])   . Depression   . Diabetes ([place])   . GERD (gastroesophageal reflux disease)   . Glaucoma   . Hyperlipidemia   . Hypertension   . Kidney stones   . Sleep apnea    no cpap     Past Surgical History:  Procedure Laterality Date  . CARDIAC CATHETERIZATION     [facility] - myocard perf and echo done as well - all normal- EF 48-55% 2019 April  and june 2019  . COLONOSCOPY  2008  . FINGER SURGERY  2013   infection 2nd finger left hand  . POLYPECTOMY    . REPAIR FLEXOR TENDON HAND Right 08/2016    Family History  Problem Relation Age of Onset  . Alzheimer's disease Mother   . Heart disease Father   . COPD Father   . Colon cancer Father 61  . Prostate cancer Father   . Colon polyps Brother   . Leukemia Brother   . Leukemia Other   . Stomach cancer Neg Hx   . Esophageal cancer Neg Hx   . Rectal cancer Neg Hx     Social History   Socioeconomic History  . Marital status: Married    Spouse name: Not on file  . Number of children: 1  . Years of education: Not on file  . Highest education level: Not on file  Occupational History  . Occupation: Homeowners association management    Comment: retired  . Occupation: Adult nurse estate  Social Needs  . Financial resource strain: Not on file  . Food insecurity:    Worry: Not on file    Inability: Not on file  . Transportation needs:    Medical: Not on file    Non-medical: Not on file  Tobacco Use  . Smoking status: Former Smoker    Packs/day: 2.50    Years: 7.00    Pack years: 17.50    Types: Cigarettes    Last attempt to quit: 03/18/1983    Years since quitting: 35.0  . Smokeless tobacco: Never Used  Substance and Sexual Activity  . Alcohol use: No    Alcohol/week: 0.0 standard drinks  . Drug use: No  . Sexual activity: Yes  Lifestyle  . Physical activity:    Days per week: Not on file    Minutes per session: Not on file  . Stress: Not on file  Relationships  . Social connections:    Talks on phone: Not on file    Gets together: Not on file    Attends religious service: Not on file    Active member of club or organization: Not on file    Attends meetings of clubs or organizations: Not on file    Relationship status: Not on file  . Intimate partner violence:    Fear of current or ex partner: Not on file    Emotionally abused: Not on file     Physically abused: Not on file    Forced sexual activity: Not  on file  Other Topics Concern  . Not on file  Social History Narrative   No living will or health care POA thus far   Wife, then son, should make decisions. May make son the primary person   Would accept resuscitation attempts but wouldn't want to be on machines   No tube feeds if cognitively unaware   Review of Systems No focal weakness, facial droop, aphasia, et No headaches Sleeping better---didn't tolerate the CPAP (uses xanax in daytime and ambien--discussed limiting these) Still with urinary fullness---no pain    Objective:   Physical Exam  Constitutional: No distress.  Neck: No thyromegaly present.  Cardiovascular: Normal rate, regular rhythm, normal heart sounds and intact distal pulses. Exam reveals no gallop.  No murmur heard. Respiratory: Breath sounds normal. No respiratory distress. He has no wheezes. He has no rales.  GI: Soft. There is no abdominal tenderness.  Musculoskeletal:        General: No tenderness or edema.  Lymphadenopathy:    He has no cervical adenopathy.  Skin:  No foot lesions  Psychiatric: He has a normal mood and affect. His behavior is normal.           Assessment & Plan:

## 2018-03-30 NOTE — Assessment & Plan Note (Signed)
Told it was related to poor BP control

## 2018-03-30 NOTE — Assessment & Plan Note (Signed)
I suspect he has microvascular changes Will work on lifestyle Consider further work up

## 2018-05-10 ENCOUNTER — Other Ambulatory Visit: Payer: Self-pay

## 2018-05-10 MED ORDER — ZOLPIDEM TARTRATE 10 MG PO TABS: 10.0000 mg | ORAL_TABLET | Freq: Every evening | ORAL | 0 refills | Status: DC | PRN

## 2018-05-10 MED ORDER — OLMESARTAN MEDOXOMIL-HCTZ 20-12.5 MG PO TABS: 1.0000 | ORAL_TABLET | Freq: Every day | ORAL | 11 refills | Status: DC

## 2018-05-10 NOTE — Telephone Encounter (Signed)
Last filled 03-19-18 #30 Last OV 03-30-18 Next OV 07-16-18 [facility]

## 2018-05-12 ENCOUNTER — Other Ambulatory Visit: Payer: Self-pay

## 2018-05-12 ENCOUNTER — Encounter (HOSPITAL_COMMUNITY): Payer: Self-pay

## 2018-05-12 ENCOUNTER — Emergency Department (HOSPITAL_COMMUNITY)
Admission: EM | Admit: 2018-05-12 | Discharge: 2018-05-13 | Disposition: A | Discharge status: Home / Self Care | Payer: Managed Care, Other (non HMO) | Attending: Emergency Medicine | Admitting: Emergency Medicine

## 2018-05-12 ENCOUNTER — Emergency Department (HOSPITAL_COMMUNITY): Payer: Managed Care, Other (non HMO)

## 2018-05-12 DIAGNOSIS — Z79899 Other long term (current) drug therapy: Secondary | ICD-10-CM | POA: Diagnosis not present

## 2018-05-12 DIAGNOSIS — Z87891 Personal history of nicotine dependence: Secondary | ICD-10-CM | POA: Insufficient documentation

## 2018-05-12 DIAGNOSIS — Y92018 Other place in single-family (private) house as the place of occurrence of the external cause: Secondary | ICD-10-CM | POA: Diagnosis not present

## 2018-05-12 DIAGNOSIS — E119 Type 2 diabetes mellitus without complications: Secondary | ICD-10-CM | POA: Diagnosis not present

## 2018-05-12 DIAGNOSIS — I110 Hypertensive heart disease with heart failure: Secondary | ICD-10-CM | POA: Diagnosis not present

## 2018-05-12 DIAGNOSIS — S99912A Unspecified injury of left ankle, initial encounter: Secondary | ICD-10-CM | POA: Diagnosis present

## 2018-05-12 DIAGNOSIS — Y939 Activity, unspecified: Secondary | ICD-10-CM | POA: Insufficient documentation

## 2018-05-12 DIAGNOSIS — I509 Heart failure, unspecified: Secondary | ICD-10-CM | POA: Insufficient documentation

## 2018-05-12 DIAGNOSIS — S93402A Sprain of unspecified ligament of left ankle, initial encounter: Secondary | ICD-10-CM | POA: Diagnosis not present

## 2018-05-12 DIAGNOSIS — W132XXA Fall from, out of or through roof, initial encounter: Secondary | ICD-10-CM | POA: Insufficient documentation

## 2018-05-12 DIAGNOSIS — Z7984 Long term (current) use of oral hypoglycemic drugs: Secondary | ICD-10-CM | POA: Diagnosis not present

## 2018-05-12 DIAGNOSIS — Y999 Unspecified external cause status: Secondary | ICD-10-CM | POA: Diagnosis not present

## 2018-05-12 DIAGNOSIS — Z7982 Long term (current) use of aspirin: Secondary | ICD-10-CM | POA: Diagnosis not present

## 2018-05-12 NOTE — ED Triage Notes (Signed)
Pt arrives POV for eval of L foot/ankle pain after it went through the ceiling in his attic. Pt reports it is painful to bear weight. No obvious deformity.

## 2018-05-13 NOTE — ED Provider Notes (Signed)
[facility] EMERGENCY DEPARTMENT Provider Note   CSN: [id number] Arrival date & time: 05/12/18  2308    History   Chief Complaint Chief Complaint  Patient presents with  . Foot Pain    HPI Alejandro Koch is a 68 y.o. male.     Patient presents to the emergency department with a chief complaint of left ankle pain.  He states that he was working in [employer], and as he was maneuvering, missed the trust with his foot and fell through the ceiling up to his left knee.  He complains of rolling his ankle when he went through the ceiling.  He states that he is able to ambulate, but it is painful.  Denies numbness, weakness, or tingling.  The history is provided by the patient. No language interpreter was used.    Past Medical History:  Diagnosis Date  . Allergy   . Anemia   . Blood transfusion without reported diagnosis    40 yrs ago   . BPH (benign prostatic hypertrophy)   . CHF (congestive heart failure) ([place])   . Depression   . Diabetes ([place])   . GERD (gastroesophageal reflux disease)   . Glaucoma   . Hyperlipidemia   . Hypertension   . Kidney stones   . Sleep apnea    no cpap     Patient Active Problem List   Diagnosis Date Noted  . Memory loss 03/30/2018  . Vitamin B12 deficiency 06/16/2017  . Fatigue 06/10/2017  . Acute prostatitis 06/10/2017  . Abdominal discomfort, epigastric 06/10/2017  . Neck pain on left side 06/25/2016  . Laryngopharyngeal reflux (LPR) 06/03/2016  . Anemia 07/23/2015  . DOE (dyspnea on exertion) 05/21/2015  . Preventative health care 05/12/2014  . Actinic keratoses 05/12/2014  . BPH with obstruction/lower urinary tract symptoms   . Fatty liver 03/01/2010  . Hypogonadism in male 09/19/2009  . Type 2 diabetes mellitus with neurological manifestations, uncontrolled ([place]) 03/05/2009  . Hyperlipemia 09/30/2006  . Mood disorder ([place]) 09/30/2006  . Essential hypertension, benign 09/30/2006  . ALLERGIC RHINITIS 09/30/2006    . GERD 09/30/2006  . OSA (obstructive sleep apnea) 09/30/2006    Past Surgical History:  Procedure Laterality Date  . CARDIAC CATHETERIZATION     [facility] - myocard perf and echo done as well - all normal- EF 48-55% 2019 April and june 2019  . COLONOSCOPY  2008  . FINGER SURGERY  2013   infection 2nd finger left hand  . POLYPECTOMY    . REPAIR FLEXOR TENDON HAND Right 08/2016        Home Medications    Prior to Admission medications   Medication Sig Start Date End Date Taking? Authorizing Provider  albuterol (PROVENTIL HFA;VENTOLIN HFA) 108 (90 Base) MCG/ACT inhaler Inhale into the lungs. 02/02/18 02/02/19  [provider]  ALPRAZolam Duanne Moron) 0.5 MG tablet TAKE 1 TABLET BY MOUTH THREE TIMES A DAY AS NEEDED 03/19/18   Viviana Simpler I, MD  aspirin 325 MG tablet Take 325 mg by mouth daily.    [provider]  aspirin 81 MG tablet Take 81 mg by mouth daily.    [provider]  atorvastatin (LIPITOR) 20 MG tablet TAKE 1 TABLET (20 MG TOTAL) BY MOUTH DAILY. 07/23/17   Viviana Simpler I, MD  Blood Glucose Monitoring Suppl (ONETOUCH VERIO FLEX SYSTEM) w/Device KIT USE TO CHECK BLOOD SUGAR 06/17/17   Venia Carbon, MD  BYDUREON 2 MG PEN INJECT 2 MG AS DIRECTED  ONCE A WEEK. 01/05/18   Viviana Simpler I, MD  cyclobenzaprine (FLEXERIL) 10 MG tablet TAKE 1 TABLET BY MOUTH 3 TIMES DAILY AS NEEDED FOR MUSCLE SPASMS WHEN NOT WORKING OR DRIVING 6/60/63   Venia Carbon, MD  esomeprazole (NEXIUM) 20 MG capsule Take 40 mg by mouth 2 (two) times daily before a meal.    [provider]  finasteride (PROSCAR) 5 MG tablet TAKE 1 TABLET (5 MG TOTAL) BY MOUTH DAILY. 08/24/17   Venia Carbon, MD  fluticasone furoate-vilanterol (BREO ELLIPTA) 100-25 MCG/INH AEPB Inhale into the lungs. 02/02/18   [provider]  glipiZIDE (GLUCOTROL XL) 5 MG 24 hr tablet Take 1 tablet (5 mg total) by mouth daily with breakfast. 09/14/17   Venia Carbon, MD   hydrocortisone-pramoxine [facility]) 2.5-1 % rectal cream Place 1 application rectally 3 (three) times daily as needed. 07/30/12   Ricard Dillon, MD  ketorolac Nancie Neas) 0.5 % ophthalmic solution USE AS DIRECTED 06/27/13   Ricard Dillon, MD  meloxicam (MOBIC) 15 MG tablet TAKE 1 TABLET (15 MG TOTAL) BY MOUTH DAILY. WITH A MEAL FOR NECK PAIN 01/04/18   Venia Carbon, MD  metFORMIN (GLUCOPHAGE-XR) 500 MG 24 hr tablet Take 2 tablets (1,000 mg total) by mouth daily with breakfast. 12/23/17   Venia Carbon, MD  Meth-Hyo-M Bl-Na Phos-Ph Sal (URO-MP) 118 MG CAPS Take 1 capsule by mouth every 6 (six) hours as needed. 05/09/16   [provider]  nitroGLYCERIN (NITROSTAT) 0.4 MG SL tablet Place under the tongue. 06/25/17 06/25/18  [provider]  olmesartan-hydrochlorothiazide (BENICAR HCT) 20-12.5 MG tablet Take 1 tablet by mouth daily. 05/10/18   Venia Carbon, MD  [facility] DELICA LANCETS FINE MISC 1 Units by Does not apply route daily. 07/26/15   Venia Carbon, MD  ONETOUCH VERIO test strip USE 1 STRIP DAILY TO CHECK BLOOD SUGAR 09/22/17   Venia Carbon, MD  tamsulosin (FLOMAX) 0.4 MG CAPS capsule TAKE 1 CAPSULE (0.4 MG TOTAL) BY MOUTH DAILY AFTER SUPPER. 08/06/17   Venia Carbon, MD  vitamin B-12 (CYANOCOBALAMIN) 1000 MCG tablet Take 1 tablet (1,000 mcg total) by mouth daily. 06/10/17   Ria Bush, MD  zolpidem (AMBIEN) 10 MG tablet Take 1 tablet (10 mg total) by mouth at bedtime as needed for sleep. 05/10/18   Venia Carbon, MD    Family History Family History  Problem Relation Age of Onset  . Alzheimer's disease Mother   . Heart disease Father   . COPD Father   . Colon cancer Father 65  . Prostate cancer Father   . Colon polyps Brother   . Leukemia Brother   . Leukemia Other   . Stomach cancer Neg Hx   . Esophageal cancer Neg Hx   . Rectal cancer Neg Hx     Social History Social History   Tobacco Use  . Smoking status: Former Smoker     Packs/day: 2.50    Years: 7.00    Pack years: 17.50    Types: Cigarettes    Last attempt to quit: 03/18/1983    Years since quitting: 35.1  . Smokeless tobacco: Never Used  Substance Use Topics  . Alcohol use: No    Alcohol/week: 0.0 standard drinks  . Drug use: No     Allergies   Iohexol; Atorvastatin; and Metformin and related   Review of Systems Review of Systems  All other systems reviewed and are negative.    Physical Exam  Updated Vital Signs BP (!) 161/87 (BP Location: Right Arm)   Pulse 100   Temp 98.1 F (36.7 C) (Oral)   Resp 16   Ht 6' 5"  (1.956 m)   Wt 122.5 kg   SpO2 92%   BMI 32.02 kg/m   Physical Exam Vitals signs and nursing note reviewed.  Constitutional:      Appearance: He is well-developed.  HENT:     Head: Normocephalic and atraumatic.  Eyes:     Conjunctiva/sclera: Conjunctivae normal.  Neck:     Musculoskeletal: Normal range of motion.  Cardiovascular:     Rate and Rhythm: Normal rate.     Comments: Intact distal pulses Pulmonary:     Effort: Pulmonary effort is normal.  Abdominal:     General: There is no distension.  Musculoskeletal: Normal range of motion.     Comments: Tenderness palpation about the left lateral ankle, no bony abnormality or deformity, range of motion strength 5/5, but with some pain at the endpoints  Skin:    General: Skin is dry.  Neurological:     Mental Status: He is alert and oriented to person, place, and time.  Psychiatric:        Behavior: Behavior normal.        Thought Content: Thought content normal.        Judgment: Judgment normal.      ED Treatments / Results  Labs (all labs ordered are listed, but only abnormal results are displayed) Labs Reviewed - No data to display  EKG None  Radiology Dg Ankle Complete Left  Result Date: 05/12/2018 CLINICAL DATA:  Left foot and ankle pain.  Foot went through ceiling EXAM: LEFT ANKLE COMPLETE - 3+ VIEW COMPARISON:  None. FINDINGS: There is no  evidence of fracture, dislocation, or joint effusion. There is no evidence of arthropathy or other focal bone abnormality. Soft tissues are unremarkable. IMPRESSION: Negative. Electronically Signed   By: Rolm Baptise M.D.   On: 05/12/2018 23:48   Dg Foot Complete Left  Result Date: 05/12/2018 CLINICAL DATA:  Foot went through ceiling.  Pain. EXAM: LEFT FOOT - COMPLETE 3+ VIEW COMPARISON:  None. FINDINGS: There is no evidence of fracture or dislocation. There is no evidence of arthropathy or other focal bone abnormality. Soft tissues are unremarkable. IMPRESSION: Negative. Electronically Signed   By: Rolm Baptise M.D.   On: 05/12/2018 23:48    Procedures Procedures (including critical care time)  Medications Ordered in ED Medications - No data to display   Initial Impression / Assessment and Plan / ED Course  I have reviewed the triage vital signs and the nursing notes.  Pertinent labs & imaging results that were available during my care of the patient were reviewed by me and considered in my medical decision making (see chart for details).        Patient presents with injury to left ankle.  DDx includes, fracture, strain, or sprain.  Consultants: none  Plain films reveal negative.  Pt advised to follow up with PCP and/or orthopedics. Patient given ASO and crutches while in ED, conservative therapy such as RICE recommended and discussed.   Patient will be discharged home & is agreeable with above plan. Returns precautions discussed. Pt appears safe for discharge.   Final Clinical Impressions(s) / ED Diagnoses   Final diagnoses:  Sprain of left ankle, unspecified ligament, initial encounter    ED Discharge Orders    None       Montine Circle, PA-C  05/13/18 0117    Fatima Blank, MD 05/13/18 [phone]

## 2018-05-19 ENCOUNTER — Telehealth: Payer: Self-pay

## 2018-05-19 NOTE — Telephone Encounter (Signed)
Spoke to pt. He said he is doing better 

## 2018-06-23 ENCOUNTER — Other Ambulatory Visit: Payer: Self-pay | Admitting: Internal Medicine

## 2018-07-07 ENCOUNTER — Telehealth: Payer: Self-pay | Admitting: Internal Medicine

## 2018-07-07 NOTE — Telephone Encounter (Signed)
I left a message on patient's voice mail to call back about 07/16/18 appointment.  Please change appointment to [email or web address].

## 2018-07-16 ENCOUNTER — Encounter: Payer: Self-pay | Admitting: Internal Medicine

## 2018-07-16 ENCOUNTER — Ambulatory Visit (INDEPENDENT_AMBULATORY_CARE_PROVIDER_SITE_OTHER): Payer: Managed Care, Other (non HMO) | Admitting: Internal Medicine

## 2018-07-16 VITALS — BP 136/76 | Ht 76.0 in | Wt 271.0 lb

## 2018-07-16 DIAGNOSIS — R053 Chronic cough: Secondary | ICD-10-CM

## 2018-07-16 DIAGNOSIS — Z0000 Encounter for general adult medical examination without abnormal findings: Secondary | ICD-10-CM | POA: Diagnosis not present

## 2018-07-16 DIAGNOSIS — IMO0002 Reserved for concepts with insufficient information to code with codable children: Secondary | ICD-10-CM

## 2018-07-16 DIAGNOSIS — G4733 Obstructive sleep apnea (adult) (pediatric): Secondary | ICD-10-CM

## 2018-07-16 DIAGNOSIS — N138 Other obstructive and reflux uropathy: Secondary | ICD-10-CM

## 2018-07-16 DIAGNOSIS — N401 Enlarged prostate with lower urinary tract symptoms: Secondary | ICD-10-CM

## 2018-07-16 DIAGNOSIS — E1149 Type 2 diabetes mellitus with other diabetic neurological complication: Secondary | ICD-10-CM | POA: Diagnosis not present

## 2018-07-16 DIAGNOSIS — E1165 Type 2 diabetes mellitus with hyperglycemia: Secondary | ICD-10-CM

## 2018-07-16 DIAGNOSIS — R05 Cough: Secondary | ICD-10-CM

## 2018-07-16 DIAGNOSIS — I10 Essential (primary) hypertension: Secondary | ICD-10-CM

## 2018-07-16 HISTORY — DX: Chronic cough: R05.3

## 2018-07-16 MED ORDER — HYDROCORTISONE 2.5 % EX CREA: TOPICAL_CREAM | Freq: Three times a day (TID) | CUTANEOUS | 3 refills | Status: DC | PRN

## 2018-07-16 NOTE — Assessment & Plan Note (Signed)
BP Readings from Last 3 Encounters:  07/16/18 136/76  05/13/18 (!) 142/97  03/30/18 116/70   Reasonable control Due for labs

## 2018-07-16 NOTE — Progress Notes (Signed)
Hearing Screening Comments: Tinnitus is getting worse.  Vision Screening Comments: October 2019

## 2018-07-16 NOTE — Assessment & Plan Note (Signed)
Recent colonoscopy--- 5 year recall Would defer PSA till next year (maybe just one more) Discussed increased exercise Flu vaccine in the fall

## 2018-07-16 NOTE — Assessment & Plan Note (Signed)
Still not compliant with lifestyle Doesn't want endocrine Will recheck

## 2018-07-16 NOTE — Progress Notes (Signed)
Subjective:    Patient ID: Alejandro Koch, male    DOB: 05/14/1950, 68 y.o.   MRN: [id number]  HPI Virtual visit for physical Identification done Reviewed billing and he gave consent He is home and I am in my office  Still doesn't have his strength back Seeing pulmonary doctor at [facility] Has been on antibiotics and prednisone Still coughs up clear phlegm Now on breo inhaler---he isn't sure it is helping Working trying to get in better shape--hard to exercise  Having left foot pain Mostly with walking New shoes didn't help---can't put pressure on it Overcompensates--so causing knee pain Using corn pad to keep pressure off it--- helps some  Sugars running 180-200 Knows he is "not eating like I am supposed to" He just hasn't worked on it Some numbness in toes--no pain other than the left foot spot Gets swelling at times  Current Outpatient Medications on File Prior to Visit  Medication Sig Dispense Refill  . albuterol (PROVENTIL HFA;VENTOLIN HFA) 108 (90 Base) MCG/ACT inhaler Inhale into the lungs.    . ALPRAZolam (XANAX) 0.5 MG tablet TAKE 1 TABLET BY MOUTH THREE TIMES A DAY AS NEEDED 90 tablet 0  . atorvastatin (LIPITOR) 20 MG tablet TAKE 1 TABLET (20 MG TOTAL) BY MOUTH DAILY. 90 tablet 3  . Blood Glucose Monitoring Suppl (ONETOUCH VERIO FLEX SYSTEM) w/Device KIT USE TO CHECK BLOOD SUGAR 1 kit 0  . BYDUREON 2 MG PEN INJECT 2 MG AS DIRECTED ONCE A WEEK. 4 each 12  . cyclobenzaprine (FLEXERIL) 10 MG tablet TAKE 1 TABLET BY MOUTH 3 TIMES DAILY AS NEEDED FOR MUSCLE SPASMS WHEN NOT WORKING OR DRIVING 30 tablet 0  . esomeprazole (NEXIUM) 20 MG capsule Take 40 mg by mouth 2 (two) times daily before a meal.    . finasteride (PROSCAR) 5 MG tablet TAKE 1 TABLET (5 MG TOTAL) BY MOUTH DAILY. 90 tablet 3  . fluticasone furoate-vilanterol (BREO ELLIPTA) 100-25 MCG/INH AEPB Inhale into the lungs.    Marland Kitchen glipiZIDE (GLUCOTROL XL) 5 MG 24 hr tablet Take 1 tablet (5 mg total) by mouth daily with  breakfast. 90 tablet 3  . hydrocortisone-pramoxine (ANALPRAM-HC) 2.5-1 % rectal cream Place 1 application rectally 3 (three) times daily as needed. 30 g 3  . ketorolac (ACULAR) 0.5 % ophthalmic solution USE AS DIRECTED 5 mL 1  . meloxicam (MOBIC) 15 MG tablet TAKE 1 TABLET (15 MG TOTAL) BY MOUTH DAILY. WITH A MEAL FOR NECK PAIN 30 tablet 5  . metFORMIN (GLUCOPHAGE-XR) 500 MG 24 hr tablet Take 2 tablets (1,000 mg total) by mouth daily with breakfast. 180 tablet 3  . Meth-Hyo-M Bl-Na Phos-Ph Sal (URO-MP) 118 MG CAPS Take 1 capsule by mouth every 6 (six) hours as needed.    Marland Kitchen olmesartan-hydrochlorothiazide (BENICAR HCT) 20-12.5 MG tablet Take 1 tablet by mouth daily. 30 tablet 11  . ONETOUCH DELICA LANCETS FINE MISC 1 Units by Does not apply route daily. 100 each 2  . ONETOUCH VERIO test strip USE 1 STRIP DAILY TO CHECK BLOOD SUGAR 100 each 3  . tamsulosin (FLOMAX) 0.4 MG CAPS capsule TAKE 1 CAPSULE (0.4 MG TOTAL) BY MOUTH DAILY AFTER SUPPER. 90 capsule 3  . vitamin B-12 (CYANOCOBALAMIN) 1000 MCG tablet Take 1 tablet (1,000 mcg total) by mouth daily.    Marland Kitchen zolpidem (AMBIEN) 10 MG tablet Take 1 tablet (10 mg total) by mouth at bedtime as needed for sleep. 30 tablet 0  . nitroGLYCERIN (NITROSTAT) 0.4 MG SL tablet Place under  the tongue.     No current facility-administered medications on file prior to visit.     Allergies  Allergen Reactions  . Iohexol      Code: HIVES, Desc: PT IS ALLERGIC TO IVP DYE AS NOTED IN PREVIOUS RECORDS IN PACS 02/2009/RM, Onset Date: [id number]   . Atorvastatin Other (See Comments)    Myalgias with this and rosuvastin  . Metformin And Related Diarrhea    Past Medical History:  Diagnosis Date  . Allergy   . Anemia   . Blood transfusion without reported diagnosis    40 yrs ago   . BPH (benign prostatic hypertrophy)   . CHF (congestive heart failure) ([place])   . Depression   . Diabetes ([place])   . GERD (gastroesophageal reflux disease)   . Glaucoma   .  Hyperlipidemia   . Hypertension   . Kidney stones   . Sleep apnea    no cpap     Past Surgical History:  Procedure Laterality Date  . CARDIAC CATHETERIZATION     [facility] - myocard perf and echo done as well - all normal- EF 48-55% 2019 April and june 2019  . COLONOSCOPY  2008  . FINGER SURGERY  2013   infection 2nd finger left hand  . POLYPECTOMY    . REPAIR FLEXOR TENDON HAND Right 08/2016    Family History  Problem Relation Age of Onset  . Alzheimer's disease Mother   . Heart disease Father   . COPD Father   . Colon cancer Father 87  . Prostate cancer Father   . Colon polyps Brother   . Leukemia Brother   . Leukemia Other   . Stomach cancer Neg Hx   . Esophageal cancer Neg Hx   . Rectal cancer Neg Hx     Social History   Socioeconomic History  . Marital status: Married    Spouse name: Not on file  . Number of children: 1  . Years of education: Not on file  . Highest education level: Not on file  Occupational History  . Occupation: Homeowners association management    Comment: retired  . Occupation: Adult nurse estate  Social Needs  . Financial resource strain: Not on file  . Food insecurity:    Worry: Not on file    Inability: Not on file  . Transportation needs:    Medical: Not on file    Non-medical: Not on file  Tobacco Use  . Smoking status: Former Smoker    Packs/day: 2.50    Years: 7.00    Pack years: 17.50    Types: Cigarettes    Last attempt to quit: 03/18/1983    Years since quitting: 35.3  . Smokeless tobacco: Never Used  Substance and Sexual Activity  . Alcohol use: No    Alcohol/week: 0.0 standard drinks  . Drug use: No  . Sexual activity: Yes  Lifestyle  . Physical activity:    Days per week: Not on file    Minutes per session: Not on file  . Stress: Not on file  Relationships  . Social connections:    Talks on phone: Not on file    Gets together: Not on file    Attends religious service: Not on file    Active member of club  or organization: Not on file    Attends meetings of clubs or organizations: Not on file    Relationship status: Not on file  . Intimate partner violence:    Fear  of current or ex partner: Not on file    Emotionally abused: Not on file    Physically abused: Not on file    Forced sexual activity: Not on file  Other Topics Concern  . Not on file  Social History Narrative   No living will or health care POA thus far   Wife, then son, should make decisions. May make son the primary person   Would accept resuscitation attempts but wouldn't want to be on machines   No tube feeds if cognitively unaware    Review of Systems  Constitutional: Positive for fatigue. Negative for unexpected weight change.       Wears seat belt inconsistently ---counseled  HENT: Positive for hearing loss and tinnitus. Negative for trouble swallowing.        Keeps up with dentist  Eyes: Negative for visual disturbance.       No diplopia or unilateral vision loss--needs check up  Respiratory: Positive for cough and shortness of breath.   Cardiovascular: Positive for palpitations and leg swelling. Negative for chest pain.       Just feels PVCs (skips)  Gastrointestinal: Negative for abdominal pain, blood in stool and constipation.       Gets heartburn if he misses nexium  Endocrine: Negative for polydipsia and polyuria.  Genitourinary: Positive for frequency.       Flow is okay---seems to empty okay Still slight burning sensation--did see urologist a year ago about this--no diagnosis  Musculoskeletal: Negative for arthralgias, back pain and joint swelling.  Skin: Positive for rash.       Preauricular scaliness--may be from his headset  Allergic/Immunologic: Negative for environmental allergies and immunocompromised state.  Neurological: Negative for dizziness, syncope, light-headedness and headaches.  Hematological: Negative for adenopathy. Bruises/bleeds easily.  Psychiatric/Behavioral: Positive for sleep  disturbance. Negative for dysphoric mood. The patient is not nervous/anxious.        Chronic sleep problems--uses the ambien/xanax Some better since selling businesses Still trying to reduced work schedule    Still has the CPAP---doesn't like it. Tries to use it regularly Appetite "too good" Has lost some weight     Objective:   Physical Exam  Constitutional: He appears well-developed. No distress.  Respiratory: Effort normal. No respiratory distress.  Musculoskeletal:        General: No edema.     Comments: No foot lesions Pain spot on left is near the distal base of 5th metatarsal  Psychiatric: He has a normal mood and affect. His behavior is normal.           Assessment & Plan:

## 2018-07-16 NOTE — Assessment & Plan Note (Signed)
Still with mild symptoms If worsens, will need to go back to the urologist

## 2018-07-16 NOTE — Assessment & Plan Note (Signed)
Fits definition of chronic bronchitis except not 2 years in a row Seeing the pulmonary doctor Doesn't feel the breo has helped

## 2018-07-16 NOTE — Assessment & Plan Note (Signed)
Trying to be more consistent with the CPAP

## 2018-07-19 ENCOUNTER — Other Ambulatory Visit (INDEPENDENT_AMBULATORY_CARE_PROVIDER_SITE_OTHER): Payer: Managed Care, Other (non HMO)

## 2018-07-19 ENCOUNTER — Encounter: Payer: Self-pay | Admitting: Family Medicine

## 2018-07-19 ENCOUNTER — Other Ambulatory Visit: Payer: Self-pay

## 2018-07-19 ENCOUNTER — Ambulatory Visit (INDEPENDENT_AMBULATORY_CARE_PROVIDER_SITE_OTHER): Payer: Managed Care, Other (non HMO) | Admitting: Family Medicine

## 2018-07-19 ENCOUNTER — Other Ambulatory Visit: Payer: Self-pay | Admitting: Internal Medicine

## 2018-07-19 VITALS — BP 100/60 | HR 93 | Temp 97.6°F | Ht 76.0 in | Wt 270.8 lb

## 2018-07-19 DIAGNOSIS — L84 Corns and callosities: Secondary | ICD-10-CM | POA: Diagnosis not present

## 2018-07-19 DIAGNOSIS — E1149 Type 2 diabetes mellitus with other diabetic neurological complication: Secondary | ICD-10-CM

## 2018-07-19 DIAGNOSIS — E1165 Type 2 diabetes mellitus with hyperglycemia: Secondary | ICD-10-CM | POA: Diagnosis not present

## 2018-07-19 DIAGNOSIS — IMO0002 Reserved for concepts with insufficient information to code with codable children: Secondary | ICD-10-CM

## 2018-07-19 DIAGNOSIS — M21622 Bunionette of left foot: Secondary | ICD-10-CM | POA: Diagnosis not present

## 2018-07-19 LAB — LIPID PANEL
Cholesterol: 237 mg/dL — ABNORMAL HIGH (ref 0–200)
HDL: 41.7 mg/dL (ref >39.00)
NonHDL: 195.69
Total CHOL/HDL Ratio: 6
Triglycerides: 219 mg/dL — ABNORMAL HIGH (ref 0.0–149.0)
VLDL: 43.8 mg/dL — ABNORMAL HIGH (ref 0.0–40.0)

## 2018-07-19 LAB — COMPREHENSIVE METABOLIC PANEL
ALT: 36 U/L (ref 0–53)
AST: 28 U/L (ref 0–37)
Albumin: 3.9 g/dL (ref 3.5–5.2)
Alkaline Phosphatase: 71 U/L (ref 39–117)
BUN: 17 mg/dL (ref 6–23)
CO2: 27 mEq/L (ref 19–32)
Calcium: 8.9 mg/dL (ref 8.4–10.5)
Chloride: 100 mEq/L (ref 96–112)
Creatinine, Ser: 1.06 mg/dL (ref 0.40–1.50)
GFR: 69.43 mL/min (ref >60.00)
Glucose, Bld: 225 mg/dL — ABNORMAL HIGH (ref 70–99)
Potassium: 4.1 mEq/L (ref 3.5–5.1)
Sodium: 137 mEq/L (ref 135–145)
Total Bilirubin: 0.7 mg/dL (ref 0.2–1.2)
Total Protein: 6.7 g/dL (ref 6.0–8.3)

## 2018-07-19 LAB — CBC
HCT: 37.3 % — ABNORMAL LOW (ref 39.0–52.0)
Hemoglobin: 12.9 g/dL — ABNORMAL LOW (ref 13.0–17.0)
MCHC: 34.7 g/dL (ref 30.0–36.0)
MCV: 86.2 fl (ref 78.0–100.0)
Platelets: 233 10*3/uL (ref 150.0–400.0)
RBC: 4.32 Mil/uL (ref 4.22–5.81)
RDW: 13.6 % (ref 11.5–15.5)
WBC: 5.2 10*3/uL (ref 4.0–10.5)

## 2018-07-19 LAB — LDL CHOLESTEROL, DIRECT: Direct LDL: 177 mg/dL

## 2018-07-19 LAB — HEMOGLOBIN A1C: Hgb A1c MFr Bld: 9.7 % — ABNORMAL HIGH (ref 4.6–6.5)

## 2018-07-19 MED ORDER — METFORMIN HCL ER 500 MG PO TB24: 1500.0000 mg | ORAL_TABLET | Freq: Every day | ORAL | 3 refills | Status: DC

## 2018-07-19 MED ORDER — GLIPIZIDE ER 10 MG PO TB24: 10.0000 mg | ORAL_TABLET | Freq: Every day | ORAL | 3 refills | Status: DC

## 2018-07-19 MED ORDER — UREA 40 % EX CREA: TOPICAL_CREAM | CUTANEOUS | 1 refills | Status: DC

## 2018-07-19 NOTE — Progress Notes (Signed)
Mateen Franssen T. Damarco Keysor, MD Primary Care and [place] at [facility] [place], [address] Phone: [phone]  FAX: [place] - 68 y.o. male  MRN [id number]  Date of Birth: 1951/02/18  Visit Date: 07/19/2018  PCP: Venia Carbon, MD  Referred by: Venia Carbon, MD  Chief Complaint  Patient presents with  . Foot Pain    Left   Subjective:   Alejandro Koch is a 68 y.o. very pleasant male patient who presents with the following:  L bunionette and corn: He has been having some pain at his metatarsal head on the fifth.  He does have a bunionette here, and he has some significantly hard, painful tissue.  Past Medical History, Surgical History, Social History, Family History, Problem List, Medications, and Allergies have been reviewed and updated if relevant.  Patient Active Problem List   Diagnosis Date Noted  . Chronic cough 07/16/2018  . Memory loss 03/30/2018  . Vitamin B12 deficiency 06/16/2017  . Fatigue 06/10/2017  . Acute prostatitis 06/10/2017  . Abdominal discomfort, epigastric 06/10/2017  . Laryngopharyngeal reflux (LPR) 06/03/2016  . Anemia 07/23/2015  . DOE (dyspnea on exertion) 05/21/2015  . Preventative health care 05/12/2014  . Actinic keratoses 05/12/2014  . BPH with obstruction/lower urinary tract symptoms   . Fatty liver 03/01/2010  . Hypogonadism in male 09/19/2009  . Type 2 diabetes mellitus with neurological manifestations, uncontrolled ([place]) 03/05/2009  . Hyperlipemia 09/30/2006  . Mood disorder ([place]) 09/30/2006  . Essential hypertension, benign 09/30/2006  . ALLERGIC RHINITIS 09/30/2006  . GERD 09/30/2006  . OSA (obstructive sleep apnea) 09/30/2006    Past Medical History:  Diagnosis Date  . Allergy   . Anemia   . Blood transfusion without reported diagnosis    40 yrs ago   . BPH (benign prostatic hypertrophy)   . CHF (congestive heart failure) ([place])   .  Depression   . Diabetes ([place])   . GERD (gastroesophageal reflux disease)   . Glaucoma   . Hyperlipidemia   . Hypertension   . Kidney stones   . Sleep apnea    no cpap     Past Surgical History:  Procedure Laterality Date  . CARDIAC CATHETERIZATION     [facility] - myocard perf and echo done as well - all normal- EF 48-55% 2019 April and june 2019  . COLONOSCOPY  2008  . FINGER SURGERY  2013   infection 2nd finger left hand  . POLYPECTOMY    . REPAIR FLEXOR TENDON HAND Right 08/2016    Social History   Socioeconomic History  . Marital status: Married    Spouse name: Not on file  . Number of children: 1  . Years of education: Not on file  . Highest education level: Not on file  Occupational History  . Occupation: Homeowners association management    Comment: retired  . Occupation: Adult nurse estate  Social Needs  . Financial resource strain: Not on file  . Food insecurity:    Worry: Not on file    Inability: Not on file  . Transportation needs:    Medical: Not on file    Non-medical: Not on file  Tobacco Use  . Smoking status: Former Smoker    Packs/day: 2.50    Years: 7.00    Pack years: 17.50    Types: Cigarettes    Last attempt to quit: 03/18/1983    Years since quitting:  35.3  . Smokeless tobacco: Never Used  Substance and Sexual Activity  . Alcohol use: No    Alcohol/week: 0.0 standard drinks  . Drug use: No  . Sexual activity: Yes  Lifestyle  . Physical activity:    Days per week: Not on file    Minutes per session: Not on file  . Stress: Not on file  Relationships  . Social connections:    Talks on phone: Not on file    Gets together: Not on file    Attends religious service: Not on file    Active member of club or organization: Not on file    Attends meetings of clubs or organizations: Not on file    Relationship status: Not on file  . Intimate partner violence:    Fear of current or ex partner: Not on file    Emotionally abused: Not on file     Physically abused: Not on file    Forced sexual activity: Not on file  Other Topics Concern  . Not on file  Social History Narrative   No living will or health care POA thus far   Wife, then son, should make decisions. May make son the primary person   Would accept resuscitation attempts but wouldn't want to be on machines   No tube feeds if cognitively unaware    Family History  Problem Relation Age of Onset  . Alzheimer's disease Mother   . Heart disease Father   . COPD Father   . Colon cancer Father 31  . Prostate cancer Father   . Colon polyps Brother   . Leukemia Brother   . Leukemia Other   . Stomach cancer Neg Hx   . Esophageal cancer Neg Hx   . Rectal cancer Neg Hx     Allergies  Allergen Reactions  . Iohexol      Code: HIVES, Desc: PT IS ALLERGIC TO IVP DYE AS NOTED IN PREVIOUS RECORDS IN PACS 02/2009/RM, Onset Date: 60109323   . Atorvastatin Other (See Comments)    Myalgias with this and rosuvastin  . Metformin And Related Diarrhea    Medication list reviewed and updated in full in [place].  GEN: No fevers, chills. Nontoxic. Primarily MSK c/o today. MSK: Detailed in the HPI GI: tolerating PO intake without difficulty Neuro: No numbness, parasthesias, or tingling associated. Otherwise the pertinent positives of the ROS are noted above.   Objective:   BP 100/60   Pulse 93   Temp 97.6 F (36.4 C) (Oral)   Ht 6' 4"  (1.93 m)   Wt 270 lb 12 oz (122.8 kg)   BMI 32.96 kg/m    GEN: WDWN, NAD, Non-toxic, Alert & Oriented x 3 HEENT: Atraumatic, Normocephalic.  Ears and Nose: No external deformity. EXTR: No clubbing/cyanosis/edema NEURO: Normal gait.  PSYCH: Normally interactive. Conversant. Not depressed or anxious appearing.  Calm demeanor.    Forefoot with significant bunionette and his fifth to toe is going underneath his fourth toe at an angle.  He does have corn and callus formation at the fifth.  Radiology: No results found.   Assessment and Plan:   Corn of foot  Tailor's bunion of left foot  Patient Instructions  Corn and Callus Removal  Use the urea 40% cream twice a day to the hardened area below your pinky toe. You only need a small amount - like about a pea-sized amount to cover that area.  Do that for 1 week straight and then keep  doing it twice a day until soft and not painful. You can use a corn pad for now, too.  After 1 week, get a PUMICE STONE.  You can get this from any pharmacy - use it twice a day to scrape away some of the softened skin in this area.  This should work gently and over time.    Follow-up: No follow-ups on file.  Meds ordered this encounter  Medications  . urea (CARMOL) 40 % CREA    Sig: Apply pea sized amount to hard area below pinky toe twice a day    Dispense:  28.3 each    Refill:  1   No orders of the defined types were placed in this encounter.   Signed,  Maud Deed. Quynn Vilchis, MD   Outpatient Encounter Medications as of 07/19/2018  Medication Sig  . albuterol (PROVENTIL HFA;VENTOLIN HFA) 108 (90 Base) MCG/ACT inhaler Inhale into the lungs.  . ALPRAZolam (XANAX) 0.5 MG tablet TAKE 1 TABLET BY MOUTH THREE TIMES A DAY AS NEEDED  . atorvastatin (LIPITOR) 20 MG tablet TAKE 1 TABLET (20 MG TOTAL) BY MOUTH DAILY.  Marland Kitchen Blood Glucose Monitoring Suppl (ONETOUCH VERIO FLEX SYSTEM) w/Device KIT USE TO CHECK BLOOD SUGAR  . BYDUREON 2 MG PEN INJECT 2 MG AS DIRECTED ONCE A WEEK.  . cyclobenzaprine (FLEXERIL) 10 MG tablet TAKE 1 TABLET BY MOUTH 3 TIMES DAILY AS NEEDED FOR MUSCLE SPASMS WHEN NOT WORKING OR DRIVING  . esomeprazole (NEXIUM) 20 MG capsule Take 40 mg by mouth 2 (two) times daily before a meal.  . finasteride (PROSCAR) 5 MG tablet TAKE 1 TABLET (5 MG TOTAL) BY MOUTH DAILY.  . fluticasone furoate-vilanterol (BREO ELLIPTA) 100-25 MCG/INH AEPB Inhale into the lungs.  Marland Kitchen glipiZIDE (GLUCOTROL XL) 5 MG 24 hr tablet Take 1 tablet (5 mg total) by mouth daily with breakfast.   . hydrocortisone 2.5 % cream Apply topically 3 (three) times daily as needed.  . hydrocortisone-pramoxine (ANALPRAM-HC) 2.5-1 % rectal cream Place 1 application rectally 3 (three) times daily as needed.  Marland Kitchen ketorolac (ACULAR) 0.5 % ophthalmic solution USE AS DIRECTED  . meloxicam (MOBIC) 15 MG tablet TAKE 1 TABLET (15 MG TOTAL) BY MOUTH DAILY. WITH A MEAL FOR NECK PAIN  . metFORMIN (GLUCOPHAGE-XR) 500 MG 24 hr tablet Take 2 tablets (1,000 mg total) by mouth daily with breakfast.  . Meth-Hyo-M Bl-Na Phos-Ph Sal (URO-MP) 118 MG CAPS Take 1 capsule by mouth every 6 (six) hours as needed.  Marland Kitchen olmesartan-hydrochlorothiazide (BENICAR HCT) 20-12.5 MG tablet Take 1 tablet by mouth daily.  Glory Rosebush DELICA LANCETS FINE MISC 1 Units by Does not apply route daily.  Glory Rosebush VERIO test strip USE 1 STRIP DAILY TO CHECK BLOOD SUGAR  . vitamin B-12 (CYANOCOBALAMIN) 1000 MCG tablet Take 1 tablet (1,000 mcg total) by mouth daily.  Marland Kitchen zolpidem (AMBIEN) 10 MG tablet Take 1 tablet (10 mg total) by mouth at bedtime as needed for sleep.  . [DISCONTINUED] tamsulosin (FLOMAX) 0.4 MG CAPS capsule TAKE 1 CAPSULE (0.4 MG TOTAL) BY MOUTH DAILY AFTER SUPPER.  . nitroGLYCERIN (NITROSTAT) 0.4 MG SL tablet Place under the tongue.  . urea (CARMOL) 40 % CREA Apply pea sized amount to hard area below pinky toe twice a day   No facility-administered encounter medications on file as of 07/19/2018.

## 2018-07-19 NOTE — Patient Instructions (Signed)
Corn and Callus Removal  Use the urea 40% cream twice a day to the hardened area below your pinky toe. You only need a small amount - like about a pea-sized amount to cover that area.  Do that for 1 week straight and then keep doing it twice a day until soft and not painful. You can use a corn pad for now, too.  After 1 week, get a PUMICE STONE.  You can get this from any pharmacy - use it twice a day to scrape away some of the softened skin in this area.  This should work gently and over time.

## 2018-07-19 NOTE — Addendum Note (Signed)
Addended by: Viviana Simpler I on: 07/19/2018 01:35 PM   Modules accepted: Orders

## 2018-07-26 ENCOUNTER — Telehealth: Payer: Self-pay

## 2018-07-26 NOTE — Telephone Encounter (Signed)
Change to glipizide 10mg  daily and increase the metformin to 3 tabs (1500mg ) daily --if his intestines will tolerated.  Pt said he is having a little trouble with Metformin right now but not too bad.

## 2018-08-09 ENCOUNTER — Other Ambulatory Visit: Payer: Self-pay | Admitting: Internal Medicine

## 2018-08-10 NOTE — Telephone Encounter (Signed)
Alprazolam last filled 03-19-18 #90 Zolpidem last filled 05-10-18 #30 Last OV 07-16-18 Next OV 01-18-19 [facility]

## 2018-08-24 ENCOUNTER — Other Ambulatory Visit: Payer: Self-pay | Admitting: Internal Medicine

## 2018-08-24 ENCOUNTER — Other Ambulatory Visit: Payer: Self-pay | Admitting: Family Medicine

## 2018-09-16 ENCOUNTER — Other Ambulatory Visit: Payer: Self-pay | Admitting: Internal Medicine

## 2018-09-22 ENCOUNTER — Other Ambulatory Visit: Payer: Self-pay | Admitting: Internal Medicine

## 2018-09-23 NOTE — Telephone Encounter (Signed)
Alprazolam last filled 08-10-18 #90 Zolpidem last filled 08-10-18 #30 Last OV 07-19-18 Next OV 01-18-19 [facility]

## 2018-10-26 ENCOUNTER — Other Ambulatory Visit: Payer: Self-pay | Admitting: Internal Medicine

## 2018-10-27 NOTE — Telephone Encounter (Signed)
Both last filled 09-23-18 Alprazolam #90 Zolpidem #30 Last OV 08-08-18 Next OV 01-18-19 [facility]

## 2018-10-28 ENCOUNTER — Other Ambulatory Visit: Payer: Self-pay | Admitting: Internal Medicine

## 2018-12-09 ENCOUNTER — Other Ambulatory Visit: Payer: Self-pay | Admitting: Internal Medicine

## 2018-12-09 NOTE — Telephone Encounter (Signed)
Last filled 10-27-18 #30 Last OV 07-19-18 Next OV 01-18-19 [facility]

## 2018-12-12 ENCOUNTER — Other Ambulatory Visit: Payer: Self-pay | Admitting: Internal Medicine

## 2018-12-27 DIAGNOSIS — N138 Other obstructive and reflux uropathy: Secondary | ICD-10-CM | POA: Insufficient documentation

## 2018-12-27 DIAGNOSIS — N401 Enlarged prostate with lower urinary tract symptoms: Secondary | ICD-10-CM

## 2018-12-27 HISTORY — DX: Benign prostatic hyperplasia with lower urinary tract symptoms: N40.1

## 2018-12-27 HISTORY — DX: Benign prostatic hyperplasia with lower urinary tract symptoms: N13.8

## 2019-01-02 ENCOUNTER — Other Ambulatory Visit: Payer: Self-pay | Admitting: Internal Medicine

## 2019-01-14 ENCOUNTER — Other Ambulatory Visit: Payer: Self-pay | Admitting: Internal Medicine

## 2019-01-14 NOTE — Telephone Encounter (Signed)
Alprazolam last filled 10-27-18 #90 Zolpidem last filled 12-09-18 #30 Last OV 07-16-18 Next OV 01-18-19 [facility]

## 2019-01-18 ENCOUNTER — Encounter: Payer: Self-pay | Admitting: Internal Medicine

## 2019-01-18 ENCOUNTER — Other Ambulatory Visit: Payer: Self-pay

## 2019-01-18 ENCOUNTER — Ambulatory Visit: Payer: Managed Care, Other (non HMO) | Admitting: Internal Medicine

## 2019-01-18 VITALS — BP 120/60 | HR 90 | Ht 76.0 in | Wt 266.0 lb

## 2019-01-18 DIAGNOSIS — B070 Plantar wart: Secondary | ICD-10-CM | POA: Insufficient documentation

## 2019-01-18 DIAGNOSIS — IMO0002 Reserved for concepts with insufficient information to code with codable children: Secondary | ICD-10-CM

## 2019-01-18 DIAGNOSIS — R413 Other amnesia: Secondary | ICD-10-CM

## 2019-01-18 DIAGNOSIS — E1165 Type 2 diabetes mellitus with hyperglycemia: Secondary | ICD-10-CM | POA: Diagnosis not present

## 2019-01-18 DIAGNOSIS — Z23 Encounter for immunization: Secondary | ICD-10-CM

## 2019-01-18 DIAGNOSIS — K760 Fatty (change of) liver, not elsewhere classified: Secondary | ICD-10-CM

## 2019-01-18 DIAGNOSIS — E1149 Type 2 diabetes mellitus with other diabetic neurological complication: Secondary | ICD-10-CM | POA: Diagnosis not present

## 2019-01-18 DIAGNOSIS — I10 Essential (primary) hypertension: Secondary | ICD-10-CM | POA: Diagnosis not present

## 2019-01-18 LAB — HM DIABETES FOOT EXAM

## 2019-01-18 LAB — POCT GLYCOSYLATED HEMOGLOBIN (HGB A1C): Hemoglobin A1C: 9.1 % — AB (ref 4.0–5.6)

## 2019-01-18 MED ORDER — DAPAGLIFLOZIN PROPANEDIOL 5 MG PO TABS: 5.0000 mg | ORAL_TABLET | Freq: Every day | ORAL | 11 refills | Status: DC

## 2019-01-18 NOTE — Assessment & Plan Note (Signed)
LFTs normal Still needs more weight loss

## 2019-01-18 NOTE — Addendum Note (Signed)
Addended by: Pilar Grammes on: 01/18/2019 12:00 PM   Modules accepted: Orders

## 2019-01-18 NOTE — Progress Notes (Signed)
Subjective:    Patient ID: ISABELLA IDA, male    DOB: 07-29-1950, 68 y.o.   MRN: [id number]  HPI Here for follow up of diabetes  Has been trying harder to eat right Eating less and cutting back on sugars Still 180-220 Did increase his meds Hasn't been able to exercise--has a place on his foot that limits him (callous with wart?) Still with numbness--especially in toes. Not really painful (more on left)  Not sleeping well despite xanax and ambien Tried off the ambien---really couldn't sleep (maybe 4 hours with it)  Has some concerns about his memory Forgetting "simple things"---keeping up with daily tasks, remembering what he did yesterday, etc  Current Outpatient Medications on File Prior to Visit  Medication Sig Dispense Refill  . albuterol (PROVENTIL HFA;VENTOLIN HFA) 108 (90 Base) MCG/ACT inhaler Inhale into the lungs.    . ALPRAZolam (XANAX) 0.5 MG tablet TAKE 1 TABLET BY MOUTH THREE TIMES A DAY AS NEEDED 90 tablet 0  . atorvastatin (LIPITOR) 20 MG tablet TAKE 1 TABLET (20 MG TOTAL) BY MOUTH DAILY. 90 tablet 3  . Blood Glucose Monitoring Suppl (ONETOUCH VERIO FLEX SYSTEM) w/Device KIT USE TO CHECK BLOOD SUGAR 1 kit 0  . BYDUREON 2 MG PEN INJECT 2 MG AS DIRECTED ONCE A WEEK. 4 each 12  . cyclobenzaprine (FLEXERIL) 10 MG tablet TAKE 1 TABLET BY MOUTH 3 TIMES DAILY AS NEEDED FOR MUSCLE SPASMS WHEN NOT WORKING OR DRIVING 30 tablet 0  . esomeprazole (NEXIUM) 20 MG capsule Take 40 mg by mouth 2 (two) times daily before a meal.    . finasteride (PROSCAR) 5 MG tablet TAKE 1 TABLET (5 MG TOTAL) BY MOUTH DAILY. 90 tablet 3  . fluticasone furoate-vilanterol (BREO ELLIPTA) 100-25 MCG/INH AEPB Inhale into the lungs.    Marland Kitchen glipiZIDE (GLUCOTROL XL) 10 MG 24 hr tablet Take 1 tablet (10 mg total) by mouth daily with breakfast. 90 tablet 3  . hydrocortisone 2.5 % cream APPLY TOPICALLY 3 (THREE) TIMES DAILY AS NEEDED. 28.35 g 3  . hydrocortisone-pramoxine (ANALPRAM-HC) 2.5-1 % rectal cream Place  1 application rectally 3 (three) times daily as needed. 30 g 3  . ketorolac (ACULAR) 0.5 % ophthalmic solution USE AS DIRECTED 5 mL 1  . meloxicam (MOBIC) 15 MG tablet TAKE 1 TABLET (15 MG TOTAL) BY MOUTH DAILY. WITH A MEAL FOR NECK PAIN 30 tablet 5  . metFORMIN (GLUCOPHAGE-XR) 500 MG 24 hr tablet Take 3 tablets (1,500 mg total) by mouth daily with breakfast. 270 tablet 3  . Meth-Hyo-M Bl-Na Phos-Ph Sal (URO-MP) 118 MG CAPS Take 1 capsule by mouth every 6 (six) hours as needed.    Marland Kitchen olmesartan-hydrochlorothiazide (BENICAR HCT) 20-12.5 MG tablet Take 1 tablet by mouth daily. 30 tablet 11  . ONETOUCH DELICA LANCETS FINE MISC 1 Units by Does not apply route daily. 100 each 2  . ONETOUCH VERIO test strip USE 1 STRIP DAILY TO CHECK BLOOD SUGAR 50 strip 11  . tamsulosin (FLOMAX) 0.4 MG CAPS capsule TAKE 1 CAPSULE (0.4 MG TOTAL) BY MOUTH DAILY AFTER SUPPER. 90 capsule 3  . urea (CARMOL) 40 % CREA APPLY PEA SIZED AMOUNT TO HARD AREA BELOW PINKY TOE TWICE A DAY 28.35 g 1  . vitamin B-12 (CYANOCOBALAMIN) 1000 MCG tablet Take 1 tablet (1,000 mcg total) by mouth daily.    Marland Kitchen zolpidem (AMBIEN) 10 MG tablet TAKE 1 TABLET BY MOUTH AT BEDTIME AS NEEDED FOR SLEEP. 30 tablet 0  . nitroGLYCERIN (NITROSTAT) 0.4 MG SL  tablet Place under the tongue.     No current facility-administered medications on file prior to visit.     Allergies  Allergen Reactions  . Iohexol      Code: HIVES, Desc: PT IS ALLERGIC TO IVP DYE AS NOTED IN PREVIOUS RECORDS IN PACS 02/2009/RM, Onset Date: [id number]   . Atorvastatin Other (See Comments)    Myalgias with this and rosuvastin  . Metformin And Related Diarrhea    Past Medical History:  Diagnosis Date  . Allergy   . Anemia   . Blood transfusion without reported diagnosis    40 yrs ago   . BPH (benign prostatic hypertrophy)   . CHF (congestive heart failure) ([place])   . Depression   . Diabetes ([place])   . GERD (gastroesophageal reflux disease)   . Glaucoma   . Hyperlipidemia    . Hypertension   . Kidney stones   . Sleep apnea    no cpap     Past Surgical History:  Procedure Laterality Date  . CARDIAC CATHETERIZATION     [facility] - myocard perf and echo done as well - all normal- EF 48-55% 2019 April and june 2019  . COLONOSCOPY  2008  . FINGER SURGERY  2013   infection 2nd finger left hand  . POLYPECTOMY    . REPAIR FLEXOR TENDON HAND Right 08/2016    Family History  Problem Relation Age of Onset  . Alzheimer's disease Mother   . Heart disease Father   . COPD Father   . Colon cancer Father 40  . Prostate cancer Father   . Colon polyps Brother   . Leukemia Brother   . Leukemia Other   . Stomach cancer Neg Hx   . Esophageal cancer Neg Hx   . Rectal cancer Neg Hx     Social History   Socioeconomic History  . Marital status: Married    Spouse name: Not on file  . Number of children: 1  . Years of education: Not on file  . Highest education level: Not on file  Occupational History  . Occupation: Homeowners association management    Comment: retired  . Occupation: Adult nurse estate  Social Needs  . Financial resource strain: Not on file  . Food insecurity    Worry: Not on file    Inability: Not on file  . Transportation needs    Medical: Not on file    Non-medical: Not on file  Tobacco Use  . Smoking status: Former Smoker    Packs/day: 2.50    Years: 7.00    Pack years: 17.50    Types: Cigarettes    Quit date: 03/18/1983    Years since quitting: 35.8  . Smokeless tobacco: Never Used  Substance and Sexual Activity  . Alcohol use: No    Alcohol/week: 0.0 standard drinks  . Drug use: No  . Sexual activity: Yes  Lifestyle  . Physical activity    Days per week: Not on file    Minutes per session: Not on file  . Stress: Not on file  Relationships  . Social Herbalist on phone: Not on file    Gets together: Not on file    Attends religious service: Not on file    Active member of club or organization: Not on file     Attends meetings of clubs or organizations: Not on file    Relationship status: Not on file  . Intimate partner violence  Fear of current or ex partner: Not on file    Emotionally abused: Not on file    Physically abused: Not on file    Forced sexual activity: Not on file  Other Topics Concern  . Not on file  Social History Narrative   No living will or health care POA thus far   Wife, then son, should make decisions. May make son the primary person   Would accept resuscitation attempts but wouldn't want to be on machines   No tube feeds if cognitively unaware   Review of Systems Appetite is okay Weight down slightly    Objective:   Physical Exam  Constitutional: He appears well-developed. No distress.  Neck: No thyromegaly present.  Cardiovascular: Normal rate, regular rhythm, normal heart sounds and intact distal pulses. Exam reveals no gallop.  No murmur heard. Respiratory: Effort normal and breath sounds normal. No respiratory distress. He has no wheezes. He has no rales.  Musculoskeletal:        General: No edema.  Lymphadenopathy:    He has no cervical adenopathy.  Neurological:  Decreased sensation in feet  Skin:  32m plantar wart at base of left 5th toe No other foot lesions           Assessment & Plan:

## 2019-01-18 NOTE — Assessment & Plan Note (Signed)
Discussed options Verbal consent Pared callous with scalpel Liquid nitrogen 45 seconds x 2  Discussed home care

## 2019-01-18 NOTE — Assessment & Plan Note (Signed)
I think this is related to the sleep disorder and Lorrin Mais Asked him to stop and try melatonin instead May want to consider neurology evaluation

## 2019-01-18 NOTE — Assessment & Plan Note (Signed)
BP Readings from Last 3 Encounters:  01/18/19 120/60  07/19/18 100/60  07/16/18 136/76   Good control

## 2019-01-18 NOTE — Assessment & Plan Note (Signed)
Lab Results  Component Value Date   HGBA1C 9.1 (A) 01/18/2019   Better but still not good Needs to still work on lifestyle Still prefers no endocrine Will add farxiga

## 2019-02-25 ENCOUNTER — Other Ambulatory Visit: Payer: Self-pay

## 2019-02-28 MED ORDER — ALPRAZOLAM 0.5 MG PO TABS: 0.5000 mg | ORAL_TABLET | Freq: Three times a day (TID) | ORAL | 0 refills | Status: DC | PRN

## 2019-02-28 MED ORDER — ONETOUCH VERIO FLEX SYSTEM W/DEVICE KIT: 1.0000 | PACK | Freq: Once | 0 refills | Status: DC

## 2019-02-28 NOTE — Telephone Encounter (Signed)
Last filled 01-14-19 #90 Last OV 01-18-19 Next OV 04-20-19 [facility]

## 2019-03-04 ENCOUNTER — Telehealth: Payer: Self-pay

## 2019-03-04 DIAGNOSIS — L989 Disorder of the skin and subcutaneous tissue, unspecified: Secondary | ICD-10-CM

## 2019-03-04 NOTE — Telephone Encounter (Signed)
Spoke to pt. He said it is on the top of his earlobe around the back. The ophthalmologist told him it could be a Basal Cell Carcinoma.

## 2019-03-04 NOTE — Telephone Encounter (Signed)
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 Pt request referral and cb. Pt had diabetes FU 01/18/19.

## 2019-03-04 NOTE — Telephone Encounter (Signed)
I am sorry to hear about the problems I will need more information before I know what type of referral is needed. Is this in the ear (like behind the eardrum0? Or brain? Or on scalp? Has there been some imaging test done?

## 2019-03-05 NOTE — Telephone Encounter (Signed)
Okay Less worrisome than what I was imagining Will put in referral to dermatology

## 2019-04-20 ENCOUNTER — Ambulatory Visit: Payer: Managed Care, Other (non HMO) | Admitting: Internal Medicine

## 2019-04-23 ENCOUNTER — Other Ambulatory Visit: Payer: Self-pay | Admitting: Internal Medicine

## 2019-04-25 NOTE — Telephone Encounter (Signed)
Alprazolam last filled 02-28-19 #90 Zolpidem last filled 01-14-19 #30 Last OV 01-18-19 Next OV 04-27-19 [facility]

## 2019-04-27 ENCOUNTER — Ambulatory Visit: Payer: Managed Care, Other (non HMO) | Admitting: Internal Medicine

## 2019-04-27 ENCOUNTER — Encounter: Payer: Self-pay | Admitting: Internal Medicine

## 2019-04-27 ENCOUNTER — Other Ambulatory Visit: Payer: Self-pay

## 2019-04-27 DIAGNOSIS — E1165 Type 2 diabetes mellitus with hyperglycemia: Secondary | ICD-10-CM

## 2019-04-27 DIAGNOSIS — IMO0002 Reserved for concepts with insufficient information to code with codable children: Secondary | ICD-10-CM

## 2019-04-27 DIAGNOSIS — E1149 Type 2 diabetes mellitus with other diabetic neurological complication: Secondary | ICD-10-CM | POA: Diagnosis not present

## 2019-04-27 LAB — POCT GLYCOSYLATED HEMOGLOBIN (HGB A1C): Hemoglobin A1C: 8.2 % — AB (ref 4.0–5.6)

## 2019-04-27 NOTE — Progress Notes (Signed)
Subjective:    Patient ID: Alejandro Koch, male    DOB: [birth year], 69 y.o.   MRN: [id number]  HPI Here for review of uncontrolled diabetes This visit occurred during the SARS-CoV-2 public health emergency.  Safety protocols were in place, including screening questions prior to the visit, additional usage of staff PPE, and extensive cleaning of exam room while observing appropriate contact time as indicated for disinfecting solutions.   Was able to afford farxiga---with insurance help Taking it for about a month Sugars still running ~160-230 Rarely under 100 No change in foot numbness  Saw cardiologist at [facility] due to [place] (also Dr Ubaldo Glassing) Kristeen Miss to be respiratory Seen by pulmonologist--got prednisone and breo (but not taking regularly) No sig chest pain---just fleeting left chest pain at times  Current Outpatient Medications on File Prior to Visit  Medication Sig Dispense Refill  . ALPRAZolam (XANAX) 0.5 MG tablet TAKE 1 TABLET (0.5 MG TOTAL) BY MOUTH 3 (THREE) TIMES DAILY AS NEEDED. 90 tablet 0  . atorvastatin (LIPITOR) 20 MG tablet TAKE 1 TABLET (20 MG TOTAL) BY MOUTH DAILY. 90 tablet 3  . Blood Glucose Monitoring Suppl (ONETOUCH VERIO FLEX SYSTEM) w/Device KIT USE DAILY AS DIRECTED 1 kit 0  . BYDUREON 2 MG PEN INJECT 2 MG AS DIRECTED ONCE A WEEK. 4 each 12  . cyclobenzaprine (FLEXERIL) 10 MG tablet TAKE 1 TABLET BY MOUTH 3 TIMES DAILY AS NEEDED FOR MUSCLE SPASMS WHEN NOT WORKING OR DRIVING 30 tablet 0  . dapagliflozin propanediol (FARXIGA) 5 MG TABS tablet Take 5 mg by mouth daily. 30 tablet 11  . esomeprazole (NEXIUM) 20 MG capsule Take 40 mg by mouth 2 (two) times daily before a meal.    . finasteride (PROSCAR) 5 MG tablet TAKE 1 TABLET (5 MG TOTAL) BY MOUTH DAILY. 90 tablet 3  . fluticasone furoate-vilanterol (BREO ELLIPTA) 100-25 MCG/INH AEPB Inhale into the lungs.    Marland Kitchen glipiZIDE (GLUCOTROL XL) 10 MG 24 hr tablet Take 1 tablet (10 mg total) by mouth daily with breakfast. 90 tablet 3   . hydrocortisone 2.5 % cream APPLY TOPICALLY 3 (THREE) TIMES DAILY AS NEEDED. 28.35 g 3  . hydrocortisone-pramoxine (ANALPRAM-HC) 2.5-1 % rectal cream Place 1 application rectally 3 (three) times daily as needed. 30 g 3  . ketorolac (ACULAR) 0.5 % ophthalmic solution USE AS DIRECTED 5 mL 1  . meloxicam (MOBIC) 15 MG tablet TAKE 1 TABLET (15 MG TOTAL) BY MOUTH DAILY. WITH A MEAL FOR NECK PAIN 30 tablet 5  . metFORMIN (GLUCOPHAGE-XR) 500 MG 24 hr tablet Take 3 tablets (1,500 mg total) by mouth daily with breakfast. 270 tablet 3  . Meth-Hyo-M Bl-Na Phos-Ph Sal (URO-MP) 118 MG CAPS Take 1 capsule by mouth every 6 (six) hours as needed.    Marland Kitchen olmesartan-hydrochlorothiazide (BENICAR HCT) 20-12.5 MG tablet Take 1 tablet by mouth daily. 30 tablet 11  . ONETOUCH DELICA LANCETS FINE MISC 1 Units by Does not apply route daily. 100 each 2  . ONETOUCH VERIO test strip USE 1 STRIP DAILY TO CHECK BLOOD SUGAR 50 strip 11  . tamsulosin (FLOMAX) 0.4 MG CAPS capsule TAKE 1 CAPSULE (0.4 MG TOTAL) BY MOUTH DAILY AFTER SUPPER. 90 capsule 3  . urea (CARMOL) 40 % CREA APPLY PEA SIZED AMOUNT TO HARD AREA BELOW PINKY TOE TWICE A DAY 28.35 g 1  . vitamin B-12 (CYANOCOBALAMIN) 1000 MCG tablet Take 1 tablet (1,000 mcg total) by mouth daily.    Marland Kitchen zolpidem (AMBIEN) 10 MG tablet  TAKE 1 TABLET BY MOUTH EVERY DAY AT BEDTIME AS NEEDED FOR SLEEP 30 tablet 0  . albuterol (PROVENTIL HFA;VENTOLIN HFA) 108 (90 Base) MCG/ACT inhaler Inhale into the lungs.    . nitroGLYCERIN (NITROSTAT) 0.4 MG SL tablet Place under the tongue.     No current facility-administered medications on file prior to visit.    Allergies  Allergen Reactions  . Iohexol      Code: HIVES, Desc: PT IS ALLERGIC TO IVP DYE AS NOTED IN PREVIOUS RECORDS IN PACS 02/2009/RM, Onset Date: [id number]   . Atorvastatin Other (See Comments)    Myalgias with this and rosuvastin  . Metformin And Related Diarrhea    Past Medical History:  Diagnosis Date  . Allergy   .  Anemia   . Blood transfusion without reported diagnosis    40 yrs ago   . BPH (benign prostatic hypertrophy)   . CHF (congestive heart failure) ([place])   . Depression   . Diabetes ([place])   . GERD (gastroesophageal reflux disease)   . Glaucoma   . Hyperlipidemia   . Hypertension   . Kidney stones   . Sleep apnea    no cpap     Past Surgical History:  Procedure Laterality Date  . CARDIAC CATHETERIZATION     [facility] - myocard perf and echo done as well - all normal- EF 48-55% 2019 April and june 2019  . COLONOSCOPY  2008  . FINGER SURGERY  2013   infection 2nd finger left hand  . POLYPECTOMY    . REPAIR FLEXOR TENDON HAND Right 08/2016    Family History  Problem Relation Age of Onset  . Alzheimer's disease Mother   . Heart disease Father   . COPD Father   . Colon cancer Father 54  . Prostate cancer Father   . Colon polyps Brother   . Leukemia Brother   . Leukemia Other   . Stomach cancer Neg Hx   . Esophageal cancer Neg Hx   . Rectal cancer Neg Hx     Social History   Socioeconomic History  . Marital status: Married    Spouse name: Not on file  . Number of children: 1  . Years of education: Not on file  . Highest education level: Not on file  Occupational History  . Occupation: Homeowners association management    Comment: retired  . Occupation: Adult nurse estate  Tobacco Use  . Smoking status: Former Smoker    Packs/day: 2.50    Years: 7.00    Pack years: 17.50    Types: Cigarettes    Quit date: 03/18/1983    Years since quitting: 36.1  . Smokeless tobacco: Never Used  Substance and Sexual Activity  . Alcohol use: No    Alcohol/week: 0.0 standard drinks  . Drug use: No  . Sexual activity: Yes  Other Topics Concern  . Not on file  Social History Narrative   No living will or health care POA thus far   Wife, then son, should make decisions. May make son the primary person   Would accept resuscitation attempts but wouldn't want to be on machines   No  tube feeds if cognitively unaware   Social Determinants of Health   Financial Resource Strain:   . Difficulty of Paying Living Expenses: Not on file  Food Insecurity:   . Worried About Charity fundraiser in the Last Year: Not on file  . Ran Out of Food in the Last Year:  Not on file  Transportation Needs:   . Lack of Transportation (Medical): Not on file  . Lack of Transportation (Non-Medical): Not on file  Physical Activity:   . Days of Exercise per Week: Not on file  . Minutes of Exercise per Session: Not on file  Stress:   . Feeling of Stress : Not on file  Social Connections:   . Frequency of Communication with Friends and Family: Not on file  . Frequency of Social Gatherings with Friends and Family: Not on file  . Attends Religious Services: Not on file  . Active Member of Clubs or Organizations: Not on file  . Attends Archivist Meetings: Not on file  . Marital Status: Not on file  Intimate Partner Violence:   . Fear of Current or Ex-Partner: Not on file  . Emotionally Abused: Not on file  . Physically Abused: Not on file  . Sexually Abused: Not on file   Review of Systems  Not really exercising---has trouble walking Trying to cheat less with sugars Eating some better Weight has drifted down    Objective:   Physical Exam  Constitutional: He appears well-developed. No distress.  Neck: No thyromegaly present.  Cardiovascular: Normal rate, regular rhythm, normal heart sounds and intact distal pulses. Exam reveals no gallop.  No murmur heard. Respiratory: Effort normal and breath sounds normal. No respiratory distress. He has no wheezes. He has no rales.  Musculoskeletal:        General: No edema.  Lymphadenopathy:    He has no cervical adenopathy.           Assessment & Plan:

## 2019-04-27 NOTE — Assessment & Plan Note (Addendum)
Hopefully some better but fasting sugars remain fairly high Only on the [place] for a month--but should help A1c some Still only lukewarm efforts at healthier lifestyle Does not want insulin or endocrinologist  Lab Results  Component Value Date   HGBA1C 8.2 (A) 04/27/2019   Much better Will stick with his meds

## 2019-06-07 ENCOUNTER — Other Ambulatory Visit: Payer: Self-pay | Admitting: Internal Medicine

## 2019-06-07 NOTE — Telephone Encounter (Signed)
Both last filled 04-25-19 Alprazolam #90 Zolpidem #30 Last OV 04-27-19 Next OV 07-20-19 [facility]

## 2019-06-09 ENCOUNTER — Other Ambulatory Visit: Payer: Self-pay | Admitting: Internal Medicine

## 2019-06-16 ENCOUNTER — Other Ambulatory Visit: Payer: Self-pay | Admitting: Internal Medicine

## 2019-06-16 NOTE — Telephone Encounter (Signed)
Received a eRx message from pharmacy:  Pharmacy comment: Product Backordered/Unavailable:NO [place]. CAN WE GET A CHANGE PLEASE.

## 2019-06-16 NOTE — Telephone Encounter (Signed)
They sent the rx that would replace it. It is called [employer]. They did not say how it works.

## 2019-06-16 NOTE — Telephone Encounter (Signed)
Spoke to pharmacy. [employer] is covered.

## 2019-06-16 NOTE — Telephone Encounter (Signed)
Find out what they will pay for I would probably recommend dulaglutide 1.5mg  weekly if they will pay for that

## 2019-06-17 ENCOUNTER — Encounter: Payer: Self-pay | Admitting: Emergency Medicine

## 2019-06-17 ENCOUNTER — Other Ambulatory Visit: Payer: Self-pay

## 2019-06-17 ENCOUNTER — Ambulatory Visit
Admission: EM | Admit: 2019-06-17 | Discharge: 2019-06-17 | Disposition: A | Discharge status: Home / Self Care | Payer: Managed Care, Other (non HMO) | Attending: Emergency Medicine | Admitting: Emergency Medicine

## 2019-06-17 DIAGNOSIS — R81 Glycosuria: Secondary | ICD-10-CM | POA: Diagnosis present

## 2019-06-17 DIAGNOSIS — N3090 Cystitis, unspecified without hematuria: Secondary | ICD-10-CM | POA: Diagnosis present

## 2019-06-17 DIAGNOSIS — I10 Essential (primary) hypertension: Secondary | ICD-10-CM

## 2019-06-17 LAB — POCT URINALYSIS DIP (MANUAL ENTRY)
Bilirubin, UA: NEGATIVE
Glucose, UA: 500 mg/dL — AB
Ketones, POC UA: NEGATIVE mg/dL
Leukocytes, UA: NEGATIVE
Nitrite, UA: POSITIVE — AB
Protein Ur, POC: NEGATIVE mg/dL
Spec Grav, UA: 1.02 (ref 1.010–1.025)
Urobilinogen, UA: 0.2 E.U./dL
pH, UA: 5 (ref 5.0–8.0)

## 2019-06-17 LAB — POCT FASTING CBG KUC MANUAL ENTRY: POCT Glucose (KUC): 247 mg/dL — AB (ref 70–99)

## 2019-06-17 MED ORDER — CEPHALEXIN 500 MG PO CAPS: 500.0000 mg | ORAL_CAPSULE | Freq: Two times a day (BID) | ORAL | 0 refills | Status: AC

## 2019-06-17 NOTE — Discharge Instructions (Signed)
Take the antibiotic as directed.    Schedule an appointment to see your primary care provider on Monday to discuss your elevated blood sugar and elevated blood pressure.    Your blood pressure is elevated today at 177/84.

## 2019-06-17 NOTE — ED Triage Notes (Signed)
Pt c/o dysuria. He states he has on going urinary problems but started getting worse about 4 days ago. He has been taking pyridium and drinking water but not getting better. He states he feels like his prostate may be enlarged.  Denies lower back pain or fevers.

## 2019-06-17 NOTE — ED Provider Notes (Signed)
Alejandro Koch    CSN: [id number] Arrival date & time: 06/17/19  1025      History   Chief Complaint Chief Complaint  Patient presents with  . Dysuria    HPI Alejandro Koch is a 69 y.o. male.   Patient presents with dysuria, frequency, hesitancy x4 days.  He describes this as "burning" with urination.  He has been taking Pyridium and has increased his water intake.  Denies fever, chills, malaise, abdominal pain, back pain, penile discharge, testicular pain, rash, lesions, or other symptoms.  Patient is diabetic; last A1c 9.1 on 01/18/2019.  The history is provided by the patient.    Past Medical History:  Diagnosis Date  . Allergy   . Anemia   . Blood transfusion without reported diagnosis    40 yrs ago   . BPH (benign prostatic hypertrophy)   . CHF (congestive heart failure) ([place])   . Depression   . Diabetes ([place])   . GERD (gastroesophageal reflux disease)   . Glaucoma   . Hyperlipidemia   . Hypertension   . Kidney stones   . Sleep apnea    no cpap     Patient Active Problem List   Diagnosis Date Noted  . Plantar wart of left foot 01/18/2019  . Chronic cough 07/16/2018  . Memory loss 03/30/2018  . Vitamin B12 deficiency 06/16/2017  . Fatigue 06/10/2017  . Acute prostatitis 06/10/2017  . Abdominal discomfort, epigastric 06/10/2017  . Laryngopharyngeal reflux (LPR) 06/03/2016  . Anemia 07/23/2015  . DOE (dyspnea on exertion) 05/21/2015  . Preventative health care 05/12/2014  . Actinic keratoses 05/12/2014  . BPH with obstruction/lower urinary tract symptoms   . Fatty liver 03/01/2010  . Hypogonadism in male 09/19/2009  . Type 2 diabetes mellitus with neurological manifestations, uncontrolled ([place]) 03/05/2009  . Hyperlipemia 09/30/2006  . Mood disorder ([place]) 09/30/2006  . Essential hypertension, benign 09/30/2006  . ALLERGIC RHINITIS 09/30/2006  . GERD 09/30/2006  . OSA (obstructive sleep apnea) 09/30/2006    Past Surgical History:  Procedure  Laterality Date  . CARDIAC CATHETERIZATION     [facility] - myocard perf and echo done as well - all normal- EF 48-55% 2019 April and june 2019  . COLONOSCOPY  2008  . FINGER SURGERY  2013   infection 2nd finger left hand  . POLYPECTOMY    . REPAIR FLEXOR TENDON HAND Right 08/2016       Home Medications    Prior to Admission medications   Medication Sig Start Date End Date Taking? Authorizing Provider  albuterol (PROVENTIL HFA;VENTOLIN HFA) 108 (90 Base) MCG/ACT inhaler Inhale into the lungs. 02/02/18 06/17/19 Yes [provider]  ALPRAZolam (XANAX) 0.5 MG tablet TAKE 1 TABLET (0.5 MG TOTAL) BY MOUTH 3 (THREE) TIMES DAILY AS NEEDED. 06/07/19  Yes Viviana Simpler I, MD  atorvastatin (LIPITOR) 20 MG tablet TAKE 1 TABLET (20 MG TOTAL) BY MOUTH DAILY. 07/23/17  Yes Venia Carbon, MD  Blood Glucose Monitoring Suppl (ONETOUCH VERIO FLEX SYSTEM) w/Device KIT USE DAILY AS DIRECTED 04/25/19  Yes Venia Carbon, MD  BYDUREON 2 MG PEN INJECT 2 MG AS DIRECTED ONCE A WEEK. 01/03/19  Yes Viviana Simpler I, MD  cyclobenzaprine (FLEXERIL) 10 MG tablet TAKE 1 TABLET BY MOUTH 3 TIMES DAILY AS NEEDED FOR MUSCLE SPASMS WHEN NOT WORKING OR DRIVING 9/89/21  Yes Venia Carbon, MD  dapagliflozin propanediol (FARXIGA) 5 MG TABS tablet Take 5 mg by mouth daily. 01/18/19  Yes Venia Carbon,  MD  esomeprazole (NEXIUM) 20 MG capsule Take 40 mg by mouth 2 (two) times daily before a meal.   Yes [provider]  finasteride (PROSCAR) 5 MG tablet TAKE 1 TABLET (5 MG TOTAL) BY MOUTH DAILY. 08/24/18  Yes Venia Carbon, MD  fluticasone furoate-vilanterol (BREO ELLIPTA) 100-25 MCG/INH AEPB Inhale into the lungs. 02/02/18  Yes [provider]  glipiZIDE (GLUCOTROL XL) 10 MG 24 hr tablet Take 1 tablet (10 mg total) by mouth daily with breakfast. 07/19/18  Yes Viviana Simpler I, MD  hydrocortisone 2.5 % cream APPLY TOPICALLY 3 (THREE) TIMES DAILY AS NEEDED. 10/27/18  Yes Venia Carbon, MD    hydrocortisone-pramoxine [facility] 2.5-1 % rectal cream Place 1 application rectally 3 (three) times daily as needed. 07/30/12  Yes Ricard Dillon, MD  ketorolac Nancie Neas) 0.5 % ophthalmic solution USE AS DIRECTED 06/27/13  Yes Ricard Dillon, MD  meloxicam (MOBIC) 15 MG tablet TAKE 1 TABLET (15 MG TOTAL) BY MOUTH DAILY. WITH A MEAL FOR NECK PAIN 06/09/19  Yes Venia Carbon, MD  Meth-Hyo-M Bl-Na Phos-Ph Sal (URO-MP) 118 MG CAPS Take 1 capsule by mouth every 6 (six) hours as needed. 05/09/16  Yes [provider]  olmesartan-hydrochlorothiazide (BENICAR HCT) 20-12.5 MG tablet Take 1 tablet by mouth daily. 05/10/18  Yes Venia Carbon, MD  [facility] DELICA LANCETS FINE MISC 1 Units by Does not apply route daily. 07/26/15  Yes Venia Carbon, MD  ONETOUCH VERIO test strip USE 1 STRIP DAILY TO CHECK BLOOD SUGAR 09/23/18  Yes Venia Carbon, MD  tamsulosin (FLOMAX) 0.4 MG CAPS capsule TAKE 1 CAPSULE (0.4 MG TOTAL) BY MOUTH DAILY AFTER SUPPER. 07/19/18  Yes Venia Carbon, MD  zolpidem (AMBIEN) 10 MG tablet TAKE 1 TABLET BY MOUTH AT BEDTIME AS NEEDED FOR SLEEP 06/07/19  Yes Venia Carbon, MD  cephALEXin (KEFLEX) 500 MG capsule Take 1 capsule (500 mg total) by mouth 2 (two) times daily for 5 days. 06/17/19 06/22/19  Sharion Balloon, NP  metFORMIN (GLUCOPHAGE-XR) 500 MG 24 hr tablet Take 3 tablets (1,500 mg total) by mouth daily with breakfast. 07/19/18   Venia Carbon, MD  nitroGLYCERIN (NITROSTAT) 0.4 MG SL tablet Place under the tongue. 06/25/17 06/25/18  [provider]  urea (CARMOL) 40 % CREA APPLY PEA SIZED AMOUNT TO HARD AREA BELOW PINKY TOE TWICE A DAY 06/07/19   Venia Carbon, MD  vitamin B-12 (CYANOCOBALAMIN) 1000 MCG tablet Take 1 tablet (1,000 mcg total) by mouth daily. 06/10/17   Ria Bush, MD    Family History Family History  Problem Relation Age of Onset  . Alzheimer's disease Mother   . Heart disease Father   . COPD Father   . Colon cancer Father 41   . Prostate cancer Father   . Colon polyps Brother   . Leukemia Brother   . Leukemia Other   . Stomach cancer Neg Hx   . Esophageal cancer Neg Hx   . Rectal cancer Neg Hx     Social History Social History   Tobacco Use  . Smoking status: Former Smoker    Packs/day: 2.50    Years: 7.00    Pack years: 17.50    Types: Cigarettes    Quit date: 03/18/1983    Years since quitting: 36.2  . Smokeless tobacco: Never Used  Substance Use Topics  . Alcohol use: No    Alcohol/week: 0.0 standard drinks  . Drug use: No  Allergies   Iohexol, Atorvastatin, and Metformin and related   Review of Systems Review of Systems  Constitutional: Negative for chills and fever.  HENT: Negative for ear pain and sore throat.   Eyes: Negative for pain and visual disturbance.  Respiratory: Negative for cough and shortness of breath.   Cardiovascular: Negative for chest pain and palpitations.  Gastrointestinal: Negative for abdominal pain and vomiting.  Genitourinary: Positive for dysuria and frequency. Negative for discharge, flank pain, hematuria and testicular pain.  Musculoskeletal: Negative for arthralgias and back pain.  Skin: Negative for color change and rash.  Neurological: Negative for seizures and syncope.  All other systems reviewed and are negative.    Physical Exam Triage Vital Signs ED Triage Vitals [06/17/19 1032]  Enc Vitals Group     BP (!) 177/84     Pulse Rate 83     Resp 18     Temp 98.1 F (36.7 C)     Temp Source Oral     SpO2 96 %     Weight 265 lb (120.2 kg)     Height 6' 5" (1.956 m)     Head Circumference      Peak Flow      Pain Score 0     Pain Loc      Pain Edu?      Excl. in [place]?    No data found.  Updated Vital Signs BP (!) 177/84 (BP Location: Left Arm)   Pulse 83   Temp 98.1 F (36.7 C) (Oral)   Resp 18   Ht 6' 5" (1.956 m)   Wt 265 lb (120.2 kg)   SpO2 96%   BMI 31.42 kg/m   Visual Acuity Right Eye Distance:   Left Eye Distance:    Bilateral Distance:    Right Eye Near:   Left Eye Near:    Bilateral Near:     Physical Exam Vitals and nursing note reviewed.  Constitutional:      General: He is not in acute distress.    Appearance: He is well-developed. He is not ill-appearing.  HENT:     Head: Normocephalic and atraumatic.     Mouth/Throat:     Mouth: Mucous membranes are moist.     Pharynx: Oropharynx is clear.  Eyes:     Conjunctiva/sclera: Conjunctivae normal.  Cardiovascular:     Rate and Rhythm: Normal rate and regular rhythm.     Heart sounds: No murmur.  Pulmonary:     Effort: Pulmonary effort is normal. No respiratory distress.     Breath sounds: Normal breath sounds. No wheezing or rhonchi.  Abdominal:     General: Bowel sounds are normal.     Palpations: Abdomen is soft.     Tenderness: There is no abdominal tenderness. There is no right CVA tenderness, left CVA tenderness, guarding or rebound.  Musculoskeletal:     Cervical back: Neck supple.     Right lower leg: No edema.     Left lower leg: No edema.  Skin:    General: Skin is warm and dry.     Findings: No rash.  Neurological:     General: No focal deficit present.     Mental Status: He is alert and oriented to person, place, and time.  Psychiatric:        Mood and Affect: Mood normal.        Behavior: Behavior normal.      UC Treatments / Results  Labs (all  labs ordered are listed, but only abnormal results are displayed) Labs Reviewed  POCT URINALYSIS DIP (MANUAL ENTRY) - Abnormal; Notable for the following components:      Result Value   Color, UA orange (*)    Glucose, UA =500 (*)    Blood, UA trace-lysed (*)    Nitrite, UA Positive (*)    All other components within normal limits  POCT FASTING CBG [facility] MANUAL ENTRY - Abnormal; Notable for the following components:   POCT Glucose ([facility]) 247 (*)    All other components within normal limits  URINE CULTURE    EKG   Radiology No results  found.  Procedures Procedures (including critical care time)  Medications Ordered in UC Medications - No data to display  Initial Impression / Assessment and Plan / UC Course  I have reviewed the triage vital signs and the nursing notes.  Pertinent labs & imaging results that were available during my care of the patient were reviewed by me and considered in my medical decision making (see chart for details).   Cystitis, glucosuria, elevated blood pressure with known hypertension.  Treating with Keflex.  Urine culture pending.  Discussed with patient that his blood sugar and blood pressure are elevated today.  Instructed patient to follow-up with his PCP on Monday to discuss his elevated blood sugar, glucosuria, elevated blood pressure.  Patient agrees to plan of care.   Final Clinical Impressions(s) / UC Diagnoses   Final diagnoses:  Cystitis  Glucosuria  Elevated blood pressure reading in office with diagnosis of hypertension     Discharge Instructions     Take the antibiotic as directed.    Schedule an appointment to see your primary care provider on Monday to discuss your elevated blood sugar and elevated blood pressure.    Your blood pressure is elevated today at 177/84.           ED Prescriptions    Medication Sig Dispense Auth. Provider   cephALEXin (KEFLEX) 500 MG capsule Take 1 capsule (500 mg total) by mouth 2 (two) times daily for 5 days. 10 capsule Sharion Balloon, NP     PDMP not reviewed this encounter.   Sharion Balloon, NP 06/17/19 [phone]

## 2019-06-18 LAB — URINE CULTURE: Culture: NO GROWTH

## 2019-06-19 NOTE — Telephone Encounter (Signed)
It is the same drug---I an not sure what the difference is. They should fill that at the same dose---unless they recommend a change Why didn't they say this to begin with!!!!

## 2019-06-20 NOTE — Telephone Encounter (Signed)
I agree!! I sent the rx as listed in the request.

## 2019-07-05 ENCOUNTER — Other Ambulatory Visit: Payer: Self-pay | Admitting: Internal Medicine

## 2019-07-12 ENCOUNTER — Other Ambulatory Visit: Payer: Self-pay | Admitting: Internal Medicine

## 2019-07-20 ENCOUNTER — Encounter: Payer: Self-pay | Admitting: Internal Medicine

## 2019-07-20 ENCOUNTER — Ambulatory Visit (INDEPENDENT_AMBULATORY_CARE_PROVIDER_SITE_OTHER): Payer: Managed Care, Other (non HMO) | Admitting: Internal Medicine

## 2019-07-20 ENCOUNTER — Other Ambulatory Visit: Payer: Self-pay

## 2019-07-20 VITALS — BP 124/68 | HR 82 | Temp 96.8°F | Ht 75.5 in | Wt 266.0 lb

## 2019-07-20 DIAGNOSIS — I10 Essential (primary) hypertension: Secondary | ICD-10-CM | POA: Diagnosis not present

## 2019-07-20 DIAGNOSIS — E1165 Type 2 diabetes mellitus with hyperglycemia: Secondary | ICD-10-CM

## 2019-07-20 DIAGNOSIS — Z23 Encounter for immunization: Secondary | ICD-10-CM

## 2019-07-20 DIAGNOSIS — R413 Other amnesia: Secondary | ICD-10-CM | POA: Diagnosis not present

## 2019-07-20 DIAGNOSIS — E1149 Type 2 diabetes mellitus with other diabetic neurological complication: Secondary | ICD-10-CM | POA: Diagnosis not present

## 2019-07-20 DIAGNOSIS — N401 Enlarged prostate with lower urinary tract symptoms: Secondary | ICD-10-CM

## 2019-07-20 DIAGNOSIS — E538 Deficiency of other specified B group vitamins: Secondary | ICD-10-CM

## 2019-07-20 DIAGNOSIS — Z0000 Encounter for general adult medical examination without abnormal findings: Secondary | ICD-10-CM | POA: Diagnosis not present

## 2019-07-20 DIAGNOSIS — N138 Other obstructive and reflux uropathy: Secondary | ICD-10-CM

## 2019-07-20 DIAGNOSIS — IMO0002 Reserved for concepts with insufficient information to code with codable children: Secondary | ICD-10-CM

## 2019-07-20 DIAGNOSIS — G4733 Obstructive sleep apnea (adult) (pediatric): Secondary | ICD-10-CM

## 2019-07-20 LAB — LIPID PANEL
Cholesterol: 192 mg/dL (ref 0–200)
HDL: 40.7 mg/dL (ref >39.00)
LDL Cholesterol: 114 mg/dL — ABNORMAL HIGH (ref 0–99)
NonHDL: 150.97
Total CHOL/HDL Ratio: 5
Triglycerides: 186 mg/dL — ABNORMAL HIGH (ref 0.0–149.0)
VLDL: 37.2 mg/dL (ref 0.0–40.0)

## 2019-07-20 LAB — VITAMIN B12: Vitamin B-12: 766 pg/mL (ref 211–911)

## 2019-07-20 LAB — CBC
HCT: 35.6 % — ABNORMAL LOW (ref 39.0–52.0)
Hemoglobin: 12.4 g/dL — ABNORMAL LOW (ref 13.0–17.0)
MCHC: 35 g/dL (ref 30.0–36.0)
MCV: 86.8 fl (ref 78.0–100.0)
Platelets: 206 10*3/uL (ref 150.0–400.0)
RBC: 4.1 Mil/uL — ABNORMAL LOW (ref 4.22–5.81)
RDW: 13.4 % (ref 11.5–15.5)
WBC: 5.7 10*3/uL (ref 4.0–10.5)

## 2019-07-20 LAB — COMPREHENSIVE METABOLIC PANEL
ALT: 30 U/L (ref 0–53)
AST: 22 U/L (ref 0–37)
Albumin: 4 g/dL (ref 3.5–5.2)
Alkaline Phosphatase: 60 U/L (ref 39–117)
BUN: 15 mg/dL (ref 6–23)
CO2: 29 mEq/L (ref 19–32)
Calcium: 8.7 mg/dL (ref 8.4–10.5)
Chloride: 103 mEq/L (ref 96–112)
Creatinine, Ser: 0.91 mg/dL (ref 0.40–1.50)
GFR: 82.56 mL/min (ref >60.00)
Glucose, Bld: 211 mg/dL — ABNORMAL HIGH (ref 70–99)
Potassium: 4 mEq/L (ref 3.5–5.1)
Sodium: 139 mEq/L (ref 135–145)
Total Bilirubin: 0.5 mg/dL (ref 0.2–1.2)
Total Protein: 6.3 g/dL (ref 6.0–8.3)

## 2019-07-20 LAB — T4, FREE: Free T4: 0.78 ng/dL (ref 0.60–1.60)

## 2019-07-20 LAB — HEMOGLOBIN A1C: Hgb A1c MFr Bld: 8.9 % — ABNORMAL HIGH (ref 4.6–6.5)

## 2019-07-20 LAB — PSA: PSA: 0.2 ng/mL (ref 0.10–4.00)

## 2019-07-20 LAB — HM DIABETES FOOT EXAM

## 2019-07-20 NOTE — Assessment & Plan Note (Signed)
Worsening now Some gait issues---will check brain MRI Blood work Recheck 1-2 months with wife Consider trial off statin --just in case

## 2019-07-20 NOTE — Assessment & Plan Note (Signed)
Still with high fasting sugars Will check A1c--doesn't want insulin or endocrine (but may need to consider)

## 2019-07-20 NOTE — Assessment & Plan Note (Signed)
Will be due for colon in 2024 Will check PSA Had COVID vaccine shingrix #2 today Discussed fitness

## 2019-07-20 NOTE — Addendum Note (Signed)
Addended by: Pilar Grammes on: 07/20/2019 10:12 AM   Modules accepted: Orders

## 2019-07-20 NOTE — Patient Instructions (Signed)
Please bring your wife in at the visit in 2 months or so

## 2019-07-20 NOTE — Assessment & Plan Note (Signed)
Did not tolerate CPAP so not using Discussed considering trial with it again

## 2019-07-20 NOTE — Assessment & Plan Note (Signed)
BP Readings from Last 3 Encounters:  07/20/19 124/68  06/17/19 (!) 177/84  04/27/19 118/66   Good control

## 2019-07-20 NOTE — Assessment & Plan Note (Signed)
Ongoing symptoms despite dual therapy Not ready for procedure

## 2019-07-20 NOTE — Progress Notes (Signed)
Subjective:    Patient ID: Alejandro Koch, male    DOB: 07-02-1950, 69 y.o.   MRN: [id number]  HPI Here for physical Goes on Medicare soon This visit occurred during the SARS-CoV-2 public health emergency.  Safety protocols were in place, including screening questions prior to the visit, additional usage of staff PPE, and extensive cleaning of exam room while observing appropriate contact time as indicated for disinfecting solutions.   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 ALPRAZolam (XANAX) 0.5 MG tablet TAKE 1 TABLET (0.5 MG TOTAL) BY MOUTH 3 (THREE) TIMES DAILY AS NEEDED. 90 tablet 0  . atorvastatin (LIPITOR) 20 MG tablet TAKE 1 TABLET (20 MG TOTAL) BY MOUTH DAILY. 90 tablet 3  . Blood Glucose Monitoring Suppl (ONETOUCH VERIO FLEX SYSTEM) w/Device KIT USE DAILY AS DIRECTED 1 kit 0  . cyclobenzaprine (FLEXERIL) 10 MG tablet TAKE 1 TABLET BY MOUTH 3 TIMES DAILY AS NEEDED FOR MUSCLE SPASMS WHEN NOT WORKING OR DRIVING 30 tablet 0  . dapagliflozin propanediol (FARXIGA) 5 MG TABS tablet Take 5 mg by mouth daily. 30 tablet 11  . esomeprazole (NEXIUM) 20 MG capsule Take 40 mg by mouth 2 (two) times daily before a meal.    . Exenatide ER (BYDUREON BCISE) 2 MG/0.85ML  AUIJ Inject 2 mg into the skin once a week. 4 pen 5  . finasteride (PROSCAR) 5 MG tablet TAKE 1 TABLET (5 MG TOTAL) BY MOUTH DAILY. 90 tablet 3  . glipiZIDE (GLUCOTROL XL) 10 MG 24 hr tablet TAKE 1 TABLET (10 MG TOTAL) BY MOUTH DAILY WITH BREAKFAST. 90 tablet 3  . hydrocortisone 2.5 % cream APPLY TOPICALLY 3 (THREE) TIMES DAILY AS NEEDED. 28.35 g 3  . hydrocortisone-pramoxine (ANALPRAM-HC) 2.5-1 % rectal cream Place 1 application rectally 3 (three) times daily as needed. 30 g 3  . ketorolac (ACULAR) 0.5 % ophthalmic solution USE AS DIRECTED 5 mL 1  . meloxicam (MOBIC) 15 MG tablet TAKE 1 TABLET (15 MG TOTAL) BY MOUTH DAILY. WITH A MEAL FOR NECK PAIN 30 tablet 5  . metFORMIN (GLUCOPHAGE-XR) 500 MG 24 hr tablet TAKE 3 TABLETS BY MOUTH DAILY WITH BREAKFAST. 270 tablet 3  . Meth-Hyo-M Bl-Na Phos-Ph Sal (URO-MP) 118 MG CAPS Take 1 capsule by mouth every 6 (six) hours as needed.    Marland Kitchen olmesartan-hydrochlorothiazide (BENICAR HCT) 20-12.5 MG tablet Take 1 tablet by mouth daily. 30 tablet 11  . ONETOUCH DELICA LANCETS FINE MISC 1 Units by Does not apply route daily. 100 each 2  . ONETOUCH VERIO test strip USE 1 STRIP DAILY TO CHECK BLOOD SUGAR 50 strip 11  . tamsulosin (FLOMAX) 0.4 MG CAPS capsule TAKE 1 CAPSULE (0.4 MG TOTAL) BY MOUTH DAILY AFTER SUPPER. 90 capsule 3  . urea (CARMOL) 40 % CREA  APPLY PEA SIZED AMOUNT TO HARD AREA BELOW PINKY TOE TWICE A DAY 28.35 g 1  . vitamin B-12 (CYANOCOBALAMIN) 1000 MCG tablet Take 1 tablet (1,000 mcg total) by mouth daily.    Marland Kitchen zolpidem (AMBIEN) 10 MG tablet TAKE 1 TABLET BY MOUTH AT BEDTIME AS NEEDED FOR SLEEP 30 tablet 0  . albuterol (PROVENTIL HFA;VENTOLIN HFA) 108 (90 Base) MCG/ACT inhaler Inhale into the lungs.    . fluticasone furoate-vilanterol (BREO ELLIPTA) 100-25 MCG/INH AEPB Inhale into the lungs.    . nitroGLYCERIN (NITROSTAT) 0.4 MG SL tablet Place under the tongue.     No current facility-administered medications on file prior to visit.    Allergies   Allergen Reactions  . Iohexol      Code: HIVES, Desc: PT IS ALLERGIC TO IVP DYE AS NOTED IN PREVIOUS RECORDS IN PACS 02/2009/RM, Onset Date: [id number]   . Atorvastatin Other (See Comments)    Myalgias with this and rosuvastin  . Metformin And Related Diarrhea    Past Medical History:  Diagnosis Date  . Allergy   . Anemia   . Blood transfusion without reported diagnosis    40 yrs ago   . BPH (benign prostatic hypertrophy)   . CHF (congestive heart failure) ([place])   . Depression   . Diabetes ([place])   . GERD (gastroesophageal reflux disease)   . Glaucoma   . Hyperlipidemia   . Hypertension   . Kidney stones   . Sleep apnea    no cpap     Past Surgical History:  Procedure Laterality Date  . CARDIAC CATHETERIZATION     [facility] - myocard perf and echo done as well - all normal- EF 48-55% 2019 April and june 2019  . COLONOSCOPY  2008  . FINGER SURGERY  2013   infection 2nd finger left hand  . POLYPECTOMY    . REPAIR FLEXOR TENDON HAND Right 08/2016    Family History  Problem Relation Age of Onset  . Alzheimer's disease Mother   . Heart disease Father   . COPD Father   . Colon cancer Father 99  . Prostate cancer Father   . Colon polyps Brother   . Leukemia Brother   . Leukemia Other   . Stomach cancer Neg Hx   . Esophageal cancer Neg Hx   . Rectal cancer Neg Hx     Social History   Socioeconomic History  . Marital status: Married    Spouse name: Not on file  . Number of children: 1  . Years of education: Not on file  . Highest education level: Not on file  Occupational History  . Occupation: Homeowners association management    Comment: retired  . Occupation: Adult nurse estate  Tobacco Use  . Smoking status: Former Smoker    Packs/day: 2.50    Years: 7.00    Pack years: 17.50    Types: Cigarettes    Quit date: 03/18/1983    Years since quitting: 36.3  . Smokeless tobacco: Never Used  Substance and Sexual Activity  . Alcohol use: No    Alcohol/week:  0.0 standard drinks  . Drug use: No  . Sexual activity: Yes  Other Topics Concern  . Not on file  Social History Narrative   No living will or health care POA thus far   Wife, then son, should make decisions. May make son the primary person   Would accept resuscitation attempts but wouldn't want to be on machines   No tube  feeds if cognitively unaware   Social Determinants of Health   Financial Resource Strain:   . Difficulty of Paying Living Expenses:   Food Insecurity:   . Worried About Charity fundraiser in the Last Year:   . Arboriculturist in the Last Year:   Transportation Needs:   . Film/video editor (Medical):   Marland Kitchen Lack of Transportation (Non-Medical):   Physical Activity:   . Days of Exercise per Week:   . Minutes of Exercise per Session:   Stress:   . Feeling of Stress :   Social Connections:   . Frequency of Communication with Friends and Family:   . Frequency of Social Gatherings with Friends and Family:   . Attends Religious Services:   . Active Member of Clubs or Organizations:   . Attends Archivist Meetings:   Marland Kitchen Marital Status:   Intimate Partner Violence:   . Fear of Current or Ex-Partner:   . Emotionally Abused:   Marland Kitchen Physically Abused:   . Sexually Abused:    Review of Systems  Constitutional:       Weight is down 10# over a year Wears seat belt  HENT: Positive for tinnitus. Negative for trouble swallowing.        Mild hearing loss Keeps up with dentist---needs cap  Eyes: Negative for visual disturbance.       Has surgery on lens on left Keeps up with them  Respiratory: Negative for cough, chest tightness and shortness of breath.        Hasn't been using the breo  Cardiovascular: Negative for chest pain.       Occ feels skip  Gastrointestinal: Negative for blood in stool and constipation.       Still has RUQ pain--fatty liver (improved with weight loss) No heartburn on PPI  Endocrine: Negative for polydipsia and polyuria.   Genitourinary: Positive for difficulty urinating and frequency. Negative for dysuria.  Musculoskeletal: Negative for arthralgias, back pain and joint swelling.  Skin: Negative for rash.       Using cream on face---it does keep if from being inflamed  Allergic/Immunologic: Negative for environmental allergies and immunocompromised state.  Neurological: Negative for dizziness, syncope, light-headedness and headaches.       Misjudges corners, etc while walking  Hematological: Negative for adenopathy. Does not bruise/bleed easily.  Psychiatric/Behavioral: Positive for dysphoric mood and sleep disturbance. The patient is nervous/anxious.        Objective:   Physical Exam  Constitutional: He is oriented to person, place, and time. He appears well-developed. No distress.  HENT:  Head: Normocephalic and atraumatic.  Right Ear: External ear normal.  Left Ear: External ear normal.  Mouth/Throat: Oropharynx is clear and moist. No oropharyngeal exudate.  Eyes: Pupils are equal, round, and reactive to light. Conjunctivae are normal.  Neck: No thyromegaly present.  Cardiovascular: Normal rate, regular rhythm, normal heart sounds and intact distal pulses. Exam reveals no gallop.  No murmur heard. Respiratory: Effort normal. No respiratory distress. He has no wheezes. He has no rales.  Decreased breath sounds but clear  GI: Soft. He exhibits no distension. There is no rebound and no guarding.  Very slight RUQ tenderness  Musculoskeletal:        General: No tenderness or edema.  Lymphadenopathy:    He has no cervical adenopathy.  Neurological: He is alert and oriented to person, place, and time.  President--"Biden, Bush---Trump, Bush---Obama" [id number] Recall 0/3  Fairly normal sensation in  feet  Skin: No rash noted. No erythema.  No foot lesions  Psychiatric: He has a normal mood and affect. His behavior is normal.           Assessment & Plan:

## 2019-07-20 NOTE — Assessment & Plan Note (Signed)
If still low--- will start shots

## 2019-07-25 ENCOUNTER — Other Ambulatory Visit: Payer: Self-pay | Admitting: Internal Medicine

## 2019-07-26 NOTE — Telephone Encounter (Signed)
Both last filled 06-07-19 #30  Last OV 07-20-19  Next OV 09-19-19 [facility]

## 2019-07-27 ENCOUNTER — Other Ambulatory Visit: Payer: Self-pay | Admitting: Internal Medicine

## 2019-07-27 DIAGNOSIS — R413 Other amnesia: Secondary | ICD-10-CM

## 2019-08-04 ENCOUNTER — Ambulatory Visit (INDEPENDENT_AMBULATORY_CARE_PROVIDER_SITE_OTHER)
Admission: RE | Admit: 2019-08-04 | Discharge: 2019-08-04 | Disposition: A | Discharge status: Home / Self Care | Payer: Managed Care, Other (non HMO) | Source: Ambulatory Visit | Attending: Internal Medicine | Admitting: Internal Medicine

## 2019-08-04 ENCOUNTER — Other Ambulatory Visit: Payer: Self-pay

## 2019-08-04 DIAGNOSIS — R413 Other amnesia: Secondary | ICD-10-CM

## 2019-08-05 ENCOUNTER — Other Ambulatory Visit: Payer: Self-pay | Admitting: Internal Medicine

## 2019-08-05 NOTE — Telephone Encounter (Signed)
Patient last seen for health maintenance issues on 07/20/19 for routine health maintenance.  He was to continue the proscar so will refill X 24mths.

## 2019-08-13 ENCOUNTER — Other Ambulatory Visit: Payer: Self-pay | Admitting: Internal Medicine

## 2019-08-15 ENCOUNTER — Other Ambulatory Visit: Payer: Self-pay | Admitting: Internal Medicine

## 2019-08-23 DIAGNOSIS — R69 Illness, unspecified: Secondary | ICD-10-CM | POA: Diagnosis not present

## 2019-09-17 ENCOUNTER — Other Ambulatory Visit: Payer: Self-pay

## 2019-09-17 ENCOUNTER — Ambulatory Visit (HOSPITAL_COMMUNITY)
Admission: EM | Admit: 2019-09-17 | Discharge: 2019-09-17 | Disposition: A | Discharge status: Home / Self Care | Payer: Medicare HMO | Attending: Family Medicine | Admitting: Family Medicine

## 2019-09-17 ENCOUNTER — Encounter (HOSPITAL_COMMUNITY): Payer: Self-pay | Admitting: *Deleted

## 2019-09-17 DIAGNOSIS — R110 Nausea: Secondary | ICD-10-CM

## 2019-09-17 DIAGNOSIS — R319 Hematuria, unspecified: Secondary | ICD-10-CM | POA: Diagnosis not present

## 2019-09-17 DIAGNOSIS — E0865 Diabetes mellitus due to underlying condition with hyperglycemia: Secondary | ICD-10-CM | POA: Diagnosis not present

## 2019-09-17 DIAGNOSIS — R197 Diarrhea, unspecified: Secondary | ICD-10-CM | POA: Diagnosis not present

## 2019-09-17 DIAGNOSIS — K529 Noninfective gastroenteritis and colitis, unspecified: Secondary | ICD-10-CM | POA: Insufficient documentation

## 2019-09-17 DIAGNOSIS — R300 Dysuria: Secondary | ICD-10-CM | POA: Insufficient documentation

## 2019-09-17 LAB — POCT URINALYSIS DIP (DEVICE)
Glucose, UA: 500 mg/dL — AB
Ketones, ur: 15 mg/dL — AB
Leukocytes,Ua: NEGATIVE
Nitrite: NEGATIVE
Protein, ur: NEGATIVE mg/dL
Specific Gravity, Urine: >=1.03 (ref 1.005–1.030)
Urobilinogen, UA: 0.2 mg/dL (ref 0.0–1.0)
pH: 5.5 (ref 5.0–8.0)

## 2019-09-17 LAB — CBG MONITORING, ED: Glucose-Capillary: 229 mg/dL — ABNORMAL HIGH (ref 70–99)

## 2019-09-17 MED ORDER — SULFAMETHOXAZOLE-TRIMETHOPRIM 800-160 MG PO TABS: 1.0000 | ORAL_TABLET | Freq: Two times a day (BID) | ORAL | 0 refills | Status: AC

## 2019-09-17 MED ORDER — ONDANSETRON 4 MG PO TBDP: 4.0000 mg | ORAL_TABLET | Freq: Three times a day (TID) | ORAL | 0 refills | Status: DC | PRN

## 2019-09-17 NOTE — ED Triage Notes (Signed)
C/O dysuria x 2 wks.  Started with diarrhea and multiple episodes vomiting since last night.  Has been having pelvic pain. Has been taking AZO.  Unsure if fevers.

## 2019-09-18 LAB — URINE CULTURE: Culture: NO GROWTH

## 2019-09-19 ENCOUNTER — Telehealth (HOSPITAL_COMMUNITY): Payer: Self-pay | Admitting: *Deleted

## 2019-09-19 NOTE — ED Provider Notes (Signed)
Alejandro Koch    ASSESSMENT & PLAN:  1. Gastroenteritis   2. Dysuria   3. Hematuria, unspecified type   4. Diabetes mellitus due to underlying condition with hyperglycemia, without long-term current use of insulin ([place])   5. Nausea     Will tx for possibility of prostatitis. GI symptoms could be an early viral illness. Discussed.  Meds ordered this encounter  Medications  . sulfamethoxazole-trimethoprim (BACTRIM DS) 800-160 MG tablet    Sig: Take 1 tablet by mouth 2 (two) times daily for 14 days.    Dispense:  28 tablet    Refill:  0  . ondansetron (ZOFRAN-ODT) 4 MG disintegrating tablet    Sig: Take 1 tablet (4 mg total) by mouth every 8 (eight) hours as needed for nausea or vomiting.    Dispense:  15 tablet    Refill:  0    Urine culture sent. Will follow up with his PCP to discuss if he needs longer treatment.  Outlined signs and symptoms indicating need for more acute intervention. Patient verbalized understanding. After Visit Summary given.  SUBJECTIVE:  Alejandro Koch is a 69 y.o. male who complains of dysuria for the past 1-2 weeks; now worse. Also notes frequent diarrhea and emesis last evening; somewhat better this morning. Afebrile. H/O prostate problems reported. Does seem to dribble urine lately. No abdominal pain. Blood sugars usu run around 200.  OBJECTIVE:  Vitals:   09/17/19 1401  BP: (!) 154/80  Pulse: 80  Resp: 14  Temp: 97.9 F (36.6 C)  TempSrc: Oral  SpO2: 98%   General appearance: alert; no distress HENT: oropharynx: moist Lungs: unlabored respirations Abdomen: soft, non-tender; no guarding or rebound tenderness Back: no CVA tenderness Extremities: no edema; symmetrical with no gross deformities Skin: warm and dry Neurologic: normal gait Psychological: alert and cooperative; normal mood and affect  Labs Reviewed  CBG MONITORING, ED - Abnormal; Notable for the following components:      Result Value   Glucose-Capillary  229 (*)    All other components within normal limits  POCT URINALYSIS DIP (DEVICE) - Abnormal; Notable for the following components:   Glucose, UA 500 (*)    Bilirubin Urine SMALL (*)    Ketones, ur 15 (*)    Hgb urine dipstick MODERATE (*)    All other components within normal limits  URINE CULTURE    Allergies  Allergen Reactions  . Iohexol      Code: HIVES, Desc: PT IS ALLERGIC TO IVP DYE AS NOTED IN PREVIOUS RECORDS IN PACS 02/2009/RM, Onset Date: [id number]   . Atorvastatin Other (See Comments)    Myalgias with this and rosuvastin  . Metformin And Related Diarrhea    Past Medical History:  Diagnosis Date  . Allergy   . Anemia   . Blood transfusion without reported diagnosis    40 yrs ago   . BPH (benign prostatic hypertrophy)   . CHF (congestive heart failure) ([place])   . Depression   . Diabetes ([place])   . GERD (gastroesophageal reflux disease)   . Glaucoma   . Hyperlipidemia   . Hypertension   . Kidney stones   . Sleep apnea    no cpap    Social History   Socioeconomic History  . Marital status: Married    Spouse name: Not on file  . Number of children: 1  . Years of education: Not on file  . Highest education level: Not on file  Occupational History  .  Occupation: Homeowners association management    Comment: retired  . Occupation: Adult nurse estate  Tobacco Use  . Smoking status: Former Smoker    Packs/day: 2.50    Years: 7.00    Pack years: 17.50    Types: Cigarettes    Quit date: 03/18/1983    Years since quitting: 36.5  . Smokeless tobacco: Never Used  Vaping Use  . Vaping Use: Never used  Substance and Sexual Activity  . Alcohol use: Yes    Comment: rare  . Drug use: No  . Sexual activity: Not on file  Other Topics Concern  . Not on file  Social History Narrative   No living will or health care POA thus far   Wife, then son, should make decisions. May make son the primary person   Would accept resuscitation attempts but wouldn't want  to be on machines   No tube feeds if cognitively unaware   Social Determinants of Health   Financial Resource Strain:   . Difficulty of Paying Living Expenses:   Food Insecurity:   . Worried About Charity fundraiser in the Last Year:   . Arboriculturist in the Last Year:   Transportation Needs:   . Film/video editor (Medical):   Marland Kitchen Lack of Transportation (Non-Medical):   Physical Activity:   . Days of Exercise per Week:   . Minutes of Exercise per Session:   Stress:   . Feeling of Stress :   Social Connections:   . Frequency of Communication with Friends and Family:   . Frequency of Social Gatherings with Friends and Family:   . Attends Religious Services:   . Active Member of Clubs or Organizations:   . Attends Archivist Meetings:   Marland Kitchen Marital Status:   Intimate Partner Violence:   . Fear of Current or Ex-Partner:   . Emotionally Abused:   Marland Kitchen Physically Abused:   . Sexually Abused:    Family History  Problem Relation Age of Onset  . Alzheimer's disease Mother   . Heart disease Father   . COPD Father   . Colon cancer Father 36  . Prostate cancer Father   . Colon polyps Brother   . Leukemia Brother   . Leukemia Other   . Stomach cancer Neg Hx   . Esophageal cancer Neg Hx   . Rectal cancer Neg Hx        Vanessa Kick, MD 09/19/19 [phone]

## 2019-09-20 ENCOUNTER — Ambulatory Visit (INDEPENDENT_AMBULATORY_CARE_PROVIDER_SITE_OTHER): Payer: Medicare HMO | Admitting: Internal Medicine

## 2019-09-20 ENCOUNTER — Telehealth: Payer: Self-pay

## 2019-09-20 ENCOUNTER — Encounter: Payer: Self-pay | Admitting: Internal Medicine

## 2019-09-20 ENCOUNTER — Other Ambulatory Visit: Payer: Self-pay

## 2019-09-20 DIAGNOSIS — G3184 Mild cognitive impairment, so stated: Secondary | ICD-10-CM | POA: Diagnosis not present

## 2019-09-20 DIAGNOSIS — R69 Illness, unspecified: Secondary | ICD-10-CM | POA: Diagnosis not present

## 2019-09-20 DIAGNOSIS — N410 Acute prostatitis: Secondary | ICD-10-CM | POA: Diagnosis not present

## 2019-09-20 DIAGNOSIS — M79672 Pain in left foot: Secondary | ICD-10-CM | POA: Insufficient documentation

## 2019-09-20 DIAGNOSIS — F39 Unspecified mood [affective] disorder: Secondary | ICD-10-CM

## 2019-09-20 HISTORY — DX: Pain in left foot: M79.672

## 2019-09-20 NOTE — Progress Notes (Signed)
Subjective:    Patient ID: Alejandro Koch, male    DOB: [birth year], 69 y.o.   MRN: [id number]  HPI Here for follow up of memory loss and ER follow up Wife--Deborah --is here This visit occurred during the SARS-CoV-2 public health emergency.  Safety protocols were in place, including screening questions prior to the visit, additional usage of staff PPE, and extensive cleaning of exam room while observing appropriate contact time as indicated for disinfecting solutions.   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 ALPRAZolam (XANAX) 0.5 MG tablet TAKE 1 TABLET (0.5 MG TOTAL) BY MOUTH 3 (THREE) TIMES DAILY AS NEEDED. 90 tablet 0  . atorvastatin (LIPITOR) 20 MG tablet TAKE 1 TABLET (20 MG TOTAL) BY MOUTH DAILY. 90 tablet 3  . Blood Glucose Monitoring Suppl (ONETOUCH VERIO FLEX SYSTEM) w/Device KIT USE DAILY AS DIRECTED 1 kit 0  . cyclobenzaprine (FLEXERIL) 10 MG tablet TAKE 1 TABLET BY MOUTH 3 TIMES DAILY AS NEEDED FOR MUSCLE SPASMS WHEN NOT WORKING OR DRIVING 30 tablet 0  . dapagliflozin propanediol (FARXIGA) 5 MG TABS tablet Take 5 mg by mouth daily. 30 tablet 11  . esomeprazole (NEXIUM) 20 MG capsule Take 40 mg by mouth 2 (two) times daily before a meal.    . Exenatide ER (BYDUREON BCISE) 2 MG/0.85ML AUIJ Inject 2 mg into the skin once a week. 4 pen 5  .  finasteride (PROSCAR) 5 MG tablet TAKE 1 TABLET BY MOUTH EVERY DAY 90 tablet 1  . glipiZIDE (GLUCOTROL XL) 10 MG 24 hr tablet TAKE 1 TABLET (10 MG TOTAL) BY MOUTH DAILY WITH BREAKFAST. 90 tablet 3  . hydrocortisone 2.5 % cream APPLY TOPICALLY 3 (THREE) TIMES DAILY AS NEEDED. 28.35 g 3  . hydrocortisone-pramoxine (ANALPRAM-HC) 2.5-1 % rectal cream Place 1 application rectally 3 (three) times daily as needed. 30 g 3  . ketorolac (ACULAR) 0.5 % ophthalmic solution USE AS DIRECTED 5 mL 1  . meloxicam (MOBIC) 15 MG tablet TAKE 1 TABLET (15 MG TOTAL) BY MOUTH DAILY. WITH A MEAL FOR NECK PAIN 30 tablet 5  . metFORMIN (GLUCOPHAGE-XR) 500 MG 24 hr tablet TAKE 3 TABLETS BY MOUTH DAILY WITH BREAKFAST. 270 tablet 3  . Meth-Hyo-M Bl-Na Phos-Ph Sal (URO-MP) 118 MG CAPS Take 1 capsule by mouth every 6 (six) hours as needed.    Marland Kitchen olmesartan-hydrochlorothiazide (BENICAR HCT) 20-12.5 MG tablet Take 1 tablet by mouth daily. 30 tablet 11  . ondansetron (ZOFRAN-ODT) 4 MG disintegrating tablet Take 1 tablet (4 mg total) by mouth every 8 (eight) hours as needed for nausea or vomiting. 15 tablet 0  . ONETOUCH DELICA LANCETS FINE MISC 1 Units by Does not apply route daily. 100 each 2  . ONETOUCH VERIO test strip USE 1 STRIP DAILY TO CHECK BLOOD SUGAR 50 strip 11  . sulfamethoxazole-trimethoprim (BACTRIM DS) 800-160 MG tablet Take 1 tablet by mouth  2 (two) times daily for 14 days. 28 tablet 0  . tamsulosin (FLOMAX) 0.4 MG CAPS capsule TAKE 1 CAPSULE (0.4 MG TOTAL) BY MOUTH DAILY AFTER SUPPER. 90 capsule 3  . urea (CARMOL) 40 % CREA APPLY PEA SIZED AMOUNT TO HARD AREA BELOW PINKY TOE TWICE A DAY 28.35 g 1  . vitamin B-12 (CYANOCOBALAMIN) 1000 MCG tablet Take 1 tablet (1,000 mcg total) by mouth daily.    Marland Kitchen zolpidem (AMBIEN) 10 MG tablet TAKE 1 TABLET BY MOUTH EVERY DAY AT BEDTIME AS NEEDED FOR SLEEP 30 tablet 0  . albuterol (PROVENTIL HFA;VENTOLIN HFA) 108 (90 Base) MCG/ACT inhaler Inhale into the lungs.    . nitroGLYCERIN  (NITROSTAT) 0.4 MG SL tablet Place under the tongue.     No current facility-administered medications on file prior to visit.    Allergies  Allergen Reactions  . Iohexol      Code: HIVES, Desc: PT IS ALLERGIC TO IVP DYE AS NOTED IN PREVIOUS RECORDS IN PACS 02/2009/RM, Onset Date: [id number]   . Atorvastatin Other (See Comments)    Myalgias with this and rosuvastin  . Metformin And Related Diarrhea    Past Medical History:  Diagnosis Date  . Allergy   . Anemia   . Blood transfusion without reported diagnosis    40 yrs ago   . BPH (benign prostatic hypertrophy)   . CHF (congestive heart failure) ([place])   . Depression   . Diabetes ([place])   . GERD (gastroesophageal reflux disease)   . Glaucoma   . Hyperlipidemia   . Hypertension   . Kidney stones   . Sleep apnea    no cpap     Past Surgical History:  Procedure Laterality Date  . CARDIAC CATHETERIZATION     [facility] - myocard perf and echo done as well - all normal- EF 48-55% 2019 April and june 2019  . COLONOSCOPY  2008  . FINGER SURGERY  2013   infection 2nd finger left hand  . POLYPECTOMY    . REPAIR FLEXOR TENDON HAND Right 08/2016    Family History  Problem Relation Age of Onset  . Alzheimer's disease Mother   . Heart disease Father   . COPD Father   . Colon cancer Father 89  . Prostate cancer Father   . Colon polyps Brother   . Leukemia Brother   . Leukemia Other   . Stomach cancer Neg Hx   . Esophageal cancer Neg Hx   . Rectal cancer Neg Hx     Social History   Socioeconomic History  . Marital status: Married    Spouse name: Not on file  . Number of children: 1  . Years of education: Not on file  . Highest education level: Not on file  Occupational History  . Occupation: Homeowners association management    Comment: retired  . Occupation: Adult nurse estate  Tobacco Use  . Smoking status: Former Smoker    Packs/day: 2.50    Years: 7.00    Pack years: 17.50    Types: Cigarettes    Quit date:  03/18/1983    Years since quitting: 36.5  . Smokeless tobacco: Never Used  Vaping Use  . Vaping Use: Never used  Substance and Sexual Activity  . Alcohol use: Yes    Comment: rare  . Drug use: No  . Sexual activity: Not on file  Other Topics Concern  . Not on file  Social History Narrative   No living will or health  care POA thus far   Wife, then son, should make decisions. May make son the primary person   Would accept resuscitation attempts but wouldn't want to be on machines   No tube feeds if cognitively unaware   Social Determinants of Health   Financial Resource Strain:   . Difficulty of Paying Living Expenses:   Food Insecurity:   . Worried About Charity fundraiser in the Last Year:   . Arboriculturist in the Last Year:   Transportation Needs:   . Film/video editor (Medical):   Marland Kitchen Lack of Transportation (Non-Medical):   Physical Activity:   . Days of Exercise per Week:   . Minutes of Exercise per Session:   Stress:   . Feeling of Stress :   Social Connections:   . Frequency of Communication with Friends and Family:   . Frequency of Social Gatherings with Friends and Family:   . Attends Religious Services:   . Active Member of Clubs or Organizations:   . Attends Archivist Meetings:   Marland Kitchen Marital Status:   Intimate Partner Violence:   . Fear of Current or Ex-Partner:   . Emotionally Abused:   Marland Kitchen Physically Abused:   . Sexually Abused:    Review of Systems Sugars are less labile--not going over 200 now No chest pain or SOB    Objective:   Physical Exam Constitutional:      Appearance: Normal appearance.  Neurological:     Mental Status: He is alert.  Psychiatric:        Mood and Affect: Mood normal.        Behavior: Behavior normal.            Assessment & Plan:

## 2019-09-20 NOTE — Telephone Encounter (Signed)
He was seen in the ED---diagnosed with prostatitis He is better now on the antibiotic

## 2019-09-20 NOTE — Telephone Encounter (Signed)
[facility] Night - Client TELEPHONE ADVICE RECORD [facility] Patient Name: Alejandro Koch Gender: Male DOB: 05-07-1950 Age: 69 Y 99 M 14 D Return Phone Number: [phone] (Primary), [phone] (Secondary) Address: [place] [address] Client [facility] Night - Client Client Site [facility] Physician Viviana Simpler - MD Contact Type Call Who Is Calling Patient / Member / Family / Caregiver Call Type Triage / Clinical Relationship To Patient Self Return Phone Number [phone] (Primary) Chief Complaint Vomiting Reason for Call Symptomatic / Request for Health Information Initial Comment Had vomiting and diarrhea this morning from 2a-6a. Has pain in pelvic and rectal area. Now has urge to have a BM but is unable to go. Translation No Nurse Assessment Nurse: Thad Ranger, RN, Denise Date/Time (Eastern Time): 09/17/2019 11:54:29 AM Confirm and document reason for call. If symptomatic, describe symptoms. ---Had vomiting and diarrhea this morning from 2a-6a. Has pain in pelvic and rectal area. Now has urge to have a BM but is unable to go and no longer vomiting. Has the patient had close contact with a person known or suspected to have the novel coronavirus illness OR traveled / lives in area with major community spread (including international travel) in the last 14 days from the onset of symptoms? * If Asymptomatic, screen for exposure and travel within the last 14 days. ---No Does the patient have any new or worsening symptoms? ---Yes Will a triage be completed? ---Yes Related visit to physician within the last 2 weeks? ---No Does the PT have any chronic conditions? (i.e. diabetes, asthma, this includes High risk factors for pregnancy, etc.) ---Yes List chronic conditions. ---Diabetes, HTN Is this a behavioral health or substance abuse call? ---No Guidelines Guideline Title Affirmed  Question Affirmed Notes Nurse Date/Time (Eastern Time) Vomiting High-risk adult (e.g., diabetes mellitus, brain tumor, V-P shunt, hernia) Carmon, RN, Denise 09/17/2019 11:56:51 AM Disp. Time (Eastern Time) Disposition Final UserPLEASE NOTE: All timestamps contained within this report are represented as [place] Standard Time. CONFIDENTIALTY NOTICE: This fax transmission is intended only for the addressee. It contains information that is legally privileged, confidential or otherwise protected from use or disclosure. If you are not the intended recipient, you are strictly prohibited from reviewing, disclosing, copying using or disseminating any of this information or taking any action in reliance on or regarding this information. If you have received this fax in error, please notify us immediately by telephone so that we can arrange for its return to [place]. Phone: [phone], Toll-Free: [phone], Fax: [phone] Page: 2 of 2 Call Id: [id number] 09/17/2019 11:59:26 AM Go to ED Now (or PCP triage) Yes Thad Ranger, RN, Yevette Edwards Disagree/Comply Comply Caller Understands Yes PreDisposition Call Doctor Care Advice Given Per Guideline GO TO ED NOW (OR PCP TRIAGE): CARE ADVICE per Vomiting (Adult) guideline. Comments User: Romeo Apple, RN Date/Time Eilene Ghazi Time): 09/17/2019 12:01:19 PM Advised to start sipping on CF Referrals [facility]

## 2019-09-20 NOTE — Assessment & Plan Note (Signed)
Still with inflammation around 5th left MTP Doesn't seem to be gout

## 2019-09-20 NOTE — Assessment & Plan Note (Signed)
Improved on the septra Will go for 14 days----extend if symptoms don't go away soon, or if they recur (would go 6 weeks if so)

## 2019-09-20 NOTE — Assessment & Plan Note (Signed)
Seems to have reactive mood problems---always works 60 hours per week and now retired Will avoid medications for now Discussed memantine--not appropriate for now

## 2019-09-20 NOTE — Assessment & Plan Note (Signed)
Had distinct memory loss but no functional changes Appears to have precursor to vascular dementia Discussed social engagement and regular exercise Discussed donepezil--he doesn't want it and I don't think it would help

## 2019-10-01 ENCOUNTER — Other Ambulatory Visit: Payer: Self-pay | Admitting: Internal Medicine

## 2019-10-03 MED ORDER — SULFAMETHOXAZOLE-TRIMETHOPRIM 800-160 MG PO TABS: 1.0000 | ORAL_TABLET | Freq: Two times a day (BID) | ORAL | 0 refills | Status: DC

## 2019-10-03 NOTE — Telephone Encounter (Signed)
Alejandro Koch, Dr [name] does say if he continues to have symptoms, he would extend the rx. He did not sate how many. Can you help? Thanks

## 2019-10-11 ENCOUNTER — Other Ambulatory Visit: Payer: Medicare HMO

## 2019-10-11 DIAGNOSIS — N200 Calculus of kidney: Secondary | ICD-10-CM

## 2019-10-14 ENCOUNTER — Other Ambulatory Visit: Payer: Self-pay | Admitting: Internal Medicine

## 2019-10-14 DIAGNOSIS — R69 Illness, unspecified: Secondary | ICD-10-CM | POA: Diagnosis not present

## 2019-10-15 LAB — STONE ANALYSIS: Stone Weight: 0.225 g

## 2019-10-18 DIAGNOSIS — R0602 Shortness of breath: Secondary | ICD-10-CM | POA: Diagnosis not present

## 2019-11-20 ENCOUNTER — Other Ambulatory Visit: Payer: Self-pay | Admitting: Internal Medicine

## 2019-11-22 NOTE — Telephone Encounter (Signed)
Alprazolam last filled 07-26-19 #90 Zolpidem last filled 07-26-19 #30 Last OV 09-20-19 Next OV 09-20-19 [facility]

## 2019-11-29 ENCOUNTER — Other Ambulatory Visit: Payer: Self-pay

## 2019-11-29 ENCOUNTER — Encounter: Payer: Self-pay | Admitting: Internal Medicine

## 2019-11-29 ENCOUNTER — Ambulatory Visit (INDEPENDENT_AMBULATORY_CARE_PROVIDER_SITE_OTHER): Payer: Medicare HMO | Admitting: Internal Medicine

## 2019-11-29 DIAGNOSIS — R69 Illness, unspecified: Secondary | ICD-10-CM | POA: Diagnosis not present

## 2019-11-29 DIAGNOSIS — E1165 Type 2 diabetes mellitus with hyperglycemia: Secondary | ICD-10-CM

## 2019-11-29 DIAGNOSIS — IMO0002 Reserved for concepts with insufficient information to code with codable children: Secondary | ICD-10-CM

## 2019-11-29 DIAGNOSIS — F39 Unspecified mood [affective] disorder: Secondary | ICD-10-CM

## 2019-11-29 DIAGNOSIS — E1149 Type 2 diabetes mellitus with other diabetic neurological complication: Secondary | ICD-10-CM | POA: Diagnosis not present

## 2019-11-29 DIAGNOSIS — N138 Other obstructive and reflux uropathy: Secondary | ICD-10-CM | POA: Diagnosis not present

## 2019-11-29 DIAGNOSIS — G3184 Mild cognitive impairment, so stated: Secondary | ICD-10-CM | POA: Diagnosis not present

## 2019-11-29 DIAGNOSIS — N401 Enlarged prostate with lower urinary tract symptoms: Secondary | ICD-10-CM

## 2019-11-29 NOTE — Assessment & Plan Note (Signed)
Control still poor Hadn't started the farxiga till recently Glipizide, metformin, weekly bydureon also Will see back in 3 months to recheck

## 2019-11-29 NOTE — Assessment & Plan Note (Signed)
Did pass stone Still with frequency and nocturia on tamsulosin Infection seems resolved

## 2019-11-29 NOTE — Progress Notes (Signed)
Subjective:    Patient ID: Alejandro Koch, male    DOB: 1950/11/23, 69 y.o.   MRN: [id number]  HPI Here with wife for follow up of diabetes and memory loss This visit occurred during the SARS-CoV-2 public health emergency.  Safety protocols were in place, including screening questions prior to the visit, additional usage of staff PPE, and extensive cleaning of exam room while observing appropriate contact time as indicated for disinfecting solutions.   Memory issues are about the same Wife doesn't note any progression No changes in function  Feels he is still having trouble adjusting to retirement Some trouble focusing on things--like getting his taxes done Trouble focusing  "not eating like I should be" Sugars up 180-220 fasting at times---other times it is lower Thought the farxiga was to take the place of the glipizide Started farxiga only 1-2 weeks ago Never stopped the glipizide  Current Outpatient Medications on File Prior to Visit  Medication Sig Dispense Refill  . ALPRAZolam (XANAX) 0.5 MG tablet TAKE 1 TABLET (0.5 MG TOTAL) BY MOUTH 3 (THREE) TIMES DAILY AS NEEDED. 90 tablet 0  . atorvastatin (LIPITOR) 20 MG tablet TAKE 1 TABLET (20 MG TOTAL) BY MOUTH DAILY. 90 tablet 3  . Blood Glucose Monitoring Suppl (ONETOUCH VERIO FLEX SYSTEM) w/Device KIT USE DAILY AS DIRECTED 1 kit 0  . BYDUREON BCISE 2 MG/0.85ML AUIJ INJECT 2 MG INTO THE SKIN ONCE A WEEK. 10.2 mL 11  . cyclobenzaprine (FLEXERIL) 10 MG tablet TAKE 1 TABLET BY MOUTH 3 TIMES DAILY AS NEEDED FOR MUSCLE SPASMS WHEN NOT WORKING OR DRIVING 30 tablet 0  . dapagliflozin propanediol (FARXIGA) 5 MG TABS tablet Take 5 mg by mouth daily. 30 tablet 11  . esomeprazole (NEXIUM) 20 MG capsule Take 40 mg by mouth 2 (two) times daily before a meal.    . finasteride (PROSCAR) 5 MG tablet TAKE 1 TABLET BY MOUTH EVERY DAY 90 tablet 3  . glipiZIDE (GLUCOTROL XL) 10 MG 24 hr tablet TAKE 1 TABLET (10 MG TOTAL) BY MOUTH DAILY WITH BREAKFAST.  90 tablet 3  . hydrocortisone 2.5 % cream APPLY TOPICALLY 3 (THREE) TIMES DAILY AS NEEDED. 28.35 g 3  . hydrocortisone-pramoxine (ANALPRAM-HC) 2.5-1 % rectal cream Place 1 application rectally 3 (three) times daily as needed. 30 g 3  . ketorolac (ACULAR) 0.5 % ophthalmic solution USE AS DIRECTED 5 mL 1  . meloxicam (MOBIC) 15 MG tablet TAKE 1 TABLET (15 MG TOTAL) BY MOUTH DAILY. WITH A MEAL FOR NECK PAIN 30 tablet 5  . metFORMIN (GLUCOPHAGE-XR) 500 MG 24 hr tablet TAKE 3 TABLETS BY MOUTH DAILY WITH BREAKFAST. 270 tablet 3  . Meth-Hyo-M Bl-Na Phos-Ph Sal (URO-MP) 118 MG CAPS Take 1 capsule by mouth every 6 (six) hours as needed.    Marland Kitchen olmesartan-hydrochlorothiazide (BENICAR HCT) 20-12.5 MG tablet Take 1 tablet by mouth daily. 30 tablet 11  . ondansetron (ZOFRAN-ODT) 4 MG disintegrating tablet Take 1 tablet (4 mg total) by mouth every 8 (eight) hours as needed for nausea or vomiting. 15 tablet 0  . ONETOUCH DELICA LANCETS FINE MISC 1 Units by Does not apply route daily. 100 each 2  . ONETOUCH VERIO test strip USE 1 STRIP DAILY TO CHECK BLOOD SUGAR 50 strip 11  . tamsulosin (FLOMAX) 0.4 MG CAPS capsule TAKE 1 CAPSULE (0.4 MG TOTAL) BY MOUTH DAILY AFTER SUPPER. 90 capsule 3  . urea (CARMOL) 40 % CREA APPLY PEA SIZED AMOUNT TO HARD AREA BELOW PINKY TOE TWICE A  DAY 28.35 g 1  . vitamin B-12 (CYANOCOBALAMIN) 1000 MCG tablet Take 1 tablet (1,000 mcg total) by mouth daily.    Marland Kitchen zolpidem (AMBIEN) 10 MG tablet TAKE 1 TABLET BY MOUTH AT BEDTIME AS NEEDED FOR SLEEP 30 tablet 0  . albuterol (PROVENTIL HFA;VENTOLIN HFA) 108 (90 Base) MCG/ACT inhaler Inhale into the lungs.    . nitroGLYCERIN (NITROSTAT) 0.4 MG SL tablet Place under the tongue.     No current facility-administered medications on file prior to visit.    Allergies  Allergen Reactions  . Iohexol      Code: HIVES, Desc: PT IS ALLERGIC TO IVP DYE AS NOTED IN PREVIOUS RECORDS IN PACS 02/2009/RM, Onset Date: [id number]   . Atorvastatin Other (See  Comments)    Myalgias with this and rosuvastin  . Metformin And Related Diarrhea    Past Medical History:  Diagnosis Date  . Allergy   . Anemia   . Blood transfusion without reported diagnosis    40 yrs ago   . BPH (benign prostatic hypertrophy)   . CHF (congestive heart failure) ([place])   . Depression   . Diabetes ([place])   . GERD (gastroesophageal reflux disease)   . Glaucoma   . Hyperlipidemia   . Hypertension   . Kidney stones   . Sleep apnea    no cpap     Past Surgical History:  Procedure Laterality Date  . CARDIAC CATHETERIZATION     [facility] - myocard perf and echo done as well - all normal- EF 48-55% 2019 April and june 2019  . COLONOSCOPY  2008  . FINGER SURGERY  2013   infection 2nd finger left hand  . POLYPECTOMY    . REPAIR FLEXOR TENDON HAND Right 08/2016    Family History  Problem Relation Age of Onset  . Alzheimer's disease Mother   . Heart disease Father   . COPD Father   . Colon cancer Father 5  . Prostate cancer Father   . Colon polyps Brother   . Leukemia Brother   . Leukemia Other   . Stomach cancer Neg Hx   . Esophageal cancer Neg Hx   . Rectal cancer Neg Hx     Social History   Socioeconomic History  . Marital status: Married    Spouse name: Not on file  . Number of children: 1  . Years of education: Not on file  . Highest education level: Not on file  Occupational History  . Occupation: Homeowners association management    Comment: retired  . Occupation: Adult nurse estate  Tobacco Use  . Smoking status: Former Smoker    Packs/day: 2.50    Years: 7.00    Pack years: 17.50    Types: Cigarettes    Quit date: 03/18/1983    Years since quitting: 36.7  . Smokeless tobacco: Never Used  Vaping Use  . Vaping Use: Never used  Substance and Sexual Activity  . Alcohol use: Yes    Comment: rare  . Drug use: No  . Sexual activity: Not on file  Other Topics Concern  . Not on file  Social History Narrative   No living will or health  care POA thus far   Wife, then son, should make decisions. May make son the primary person   Would accept resuscitation attempts but wouldn't want to be on machines   No tube feeds if cognitively unaware   Social Determinants of Health   Financial Resource Strain:   .  Difficulty of Paying Living Expenses: Not on file  Food Insecurity:   . Worried About Charity fundraiser in the Last Year: Not on file  . Ran Out of Food in the Last Year: Not on file  Transportation Needs:   . Lack of Transportation (Medical): Not on file  . Lack of Transportation (Non-Medical): Not on file  Physical Activity:   . Days of Exercise per Week: Not on file  . Minutes of Exercise per Session: Not on file  Stress:   . Feeling of Stress : Not on file  Social Connections:   . Frequency of Communication with Friends and Family: Not on file  . Frequency of Social Gatherings with Friends and Family: Not on file  . Attends Religious Services: Not on file  . Active Member of Clubs or Organizations: Not on file  . Attends Archivist Meetings: Not on file  . Marital Status: Not on file  Intimate Partner Violence:   . Fear of Current or Ex-Partner: Not on file  . Emotionally Abused: Not on file  . Physically Abused: Not on file  . Sexually Abused: Not on file   Review of Systems  Breathing is better ---off the breo (which he hadn't been taking regularly) Weight is down a few pounds Not sleeping well--restarted ambien in past week (plus xanax) Nocturia x 2 most nights     Objective:   Physical Exam Constitutional:      Appearance: Normal appearance.  Cardiovascular:     Rate and Rhythm: Normal rate and regular rhythm.     Pulses: Normal pulses.     Heart sounds: No murmur heard.  No gallop.   Pulmonary:     Effort: Pulmonary effort is normal.     Breath sounds: Normal breath sounds. No wheezing or rales.  Musculoskeletal:     Right lower leg: No edema.     Left lower leg: No edema.    Skin:    Comments: No foot lesions  Neurological:     Mental Status: He is alert.  Psychiatric:        Mood and Affect: Mood normal.        Behavior: Behavior normal.            Assessment & Plan:

## 2019-11-29 NOTE — Assessment & Plan Note (Signed)
Still adjusting to retirement Trouble with concentration--?related to cognition

## 2019-11-29 NOTE — Assessment & Plan Note (Signed)
Stable Likely from vascular damage Is on atorvastatin 20mg  daily Missed neurology appt---can hold off for now

## 2019-12-04 DIAGNOSIS — R69 Illness, unspecified: Secondary | ICD-10-CM | POA: Diagnosis not present

## 2019-12-30 NOTE — Telephone Encounter (Signed)
Okay to refill his bydureon for a year. See if his insurance will cover one of the CGM systems (or just send Rx so he can check). I agree this might help him but I am not sure if it would be covered

## 2020-01-04 ENCOUNTER — Other Ambulatory Visit: Payer: Self-pay | Admitting: Internal Medicine

## 2020-01-07 ENCOUNTER — Other Ambulatory Visit: Payer: Self-pay

## 2020-01-07 ENCOUNTER — Ambulatory Visit (INDEPENDENT_AMBULATORY_CARE_PROVIDER_SITE_OTHER): Payer: Medicare HMO

## 2020-01-07 DIAGNOSIS — Z23 Encounter for immunization: Secondary | ICD-10-CM | POA: Diagnosis not present

## 2020-01-11 DIAGNOSIS — G4700 Insomnia, unspecified: Secondary | ICD-10-CM | POA: Diagnosis not present

## 2020-01-11 DIAGNOSIS — K219 Gastro-esophageal reflux disease without esophagitis: Secondary | ICD-10-CM | POA: Diagnosis not present

## 2020-01-11 DIAGNOSIS — E669 Obesity, unspecified: Secondary | ICD-10-CM | POA: Diagnosis not present

## 2020-01-11 DIAGNOSIS — I10 Essential (primary) hypertension: Secondary | ICD-10-CM | POA: Diagnosis not present

## 2020-01-11 DIAGNOSIS — E11620 Type 2 diabetes mellitus with diabetic dermatitis: Secondary | ICD-10-CM | POA: Diagnosis not present

## 2020-01-11 DIAGNOSIS — H04129 Dry eye syndrome of unspecified lacrimal gland: Secondary | ICD-10-CM | POA: Diagnosis not present

## 2020-01-11 DIAGNOSIS — E785 Hyperlipidemia, unspecified: Secondary | ICD-10-CM | POA: Diagnosis not present

## 2020-01-11 DIAGNOSIS — I25119 Atherosclerotic heart disease of native coronary artery with unspecified angina pectoris: Secondary | ICD-10-CM | POA: Diagnosis not present

## 2020-01-11 DIAGNOSIS — G4730 Sleep apnea, unspecified: Secondary | ICD-10-CM | POA: Diagnosis not present

## 2020-01-11 DIAGNOSIS — R69 Illness, unspecified: Secondary | ICD-10-CM | POA: Diagnosis not present

## 2020-01-17 ENCOUNTER — Other Ambulatory Visit: Payer: Self-pay | Admitting: Internal Medicine

## 2020-01-18 NOTE — Telephone Encounter (Signed)
Last filled 11-23-19 #90 Last OV 11-29-19 Next OV 02-28-20 [facility]

## 2020-01-27 DIAGNOSIS — R69 Illness, unspecified: Secondary | ICD-10-CM | POA: Diagnosis not present

## 2020-02-18 ENCOUNTER — Other Ambulatory Visit: Payer: Self-pay | Admitting: Internal Medicine

## 2020-02-28 ENCOUNTER — Encounter: Payer: Self-pay | Admitting: Internal Medicine

## 2020-02-28 ENCOUNTER — Other Ambulatory Visit: Payer: Self-pay

## 2020-02-28 ENCOUNTER — Ambulatory Visit (INDEPENDENT_AMBULATORY_CARE_PROVIDER_SITE_OTHER): Payer: Medicare HMO | Admitting: Internal Medicine

## 2020-02-28 VITALS — BP 116/66 | HR 87 | Temp 97.5°F | Ht 76.0 in | Wt 264.0 lb

## 2020-02-28 DIAGNOSIS — E1149 Type 2 diabetes mellitus with other diabetic neurological complication: Secondary | ICD-10-CM

## 2020-02-28 DIAGNOSIS — I10 Essential (primary) hypertension: Secondary | ICD-10-CM

## 2020-02-28 DIAGNOSIS — G3184 Mild cognitive impairment, so stated: Secondary | ICD-10-CM | POA: Diagnosis not present

## 2020-02-28 DIAGNOSIS — E1165 Type 2 diabetes mellitus with hyperglycemia: Secondary | ICD-10-CM | POA: Diagnosis not present

## 2020-02-28 DIAGNOSIS — IMO0002 Reserved for concepts with insufficient information to code with codable children: Secondary | ICD-10-CM

## 2020-02-28 LAB — POCT GLYCOSYLATED HEMOGLOBIN (HGB A1C): Hemoglobin A1C: 7.9 % — AB (ref 4.0–5.6)

## 2020-02-28 NOTE — Progress Notes (Signed)
Subjective:    Patient ID: Alejandro Koch, male    DOB: 08-12-1950, 69 y.o.   MRN: [id number]  HPI Here for follow up of uncontrolled diabetes and cognitive issues This visit occurred during the SARS-CoV-2 public health emergency.  Safety protocols were in place, including screening questions prior to the visit, additional usage of staff PPE, and extensive cleaning of exam room while observing appropriate contact time as indicated for disinfecting solutions.   Memory may have gotten some worse Got prevagen but didn't take it yet Mostly recall issues--"I'm losing my edge" Wife notes some decline as well Takes a long time to do some of his normal activities  Checking sugars every morning As low as 80--but 160-200 at times bydureon weekly Glipizide, farxiga and metformin now  Continues on BP meds No apparent problems No chest pain or SOB Has had dizziness---tried to walk through it but couldn't. Wife found him on the ground--but not unconcious  Current Outpatient Medications on File Prior to Visit  Medication Sig Dispense Refill  . ALPRAZolam (XANAX) 0.5 MG tablet TAKE 1 TABLET (0.5 MG TOTAL) BY MOUTH 3 (THREE) TIMES DAILY AS NEEDED. 90 tablet 0  . atorvastatin (LIPITOR) 20 MG tablet TAKE 1 TABLET (20 MG TOTAL) BY MOUTH DAILY. 90 tablet 3  . Blood Glucose Monitoring Suppl (ONETOUCH VERIO REFLECT) w/Device KIT USE DAILY AS DIRECTED 1 kit 0  . BYDUREON BCISE 2 MG/0.85ML AUIJ INJECT 2 MG INTO THE SKIN ONCE A WEEK. 10.2 mL 11  . esomeprazole (NEXIUM) 20 MG capsule Take 40 mg by mouth 2 (two) times daily before a meal.    . FARXIGA 5 MG TABS tablet TAKE 1 TABLET BY MOUTH EVERY DAY 30 tablet 11  . finasteride (PROSCAR) 5 MG tablet TAKE 1 TABLET BY MOUTH EVERY DAY 90 tablet 3  . glipiZIDE (GLUCOTROL XL) 10 MG 24 hr tablet TAKE 1 TABLET (10 MG TOTAL) BY MOUTH DAILY WITH BREAKFAST. 90 tablet 3  . hydrocortisone 2.5 % cream APPLY TOPICALLY 3 (THREE) TIMES DAILY AS NEEDED. 28 g 3  .  hydrocortisone-pramoxine (ANALPRAM-HC) 2.5-1 % rectal cream Place 1 application rectally 3 (three) times daily as needed. 30 g 3  . ketorolac (ACULAR) 0.5 % ophthalmic solution USE AS DIRECTED 5 mL 1  . metFORMIN (GLUCOPHAGE-XR) 500 MG 24 hr tablet TAKE 3 TABLETS BY MOUTH DAILY WITH BREAKFAST. 270 tablet 3  . Meth-Hyo-M Bl-Na Phos-Ph Sal (URO-MP) 118 MG CAPS Take 1 capsule by mouth every 6 (six) hours as needed.    Marland Kitchen olmesartan-hydrochlorothiazide (BENICAR HCT) 20-12.5 MG tablet Take 1 tablet by mouth daily. 30 tablet 11  . ONETOUCH DELICA LANCETS FINE MISC 1 Units by Does not apply route daily. 100 each 2  . ONETOUCH VERIO test strip USE 1 STRIP DAILY TO CHECK BLOOD SUGAR 50 strip 11  . tamsulosin (FLOMAX) 0.4 MG CAPS capsule TAKE 1 CAPSULE (0.4 MG TOTAL) BY MOUTH DAILY AFTER SUPPER. 90 capsule 3  . urea (CARMOL) 40 % CREA APPLY PEA SIZED AMOUNT TO HARD AREA BELOW PINKY TOE TWICE A DAY 28.35 g 1  . vitamin B-12 (CYANOCOBALAMIN) 1000 MCG tablet Take 1 tablet (1,000 mcg total) by mouth daily.    Marland Kitchen zolpidem (AMBIEN) 10 MG tablet TAKE 1 TABLET BY MOUTH AT BEDTIME AS NEEDED FOR SLEEP 30 tablet 0  . albuterol (PROVENTIL HFA;VENTOLIN HFA) 108 (90 Base) MCG/ACT inhaler Inhale into the lungs.    . nitroGLYCERIN (NITROSTAT) 0.4 MG SL tablet Place under the tongue.  No current facility-administered medications on file prior to visit.    Allergies  Allergen Reactions  . Iohexol      Code: HIVES, Desc: PT IS ALLERGIC TO IVP DYE AS NOTED IN PREVIOUS RECORDS IN PACS 02/2009/RM, Onset Date: [id number]   . Atorvastatin Other (See Comments)    Myalgias with this and rosuvastin  . Metformin And Related Diarrhea    Past Medical History:  Diagnosis Date  . Allergy   . Anemia   . Blood transfusion without reported diagnosis    40 yrs ago   . BPH (benign prostatic hypertrophy)   . CHF (congestive heart failure) ([place])   . Depression   . Diabetes ([place])   . GERD (gastroesophageal reflux disease)   .  Glaucoma   . Hyperlipidemia   . Hypertension   . Kidney stones   . Sleep apnea    no cpap     Past Surgical History:  Procedure Laterality Date  . CARDIAC CATHETERIZATION     [facility] - myocard perf and echo done as well - all normal- EF 48-55% 2019 April and june 2019  . COLONOSCOPY  2008  . FINGER SURGERY  2013   infection 2nd finger left hand  . POLYPECTOMY    . REPAIR FLEXOR TENDON HAND Right 08/2016    Family History  Problem Relation Age of Onset  . Alzheimer's disease Mother   . Heart disease Father   . COPD Father   . Colon cancer Father 59  . Prostate cancer Father   . Colon polyps Brother   . Leukemia Brother   . Leukemia Other   . Stomach cancer Neg Hx   . Esophageal cancer Neg Hx   . Rectal cancer Neg Hx     Social History   Socioeconomic History  . Marital status: Married    Spouse name: Not on file  . Number of children: 1  . Years of education: Not on file  . Highest education level: Not on file  Occupational History  . Occupation: Homeowners association management    Comment: retired  . Occupation: Adult nurse estate  Tobacco Use  . Smoking status: Former Smoker    Packs/day: 2.50    Years: 7.00    Pack years: 17.50    Types: Cigarettes    Quit date: 03/18/1983    Years since quitting: 36.9  . Smokeless tobacco: Never Used  Vaping Use  . Vaping Use: Never used  Substance and Sexual Activity  . Alcohol use: Yes    Comment: rare  . Drug use: No  . Sexual activity: Not on file  Other Topics Concern  . Not on file  Social History Narrative   No living will or health care POA thus far   Wife, then son, should make decisions. May make son the primary person   Would accept resuscitation attempts but wouldn't want to be on machines   No tube feeds if cognitively unaware   Social Determinants of Health   Financial Resource Strain: Not on file  Food Insecurity: Not on file  Transportation Needs: Not on file  Physical Activity: Not on file   Stress: Not on file  Social Connections: Not on file  Intimate Partner Violence: Not on file   Review of Systems Appetite is not good Has lost a couple of pounds Not really exercising Takes ambien once a week at most    Objective:   Physical Exam Constitutional:      Appearance: Normal  appearance.  Cardiovascular:     Rate and Rhythm: Normal rate and regular rhythm.     Pulses: Normal pulses.     Heart sounds: No murmur heard. No gallop.   Pulmonary:     Effort: Pulmonary effort is normal.     Breath sounds: Normal breath sounds. No wheezing or rales.  Musculoskeletal:     Cervical back: Neck supple.     Right lower leg: No edema.     Left lower leg: No edema.  Lymphadenopathy:     Cervical: No cervical adenopathy.  Skin:    Comments: Slight plantar callous but no ulcers  Neurological:     Mental Status: He is alert.  Psychiatric:        Mood and Affect: Mood normal.        Behavior: Behavior normal.            Assessment & Plan:

## 2020-02-28 NOTE — Assessment & Plan Note (Signed)
BP Readings from Last 3 Encounters:  02/28/20 116/66  11/29/19 106/70  09/20/19 132/80   Had apparent orthostatic spell lately--but will continue olmesartan/HCTZ

## 2020-02-28 NOTE — Assessment & Plan Note (Signed)
Has declined Likely early vascular dementia Will try to get the brain MRI now Try again with neurologist

## 2020-02-28 NOTE — Assessment & Plan Note (Signed)
Lab Results  Component Value Date   HGBA1C 7.9 (A) 02/28/2020   Better and now acceptable Discussed adding exercise Continue bydureon, farxiga, glipizide and metformin

## 2020-03-02 ENCOUNTER — Telehealth: Payer: Self-pay

## 2020-03-02 NOTE — Telephone Encounter (Signed)
Dr. [name], I received notification from patient's insurance that they need to do peer to peer conversation for MRI request. I did submit the clinical notes and they reviewed them but still need to speak with you.  I have to schedule time when you can talk to them. Case will close on 12/21. Please let me know when you can speak with them and I will call and get this scheduled on Monday.  Their phone number is [phone] option 1. Just let me know when you can. Thank you

## 2020-03-04 ENCOUNTER — Other Ambulatory Visit: Payer: Self-pay | Admitting: Internal Medicine

## 2020-03-04 NOTE — Telephone Encounter (Signed)
I should have time between 1 & 2 on Monday

## 2020-03-05 NOTE — Telephone Encounter (Signed)
If it is on my cell phone I should be able to take it---but I will be in my car or at [facility] by then 1PM would be better

## 2020-03-05 NOTE — Telephone Encounter (Signed)
Called peer to peer department this morning. I was told they do not have available time today for a provider to speak with you, they are booked. I advised the representative that on Friday I was told that when I call to schedule we would just need to give them 30 minutes in advance to have someone ready to speak with. They apologized for the misinformation but could not schedule for today.  I had to schedule for 03/06/20 at 1:30 pm. I can reschedule if this does not work for you. Dr Lynnell Chad will call you on your cell phone to discuss this case.  Case# [id number] Member ID: [id number] CPT code [phone]

## 2020-03-05 NOTE — Telephone Encounter (Signed)
Alprazolam last filled 01-29-20 #90 Zolpidem last filled 11-23-19 #30 Last OV 02-28-20 Next OV 08-29-20 [facility]

## 2020-03-05 NOTE — Telephone Encounter (Signed)
I called and rescheduled for 1 pm tomorrow and Dr Jennefer Bravo from insurance will call you on the cell phone to discuss

## 2020-03-06 ENCOUNTER — Telehealth: Payer: Self-pay

## 2020-03-06 NOTE — Telephone Encounter (Signed)
I just had the peer to peer and the MRI was approved (they are faxing over approval) Let's get it scheduled

## 2020-03-06 NOTE — Telephone Encounter (Signed)
PA for zolpidem denied due to high risk for people over 65 who have not tried and failed doxepin 3 and 6 mg.

## 2020-03-06 NOTE — Telephone Encounter (Signed)
Spoke to pt. He will try it melatonin. I removed zolpidem from his list.

## 2020-03-06 NOTE — Telephone Encounter (Signed)
Patient scheduled for first available on 03/14/20, patient advised and mychart message sent with all of the information per patient's request

## 2020-03-06 NOTE — Telephone Encounter (Signed)
Let him know I am concerned that it could be leading to some of his memory issues---even though he doesn't take it daily I would recommend trying melatonin 5mg ---and consider increasing to 10mg 

## 2020-03-14 ENCOUNTER — Ambulatory Visit
Admission: RE | Admit: 2020-03-14 | Discharge: 2020-03-14 | Disposition: A | Discharge status: Home / Self Care | Payer: Medicare HMO | Source: Ambulatory Visit | Attending: Internal Medicine | Admitting: Internal Medicine

## 2020-03-14 ENCOUNTER — Other Ambulatory Visit: Payer: Self-pay

## 2020-03-14 DIAGNOSIS — G3184 Mild cognitive impairment, so stated: Secondary | ICD-10-CM | POA: Diagnosis not present

## 2020-03-14 DIAGNOSIS — G319 Degenerative disease of nervous system, unspecified: Secondary | ICD-10-CM | POA: Diagnosis not present

## 2020-03-14 DIAGNOSIS — R69 Illness, unspecified: Secondary | ICD-10-CM | POA: Diagnosis not present

## 2020-03-20 ENCOUNTER — Encounter: Payer: Self-pay | Admitting: Neurology

## 2020-03-26 ENCOUNTER — Other Ambulatory Visit: Payer: Self-pay | Admitting: Internal Medicine

## 2020-04-12 ENCOUNTER — Other Ambulatory Visit: Payer: Self-pay | Admitting: Internal Medicine

## 2020-04-13 NOTE — Telephone Encounter (Signed)
Alprazolam last filled 03-05-20 #90 Last OV 02-28-20 Next OV 08-29-20 [facility]

## 2020-05-08 MED ORDER — METFORMIN HCL ER 500 MG PO TB24: ORAL_TABLET | ORAL | 0 refills | Status: DC

## 2020-05-15 ENCOUNTER — Encounter: Payer: Self-pay | Admitting: Neurology

## 2020-05-15 ENCOUNTER — Other Ambulatory Visit: Payer: Self-pay

## 2020-05-15 ENCOUNTER — Ambulatory Visit: Payer: Medicare HMO | Admitting: Neurology

## 2020-05-15 VITALS — BP 108/71 | HR 99 | Ht 77.0 in | Wt 260.0 lb

## 2020-05-15 DIAGNOSIS — G3184 Mild cognitive impairment, so stated: Secondary | ICD-10-CM

## 2020-05-15 NOTE — Patient Instructions (Signed)
Good to meet you!  1. Schedule Neurocognitive testing. If any changes in recommendations after the testing, we can discuss it over the phone.  2. There are some activities which have therapeutic value and can be useful in keeping you cognitively stimulated. You can try this website: https://www.[facility].org/get-to-know-barrow/centers-programs/neurorehabilitation-center/neuro-rehab-apps-and-games/ which has options, categorized by level of difficulty.  3. Follow-up in 6-8 months, call for any changes   You have been referred for a neuropsychological evaluation (i.e., evaluation of memory and thinking abilities). Please bring someone with you to this appointment if possible, as it is helpful for the doctor to hear from both you and another adult who knows you well. Please bring eyeglasses and hearing aids if you wear them.    The evaluation will take approximately 3 hours and has two parts:   . The first part is a clinical interview with the neuropsychologist (Dr. Melvyn Novas or Dr. Nicole Kindred). During the interview, the neuropsychologist will speak with you and the individual you brought to the appointment.    . The second part of the evaluation is testing with the doctor's technician Hinton Dyer or Maudie Mercury). During the testing, the technician will ask you to remember different types of material, solve problems, and answer some questionnaires. Your family member will not be present for this portion of the evaluation.   Please note: We must reserve several hours of the neuropsychologist's time and the psychometrician's time for your evaluation appointment. As such, there is a No-Show fee of $100. If you are unable to attend any of your appointments, please contact our office as soon as possible to reschedule.    RECOMMENDATIONS FOR ALL PATIENTS WITH MEMORY PROBLEMS: 1. Continue to exercise (Recommend 30 minutes of walking everyday, or 3 hours every week) 2. Increase social interactions - continue going to [place]  and enjoy social gatherings with friends and family 3. Eat healthy, avoid fried foods and eat more fruits and vegetables 4. Maintain adequate blood pressure, blood sugar, and blood cholesterol level. Reducing the risk of stroke and cardiovascular disease also helps promoting better memory. 5. Avoid stressful situations. Live a simple life and avoid aggravations. Organize your time and prepare for the next day in anticipation. 6. Sleep well, avoid any interruptions of sleep and avoid any distractions in the bedroom that may interfere with adequate sleep quality 7. Avoid sugar, avoid sweets as there is a strong link between excessive sugar intake, diabetes, and cognitive impairment We discussed the Mediterranean diet, which has been shown to help patients reduce the risk of progressive memory disorders and reduces cardiovascular risk. This includes eating fish, eat fruits and green leafy vegetables, nuts like almonds and hazelnuts, walnuts, and also use olive oil. Avoid fast foods and fried foods as much as possible. Avoid sweets and sugar as sugar use has been linked to worsening of memory function.

## 2020-05-15 NOTE — Progress Notes (Signed)
NEUROLOGY CONSULTATION NOTE  Alejandro Koch MRN: [id number] DOB: Sep 02, 1950  Referring provider: Dr. Viviana Simpler Primary care provider: Dr. Viviana Simpler  Reason for consult:  Memory loss  Dear Dr [name]:  Thank you for your kind referral of Alejandro Koch for consultation of the above symptoms. Although his history is well known to you, please allow me to reiterate it for the purpose of our medical record. The patient was accompanied to the clinic by his wife Alejandro Koch who also provides collateral information. Records and images were personally reviewed where available.   HISTORY OF PRESENT ILLNESS: This is a 70 year old right-handed man with a history of hypertension, hyperlipidemia, OSA, diabetes, anxiety, depression, presenting for evaluation of memory loss. He feels hie memory is not like it's supposed to be, he used to be pretty sharp and great with numbers. He lives with his wife. His wife started noticing changes about a year ago, he started having mood changes, a lot of edgy when he is usually even-keel. He was losing things frequently and appetite was not like it was. He was losing weight and reporting dizziness. He would repeat himself and get frustrated easily. She noticed his attention span would not be longer than 10-15 minutes, he would get easily distracted. 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 He has occasional word-finding difficulties when he used to be pretty accurate. He denies getting lost driving but has trouble remembering directions to the beach and questions himself more now. He denies missing medications or bill payments. His mother had Alzheimer's disease, his father had memory issues. He rarely drinks alcohol. He had a fall last Christmas where he lost consciousness. No neurosurgical procedures. He thinks his mood is better, he was in a very stressful work environment but did notice he used to be more tolerant but is now  more irritable. His wife denies any paranoia or hallucinations.  He states he never had headaches in the past, but over the last 3 months he has had a low grade frontal headache that would occur infrequently. No associated nausea/vomiting. He has dizziness when sitting but more when standing. He describes a spinning sensation where he has to hold on for a few seconds to get his bearings, no falls. He denies any diplopia, dysarthria/dysphagia, neck/back pain, focal numbness/tingling/weakness, bowel/bladder dysfunction, anosmia, or tremors. Sleep is good with Xanax every night.   I personally reviewed MRI brain without contrast done 02/2020 which did not show any acute changes, there was mild diffuse atrophy.  Laboratory Data: Lab Results  Component Value Date   TSH 2.24 06/09/2017   Lab Results  Component Value Date   [id number] 07/20/2019     PAST MEDICAL HISTORY: Past Medical History:  Diagnosis Date  . Allergy   . Anemia   . Blood transfusion without reported diagnosis    40 yrs ago   . BPH (benign prostatic hypertrophy)   . CHF (congestive heart failure) ([place])   . Depression   . Diabetes ([place])   . GERD (gastroesophageal reflux disease)   . Glaucoma   . Hyperlipidemia   . Hypertension   . Kidney stones   . Sleep apnea    no cpap     PAST SURGICAL HISTORY: Past Surgical History:  Procedure Laterality Date  . CARDIAC CATHETERIZATION     [facility] - myocard perf and echo done as well - all normal- EF 48-55% 2019 April and june 2019  .  COLONOSCOPY  2008  . FINGER SURGERY  2013   infection 2nd finger left hand  . POLYPECTOMY    . REPAIR FLEXOR TENDON HAND Right 08/2016    MEDICATIONS: Current Outpatient Medications on File Prior to Visit  Medication Sig Dispense Refill  . ALPRAZolam (XANAX) 0.5 MG tablet TAKE 1 TABLET BY MOUTH THREE TIMES A DAY AS NEEDED 90 tablet 0  . atorvastatin (LIPITOR) 20 MG tablet TAKE 1 TABLET (20 MG TOTAL) BY MOUTH DAILY. 90 tablet 3  .  Blood Glucose Monitoring Suppl (ONETOUCH VERIO REFLECT) w/Device KIT USE DAILY AS DIRECTED 1 kit 0  . BYDUREON BCISE 2 MG/0.85ML AUIJ INJECT 2 MG INTO THE SKIN ONCE A WEEK. 10.2 mL 11  . esomeprazole (NEXIUM) 20 MG capsule Take 40 mg by mouth 2 (two) times daily before a meal.    . FARXIGA 5 MG TABS tablet TAKE 1 TABLET BY MOUTH EVERY DAY 30 tablet 11  . finasteride (PROSCAR) 5 MG tablet TAKE 1 TABLET BY MOUTH EVERY DAY 90 tablet 3  . glipiZIDE (GLUCOTROL XL) 10 MG 24 hr tablet TAKE 1 TABLET (10 MG TOTAL) BY MOUTH DAILY WITH BREAKFAST. 90 tablet 3  . hydrocortisone 2.5 % cream APPLY TOPICALLY 3 (THREE) TIMES DAILY AS NEEDED. 28 g 3  . hydrocortisone-pramoxine (ANALPRAM-HC) 2.5-1 % rectal cream Place 1 application rectally 3 (three) times daily as needed. 30 g 3  . ketorolac (ACULAR) 0.5 % ophthalmic solution USE AS DIRECTED 5 mL 1  . metFORMIN (GLUCOPHAGE-XR) 500 MG 24 hr tablet TAKE 3 TABLETS BY MOUTH DAILY WITH BREAKFAST. 21 tablet 0  . Meth-Hyo-M Bl-Na Phos-Ph Sal (URO-MP) 118 MG CAPS Take 1 capsule by mouth every 6 (six) hours as needed.    Marland Kitchen olmesartan-hydrochlorothiazide (BENICAR HCT) 20-12.5 MG tablet Take 1 tablet by mouth daily. 30 tablet 11  . ONETOUCH DELICA LANCETS FINE MISC 1 Units by Does not apply route daily. 100 each 2  . ONETOUCH VERIO test strip USE 1 STRIP DAILY TO CHECK BLOOD SUGAR 50 strip 11  . tamsulosin (FLOMAX) 0.4 MG CAPS capsule TAKE 1 CAPSULE (0.4 MG TOTAL) BY MOUTH DAILY AFTER SUPPER. 90 capsule 3  . urea (CARMOL) 40 % CREA APPLY PEA SIZED AMOUNT TO HARD AREA BELOW PINKY TOE TWICE A DAY 28.35 g 1  . vitamin B-12 (CYANOCOBALAMIN) 1000 MCG tablet Take 1 tablet (1,000 mcg total) by mouth daily.    Marland Kitchen albuterol (PROVENTIL HFA;VENTOLIN HFA) 108 (90 Base) MCG/ACT inhaler Inhale into the lungs.    . nitroGLYCERIN (NITROSTAT) 0.4 MG SL tablet Place under the tongue.     No current facility-administered medications on file prior to visit.    ALLERGIES: Allergies  Allergen  Reactions  . Iohexol      Code: HIVES, Desc: PT IS ALLERGIC TO IVP DYE AS NOTED IN PREVIOUS RECORDS IN PACS 02/2009/RM, Onset Date: [id number]   . Atorvastatin Other (See Comments)    Myalgias with this and rosuvastin  . Metformin And Related Diarrhea    FAMILY HISTORY: Family History  Problem Relation Age of Onset  . Alzheimer's disease Mother   . Heart disease Father   . COPD Father   . Colon cancer Father 47  . Prostate cancer Father   . Colon polyps Brother   . Leukemia Brother   . Leukemia Other   . Stomach cancer Neg Hx   . Esophageal cancer Neg Hx   . Rectal cancer Neg Hx     SOCIAL HISTORY: Social History  Socioeconomic History  . Marital status: Married    Spouse name: Not on file  . Number of children: 1  . Years of education: Not on file  . Highest education level: Not on file  Occupational History  . Occupation: Homeowners association management    Comment: retired  . Occupation: Adult nurse estate  Tobacco Use  . Smoking status: Former Smoker    Packs/day: 2.50    Years: 7.00    Pack years: 17.50    Types: Cigarettes    Quit date: 03/18/1983    Years since quitting: 37.1  . Smokeless tobacco: Never Used  Vaping Use  . Vaping Use: Never used  Substance and Sexual Activity  . Alcohol use: Yes    Comment: rare  . Drug use: No  . Sexual activity: Not on file  Other Topics Concern  . Not on file  Social History Narrative   No living will or health care POA thus far   Wife, then son, should make decisions. May make son the primary person   Would accept resuscitation attempts but wouldn't want to be on machines   No tube feeds if cognitively unaware   Social Determinants of Health   Financial Resource Strain: Not on file  Food Insecurity: Not on file  Transportation Needs: Not on file  Physical Activity: Not on file  Stress: Not on file  Social Connections: Not on file  Intimate Partner Violence: Not on file     PHYSICAL EXAM: Vitals:    05/15/20 1410  BP: 108/71  Pulse: 99  SpO2: 97%   General: No acute distress Head:  Normocephalic/atraumatic Skin/Extremities: No rash, no edema Neurological Exam: Mental status: alert and oriented to person, place, and time, no dysarthria or aphasia, Fund of knowledge is appropriate.  Recent and remote memory are impaired.  Attention and concentration are normal.    Able to name objects and repeat phrases. Pomeroy score 21/30 Montreal Cognitive Assessment  05/15/2020  Visuospatial/ Executive (0/5) 3  Naming (0/3) 3  Attention: Read list of digits (0/2) 2  Attention: Read list of letters (0/1) 1  Attention: Serial 7 subtraction starting at 100 (0/3) 2  Language: Repeat phrase (0/2) 2  Language : Fluency (0/1) 0  Abstraction (0/2) 2  Delayed Recall (0/5) 0  Orientation (0/6) 6  Total 21    Cranial nerves: CN I: not tested CN II: pupils equal, round and reactive to light, visual fields intact CN III, IV, VI:  full range of motion, no nystagmus, no ptosis CN V: facial sensation intact CN VII: upper and lower face symmetric CN VIII: hearing intact to conversation CN XI: sternocleidomastoid and trapezius muscles intact CN XII: tongue midline Bulk & Tone: normal, no fasciculations. Motor: 5/5 throughout with no pronator drift. Sensation: intact to light touch, cold, pin on both UE, decreased cold, pin, vibration sense to ankles bilaterally.  No extinction to double simultaneous stimulation.  Romberg test negative Deep Tendon Reflexes: +1 both UE, unable to elicit on both LE, no ankle clonus Plantar responses: downgoing bilaterally Cerebellar: no incoordination on finger to nose testing Gait: narrow-based and steady, difficulty with tandem walk Tremor: none   IMPRESSION: This is a 70 year old right-handed man with a history of hypertension, hyperlipidemia, OSA, diabetes, anxiety, depression, presenting for evaluation of memory loss. Neurological exam non-focal, there is mild  length-dependent neuropathy. MOCA score 21/30, he denies any difficulties with complex tasks suggestive of Mild Cognitive Impairment. MRI brain showed mild diffuse atrophy,  no significant chronic microvascular disease. We discussed doing Neurocognitive testing to further evaluate cognitive concerns. We discussed the importance of control of vascular risk factors, physical exercise, and brain stimulation exercises for brain health. Follow-up in 6-8 months, they know to call for any changes.   Thank you for allowing me to participate in the care of this patient. Please do not hesitate to call for any questions or concerns.   Ellouise Newer, M.D.  CC: Dr. [name]

## 2020-05-18 ENCOUNTER — Ambulatory Visit (INDEPENDENT_AMBULATORY_CARE_PROVIDER_SITE_OTHER): Payer: Medicare HMO | Admitting: Psychology

## 2020-05-18 ENCOUNTER — Encounter: Payer: Self-pay | Admitting: Psychology

## 2020-05-18 ENCOUNTER — Other Ambulatory Visit: Payer: Self-pay

## 2020-05-18 DIAGNOSIS — E1165 Type 2 diabetes mellitus with hyperglycemia: Secondary | ICD-10-CM

## 2020-05-18 DIAGNOSIS — IMO0002 Reserved for concepts with insufficient information to code with codable children: Secondary | ICD-10-CM

## 2020-05-18 DIAGNOSIS — F329 Major depressive disorder, single episode, unspecified: Secondary | ICD-10-CM | POA: Insufficient documentation

## 2020-05-18 DIAGNOSIS — G4733 Obstructive sleep apnea (adult) (pediatric): Secondary | ICD-10-CM | POA: Diagnosis not present

## 2020-05-18 DIAGNOSIS — G3184 Mild cognitive impairment, so stated: Secondary | ICD-10-CM

## 2020-05-18 DIAGNOSIS — I509 Heart failure, unspecified: Secondary | ICD-10-CM | POA: Insufficient documentation

## 2020-05-18 DIAGNOSIS — E1149 Type 2 diabetes mellitus with other diabetic neurological complication: Secondary | ICD-10-CM | POA: Diagnosis not present

## 2020-05-18 HISTORY — DX: Mild cognitive impairment of uncertain or unknown etiology: G31.84

## 2020-05-18 NOTE — Progress Notes (Signed)
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 Test: Raw Score (T Score) Percentile     Phonemic Fluency (FAS) 32 (43) 25 Average    Animal Fluency 18 (49) 46 Average        NAB Language Module, Form 1: T Score Percentile     Naming 30/31 (57) 75 Above Average       Visuospatial/Visuoconstruction:      Raw Score Percentile   Clock Drawing: 8/10 --- Within Normal Limits       Mood and Personality:      Raw Score Percentile   Geriatric Depression Scale: 8 --- Within Normal Limits  Geriatric Anxiety Scale: --- --- ---       Additional Questionnaires:      Raw Score Percentile   PROMIS Sleep Disturbance Questionnaire: 24 --- None to Slight   Informed Consent and Coding/Compliance:   The current evaluation represents a clinical evaluation for the purposes previously outlined by the referral source and is in no way reflective of a forensic evaluation.   Mr. Balis was provided with a verbal description of the nature and purpose of the present neuropsychological evaluation. Also reviewed were the foreseeable risks and/or discomforts and benefits of the procedure, limits of confidentiality, and mandatory reporting requirements of this provider. The patient was  given the  opportunity to ask questions and receive answers about the evaluation. Oral consent to participate was provided by the patient.   This evaluation was conducted by Christia Reading, Ph.D., licensed clinical neuropsychologist. Mr. Lortie completed a clinical interview with Dr. Melvyn Novas, billed as one unit [phone], and 120 minutes of cognitive testing and scoring, billed as one unit [phone] and three additional units [id number]. As a separate and discrete service, Dr. Melvyn Novas spent a total of 120 minutes in interpretation and report writing billed as one unit [phone] and one unit [phone].

## 2020-05-19 ENCOUNTER — Other Ambulatory Visit: Payer: Self-pay | Admitting: Internal Medicine

## 2020-05-21 ENCOUNTER — Encounter: Payer: Self-pay | Admitting: Psychology

## 2020-05-22 NOTE — Telephone Encounter (Signed)
Sent MyChart message to pt to find out if he requested this or did the pharmacy send it automatically. The medication was taken off of his med list stating it was an as needed rx and he was not using it anymore.

## 2020-05-23 ENCOUNTER — Telehealth: Payer: Self-pay | Admitting: Psychology

## 2020-05-23 ENCOUNTER — Ambulatory Visit (INDEPENDENT_AMBULATORY_CARE_PROVIDER_SITE_OTHER): Payer: Medicare HMO | Admitting: Psychology

## 2020-05-23 ENCOUNTER — Other Ambulatory Visit: Payer: Self-pay

## 2020-05-23 DIAGNOSIS — G3184 Mild cognitive impairment, so stated: Secondary | ICD-10-CM

## 2020-05-23 DIAGNOSIS — G4733 Obstructive sleep apnea (adult) (pediatric): Secondary | ICD-10-CM | POA: Diagnosis not present

## 2020-05-23 NOTE — Telephone Encounter (Signed)
Patient's wife called in and left a message stating she and the patient have some questions after getting home today and reviewing the results.

## 2020-05-23 NOTE — Progress Notes (Signed)
° °  Neuropsychology Feedback Session Tillie Rung. [facility]  Reason for Referral:   DAVONTAE PRUSINSKI a 70 y.o. right-handed Caucasian male referred by Ellouise Newer, M.D.,to characterize hiscurrent cognitive functioning and assist with diagnostic clarity and treatment planning in the context of subjective cognitive decline.   Feedback:   Mr. Preusser completed a comprehensive neuropsychological evaluation on 05/18/2020. Please refer to that encounter for the full report and recommendations. Briefly, results suggested mild deficits across encoding (i.e., learning) and retrieval aspects of memory, with the latter exhibiting a greater weakness. Some performance variability was further exhibited across response inhibition. Outside of these areas, all other cognitive performances were appropriate relative to age-matched peers and premorbid intellectual estimations. Overall, the most likely culprit for cognitive dysfunction may in fact be his combination of sleep dysfunction, Xanax side-effects, known medical conditions, and various psychosocial stressors. Continued medical monitoring will be important moving forward.   Mr. Dasch was accompanied by his wife during the current feedback session. Content of the current session focused on the results of his neuropsychological evaluation. Mr. Fuelling and his wife were given the opportunity to ask questions and their questions were answered. They were encouraged to reach out should additional questions arise. A copy of his report was provided at the conclusion of the visit.      18 minutes were spent conducting the current feedback session with Mr. Lacivita, billed as one unit [phone].

## 2020-05-23 NOTE — Patient Instructions (Signed)
A repeat neuropsychological evaluation in 24-36 months (or sooner if functional decline is noted) is recommended to assess the trajectory of future cognitive decline should it occur. This will also aid in future efforts towards improved diagnostic clarity.  Per medical records, Alejandro Koch has a history of obstructive sleep apnea and does not utilize a CPAP machine. If left untreated, sleep apnea will certainly influence cognitive abilities and could explain memory difficulties. If untreated, this condition will also place him at an increased risk for stroke, heart attack, and future cognitive decline. I would strongly recommend that he meet with his sleep specialists to determine possible avenues for ongoing treatment.  Xanax has well known and documented cognitive side effects and will commonly worsen brain fog. I would encourage him to discuss alternative medications for sleep initiation and anxiety with his prescribing physician.   Alejandro Koch is encouraged to attend to lifestyle factors for brain health (e.g., regular physical exercise, good nutrition habits, regular participation in cognitively-stimulating activities, and general stress management techniques), which are likely to have benefits for both emotional adjustment and cognition. In fact, in addition to promoting good general health, regular exercise incorporating aerobic activities (e.g., brisk walking, jogging, cycling, etc.) has been demonstrated to be a very effective treatment for depression and stress, with similar efficacy rates to both antidepressant medication and psychotherapy. Optimal control of vascular risk factors (including safe cardiovascular exercise and adherence to dietary recommendations) is encouraged.   If interested, there are some activities which have therapeutic value and can be useful in keeping him cognitively stimulated. For suggestions, Alejandro Koch is encouraged to go to the following website:  https://www.[facility].org/get-to-know-barrow/centers-programs/neurorehabilitation-center/neuro-rehab-apps-and-games/ which has options, categorized by level of difficulty. It should be noted that these activities should not be viewed as a substitute for therapy.  When learning new information, he would benefit from information being broken up into small, manageable pieces. He may also find it helpful to articulate the material in his own words and in a context to promote encoding at the onset of a new task. This material may need to be repeated multiple times to promote encoding.  Memory can be improved using internal strategies such as rehearsal, repetition, chunking, mnemonics, association, and imagery. External strategies such as written notes in a consistently used memory journal, visual and nonverbal auditory cues such as a calendar on the refrigerator or appointments with alarm, such as on a cell phone, can also help maximize recall.    To address problems with fluctuating attention, he may wish to consider:   -Avoiding external distractions when needing to concentrate   -Limiting exposure to fast paced environments with multiple sensory demands   -Writing down complicated information and using checklists   -Attempting and completing one task at a time (i.e., no multi-tasking)   -Verbalizing aloud each step of a task to maintain focus   -Taking frequent breaks during the completion of steps/tasks to avoid fatigue   -Reducing the amount of information considered at one time

## 2020-05-24 NOTE — Telephone Encounter (Signed)
I reached out and spoke to Alejandro Koch and his wife this morning to answer their questions.

## 2020-05-27 ENCOUNTER — Encounter (HOSPITAL_COMMUNITY): Payer: Self-pay | Admitting: Emergency Medicine

## 2020-05-27 ENCOUNTER — Ambulatory Visit (HOSPITAL_COMMUNITY)
Admission: EM | Admit: 2020-05-27 | Discharge: 2020-05-27 | Disposition: A | Discharge status: Home / Self Care | Payer: Medicare HMO | Attending: Emergency Medicine | Admitting: Emergency Medicine

## 2020-05-27 ENCOUNTER — Telehealth (HOSPITAL_COMMUNITY): Payer: Self-pay | Admitting: Emergency Medicine

## 2020-05-27 ENCOUNTER — Ambulatory Visit (INDEPENDENT_AMBULATORY_CARE_PROVIDER_SITE_OTHER): Payer: Medicare HMO

## 2020-05-27 ENCOUNTER — Other Ambulatory Visit: Payer: Self-pay

## 2020-05-27 DIAGNOSIS — R030 Elevated blood-pressure reading, without diagnosis of hypertension: Secondary | ICD-10-CM

## 2020-05-27 DIAGNOSIS — S99912A Unspecified injury of left ankle, initial encounter: Secondary | ICD-10-CM

## 2020-05-27 DIAGNOSIS — W19XXXA Unspecified fall, initial encounter: Secondary | ICD-10-CM

## 2020-05-27 DIAGNOSIS — S0990XA Unspecified injury of head, initial encounter: Secondary | ICD-10-CM

## 2020-05-27 DIAGNOSIS — M25572 Pain in left ankle and joints of left foot: Secondary | ICD-10-CM | POA: Diagnosis not present

## 2020-05-27 DIAGNOSIS — I10 Essential (primary) hypertension: Secondary | ICD-10-CM

## 2020-05-27 NOTE — ED Provider Notes (Signed)
[place]    CSN: [id number] Arrival date & time: 05/27/20  1603      History   Chief Complaint Chief Complaint  Patient presents with  . Fall  . Foot Pain    left    HPI Alejandro Koch is a 70 y.o. male.   Patient presents with pain in his left ankle after falling from a ladder yesterday.  He states he fell approximately 10 foot from a ladder, landed on his foot then fell to the side and struck his head.  He denies loss of consciousness.  He denies headache, vision changes, dizziness, weakness, numbness, paresthesias, chest pain, shortness of breath, abdominal pain, or other symptoms.  His medical history includes a mild neurocognitive disorder, diabetes, hypertension, CHF.  Patient adamantly refuses transfer to the ED.  The history is provided by the patient and medical records.    Past Medical History:  Diagnosis Date  . Abdominal discomfort, epigastric 06/10/2017  . Actinic keratoses 05/12/2014  . Acute chest pain 12/20/2007  . Acute prostatitis 06/10/2017   Classic presentation; will treat with sulfa x 2 weeks. He did well with this back in March  . Allergic rhinitis 09/30/2006  . Anemia   . Benign essential hypertension 09/30/2006   BP Readings from Last 3 Encounters: 06/16/17 138/72  06/09/17 118/66  12/09/16 136/82   Reasonable control  . Blood transfusion without reported diagnosis    40 yrs ago   . BPH with obstruction/lower urinary tract symptoms 12/27/2018   Still with mild symptoms. If worsens, will need to go back to the urologist  . CHF (congestive heart failure)   . Chronic cough 07/16/2018   Fits definition of chronic bronchitis except not 2 years in a row. Seeing the pulmonary doctor. Doesn't feel the breo has helped  . Extensor tendon laceration of right hand with open wound 09/16/2016  . Fatigue 06/10/2017  . Fatty liver 03/01/2010   LFTs normal. Still needs more weight loss  . GERD (gastroesophageal reflux disease) 09/30/2006   Tried  omeprazole and symptoms all came back. Will order the nexium again  . Glaucoma   . Hyperlipemia 09/30/2006   Inconsistent with the atorvastatin. Discussed---will take it twice a week  . Hyperlipidemia   . Hypogonadism in male 09/19/2009  . Kidney stones   . Laryngopharyngeal reflux (LPR) 06/03/2016  . Left foot pain 09/20/2019  . Major depressive disorder   . Mild neurocognitive disorder 05/18/2020  . OSA (obstructive sleep apnea) 09/30/2006   NPSG 07/2014:  AHI 23/hr, PLMS 452 with 12/hr with A/A NPSG 07/2014:  AHI 23/hr, PLMS 452 with 12/hr with A/A. The patient has moderate obstructive sleep apnea by his recent study, and is very symptomatic during the day. I have discussed with him the various treatment options, and have recommended a trial of C P  . Shortness of breath 05/21/2015   09/11/17 Right and Left Heart cath: Right radial and brachial access.Left dominant coronary system with no CAD RHC with some exercise showed RA 15, 14 mm Hg (mean 12)PA 37/14 mean 27,PWP 17,15 mean 16  . Type 2 diabetes mellitus with neurological manifestations, uncontrolled 03/05/2009   HGBA1C 8.7 (A) 03/30/2018. Not much better. I want to send him to endocrine--he wants to hold off (doesn't want insulin, etc) Needs formal help---like [employer] Will give him 3 months more  . Vitamin B12 deficiency 06/16/2017   Does take supplement daily. Will check with full labs at next visit  Patient Active Problem List   Diagnosis Date Noted  . Mild neurocognitive disorder 05/18/2020  . CHF (congestive heart failure)   . Major depressive disorder   . Left foot pain 09/20/2019  . Plantar wart of left foot 01/18/2019  . BPH with obstruction/lower urinary tract symptoms 12/27/2018  . Chronic cough 07/16/2018  . Vitamin B12 deficiency 06/16/2017  . Fatigue 06/10/2017  . Abdominal discomfort, epigastric 06/10/2017  . Laryngopharyngeal reflux (LPR) 06/03/2016  . Anemia 07/23/2015  . Shortness of breath 05/21/2015   . Actinic keratoses 05/12/2014  . Fatty liver 03/01/2010  . Hypogonadism in male 09/19/2009  . Type 2 diabetes mellitus with neurological manifestations, uncontrolled 03/05/2009  . Hyperlipemia 09/30/2006  . Benign essential hypertension 09/30/2006  . Allergic rhinitis 09/30/2006  . GERD (gastroesophageal reflux disease) 09/30/2006  . OSA (obstructive sleep apnea) 09/30/2006    Past Surgical History:  Procedure Laterality Date  . CARDIAC CATHETERIZATION     [facility] - myocard perf and echo done as well - all normal- EF 48-55% 2019 April and june 2019  . COLONOSCOPY  2008  . FINGER SURGERY  2013   infection 2nd finger left hand  . POLYPECTOMY    . REPAIR FLEXOR TENDON HAND Right 08/2016       Home Medications    Prior to Admission medications   Medication Sig Start Date End Date Taking? Authorizing Provider  ALPRAZolam Duanne Moron) 0.5 MG tablet TAKE 1 TABLET BY MOUTH THREE TIMES A DAY AS NEEDED 04/13/20  Yes Viviana Simpler I, MD  atorvastatin (LIPITOR) 20 MG tablet TAKE 1 TABLET (20 MG TOTAL) BY MOUTH DAILY. 07/23/17  Yes Venia Carbon, MD  Blood Glucose Monitoring Suppl (ONETOUCH VERIO REFLECT) w/Device KIT USE DAILY AS DIRECTED 01/18/20  Yes Venia Carbon, MD  BYDUREON BCISE 2 MG/0.85ML AUIJ INJECT 2 MG INTO THE SKIN ONCE A WEEK. 11/22/19  Yes Venia Carbon, MD  esomeprazole (NEXIUM) 20 MG capsule Take 40 mg by mouth 2 (two) times daily before a meal.   Yes [provider]  FARXIGA 5 MG TABS tablet TAKE 1 TABLET BY MOUTH EVERY DAY 01/04/20  Yes Viviana Simpler I, MD  finasteride (PROSCAR) 5 MG tablet TAKE 1 TABLET BY MOUTH EVERY DAY 11/22/19  Yes Viviana Simpler I, MD  glipiZIDE (GLUCOTROL XL) 10 MG 24 hr tablet TAKE 1 TABLET (10 MG TOTAL) BY MOUTH DAILY WITH BREAKFAST. 07/12/19  Yes Venia Carbon, MD  olmesartan-hydrochlorothiazide (BENICAR HCT) 20-12.5 MG tablet Take 1 tablet by mouth daily. 05/10/18  Yes Venia Carbon, MD  albuterol (PROVENTIL HFA;VENTOLIN HFA)  108 (90 Base) MCG/ACT inhaler Inhale into the lungs. 02/02/18 06/17/19  [provider]  hydrocortisone 2.5 % cream APPLY TOPICALLY 3 (THREE) TIMES DAILY AS NEEDED. 01/18/20   Venia Carbon, MD  hydrocortisone-pramoxine [facility]) 2.5-1 % rectal cream Place 1 application rectally 3 (three) times daily as needed. 07/30/12   Ricard Dillon, MD  ketorolac Nancie Neas) 0.5 % ophthalmic solution USE AS DIRECTED 06/27/13   Ricard Dillon, MD  metFORMIN (GLUCOPHAGE-XR) 500 MG 24 hr tablet TAKE 3 TABLETS BY MOUTH DAILY WITH BREAKFAST. 05/08/20   Venia Carbon, MD  Meth-Hyo-M Bl-Na Phos-Ph Sal (URO-MP) 118 MG CAPS Take 1 capsule by mouth every 6 (six) hours as needed. 05/09/16   [provider]  nitroGLYCERIN (NITROSTAT) 0.4 MG SL tablet Place under the tongue. 06/25/17 06/25/18  [provider]  Jonetta Speak LANCETS FINE MISC 1 Units by Does not apply route  daily. 07/26/15   Venia Carbon, MD  [facility] VERIO test strip USE 1 STRIP DAILY TO CHECK BLOOD SUGAR 10/14/19   Viviana Simpler I, MD  tamsulosin (FLOMAX) 0.4 MG CAPS capsule TAKE 1 CAPSULE (0.4 MG TOTAL) BY MOUTH DAILY AFTER SUPPER. 07/05/19   Viviana Simpler I, MD  urea (CARMOL) 40 % CREA APPLY PEA SIZED AMOUNT TO HARD AREA BELOW PINKY TOE TWICE A DAY 03/05/20   Venia Carbon, MD  vitamin B-12 (CYANOCOBALAMIN) 1000 MCG tablet Take 1 tablet (1,000 mcg total) by mouth daily. 06/10/17   Ria Bush, MD    Family History Family History  Problem Relation Age of Onset  . Alzheimer's disease Mother   . Heart disease Father   . COPD Father   . Colon cancer Father 75  . Prostate cancer Father   . Dementia Father   . Colon polyps Brother   . Alzheimer's disease Maternal Grandmother   . Leukemia Brother   . Leukemia Other   . Stomach cancer Neg Hx   . Esophageal cancer Neg Hx   . Rectal cancer Neg Hx     Social History Social History   Tobacco Use  . Smoking status: Former Smoker    Packs/day: 2.50     Years: 7.00    Pack years: 17.50    Types: Cigarettes    Quit date: 03/18/1983    Years since quitting: 37.2  . Smokeless tobacco: Never Used  Vaping Use  . Vaping Use: Never used  Substance Use Topics  . Alcohol use: Yes    Comment: occasional glass of wine  . Drug use: No     Allergies   Iohexol, Atorvastatin, and Metformin and related   Review of Systems Review of Systems  Constitutional: Negative for chills and fever.  HENT: Negative for ear pain and sore throat.   Eyes: Negative for pain and visual disturbance.  Respiratory: Negative for cough and shortness of breath.   Cardiovascular: Negative for chest pain and palpitations.  Gastrointestinal: Negative for abdominal pain and vomiting.  Genitourinary: Negative for dysuria and hematuria.  Musculoskeletal: Positive for arthralgias. Negative for back pain.  Skin: Negative for color change and rash.  Neurological: Negative for dizziness, syncope, weakness, numbness and headaches.  All other systems reviewed and are negative.    Physical Exam Triage Vital Signs ED Triage Vitals  Enc Vitals Group     BP      Pulse      Resp      Temp      Temp src      SpO2      Weight      Height      Head Circumference      Peak Flow      Pain Score      Pain Loc      Pain Edu?      Excl. in [place]?    No data found.  Updated Vital Signs BP (!) 167/99 (BP Location: Right Arm)   Pulse 80   Temp 97.8 F (36.6 C) (Oral)   Resp 20   SpO2 99%   Visual Acuity Right Eye Distance:   Left Eye Distance:   Bilateral Distance:    Right Eye Near:   Left Eye Near:    Bilateral Near:     Physical Exam Vitals and nursing note reviewed.  Constitutional:      General: He is not in acute distress.    Appearance: He  is well-developed. He is not ill-appearing.  HENT:     Head: Normocephalic and atraumatic.     Mouth/Throat:     Mouth: Mucous membranes are moist.  Eyes:     Extraocular Movements: Extraocular movements intact.      Conjunctiva/sclera: Conjunctivae normal.     Pupils: Pupils are equal, round, and reactive to light.  Cardiovascular:     Rate and Rhythm: Normal rate and regular rhythm.     Heart sounds: Normal heart sounds.  Pulmonary:     Effort: Pulmonary effort is normal. No respiratory distress.     Breath sounds: Normal breath sounds.  Abdominal:     Palpations: Abdomen is soft.     Tenderness: There is no abdominal tenderness. There is no guarding or rebound.  Musculoskeletal:        General: Swelling and tenderness present. No deformity. Normal range of motion.     Cervical back: Neck supple.       Feet:  Skin:    General: Skin is warm and dry.     Capillary Refill: Capillary refill takes less than 2 seconds.     Findings: No bruising, erythema, lesion or rash.  Neurological:     General: No focal deficit present.     Mental Status: He is alert and oriented to person, place, and time.     Sensory: No sensory deficit.     Motor: No weakness.     Gait: Gait abnormal.  Psychiatric:        Mood and Affect: Mood normal.        Behavior: Behavior normal.      UC Treatments / Results  Labs (all labs ordered are listed, but only abnormal results are displayed) Labs Reviewed - No data to display  EKG   Radiology DG Ankle Complete Left  Result Date: 05/27/2020 CLINICAL DATA:  Injured left ankle yesterday. EXAM: LEFT ANKLE COMPLETE - 3+ VIEW COMPARISON:  None. FINDINGS: The ankle mortise is maintained. No acute ankle fracture is identified. No ankle joint effusion. The subtalar joints are maintained. The mid and hindfoot bony structures are intact. IMPRESSION: No acute bony findings. Electronically Signed   By: Marijo Sanes M.D.   On: 05/27/2020 17:31    Procedures Procedures (including critical care time)  Medications Ordered in UC Medications - No data to display  Initial Impression / Assessment and Plan / UC Course  I have reviewed the triage vital signs and the nursing  notes.  Pertinent labs & imaging results that were available during my care of the patient were reviewed by me and considered in my medical decision making (see chart for details).    Fall, acute left ankle pain, head injury.  Elevated blood pressure reading with known hypertension.  Despite lengthy discussion about 10 foot fall and striking his head, patient adamantly refuses transfer to the ED.  He states he just wants to have his ankle checked.  Education provided on head injuries.  X-ray of ankle negative.  Discussed symptomatic treatment with rest, elevation, ice packs, Tylenol.  Education provided on ankle pain.  Instructed patient to follow-up with orthopedics if his symptoms are not improving.  Discussed with patient that his blood pressure is elevated today needs to be rechecked by his PCP in 2 to 4 weeks.  Education provided on managing hypertension.  Patient agrees to plan of care.   Final Clinical Impressions(s) / UC Diagnoses   Final diagnoses:  Fall, initial encounter  Acute left  ankle pain  Injury of head, initial encounter  Elevated blood pressure reading in office with diagnosis of hypertension     Discharge Instructions     Go to the emergency department if you have acute symptoms.  See the attached information on head injuries.  Rest and elevate your ankle.  Apply ice packs 2-3 times a day for up to 20 minutes each.    See the attached information on ankle pain.  Follow up with an orthopedist if your symptoms are not improving.   Your blood pressure is elevated today at 167/99.  Please have this rechecked by your primary care provider in 2-4 weeks.    See the attached information on managing your hypertension.         ED Prescriptions    None     PDMP not reviewed this encounter.   Sharion Balloon, NP 05/27/20 1745

## 2020-05-27 NOTE — Telephone Encounter (Deleted)
Pt expected to arrive with c/o of 10 foot fall from ladder yesterday landing on heel.

## 2020-05-27 NOTE — ED Triage Notes (Signed)
Pt presents today with c/o of pain to left heel after he feel from ladder yesterday. He reports he was putting up storage items into ceiling in garage and fell onto concrete. He reports falling approx 10 feet also striking head on concrete, denies LOC. +ambulatory.

## 2020-05-27 NOTE — Discharge Instructions (Addendum)
Go to the emergency department if you have acute symptoms.  See the attached information on head injuries.  Rest and elevate your ankle.  Apply ice packs 2-3 times a day for up to 20 minutes each.    See the attached information on ankle pain.  Follow up with an orthopedist if your symptoms are not improving.   Your blood pressure is elevated today at 167/99.  Please have this rechecked by your primary care provider in 2-4 weeks.    See the attached information on managing your hypertension.

## 2020-06-17 ENCOUNTER — Other Ambulatory Visit: Payer: Self-pay | Admitting: Internal Medicine

## 2020-06-18 NOTE — Telephone Encounter (Signed)
Alprazolam last filled 04-13-20 #90 Last OV 02-28-20 Next OV 08-29-20 [facility]

## 2020-06-25 ENCOUNTER — Other Ambulatory Visit: Payer: Self-pay | Admitting: Internal Medicine

## 2020-07-03 ENCOUNTER — Telehealth: Payer: Self-pay

## 2020-07-03 NOTE — Telephone Encounter (Signed)
Left message on VM per DPR and verified VM that technically he did not need another pneumonia vaccine although he got 23 before her was 87. It is something to discuss with Dr [name] at his next OV.

## 2020-07-03 NOTE — Telephone Encounter (Signed)
Pt left v/m wanting to know if he needed to get a pneumonia shot and if so should he get at [facility] or a pharmacy. Per immunization list pt had pneumovax on 10/05/2013 and prevnar 13 on 05/21/2015. Pt request cb.

## 2020-07-04 NOTE — Telephone Encounter (Signed)
I think it makes sense to update his 23-----to have one after age 70

## 2020-08-16 ENCOUNTER — Other Ambulatory Visit: Payer: Self-pay | Admitting: Internal Medicine

## 2020-08-17 NOTE — Telephone Encounter (Signed)
Alprazolam last filled 06-18-20 #90 Last OV 02-28-20 Next OV 08-29-20 [facility]

## 2020-08-29 ENCOUNTER — Ambulatory Visit (INDEPENDENT_AMBULATORY_CARE_PROVIDER_SITE_OTHER): Payer: Medicare HMO | Admitting: Internal Medicine

## 2020-08-29 ENCOUNTER — Other Ambulatory Visit: Payer: Self-pay

## 2020-08-29 ENCOUNTER — Encounter: Payer: Self-pay | Admitting: Internal Medicine

## 2020-08-29 VITALS — BP 112/60 | HR 81 | Temp 97.4°F | Ht 77.0 in | Wt 256.0 lb

## 2020-08-29 DIAGNOSIS — I10 Essential (primary) hypertension: Secondary | ICD-10-CM | POA: Diagnosis not present

## 2020-08-29 DIAGNOSIS — E1165 Type 2 diabetes mellitus with hyperglycemia: Secondary | ICD-10-CM | POA: Diagnosis not present

## 2020-08-29 DIAGNOSIS — IMO0002 Reserved for concepts with insufficient information to code with codable children: Secondary | ICD-10-CM

## 2020-08-29 DIAGNOSIS — E1149 Type 2 diabetes mellitus with other diabetic neurological complication: Secondary | ICD-10-CM

## 2020-08-29 DIAGNOSIS — G3184 Mild cognitive impairment, so stated: Secondary | ICD-10-CM | POA: Diagnosis not present

## 2020-08-29 LAB — COMPREHENSIVE METABOLIC PANEL
ALT: 21 U/L (ref 0–53)
AST: 18 U/L (ref 0–37)
Albumin: 4.2 g/dL (ref 3.5–5.2)
Alkaline Phosphatase: 66 U/L (ref 39–117)
BUN: 17 mg/dL (ref 6–23)
CO2: 28 mEq/L (ref 19–32)
Calcium: 9.2 mg/dL (ref 8.4–10.5)
Chloride: 102 mEq/L (ref 96–112)
Creatinine, Ser: 0.91 mg/dL (ref 0.40–1.50)
GFR: 85.51 mL/min (ref >60.00)
Glucose, Bld: 156 mg/dL — ABNORMAL HIGH (ref 70–99)
Potassium: 4.4 mEq/L (ref 3.5–5.1)
Sodium: 138 mEq/L (ref 135–145)
Total Bilirubin: 0.7 mg/dL (ref 0.2–1.2)
Total Protein: 6.6 g/dL (ref 6.0–8.3)

## 2020-08-29 LAB — CBC
HCT: 37.3 % — ABNORMAL LOW (ref 39.0–52.0)
Hemoglobin: 12.9 g/dL — ABNORMAL LOW (ref 13.0–17.0)
MCHC: 34.5 g/dL (ref 30.0–36.0)
MCV: 86.4 fl (ref 78.0–100.0)
Platelets: 223 10*3/uL (ref 150.0–400.0)
RBC: 4.31 Mil/uL (ref 4.22–5.81)
RDW: 13.9 % (ref 11.5–15.5)
WBC: 6.7 10*3/uL (ref 4.0–10.5)

## 2020-08-29 LAB — LIPID PANEL
Cholesterol: 241 mg/dL — ABNORMAL HIGH (ref 0–200)
HDL: 39.6 mg/dL (ref >39.00)
LDL Cholesterol: 165 mg/dL — ABNORMAL HIGH (ref 0–99)
NonHDL: 201.45
Total CHOL/HDL Ratio: 6
Triglycerides: 183 mg/dL — ABNORMAL HIGH (ref 0.0–149.0)
VLDL: 36.6 mg/dL (ref 0.0–40.0)

## 2020-08-29 LAB — POCT GLYCOSYLATED HEMOGLOBIN (HGB A1C): Hemoglobin A1C: 7.8 % — AB (ref 4.0–5.6)

## 2020-08-29 LAB — T4, FREE: Free T4: 0.91 ng/dL (ref 0.60–1.60)

## 2020-08-29 LAB — VITAMIN B12: Vitamin B-12: 552 pg/mL (ref 211–911)

## 2020-08-29 LAB — HM DIABETES FOOT EXAM

## 2020-08-29 MED ORDER — UREA 40 % EX CREA: TOPICAL_CREAM | CUTANEOUS | 1 refills | Status: DC

## 2020-08-29 NOTE — Assessment & Plan Note (Signed)
Lab Results  Component Value Date   HGBA1C 7.8 (A) 08/29/2020   Stable control on bydureon, glipizide, metformin, farxiga Neuropathy but not really painful

## 2020-08-29 NOTE — Progress Notes (Signed)
Subjective:    Patient ID: Alejandro Koch, male    DOB: [birth year], 70 y.o.   MRN: [id number]  HPI Here with wife for follow up of diabetes, cognitive issues, etc This visit occurred during the SARS-CoV-2 public health emergency.  Safety protocols were in place, including screening questions prior to the visit, additional usage of staff PPE, and extensive cleaning of exam room while observing appropriate contact time as indicated for disinfecting solutions.   Still has some cognitive concerns Wife notes worsening Will have follow up with Dr Delice Lesch  Ongoing dizziness---seems more frequent Now everyday Not in bed. Wife notes he does complain while sitting Feels off balance---but doesn't have much trouble walking (does have pain when bare footed) Feels better if he puts his hand down  BP fluctuates Drops down to 90/60 he thinks Has cut the benicar to 1/4 tab daily  Sugars 80-140 range No hypoglycemia  Current Outpatient Medications on File Prior to Visit  Medication Sig Dispense Refill   ALPRAZolam (XANAX) 0.5 MG tablet TAKE 1 TABLET BY MOUTH THREE TIMES A DAY AS NEEDED 90 tablet 0   atorvastatin (LIPITOR) 20 MG tablet TAKE 1 TABLET (20 MG TOTAL) BY MOUTH DAILY. 90 tablet 3   Blood Glucose Monitoring Suppl (ONETOUCH VERIO REFLECT) w/Device KIT USE DAILY AS DIRECTED 1 kit 0   BYDUREON BCISE 2 MG/0.85ML AUIJ INJECT 2 MG INTO THE SKIN ONCE A WEEK. 10.2 mL 11   esomeprazole (NEXIUM) 20 MG capsule Take 40 mg by mouth 2 (two) times daily before a meal.     FARXIGA 5 MG TABS tablet TAKE 1 TABLET BY MOUTH EVERY DAY 30 tablet 11   finasteride (PROSCAR) 5 MG tablet TAKE 1 TABLET BY MOUTH EVERY DAY 90 tablet 3   glipiZIDE (GLUCOTROL XL) 10 MG 24 hr tablet TAKE 1 TABLET (10 MG TOTAL) BY MOUTH DAILY WITH BREAKFAST. 90 tablet 3   hydrocortisone 2.5 % cream APPLY TOPICALLY 3 (THREE) TIMES DAILY AS NEEDED. 28 g 3   hydrocortisone-pramoxine (ANALPRAM-HC) 2.5-1 % rectal cream Place 1 application  rectally 3 (three) times daily as needed. 30 g 3   ketorolac (ACULAR) 0.5 % ophthalmic solution USE AS DIRECTED 5 mL 1   metFORMIN (GLUCOPHAGE-XR) 500 MG 24 hr tablet TAKE 3 TABLETS BY MOUTH DAILY WITH BREAKFAST. 270 tablet 3   Meth-Hyo-M Bl-Na Phos-Ph Sal (URO-MP) 118 MG CAPS Take 1 capsule by mouth every 6 (six) hours as needed.     olmesartan-hydrochlorothiazide (BENICAR HCT) 20-12.5 MG tablet Take 1 tablet by mouth daily. 30 tablet 11   ONETOUCH DELICA LANCETS FINE MISC 1 Units by Does not apply route daily. 100 each 2   ONETOUCH VERIO test strip USE 1 STRIP DAILY TO CHECK BLOOD SUGAR 50 strip 11   tamsulosin (FLOMAX) 0.4 MG CAPS capsule TAKE 1 CAPSULE (0.4 MG TOTAL) BY MOUTH DAILY AFTER SUPPER. 90 capsule 3   urea (CARMOL) 40 % CREA APPLY PEA SIZED AMOUNT TO HARD AREA BELOW PINKY TOE TWICE A DAY 28.35 g 1   vitamin B-12 (CYANOCOBALAMIN) 1000 MCG tablet Take 1 tablet (1,000 mcg total) by mouth daily.     albuterol (PROVENTIL HFA;VENTOLIN HFA) 108 (90 Base) MCG/ACT inhaler Inhale into the lungs.     nitroGLYCERIN (NITROSTAT) 0.4 MG SL tablet Place under the tongue.     No current facility-administered medications on file prior to visit.    Allergies  Allergen Reactions   Iohexol      Code: HIVES, Desc: PT  IS ALLERGIC TO IVP DYE AS NOTED IN PREVIOUS RECORDS IN PACS 02/2009/RM, Onset Date: [id number]    Atorvastatin Other (See Comments)    Myalgias with this and rosuvastin   Metformin And Related Diarrhea    Past Medical History:  Diagnosis Date   Abdominal discomfort, epigastric 06/10/2017   Actinic keratoses 05/12/2014   Acute chest pain 12/20/2007   Acute prostatitis 06/10/2017   Classic presentation; will treat with sulfa x 2 weeks. He did well with this back in March   Allergic rhinitis 09/30/2006   Anemia    Benign essential hypertension 09/30/2006   BP Readings from Last 3 Encounters: 06/16/17 138/72  06/09/17 118/66  12/09/16 136/82   Reasonable control   Blood transfusion  without reported diagnosis    40 yrs ago    BPH with obstruction/lower urinary tract symptoms 12/27/2018   Still with mild symptoms. If worsens, will need to go back to the urologist   CHF (congestive heart failure)    Chronic cough 07/16/2018   Fits definition of chronic bronchitis except not 2 years in a row. Seeing the pulmonary doctor. Doesn't feel the breo has helped   Extensor tendon laceration of right hand with open wound 09/16/2016   Fatigue 06/10/2017   Fatty liver 03/01/2010   LFTs normal. Still needs more weight loss   GERD (gastroesophageal reflux disease) 09/30/2006   Tried omeprazole and symptoms all came back. Will order the nexium again   Glaucoma    Hyperlipemia 09/30/2006   Inconsistent with the atorvastatin. Discussed---will take it twice a week   Hyperlipidemia    Hypogonadism in male 09/19/2009   Kidney stones    Laryngopharyngeal reflux (LPR) 06/03/2016   Left foot pain 09/20/2019   Major depressive disorder    Mild neurocognitive disorder 05/18/2020   OSA (obstructive sleep apnea) 09/30/2006   NPSG 07/2014:  AHI 23/hr, PLMS 452 with 12/hr with A/A NPSG 07/2014:  AHI 23/hr, PLMS 452 with 12/hr with A/A. The patient has moderate obstructive sleep apnea by his recent study, and is very symptomatic during the day. I have discussed with him the various treatment options, and have recommended a trial of C P   Shortness of breath 05/21/2015   09/11/17 Right and Left Heart cath: Right radial and brachial access.Left dominant coronary system with no CAD RHC with some exercise showed RA 15, 14 mm Hg (mean 12)PA 37/14  mean 27,PWP 17,15 mean 16   Type 2 diabetes mellitus with neurological manifestations, uncontrolled 03/05/2009   HGBA1C 8.7 (A) 03/30/2018. Not much better. I want to send him to endocrine--he wants to hold off (doesn't want insulin, etc) Needs formal help---like Weight [employer] Will give him 3 months more   Vitamin B12 deficiency 06/16/2017   Does take supplement  daily. Will check with full labs at next visit    Past Surgical History:  Procedure Laterality Date   CARDIAC CATHETERIZATION     [facility] - myocard perf and echo done as well - all normal- EF 48-55% 2019 April and june 2019   COLONOSCOPY  2008   FINGER SURGERY  2013   infection 2nd finger left hand   POLYPECTOMY     REPAIR FLEXOR TENDON HAND Right 08/2016    Family History  Problem Relation Age of Onset   Alzheimer's disease Mother    Heart disease Father    COPD Father    Colon cancer Father 21   Prostate cancer Father    Dementia Father  Colon polyps Brother    Alzheimer's disease Maternal Grandmother    Leukemia Brother    Leukemia Other    Stomach cancer Neg Hx    Esophageal cancer Neg Hx    Rectal cancer Neg Hx     Social History   Socioeconomic History   Marital status: Married    Spouse name: Not on file   Number of children: 1   Years of education: 14   Highest education level: Associate degree: academic program  Occupational History   Occupation: Retired    Comment: EMS, Customer service manager  Tobacco Use   Smoking status: Former    Packs/day: 2.50    Years: 7.00    Pack years: 17.50    Types: Cigarettes    Quit date: 03/18/1983    Years since quitting: 37.4   Smokeless tobacco: Never  Vaping Use   Vaping Use: Never used  Substance and Sexual Activity   Alcohol use: Yes    Comment: occasional glass of wine   Drug use: No   Sexual activity: Not on file  Other Topics Concern   Not on file  Social History Narrative   No living will or health care POA thus far   Wife, then son, should make decisions. May make son the primary person   Would accept resuscitation attempts but wouldn't want to be on machines   No tube feeds if cognitively unaware   Social Determinants of Health   Financial Resource Strain: Not on file  Food Insecurity: Not on file  Transportation Needs: Not on file  Physical Activity: Not on file  Stress: Not on file  Social Connections:  Not on file  Intimate Partner Violence: Not on file   Review of Systems Appetite is down now Has lost 50# over the past couple of years    Objective:   Physical Exam Constitutional:      Appearance: Normal appearance.  Cardiovascular:     Rate and Rhythm: Normal rate and regular rhythm.     Pulses: Normal pulses.     Heart sounds: No murmur heard.   No gallop.  Pulmonary:     Effort: Pulmonary effort is normal.     Breath sounds: Normal breath sounds. No wheezing or rales.  Musculoskeletal:     Cervical back: Neck supple.     Right lower leg: No edema.     Left lower leg: No edema.  Lymphadenopathy:     Cervical: No cervical adenopathy.  Skin:    General: Skin is warm.     Findings: No rash.     Comments:  No foot lesions  Neurological:     Mental Status: He is alert.     Comments: Decreased sensation in feet           Assessment & Plan:

## 2020-08-29 NOTE — Assessment & Plan Note (Signed)
Seems to be slowly progressing Will be going back to neuro in the fall

## 2020-08-29 NOTE — Assessment & Plan Note (Signed)
BP Readings from Last 3 Encounters:  08/29/20 112/60  05/27/20 (!) 167/99  05/15/20 108/71   He has cut down the benicare/HCTZ due to low pressures Not sure if there is a BP element to his dizziness (seems mostly neurologic)---will change to just low dose olmesartan if stays low

## 2020-08-29 NOTE — Addendum Note (Signed)
Addended by: Pilar Grammes on: 08/29/2020 10:55 AM   Modules accepted: Orders

## 2020-09-03 DIAGNOSIS — I25119 Atherosclerotic heart disease of native coronary artery with unspecified angina pectoris: Secondary | ICD-10-CM | POA: Diagnosis not present

## 2020-09-03 DIAGNOSIS — E785 Hyperlipidemia, unspecified: Secondary | ICD-10-CM | POA: Diagnosis not present

## 2020-09-03 DIAGNOSIS — E1165 Type 2 diabetes mellitus with hyperglycemia: Secondary | ICD-10-CM | POA: Diagnosis not present

## 2020-09-03 DIAGNOSIS — K219 Gastro-esophageal reflux disease without esophagitis: Secondary | ICD-10-CM | POA: Diagnosis not present

## 2020-09-03 DIAGNOSIS — I499 Cardiac arrhythmia, unspecified: Secondary | ICD-10-CM | POA: Diagnosis not present

## 2020-09-03 DIAGNOSIS — R69 Illness, unspecified: Secondary | ICD-10-CM | POA: Diagnosis not present

## 2020-09-03 DIAGNOSIS — E1142 Type 2 diabetes mellitus with diabetic polyneuropathy: Secondary | ICD-10-CM | POA: Diagnosis not present

## 2020-09-03 DIAGNOSIS — E11620 Type 2 diabetes mellitus with diabetic dermatitis: Secondary | ICD-10-CM | POA: Diagnosis not present

## 2020-09-03 DIAGNOSIS — I10 Essential (primary) hypertension: Secondary | ICD-10-CM | POA: Diagnosis not present

## 2020-09-03 DIAGNOSIS — H04129 Dry eye syndrome of unspecified lacrimal gland: Secondary | ICD-10-CM | POA: Diagnosis not present

## 2020-09-03 DIAGNOSIS — Z008 Encounter for other general examination: Secondary | ICD-10-CM | POA: Diagnosis not present

## 2020-09-03 LAB — HEMOGLOBIN A1C: Hemoglobin A1C: 7.6

## 2020-09-03 LAB — HM DIABETES EYE EXAM

## 2020-09-20 ENCOUNTER — Other Ambulatory Visit: Payer: Self-pay | Admitting: Internal Medicine

## 2020-09-21 NOTE — Telephone Encounter (Signed)
Last filled 08-17-20 #90 Last OV 08-29-20 Next OV 03-01-21 [facility]

## 2020-10-16 DIAGNOSIS — R0602 Shortness of breath: Secondary | ICD-10-CM | POA: Diagnosis not present

## 2020-11-28 ENCOUNTER — Other Ambulatory Visit: Payer: Self-pay | Admitting: Internal Medicine

## 2020-12-02 ENCOUNTER — Other Ambulatory Visit: Payer: Self-pay | Admitting: Internal Medicine

## 2020-12-02 NOTE — Telephone Encounter (Signed)
Last filled 09-21-20 #90 Last OV 08-29-20 Next OV 03-01-21 [facility]

## 2020-12-10 ENCOUNTER — Other Ambulatory Visit: Payer: Self-pay | Admitting: Internal Medicine

## 2020-12-10 ENCOUNTER — Telehealth: Payer: Self-pay | Admitting: Internal Medicine

## 2020-12-10 NOTE — Chronic Care Management (AMB) (Signed)
  Chronic Care Management   Outreach Note  12/10/2020 Name: Alejandro Koch MRN: [id number] DOB: Jul 03, 1950  Referred by: Venia Carbon, MD Reason for referral : No chief complaint on file.   An unsuccessful telephone outreach was attempted today. The patient was referred to the pharmacist for assistance with care management and care coordination.   Follow Up Plan:   Tatjana Dellinger Upstream Scheduler

## 2020-12-17 ENCOUNTER — Telehealth: Payer: Self-pay | Admitting: Internal Medicine

## 2020-12-17 NOTE — Progress Notes (Signed)
  Chronic Care Management   Outreach Note  12/17/2020 Name: Alejandro Koch MRN: [id number] DOB: 05-27-1950  Referred by: Venia Carbon, MD Reason for referral : No chief complaint on file.   A second unsuccessful telephone outreach was attempted today. The patient was referred to pharmacist for assistance with care management and care coordination.  Follow Up Plan:   Tatjana Dellinger Upstream Scheduler

## 2020-12-24 ENCOUNTER — Telehealth: Payer: Self-pay | Admitting: Internal Medicine

## 2020-12-24 NOTE — Chronic Care Management (AMB) (Signed)
  Chronic Care Management   Outreach Note  12/24/2020 Name: Alejandro Koch MRN: [id number] DOB: 06-Nov-1950  Referred by: Venia Carbon, MD Reason for referral : No chief complaint on file.   Third unsuccessful telephone outreach was attempted today. The patient was referred to the pharmacist for assistance with care management and care coordination.   Follow Up Plan:   Tatjana Dellinger Upstream Scheduler

## 2021-01-02 ENCOUNTER — Other Ambulatory Visit: Payer: Self-pay

## 2021-01-02 ENCOUNTER — Encounter: Payer: Self-pay | Admitting: Neurology

## 2021-01-02 ENCOUNTER — Ambulatory Visit: Payer: Medicare HMO | Admitting: Neurology

## 2021-01-02 VITALS — BP 131/77 | HR 80 | Ht 77.0 in | Wt 247.8 lb

## 2021-01-02 DIAGNOSIS — G3184 Mild cognitive impairment, so stated: Secondary | ICD-10-CM | POA: Diagnosis not present

## 2021-01-02 DIAGNOSIS — G4733 Obstructive sleep apnea (adult) (pediatric): Secondary | ICD-10-CM | POA: Diagnosis not present

## 2021-01-02 DIAGNOSIS — R42 Dizziness and giddiness: Secondary | ICD-10-CM | POA: Diagnosis not present

## 2021-01-02 NOTE — Patient Instructions (Signed)
Schedule home sleep study  2. Referral will be sent for Vestibular therapy for the dizziness  3. Discuss starting a depression medication and weight loss with PCP  4. Follow-up in 6 months, call for any changes    RECOMMENDATIONS FOR ALL PATIENTS WITH MEMORY PROBLEMS: 1. Continue to exercise (Recommend 30 minutes of walking everyday, or 3 hours every week) 2. Increase social interactions - continue going to [place] and enjoy social gatherings with friends and family 3. Eat healthy, avoid fried foods and eat more fruits and vegetables 4. Maintain adequate blood pressure, blood sugar, and blood cholesterol level. Reducing the risk of stroke and cardiovascular disease also helps promoting better memory. 5. Avoid stressful situations. Live a simple life and avoid aggravations. Organize your time and prepare for the next day in anticipation. 6. Sleep well, avoid any interruptions of sleep and avoid any distractions in the bedroom that may interfere with adequate sleep quality 7. Avoid sugar, avoid sweets as there is a strong link between excessive sugar intake, diabetes, and cognitive impairment We discussed the Mediterranean diet, which has been shown to help patients reduce the risk of progressive memory disorders and reduces cardiovascular risk. This includes eating fish, eat fruits and green leafy vegetables, nuts like almonds and hazelnuts, walnuts, and also use olive oil. Avoid fast foods and fried foods as much as possible. Avoid sweets and sugar as sugar use has been linked to worsening of memory function.      Mediterranean Diet  Why follow it? Research shows. Those who follow the Mediterranean diet have a reduced risk of heart disease  The diet is associated with a reduced incidence of Parkinson's and Alzheimer's diseases People following the diet may have longer life expectancies and lower rates of chronic diseases  The Dietary Guidelines for Americans recommends the Mediterranean diet  as an eating plan to promote health and prevent disease  What Is the Mediterranean Diet?  Healthy eating plan based on typical foods and recipes of Mediterranean-style cooking The diet is primarily a plant based diet; these foods should make up a majority of meals   Starches - Plant based foods should make up a majority of meals - They are an important sources of vitamins, minerals, energy, antioxidants, and fiber - Choose whole grains, foods high in fiber and minimally processed items  - Typical grain sources include wheat, oats, barley, corn, brown rice, bulgar, farro, millet, polenta, couscous  - Various types of beans include chickpeas, lentils, fava beans, black beans, white beans   Fruits  Veggies - Large quantities of antioxidant rich fruits & veggies; 6 or more servings  - Vegetables can be eaten raw or lightly drizzled with oil and cooked  - Vegetables common to the traditional Mediterranean Diet include: artichokes, arugula, beets, broccoli, brussel sprouts, cabbage, carrots, celery, collard greens, cucumbers, eggplant, kale, leeks, lemons, lettuce, mushrooms, okra, onions, peas, peppers, potatoes, pumpkin, radishes, rutabaga, shallots, spinach, sweet potatoes, turnips, zucchini - Fruits common to the Mediterranean Diet include: apples, apricots, avocados, cherries, clementines, dates, figs, grapefruits, grapes, melons, nectarines, oranges, peaches, pears, pomegranates, strawberries, tangerines  Fats - Replace butter and margarine with healthy oils, such as olive oil, canola oil, and tahini  - Limit nuts to no more than a handful a day  - Nuts include walnuts, almonds, pecans, pistachios, pine nuts  - Limit or avoid candied, honey roasted or heavily salted nuts - Olives are central to the Mediterranean diet - can be eaten whole or used in a variety  of dishes   Meats Protein - Limiting red meat: no more than a few times a month - When eating red meat: choose lean cuts and keep the  portion to the size of deck of cards - Eggs: approx. 0 to 4 times a week  - Fish and lean poultry: at least 2 a week  - Healthy protein sources include, chicken, [place], lean beef, lamb - Increase intake of seafood such as tuna, salmon, trout, mackerel, shrimp, scallops - Avoid or limit high fat processed meats such as sausage and bacon  Dairy - Include moderate amounts of low fat dairy products  - Focus on healthy dairy such as fat free yogurt, skim milk, low or reduced fat cheese - Limit dairy products higher in fat such as whole or 2% milk, cheese, ice cream  Alcohol - Moderate amounts of red wine is ok  - No more than 5 oz daily for women (all ages) and men older than age 93  - No more than 10 oz of wine daily for men younger than 66  Other - Limit sweets and other desserts  - Use herbs and spices instead of salt to flavor foods  - Herbs and spices common to the traditional Mediterranean Diet include: basil, bay leaves, chives, cloves, cumin, fennel, garlic, lavender, marjoram, mint, oregano, parsley, pepper, rosemary, sage, savory, sumac, tarragon, thyme   It's not just a diet, it's a lifestyle:  The Mediterranean diet includes lifestyle factors typical of those in the region  Foods, drinks and meals are best eaten with others and savored Daily physical activity is important for overall good health This could be strenuous exercise like running and aerobics This could also be more leisurely activities such as walking, housework, yard-work, or taking the stairs Moderation is the key; a balanced and healthy diet accommodates most foods and drinks Consider portion sizes and frequency of consumption of certain foods   Meal Ideas & Options:  Breakfast:  Whole wheat toast or whole wheat English muffins with peanut butter & hard boiled egg Steel cut oats topped with apples & cinnamon and skim milk  Fresh fruit: banana, strawberries, melon, berries, peaches  Smoothies: strawberries,  bananas, greek yogurt, peanut butter Low fat greek yogurt with blueberries and granola  Egg white omelet with spinach and mushrooms Breakfast couscous: whole wheat couscous, apricots, skim milk, cranberries  Sandwiches:  Hummus and grilled vegetables (peppers, zucchini, squash) on whole wheat bread   Grilled chicken on whole wheat pita with lettuce, tomatoes, cucumbers or tzatziki  [place] salad on whole wheat bread: tuna salad made with greek yogurt, olives, red peppers, capers, green onions Garlic rosemary lamb pita: lamb sauted with garlic, rosemary, salt & pepper; add lettuce, cucumber, greek yogurt to pita - flavor with lemon juice and black pepper  Seafood:  Mediterranean grilled salmon, seasoned with garlic, basil, parsley, lemon juice and black pepper Shrimp, lemon, and spinach whole-grain pasta salad made with low fat greek yogurt  Seared scallops with lemon orzo  Seared tuna steaks seasoned salt, pepper, coriander topped with tomato mixture of olives, tomatoes, olive oil, minced garlic, parsley, green onions and cappers  Meats:  Herbed greek chicken salad with kalamata olives, cucumber, feta  Red bell peppers stuffed with spinach, bulgur, lean ground beef (or lentils) & topped with feta   Kebabs: skewers of chicken, tomatoes, onions, zucchini, squash  [place] burgers: made with red onions, mint, dill, lemon juice, feta cheese topped with roasted red peppers Vegetarian Cucumber salad: cucumbers, artichoke  hearts, celery, red onion, feta cheese, tossed in olive oil & lemon juice  Hummus and whole grain pita points with a greek salad (lettuce, tomato, feta, olives, cucumbers, red onion) Lentil soup with celery, carrots made with vegetable broth, garlic, salt and pepper  Tabouli salad: parsley, bulgur, mint, scallions, cucumbers, tomato, radishes, lemon juice, olive oil, salt and pepper.

## 2021-01-02 NOTE — Progress Notes (Signed)
NEUROLOGY FOLLOW UP OFFICE NOTE  Alejandro Koch [id number] December 18, 1950  HISTORY OF PRESENT ILLNESS: I had the pleasure of seeing Alejandro Koch in follow-up in the neurology clinic on 01/02/2021.  The patient was last seen 7 months ago for memory loss. He is again accompanied by his wife who helps supplement the history today.  Records and images were personally reviewed where available.  Since his last visit, he underwent Neuropsychological testing in 05/2020 with a diagnosis of Mild Neurocognitive Disorder, etiology unclear. Findings were not consistent with Alzheimer's disease, most likely culprit felt to be a combination of sleep dysfunction, Xanax side effects, and cardiovascular disease, various psychosocial stressors.  They feel his memory has worsened, he has more trouble with recall and remembering things. He cannot focus on what he needs to do. He has trouble with organization and his thoughts are not clear. 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 He denies getting lost driving and uses his GPS. His wife helps fill his pillbox now because he was forgetting his medications. His wife manages finances. He used to be good with numbers, helping investors, but now he cannot do this anymore. Sleep had gotten better for a time, but is getting worse again, with 3-4 hours of sleep. He has been losing weight. He does not have an appetite. When asked about mood, he reports that he feels like he is under more stress, "a burden on me," and cannot get organized. He has occasional headaches. He gets dizzy a lot, more when standing. He describes a spinning sensation where he has to grab on to something, no nausea/vomiting, no falls.   History on Initial Assessment 05/15/2020: This is a 70 year old right-handed man with a history of hypertension, hyperlipidemia, OSA, diabetes, anxiety, depression, presenting for evaluation of memory loss. He feels  hie memory is not like it's supposed to be, he used to be pretty sharp and great with numbers. He lives with his wife. His wife started noticing changes about a year ago, he started having mood changes, a lot of edgy when he is usually even-keel. He was losing things frequently and appetite was not like it was. He was losing weight and reporting dizziness. He would repeat himself and get frustrated easily. She noticed his attention span would not be longer than 10-15 minutes, he would get easily distracted. 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 He has occasional word-finding difficulties when he used to be pretty accurate. He denies getting lost driving but has trouble remembering directions to the beach and questions himself more now. He denies missing medications or bill payments. His mother had Alzheimer's disease, his father had memory issues. He rarely drinks alcohol. He had a fall last Christmas where he lost consciousness. No neurosurgical procedures. He thinks his mood is better, he was in a very stressful work environment but did notice he used to be more tolerant but is now more irritable. His wife denies any paranoia or hallucinations.  He states he never had headaches in the past, but over the last 3 months he has had a low grade frontal headache that would occur infrequently. No associated nausea/vomiting. He has dizziness when sitting but more when standing. He describes a spinning sensation where he has to hold on for a few seconds to get his bearings, no falls. He denies any diplopia, dysarthria/dysphagia, neck/back pain,  focal numbness/tingling/weakness, bowel/bladder dysfunction, anosmia, or tremors. Sleep is good with Xanax every night.   Diagnostic Data: MRI brain without contrast done 02/2020 did not show any acute changes, there was mild diffuse atrophy.  Neuropsychological testing in 05/2020: diagnosis of Mild Neurocognitive Disorder,  etiology unclear. Findings were not consistent with Alzheimer's disease, most likely culprit felt to be a combination of sleep dysfunction, Xanax side effects, and cardiovascular disease, various psychosocial stressors.  Laboratory Data: Lab Results  Component Value Date   TSH 2.24 06/09/2017   Lab Results  Component Value Date   [id number] 295 08/29/2020   PAST MEDICAL HISTORY: Past Medical History:  Diagnosis Date   Abdominal discomfort, epigastric 06/10/2017   Actinic keratoses 05/12/2014   Acute chest pain 12/20/2007   Acute prostatitis 06/10/2017   Classic presentation; will treat with sulfa x 2 weeks. He did well with this back in March   Allergic rhinitis 09/30/2006   Anemia    Benign essential hypertension 09/30/2006   BP Readings from Last 3 Encounters: 06/16/17 138/72  06/09/17 118/66  12/09/16 136/82   Reasonable control   Blood transfusion without reported diagnosis    40 yrs ago    BPH with obstruction/lower urinary tract symptoms 12/27/2018   Still with mild symptoms. If worsens, will need to go back to the urologist   CHF (congestive heart failure)    Chronic cough 07/16/2018   Fits definition of chronic bronchitis except not 2 years in a row. Seeing the pulmonary doctor. Doesn't feel the breo has helped   Extensor tendon laceration of right hand with open wound 09/16/2016   Fatigue 06/10/2017   Fatty liver 03/01/2010   LFTs normal. Still needs more weight loss   GERD (gastroesophageal reflux disease) 09/30/2006   Tried omeprazole and symptoms all came back. Will order the nexium again   Glaucoma    Hyperlipemia 09/30/2006   Inconsistent with the atorvastatin. Discussed---will take it twice a week   Hyperlipidemia    Hypogonadism in male 09/19/2009   Kidney stones    Laryngopharyngeal reflux (LPR) 06/03/2016   Left foot pain 09/20/2019   Major depressive disorder    Mild neurocognitive disorder 05/18/2020   OSA (obstructive sleep apnea) 09/30/2006   NPSG  07/2014:  AHI 23/hr, PLMS 452 with 12/hr with A/A NPSG 07/2014:  AHI 23/hr, PLMS 452 with 12/hr with A/A. The patient has moderate obstructive sleep apnea by his recent study, and is very symptomatic during the day. I have discussed with him the various treatment options, and have recommended a trial of C P   Shortness of breath 05/21/2015   09/11/17 Right and Left Heart cath: Right radial and brachial access.Left dominant coronary system with no CAD RHC with some exercise showed RA 15, 14 mm Hg (mean 12)PA 37/14  mean 27,PWP 17,15 mean 16   Type 2 diabetes mellitus with neurological manifestations, uncontrolled 03/05/2009   HGBA1C 8.7 (A) 03/30/2018. Not much better. I want to send him to endocrine--he wants to hold off (doesn't want insulin, etc) Needs formal help---like Weight [employer] Will give him 3 months more   Vitamin B12 deficiency 06/16/2017   Does take supplement daily. Will check with full labs at next visit    MEDICATIONS: Current Outpatient Medications on File Prior to Visit  Medication Sig Dispense Refill   albuterol (PROVENTIL HFA;VENTOLIN HFA) 108 (90 Base) MCG/ACT inhaler Inhale into the lungs.     ALPRAZolam (XANAX) 0.5 MG tablet TAKE 1 TABLET BY MOUTH THREE TIMES A  DAY AS NEEDED 90 tablet 0   atorvastatin (LIPITOR) 20 MG tablet TAKE 1 TABLET (20 MG TOTAL) BY MOUTH DAILY. 90 tablet 3   Blood Glucose Monitoring Suppl (ONETOUCH VERIO REFLECT) w/Device KIT USE DAILY AS DIRECTED 1 kit 0   esomeprazole (NEXIUM) 20 MG capsule Take 40 mg by mouth 2 (two) times daily before a meal.     Exenatide ER (BYDUREON BCISE) 2 MG/0.85ML AUIJ INJECT 2 MG INTO THE SKIN ONCE A WEEK. 10.2 mL 11   FARXIGA 5 MG TABS tablet TAKE 1 TABLET BY MOUTH EVERY DAY 30 tablet 11   finasteride (PROSCAR) 5 MG tablet TAKE 1 TABLET BY MOUTH EVERY DAY 90 tablet 3   glipiZIDE (GLUCOTROL XL) 10 MG 24 hr tablet TAKE 1 TABLET (10 MG TOTAL) BY MOUTH DAILY WITH BREAKFAST. 90 tablet 3   glucose blood (ONETOUCH VERIO) test  strip Use 1 strip daily to check blood sugar. Dx Code E11.49 50 strip 11   hydrocortisone 2.5 % cream APPLY TOPICALLY 3 (THREE) TIMES DAILY AS NEEDED. 28 g 3   hydrocortisone-pramoxine (ANALPRAM-HC) 2.5-1 % rectal cream Place 1 application rectally 3 (three) times daily as needed. 30 g 3   ketorolac (ACULAR) 0.5 % ophthalmic solution USE AS DIRECTED 5 mL 1   metFORMIN (GLUCOPHAGE-XR) 500 MG 24 hr tablet TAKE 3 TABLETS BY MOUTH DAILY WITH BREAKFAST. 270 tablet 3   Meth-Hyo-M Bl-Na Phos-Ph Sal (URO-MP) 118 MG CAPS Take 1 capsule by mouth every 6 (six) hours as needed.     nitroGLYCERIN (NITROSTAT) 0.4 MG SL tablet Place under the tongue.     olmesartan-hydrochlorothiazide (BENICAR HCT) 20-12.5 MG tablet Take 1 tablet by mouth daily. 30 tablet 11   ONETOUCH DELICA LANCETS FINE MISC 1 Units by Does not apply route daily. 100 each 2   tamsulosin (FLOMAX) 0.4 MG CAPS capsule TAKE 1 CAPSULE (0.4 MG TOTAL) BY MOUTH DAILY AFTER SUPPER. 90 capsule 3   urea (CARMOL) 40 % CREA APPLY PEA SIZED AMOUNT TO HARD AREA BELOW PINKY TOE TWICE A DAY 28.35 g 1   vitamin B-12 (CYANOCOBALAMIN) 1000 MCG tablet Take 1 tablet (1,000 mcg total) by mouth daily.     No current facility-administered medications on file prior to visit.    ALLERGIES: Allergies  Allergen Reactions   Iohexol      Code: HIVES, Desc: PT IS ALLERGIC TO IVP DYE AS NOTED IN PREVIOUS RECORDS IN PACS 02/2009/RM, Onset Date: [id number]    Atorvastatin Other (See Comments)    Myalgias with this and rosuvastin   Metformin And Related Diarrhea    FAMILY HISTORY: Family History  Problem Relation Age of Onset   Alzheimer's disease Mother    Heart disease Father    COPD Father    Colon cancer Father 67   Prostate cancer Father    Dementia Father    Colon polyps Brother    Alzheimer's disease Maternal Grandmother    Leukemia Brother    Leukemia Other    Stomach cancer Neg Hx    Esophageal cancer Neg Hx    Rectal cancer Neg Hx     SOCIAL  HISTORY: Social History   Socioeconomic History   Marital status: Married    Spouse name: Not on file   Number of children: 1   Years of education: 14   Highest education level: Associate degree: academic program  Occupational History   Occupation: Retired    Comment: EMS, Customer service manager  Tobacco Use   Smoking status:  Former    Packs/day: 2.50    Years: 7.00    Pack years: 17.50    Types: Cigarettes    Quit date: 03/18/1983    Years since quitting: 37.8   Smokeless tobacco: Never  Vaping Use   Vaping Use: Never used  Substance and Sexual Activity   Alcohol use: Yes    Comment: occasional glass of wine   Drug use: No   Sexual activity: Not on file  Other Topics Concern   Not on file  Social History Narrative   No living will or health care POA thus far   Wife, then son, should make decisions. May make son the primary person   Would accept resuscitation attempts but wouldn't want to be on machines   No tube feeds if cognitively unaware   Social Determinants of Health   Financial Resource Strain: Not on file  Food Insecurity: Not on file  Transportation Needs: Not on file  Physical Activity: Not on file  Stress: Not on file  Social Connections: Not on file  Intimate Partner Violence: Not on file     PHYSICAL EXAM: Vitals:   01/02/21 1532  BP: 131/77  Pulse: 80  SpO2: 97%   General: No acute distress Head:  Normocephalic/atraumatic Skin/Extremities: No rash, no edema Neurological Exam: alert and oriented to person, place, and time. No aphasia or dysarthria. Fund of knowledge is appropriate.  Attention and concentration are normal.   Cranial nerves: Pupils equal, round. Extraocular movements intact with no nystagmus. Visual fields full.  No facial asymmetry.  Motor: Bulk and tone normal, muscle strength 5/5 throughout with no pronator drift.   Finger to nose testing intact.  Gait narrow-based and steady, no ataxia.   IMPRESSION: This is a 70 yo RH man with a  history of hypertension, hyperlipidemia, OSA, diabetes, anxiety, depression, who presented for memory loss. MRI brain no acute changes, there is mild diffuse atrophy, no significant chronic microvascular disease. We discussed Neuropsychological evaluation results done 05/2020 with a diagnosis of Mild Neurocognitive disorder, etiology unclear, presentation not consistent with AD and most likely culprit felt due to combination of sleep dysfunction, Xanax side effects, cardiovascular disease, and various psychosocial stressors. He continues to note sleep and stress issues, however they both report worsening problems with remembering and completing tasks. He now needs help with medications. We discussed addressing treatable causes of memory dysfunction, home sleep study will be ordered to further evaluate sleep dysfunction. He will discuss depression and weight loss with his PCP. He reports spinning with positional changes and will be referred for Vestibular therapy. We again discussed the importance of control of vascular risk factors, physical and brain stimulation exercises, MIND diet for overall brain health. Follow-up in 6 months, they know to call for any changes.    Thank you for allowing me to participate in his care.  Please do not hesitate to call for any questions or concerns.    Ellouise Newer, M.D.   CC: Dr. [name]

## 2021-01-04 ENCOUNTER — Encounter: Payer: Self-pay | Admitting: Internal Medicine

## 2021-02-22 ENCOUNTER — Other Ambulatory Visit: Payer: Self-pay | Admitting: Internal Medicine

## 2021-02-25 NOTE — Telephone Encounter (Signed)
Alprazolam last filled 11-26-20 #90 Last OV 08-29-20 Next OV 03-01-21 [facility]

## 2021-03-01 ENCOUNTER — Encounter: Payer: Self-pay | Admitting: Internal Medicine

## 2021-03-01 ENCOUNTER — Other Ambulatory Visit: Payer: Self-pay

## 2021-03-01 ENCOUNTER — Ambulatory Visit (INDEPENDENT_AMBULATORY_CARE_PROVIDER_SITE_OTHER): Payer: Medicare HMO | Admitting: Internal Medicine

## 2021-03-01 VITALS — BP 110/70 | HR 97 | Temp 97.6°F | Ht 74.25 in | Wt 245.0 lb

## 2021-03-01 DIAGNOSIS — Z0000 Encounter for general adult medical examination without abnormal findings: Secondary | ICD-10-CM | POA: Insufficient documentation

## 2021-03-01 DIAGNOSIS — F39 Unspecified mood [affective] disorder: Secondary | ICD-10-CM | POA: Insufficient documentation

## 2021-03-01 DIAGNOSIS — N401 Enlarged prostate with lower urinary tract symptoms: Secondary | ICD-10-CM

## 2021-03-01 DIAGNOSIS — E1142 Type 2 diabetes mellitus with diabetic polyneuropathy: Secondary | ICD-10-CM | POA: Diagnosis not present

## 2021-03-01 DIAGNOSIS — K219 Gastro-esophageal reflux disease without esophagitis: Secondary | ICD-10-CM

## 2021-03-01 DIAGNOSIS — Z23 Encounter for immunization: Secondary | ICD-10-CM

## 2021-03-01 DIAGNOSIS — N138 Other obstructive and reflux uropathy: Secondary | ICD-10-CM | POA: Diagnosis not present

## 2021-03-01 DIAGNOSIS — G3184 Mild cognitive impairment, so stated: Secondary | ICD-10-CM | POA: Diagnosis not present

## 2021-03-01 DIAGNOSIS — I10 Essential (primary) hypertension: Secondary | ICD-10-CM | POA: Diagnosis not present

## 2021-03-01 DIAGNOSIS — R69 Illness, unspecified: Secondary | ICD-10-CM | POA: Diagnosis not present

## 2021-03-01 LAB — COMPREHENSIVE METABOLIC PANEL
ALT: 17 U/L (ref 0–53)
AST: 13 U/L (ref 0–37)
Albumin: 3.9 g/dL (ref 3.5–5.2)
Alkaline Phosphatase: 66 U/L (ref 39–117)
BUN: 17 mg/dL (ref 6–23)
CO2: 28 mEq/L (ref 19–32)
Calcium: 9.2 mg/dL (ref 8.4–10.5)
Chloride: 104 mEq/L (ref 96–112)
Creatinine, Ser: 0.96 mg/dL (ref 0.40–1.50)
GFR: 79.91 mL/min (ref >60.00)
Glucose, Bld: 181 mg/dL — ABNORMAL HIGH (ref 70–99)
Potassium: 4 mEq/L (ref 3.5–5.1)
Sodium: 138 mEq/L (ref 135–145)
Total Bilirubin: 0.4 mg/dL (ref 0.2–1.2)
Total Protein: 6.7 g/dL (ref 6.0–8.3)

## 2021-03-01 LAB — CBC
HCT: 38 % — ABNORMAL LOW (ref 39.0–52.0)
Hemoglobin: 12.8 g/dL — ABNORMAL LOW (ref 13.0–17.0)
MCHC: 33.8 g/dL (ref 30.0–36.0)
MCV: 86.8 fl (ref 78.0–100.0)
Platelets: 243 10*3/uL (ref 150.0–400.0)
RBC: 4.37 Mil/uL (ref 4.22–5.81)
RDW: 13.2 % (ref 11.5–15.5)
WBC: 4.6 10*3/uL (ref 4.0–10.5)

## 2021-03-01 LAB — HM DIABETES FOOT EXAM

## 2021-03-01 LAB — LDL CHOLESTEROL, DIRECT: Direct LDL: 199 mg/dL

## 2021-03-01 LAB — LIPID PANEL
Cholesterol: 243 mg/dL — ABNORMAL HIGH (ref 0–200)
HDL: 43.1 mg/dL (ref >39.00)
NonHDL: 199.86
Total CHOL/HDL Ratio: 6
Triglycerides: 224 mg/dL — ABNORMAL HIGH (ref 0.0–149.0)
VLDL: 44.8 mg/dL — ABNORMAL HIGH (ref 0.0–40.0)

## 2021-03-01 LAB — HEMOGLOBIN A1C: Hgb A1c MFr Bld: 9.2 % — ABNORMAL HIGH (ref 4.6–6.5)

## 2021-03-01 NOTE — Assessment & Plan Note (Signed)
Lab Results  Component Value Date   HGBA1C 7.6 09/03/2020   Still doing okay on exanitide, metformin and glipizide Will check labs Is on statin as well Mild neuropathy

## 2021-03-01 NOTE — Assessment & Plan Note (Signed)
Has some dysthymia with memory problems and having to retire Episodic anxiety--discussed using xanax only for this---not to help sleep

## 2021-03-01 NOTE — Assessment & Plan Note (Signed)
Ongoing symptoms despite the tamsulosin and finasteride Not ready for urology though

## 2021-03-01 NOTE — Patient Instructions (Signed)
You can try over the counter diclofenac gel for your fingers

## 2021-03-01 NOTE — Assessment & Plan Note (Signed)
Symptoms controlled with daily nexium

## 2021-03-01 NOTE — Assessment & Plan Note (Signed)
I have personally reviewed the Medicare Annual Wellness questionnaire and have noted 1. The patient's medical and social history 2. Their use of alcohol, tobacco or illicit drugs 3. Their current medications and supplements 4. The patient's functional ability including ADL's, fall risks, home safety risks and hearing or visual             impairment. 5. Diet and physical activities 6. Evidence for depression or mood disorders  The patients weight, height, BMI and visual acuity have been recorded in the chart I have made referrals, counseling and provided education to the patient based review of the above and I have provided the pt with a written personalized care plan for preventive services.  I have provided you with a copy of your personalized plan for preventive services. Please take the time to review along with your updated medication list.  Colon due 2024 Consider one last PSA next year Needs to exercise--discussed Due for bivalent COVID vaccine Flu vaccine today

## 2021-03-01 NOTE — Assessment & Plan Note (Signed)
Doing okay on olmesartan hCTZ

## 2021-03-01 NOTE — Assessment & Plan Note (Signed)
No true functional changes Continues with Dr Delice Lesch

## 2021-03-01 NOTE — Progress Notes (Signed)
Subjective:    Patient ID: Alejandro Koch, male    DOB: 1950-07-14, 70 y.o.   MRN: [id number]  HPI Here with wife for Medicare wellness visit and follow up of chronic health conditions Reviewed advanced directives Reviewed other doctors---Dr Aquino/Merz--neurology, Dr Daun Peacock, Dr Brigitte Pulse [facility] Dr Avon Gully No hospitalizations or surgery Vision is okay Tinnitus is worse. Some hearing problems. No recent ear exams Occasional alcohol No tobacco No exercise--but walks and stays active Independent with instrumental ADLs  Has lost some weight Not really trying---eats less  Checking sugars regularly--- 80-200 fasting. Average ~150? Some numbness in toes and feet. Not much burning  No chest pain  No SOB Some dizziness---not postural. More first thing in the morning. No syncope  Cognitive status is progressing slowly Trouble with directions in the car All businesses are gone now Supposed to be evaluated for sleep apnea---doesn't sleep a lot but wife doesn't note apnea Still uses the xanax at night--discussed that this is a bad idea. Does get stressed out at times. Does have some mood issues with life changes  Voids okay Stream is acceptable Stable nocturia  Continues on the OTC nexium Heartburn controlled on this and no dysphagia  Current Outpatient Medications on File Prior to Visit  Medication Sig Dispense Refill   ALPRAZolam (XANAX) 0.5 MG tablet TAKE 1 TABLET BY MOUTH THREE TIMES A DAY AS NEEDED 90 tablet 0   atorvastatin (LIPITOR) 20 MG tablet TAKE 1 TABLET (20 MG TOTAL) BY MOUTH DAILY. 90 tablet 3   Blood Glucose Monitoring Suppl (ONETOUCH VERIO REFLECT) w/Device KIT USE DAILY AS DIRECTED 1 kit 0   esomeprazole (NEXIUM) 20 MG capsule Take 40 mg by mouth 2 (two) times daily before a meal.     Exenatide ER (BYDUREON BCISE) 2 MG/0.85ML AUIJ INJECT 2 MG INTO THE SKIN ONCE A WEEK. 10.2 mL 11   FARXIGA 5 MG TABS tablet TAKE 1 TABLET BY MOUTH EVERY DAY 30  tablet 11   finasteride (PROSCAR) 5 MG tablet TAKE 1 TABLET BY MOUTH EVERY DAY 90 tablet 3   glipiZIDE (GLUCOTROL XL) 10 MG 24 hr tablet TAKE 1 TABLET (10 MG TOTAL) BY MOUTH DAILY WITH BREAKFAST. 90 tablet 3   glucose blood (ONETOUCH VERIO) test strip Use 1 strip daily to check blood sugar. Dx Code E11.49 50 strip 11   hydrocortisone 2.5 % cream APPLY TOPICALLY 3 (THREE) TIMES DAILY AS NEEDED. 28 g 3   ketorolac (ACULAR) 0.5 % ophthalmic solution USE AS DIRECTED 5 mL 1   metFORMIN (GLUCOPHAGE-XR) 500 MG 24 hr tablet TAKE 3 TABLETS BY MOUTH DAILY WITH BREAKFAST. 270 tablet 3   Meth-Hyo-M Bl-Na Phos-Ph Sal (URO-MP) 118 MG CAPS Take 1 capsule by mouth every 6 (six) hours as needed.     olmesartan-hydrochlorothiazide (BENICAR HCT) 20-12.5 MG tablet Take 1 tablet by mouth daily. 30 tablet 11   ONETOUCH DELICA LANCETS FINE MISC 1 Units by Does not apply route daily. 100 each 2   tamsulosin (FLOMAX) 0.4 MG CAPS capsule TAKE 1 CAPSULE (0.4 MG TOTAL) BY MOUTH DAILY AFTER SUPPER. (Patient taking differently: 0.8 mg.) 90 capsule 3   urea (CARMOL) 40 % CREA APPLY PEA SIZED AMOUNT TO HARD AREA BELOW PINKY TOE TWICE A DAY 28.35 g 1   vitamin B-12 (CYANOCOBALAMIN) 1000 MCG tablet Take 1 tablet (1,000 mcg total) by mouth daily.     albuterol (PROVENTIL HFA;VENTOLIN HFA) 108 (90 Base) MCG/ACT inhaler Inhale into the lungs.     nitroGLYCERIN (  NITROSTAT) 0.4 MG SL tablet Place under the tongue.     No current facility-administered medications on file prior to visit.    Allergies  Allergen Reactions   Iohexol      Code: HIVES, Desc: PT IS ALLERGIC TO IVP DYE AS NOTED IN PREVIOUS RECORDS IN PACS 02/2009/RM, Onset Date: [id number]    Atorvastatin Other (See Comments)    Myalgias with this and rosuvastin   Metformin And Related Diarrhea    Past Medical History:  Diagnosis Date   Abdominal discomfort, epigastric 06/10/2017   Actinic keratoses 05/12/2014   Acute chest pain 12/20/2007   Acute prostatitis  06/10/2017   Classic presentation; will treat with sulfa x 2 weeks. He did well with this back in March   Allergic rhinitis 09/30/2006   Anemia    Benign essential hypertension 09/30/2006   BP Readings from Last 3 Encounters: 06/16/17 138/72  06/09/17 118/66  12/09/16 136/82   Reasonable control   Blood transfusion without reported diagnosis    40 yrs ago    BPH with obstruction/lower urinary tract symptoms 12/27/2018   Still with mild symptoms. If worsens, will need to go back to the urologist   CHF (congestive heart failure)    Chronic cough 07/16/2018   Fits definition of chronic bronchitis except not 2 years in a row. Seeing the pulmonary doctor. Doesn't feel the breo has helped   Extensor tendon laceration of right hand with open wound 09/16/2016   Fatigue 06/10/2017   Fatty liver 03/01/2010   LFTs normal. Still needs more weight loss   GERD (gastroesophageal reflux disease) 09/30/2006   Tried omeprazole and symptoms all came back. Will order the nexium again   Glaucoma    Hyperlipemia 09/30/2006   Inconsistent with the atorvastatin. Discussed---will take it twice a week   Hyperlipidemia    Hypogonadism in male 09/19/2009   Kidney stones    Laryngopharyngeal reflux (LPR) 06/03/2016   Left foot pain 09/20/2019   Major depressive disorder    Mild neurocognitive disorder 05/18/2020   OSA (obstructive sleep apnea) 09/30/2006   NPSG 07/2014:  AHI 23/hr, PLMS 452 with 12/hr with A/A NPSG 07/2014:  AHI 23/hr, PLMS 452 with 12/hr with A/A. The patient has moderate obstructive sleep apnea by his recent study, and is very symptomatic during the day. I have discussed with him the various treatment options, and have recommended a trial of C P   Shortness of breath 05/21/2015   09/11/17 Right and Left Heart cath: Right radial and brachial access.Left dominant coronary system with no CAD RHC with some exercise showed RA 15, 14 mm Hg (mean 12)PA 37/14  mean 27,PWP 17,15 mean 16   Type 2 diabetes  mellitus with neurological manifestations, uncontrolled 03/05/2009   HGBA1C 8.7 (A) 03/30/2018. Not much better. I want to send him to endocrine--he wants to hold off (doesn't want insulin, etc) Needs formal help---like Weight Watchers Will give him 3 months more   Vitamin B12 deficiency 06/16/2017   Does take supplement daily. Will check with full labs at next visit    Past Surgical History:  Procedure Laterality Date   CARDIAC CATHETERIZATION     [facility] - [facility] perf and echo done as well - all normal- EF 48-55% 2019 April and june 2019   COLONOSCOPY  2008   FINGER SURGERY  2013   infection 2nd finger left hand   POLYPECTOMY     REPAIR FLEXOR TENDON HAND Right 08/2016    Family History  Problem Relation Age of Onset   Alzheimer's disease Mother    Heart disease Father    COPD Father    Colon cancer Father 26   Prostate cancer Father    Dementia Father    Colon polyps Brother    Alzheimer's disease Maternal Grandmother    Leukemia Brother    Leukemia Other    Stomach cancer Neg Hx    Esophageal cancer Neg Hx    Rectal cancer Neg Hx     Social History   Socioeconomic History   Marital status: Married    Spouse name: Not on file   Number of children: 1   Years of education: 14   Highest education level: Associate degree: academic program  Occupational History   Occupation: Retired    Comment: EMS, Customer service manager  Tobacco Use   Smoking status: Former    Packs/day: 2.50    Years: 7.00    Pack years: 17.50    Types: Cigarettes    Quit date: 03/18/1983    Years since quitting: 37.9   Smokeless tobacco: Never  Vaping Use   Vaping Use: Never used  Substance and Sexual Activity   Alcohol use: Yes    Comment: occasional glass of wine   Drug use: No   Sexual activity: Not on file  Other Topics Concern   Not on file  Social History Narrative   No living will or health care POA thus far   Wife, then son, should make decisions. May make son the primary person   Would  accept resuscitation attempts but wouldn't want to be on machines   No tube feeds if cognitively unaware   Social Determinants of Health   Financial Resource Strain: Not on file  Food Insecurity: Not on file  Transportation Needs: Not on file  Physical Activity: Not on file  Stress: Not on file  Social Connections: Not on file  Intimate Partner Violence: Not on file   Review of Systems Weight is down Wears seat belt Teeth okay ---sees dentist No suspicious skin lesions. Does have lesions on pinnas--not new Bowels moving fine--no blood No sig back or joint pains    Objective:   Physical Exam Constitutional:      Appearance: Normal appearance.  Cardiovascular:     Rate and Rhythm: Normal rate and regular rhythm.     Pulses: Normal pulses.     Heart sounds: No murmur heard.   No gallop.  Pulmonary:     Effort: Pulmonary effort is normal.     Breath sounds: Normal breath sounds. No wheezing or rales.  Musculoskeletal:     Cervical back: Neck supple.     Right lower leg: No edema.     Left lower leg: No edema.     Comments: Mild arthritic changes in fingers  Lymphadenopathy:     Cervical: No cervical adenopathy.  Skin:    Findings: No lesion or rash.     Comments: No foot lesions  Neurological:     Mental Status: He is alert and oriented to person, place, and time.     Comments: Mildly decreased sensation in feet           Assessment & Plan:

## 2021-03-04 ENCOUNTER — Other Ambulatory Visit: Payer: Self-pay

## 2021-03-14 ENCOUNTER — Other Ambulatory Visit: Payer: Self-pay

## 2021-03-14 MED ORDER — ROSUVASTATIN CALCIUM 10 MG PO TABS: 10.0000 mg | ORAL_TABLET | Freq: Every day | ORAL | 3 refills | Status: DC

## 2021-05-01 ENCOUNTER — Other Ambulatory Visit: Payer: Self-pay | Admitting: Internal Medicine

## 2021-05-02 NOTE — Telephone Encounter (Signed)
Alprazolam last filled 02-25-21 #90 Last OV 02-22-21 Next OV 05-31-21 [facility]

## 2021-05-13 ENCOUNTER — Other Ambulatory Visit: Payer: Self-pay | Admitting: Internal Medicine

## 2021-05-15 ENCOUNTER — Other Ambulatory Visit: Payer: Self-pay | Admitting: Internal Medicine

## 2021-05-31 ENCOUNTER — Other Ambulatory Visit: Payer: Self-pay

## 2021-05-31 ENCOUNTER — Encounter: Payer: Self-pay | Admitting: Internal Medicine

## 2021-05-31 ENCOUNTER — Ambulatory Visit (INDEPENDENT_AMBULATORY_CARE_PROVIDER_SITE_OTHER): Payer: Medicare HMO | Admitting: Internal Medicine

## 2021-05-31 VITALS — BP 130/72 | HR 85 | Temp 97.0°F | Ht 74.25 in | Wt 251.0 lb

## 2021-05-31 DIAGNOSIS — E1142 Type 2 diabetes mellitus with diabetic polyneuropathy: Secondary | ICD-10-CM

## 2021-05-31 LAB — POCT GLYCOSYLATED HEMOGLOBIN (HGB A1C): Hemoglobin A1C: 8.7 % — AB (ref 4.0–5.6)

## 2021-05-31 MED ORDER — DAPAGLIFLOZIN PROPANEDIOL 10 MG PO TABS: 10.0000 mg | ORAL_TABLET | Freq: Every day | ORAL | 3 refills | Status: DC

## 2021-05-31 MED ORDER — OLMESARTAN MEDOXOMIL-HCTZ 20-12.5 MG PO TABS
0.5000 | ORAL_TABLET | Freq: Every day | ORAL | 0 refills | Status: DC
Start: 2021-05-31 — End: 2022-03-18

## 2021-05-31 MED ORDER — METFORMIN HCL ER 500 MG PO TB24: 1000.0000 mg | ORAL_TABLET | Freq: Two times a day (BID) | ORAL | 3 refills | Status: DC

## 2021-05-31 MED ORDER — TAMSULOSIN HCL 0.4 MG PO CAPS: 0.8000 mg | ORAL_CAPSULE | Freq: Every day | ORAL | 0 refills | Status: DC

## 2021-05-31 NOTE — Assessment & Plan Note (Signed)
Lab Results  ?Component Value Date  ? HGBA1C 8.7 (A) 05/31/2021  ? ?Has come down slightly--but not at goal, even for his age ?Will increase the farxiga to 10 ?Increase metformin to 1000 bid ?Continue glipizide 10 daily and exenatide '2mg'$  weekly ? ?Discussed improved eating---limited sweets and carbs ?

## 2021-05-31 NOTE — Progress Notes (Signed)
? ?Subjective:  ? ? Patient ID: Alejandro Koch, male    DOB: October 11, 1950, 71 y.o.   MRN: [id number] ? ?HPI ?Here for follow up of poorly controlled diabetes ? ?Sugars have been better ?Was 200 yesterday---but usually 120-130 and occasionally down to 80 ?Checks three times a week ? ?Is eating less ?Thought he lost more weight--but up ~5# since December ?Wife notes he is not really being careful--but is eating less (also was at the beach for 2 weeks recently) ? ?Current Outpatient Medications on File Prior to Visit  ?Medication Sig Dispense Refill  ? ALPRAZolam (XANAX) 0.5 MG tablet TAKE 1 TABLET BY MOUTH THREE TIMES A DAY AS NEEDED 90 tablet 0  ? Blood Glucose Monitoring Suppl (ONETOUCH VERIO REFLECT) w/Device KIT USE DAILY AS DIRECTED 1 kit 0  ? esomeprazole (NEXIUM) 20 MG capsule Take 40 mg by mouth 2 (two) times daily before a meal.    ? Exenatide ER (BYDUREON BCISE) 2 MG/0.85ML AUIJ INJECT 2 MG INTO THE SKIN ONCE A WEEK. 10.2 mL 11  ? FARXIGA 5 MG TABS tablet TAKE 1 TABLET BY MOUTH EVERY DAY 30 tablet 11  ? finasteride (PROSCAR) 5 MG tablet TAKE 1 TABLET BY MOUTH EVERY DAY 90 tablet 3  ? glipiZIDE (GLUCOTROL XL) 10 MG 24 hr tablet TAKE 1 TABLET (10 MG TOTAL) BY MOUTH DAILY WITH BREAKFAST. 90 tablet 3  ? glucose blood (ONETOUCH VERIO) test strip Use 1 strip daily to check blood sugar. Dx Code E11.49 50 strip 11  ? hydrocortisone 2.5 % cream APPLY TOPICALLY 3 TIMES DAILY AS NEEDED. 28 g 3  ? ketorolac (ACULAR) 0.5 % ophthalmic solution USE AS DIRECTED 5 mL 1  ? metFORMIN (GLUCOPHAGE-XR) 500 MG 24 hr tablet TAKE 3 TABLETS BY MOUTH DAILY WITH BREAKFAST. 270 tablet 3  ? Meth-Hyo-M Bl-Na Phos-Ph Sal (URO-MP) 118 MG CAPS Take 1 capsule by mouth every 6 (six) hours as needed.    ? ONETOUCH DELICA LANCETS FINE MISC 1 Units by Does not apply route daily. 100 each 2  ? rosuvastatin (CRESTOR) 10 MG tablet Take 1 tablet (10 mg total) by mouth daily. 90 tablet 3  ? urea (CARMOL) 40 % CREA APPLY PEA SIZED AMOUNT TO HARD AREA  BELOW PINKY TOE TWICE A DAY 28.35 g 1  ? vitamin B-12 (CYANOCOBALAMIN) 1000 MCG tablet Take 1 tablet (1,000 mcg total) by mouth daily.    ? albuterol (PROVENTIL HFA;VENTOLIN HFA) 108 (90 Base) MCG/ACT inhaler Inhale into the lungs.    ? nitroGLYCERIN (NITROSTAT) 0.4 MG SL tablet Place under the tongue.    ? ?No current facility-administered medications on file prior to visit.  ? ? ?Allergies  ?Allergen Reactions  ? Iohexol   ?   Code: HIVES, Desc: PT IS ALLERGIC TO IVP DYE AS NOTED IN PREVIOUS RECORDS IN PACS 02/2009/RM, Onset Date: [id number] ?  ? Atorvastatin Other (See Comments)  ?  Myalgias with this and rosuvastin  ? Metformin And Related Diarrhea  ? ? ?Past Medical History:  ?Diagnosis Date  ? Abdominal discomfort, epigastric 06/10/2017  ? Actinic keratoses 05/12/2014  ? Acute chest pain 12/20/2007  ? Acute prostatitis 06/10/2017  ? Classic presentation; will treat with sulfa x 2 weeks. He did well with this back in March  ? Allergic rhinitis 09/30/2006  ? Anemia   ? Benign essential hypertension 09/30/2006  ? BP Readings from Last 3 Encounters: 06/16/17 138/72  06/09/17 118/66  12/09/16 136/82   Reasonable control  ? Blood transfusion  without reported diagnosis   ? 40 yrs ago   ? BPH with obstruction/lower urinary tract symptoms 12/27/2018  ? Still with mild symptoms. If worsens, will need to go back to the urologist  ? CHF (congestive heart failure)   ? Chronic cough 07/16/2018  ? Fits definition of chronic bronchitis except not 2 years in a row. Seeing the pulmonary doctor. Doesn't feel the breo has helped  ? Extensor tendon laceration of right hand with open wound 09/16/2016  ? Fatigue 06/10/2017  ? Fatty liver 03/01/2010  ? LFTs normal. Still needs more weight loss  ? GERD (gastroesophageal reflux disease) 09/30/2006  ? Tried omeprazole and symptoms all came back. Will order the nexium again  ? Glaucoma   ? Hyperlipemia 09/30/2006  ? Inconsistent with the atorvastatin. Discussed---will take it twice a week   ? Hyperlipidemia   ? Hypogonadism in male 09/19/2009  ? Kidney stones   ? Laryngopharyngeal reflux (LPR) 06/03/2016  ? Left foot pain 09/20/2019  ? Major depressive disorder   ? Mild neurocognitive disorder 05/18/2020  ? OSA (obstructive sleep apnea) 09/30/2006  ? NPSG 07/2014:  AHI 23/hr, PLMS 452 with 12/hr with A/A NPSG 07/2014:  AHI 23/hr, PLMS 452 with 12/hr with A/A. The patient has moderate obstructive sleep apnea by his recent study, and is very symptomatic during the day. I have discussed with him the various treatment options, and have recommended a trial of C P  ? Shortness of breath 05/21/2015  ? 09/11/17 Right and Left Heart cath: Right radial and brachial access.Left dominant coronary system with no CAD RHC with some exercise showed RA 15, 14 mm Hg (mean 12)PA 37/14  mean 27,PWP 17,15 mean 16  ? Type 2 diabetes mellitus with neurological manifestations, uncontrolled 03/05/2009  ? HGBA1C 8.7 (A) 03/30/2018. Not much better. I want to send him to endocrine--he wants to hold off (doesn't want insulin, etc) Needs formal help---like Weight Watchers Will give him 3 months more  ? Vitamin B12 deficiency 06/16/2017  ? Does take supplement daily. Will check with full labs at next visit  ? ? ?Past Surgical History:  ?Procedure Laterality Date  ? CARDIAC CATHETERIZATION    ? [facility] - myocard perf and echo done as well - all normal- EF 48-55% 2019 April and june 2019  ? COLONOSCOPY  2008  ? FINGER SURGERY  2013  ? infection 2nd finger left hand  ? POLYPECTOMY    ? REPAIR FLEXOR TENDON HAND Right 08/2016  ? ? ?Family History  ?Problem Relation Age of Onset  ? Alzheimer's disease Mother   ? Heart disease Father   ? COPD Father   ? Colon cancer Father 51  ? Prostate cancer Father   ? Dementia Father   ? Colon polyps Brother   ? Alzheimer's disease Maternal Grandmother   ? Leukemia Brother   ? Leukemia Other   ? Stomach cancer Neg Hx   ? Esophageal cancer Neg Hx   ? Rectal cancer Neg Hx   ? ? ?Social History   ? ?Socioeconomic History  ? Marital status: Married  ?  Spouse name: Not on file  ? Number of children: 1  ? Years of education: 32  ? Highest education level: Associate degree: academic program  ?Occupational History  ? 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 Smoking status: Former  ?  Packs/day: 2.50  ?  Years: 7.00  ?  Pack years: 17.50  ?  Types: Cigarettes  ?  Quit date: 03/18/1983  ?  Years since quitting: 38.2  ? Smokeless tobacco: Never  ?Vaping Use  ? Vaping Use: Never used  ?Substance and Sexual Activity  ? Alcohol use: Yes  ?  Comment: occasional glass of wine  ? Drug use: No  ? Sexual activity: Not on file  ?Other Topics Concern  ? Not on file  ?Social History Narrative  ? No living will or health care POA thus far  ? Wife, then son, should make decisions. May make son the primary person  ? Would accept resuscitation attempts but wouldn't want to be on machines  ? No tube feeds if cognitively unaware  ? ?Social Determinants of Health  ? ?Financial Resource Strain: Not on file  ?Food Insecurity: Not on file  ?Transportation Needs: Not on file  ?Physical Activity: Not on file  ?Stress: Not on file  ?Social Connections: Not on file  ?Intimate Partner Violence: Not on file  ? ?Review of Systems ?Sleeping "fair---not real good". No real change lately. Ambien in the past ?No stomach problems ?No exercise---but on the go all the time ? ?   ?Objective:  ? Physical Exam ?Constitutional:   ?   Appearance: Normal appearance.  ?Neurological:  ?   Mental Status: He is alert.  ?Psychiatric:     ?   Mood and Affect: Mood normal.     ?   Behavior: Behavior normal.  ?  ? ? ? ? ?   ?Assessment & Plan:  ? ?

## 2021-07-05 ENCOUNTER — Encounter: Payer: Self-pay | Admitting: Neurology

## 2021-07-05 ENCOUNTER — Ambulatory Visit: Payer: Medicare HMO | Admitting: Neurology

## 2021-07-05 VITALS — BP 114/70 | HR 92 | Ht 77.0 in | Wt 248.0 lb

## 2021-07-05 DIAGNOSIS — G3184 Mild cognitive impairment, so stated: Secondary | ICD-10-CM

## 2021-07-05 DIAGNOSIS — G4733 Obstructive sleep apnea (adult) (pediatric): Secondary | ICD-10-CM | POA: Diagnosis not present

## 2021-07-05 DIAGNOSIS — R42 Dizziness and giddiness: Secondary | ICD-10-CM | POA: Diagnosis not present

## 2021-07-05 NOTE — Progress Notes (Signed)
? ?NEUROLOGY FOLLOW UP OFFICE NOTE ? ?[name] ?[id number] ?1950-06-19 ? ?HISTORY OF PRESENT ILLNESS: ?I had the pleasure of seeing Alejandro Koch in follow-up in the neurology clinic on 07/05/2021.  The patient was last seen 6 months ago for memory loss. He is again accompanied by his wife who helps supplement the history today.  Records and images were personally reviewed where available.  Neuropsychological testing in 05/2020 with a diagnosis of Mild Neurocognitive Disorder, etiology unclear. Findings were not consistent with Alzheimer's disease, most likely culprit felt to be a combination of sleep dysfunction, Xanax side effects, and cardiovascular disease, various psychosocial stressors. On his last visit, his wife reported worsening memory and difficulty focusing. A sleep study was ordered however he forgot to call them back to schedule. He continues to report dizziness but did not do vestibular therapy recommended on last visit. He states his memory is "okay, slipping a bit." His wife agrees that memory is worsening. He cannot keep up with things, cannot get things done, and gets frustrated. His wife notes he gets a little agitated after 2-3 pm when he is tired. No paranoia or hallucinations. He does not remember where things. He continues to drive and denies getting lost driving. His wife fixes his pillbox and he takes them by himself without missing any doses. His wife manages finances. He has cut back a lot on the Xanax, sometimes he takes 1/2 tab when he cannot sleep, around twice a week. He started taking melatonin. He states he is not stressed as he used to be. He has chronic tinnitus which "drives me nuts." He has lost weight because he does not care for food so much anymore. He denies any headaches. The dizziness is described as spinning, sometimes occurring even when sitting, lasting only a few seconds. He also feels lightheaded and keeps telling his "my head does not feel right." No falls.   ? ? ?History on Initial Assessment 05/15/2020: This is a 71 year old right-handed man with a history of hypertension, hyperlipidemia, OSA, diabetes, anxiety, depression, presenting for evaluation of memory loss. He feels hie memory is not like it's supposed to be, he used to be pretty sharp and great with numbers. He lives with his wife. His wife started noticing changes about a year ago, he started having mood changes, a lot of edgy when he is usually even-keel. He was losing things frequently and appetite was not like it was. He was losing weight and reporting dizziness. He would repeat himself and get frustrated easily. She noticed his attention span would not be longer than 10-15 minutes, he would get easily distracted. 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 He has occasional word-finding difficulties when he used to be pretty accurate. He denies getting lost driving but has trouble remembering directions to the beach and questions himself more now. He denies missing medications or bill payments. His mother had Alzheimer's disease, his father had memory issues. He rarely drinks alcohol. He had a fall last Christmas where he lost consciousness. No neurosurgical procedures. He thinks his mood is better, he was in a very stressful work environment but did notice he used to be more tolerant but is now more irritable. His wife denies any paranoia or hallucinations. ? ?He states he never had headaches in the past, but over the last 3 months he has had a low grade frontal headache that would occur infrequently. No associated nausea/vomiting. He has dizziness when sitting  but more when standing. He describes a spinning sensation where he has to hold on for a few seconds to get his bearings, no falls. He denies any diplopia, dysarthria/dysphagia, neck/back pain, focal numbness/tingling/weakness, bowel/bladder dysfunction, anosmia, or tremors. Sleep is good with Xanax every  night.  ? ?Diagnostic Data: ?MRI brain without contrast done 02/2020 did not show any acute changes, there was mild diffuse atrophy. ? ?Neuropsychological testing in 05/2020: diagnosis of Mild Neurocognitive Disorder, etiology unclear. Findings were not consistent with Alzheimer's disease, most likely culprit felt to be a combination of sleep dysfunction, Xanax side effects, and cardiovascular disease, various psychosocial stressors. ? ?Laboratory Data: ?Lab Results  ?Component Value Date  ? TSH 2.24 06/09/2017  ? ?Lab Results  ?Component Value Date  ? [id number] 08/29/2020  ? ?PAST MEDICAL HISTORY: ?Past Medical History:  ?Diagnosis Date  ? Abdominal discomfort, epigastric 06/10/2017  ? Actinic keratoses 05/12/2014  ? Acute chest pain 12/20/2007  ? Acute prostatitis 06/10/2017  ? Classic presentation; will treat with sulfa x 2 weeks. He did well with this back in March  ? Allergic rhinitis 09/30/2006  ? Anemia   ? Benign essential hypertension 09/30/2006  ? BP Readings from Last 3 Encounters: 06/16/17 138/72  06/09/17 118/66  12/09/16 136/82   Reasonable control  ? Blood transfusion without reported diagnosis   ? 40 yrs ago   ? BPH with obstruction/lower urinary tract symptoms 12/27/2018  ? Still with mild symptoms. If worsens, will need to go back to the urologist  ? CHF (congestive heart failure)   ? Chronic cough 07/16/2018  ? Fits definition of chronic bronchitis except not 2 years in a row. Seeing the pulmonary doctor. Doesn't feel the breo has helped  ? Extensor tendon laceration of right hand with open wound 09/16/2016  ? Fatigue 06/10/2017  ? Fatty liver 03/01/2010  ? LFTs normal. Still needs more weight loss  ? GERD (gastroesophageal reflux disease) 09/30/2006  ? Tried omeprazole and symptoms all came back. Will order the nexium again  ? Glaucoma   ? Hyperlipemia 09/30/2006  ? Inconsistent with the atorvastatin. Discussed---will take it twice a week  ? Hyperlipidemia   ? Hypogonadism in male 09/19/2009   ? Kidney stones   ? Laryngopharyngeal reflux (LPR) 06/03/2016  ? Left foot pain 09/20/2019  ? Major depressive disorder   ? Mild neurocognitive disorder 05/18/2020  ? OSA (obstructive sleep apnea) 09/30/2006  ? NPSG 07/2014:  AHI 23/hr, PLMS 452 with 12/hr with A/A NPSG 07/2014:  AHI 23/hr, PLMS 452 with 12/hr with A/A. The patient has moderate obstructive sleep apnea by his recent study, and is very symptomatic during the day. I have discussed with him the various treatment options, and have recommended a trial of C P  ? Shortness of breath 05/21/2015  ? 09/11/17 Right and Left Heart cath: Right radial and brachial access.Left dominant coronary system with no CAD RHC with some exercise showed RA 15, 14 mm Hg (mean 12)PA 37/14  mean 27,PWP 17,15 mean 16  ? Type 2 diabetes mellitus with neurological manifestations, uncontrolled 03/05/2009  ? HGBA1C 8.7 (A) 03/30/2018. Not much better. I want to send him to endocrine--he wants to hold off (doesn't want insulin, etc) Needs formal help---like Weight Watchers Will give him 3 months more  ? Vitamin B12 deficiency 06/16/2017  ? Does take supplement daily. Will check with full labs at next visit  ? ? ?MEDICATIONS: ?Current Outpatient Medications on File Prior to Visit  ?Medication Sig Dispense Refill  ?  albuterol (PROVENTIL HFA;VENTOLIN HFA) 108 (90 Base) MCG/ACT inhaler Inhale into the lungs.    ? ALPRAZolam (XANAX) 0.5 MG tablet TAKE 1 TABLET BY MOUTH THREE TIMES A DAY AS NEEDED 90 tablet 0  ? Blood Glucose Monitoring Suppl (ONETOUCH VERIO REFLECT) w/Device KIT USE DAILY AS DIRECTED 1 kit 0  ? dapagliflozin propanediol (FARXIGA) 10 MG TABS tablet Take 1 tablet (10 mg total) by mouth daily before breakfast. 90 tablet 3  ? esomeprazole (NEXIUM) 20 MG capsule Take 40 mg by mouth 2 (two) times daily before a meal.    ? Exenatide ER (BYDUREON BCISE) 2 MG/0.85ML AUIJ INJECT 2 MG INTO THE SKIN ONCE A WEEK. 10.2 mL 11  ? finasteride (PROSCAR) 5 MG tablet TAKE 1 TABLET BY MOUTH EVERY  DAY 90 tablet 3  ? glipiZIDE (GLUCOTROL XL) 10 MG 24 hr tablet TAKE 1 TABLET (10 MG TOTAL) BY MOUTH DAILY WITH BREAKFAST. 90 tablet 3  ? glucose blood (ONETOUCH VERIO) test strip Use 1 strip daily to check blood

## 2021-07-05 NOTE — Patient Instructions (Addendum)
Good to see you. ? ?Schedule home sleep study ? ?2. Refer for Vestibular therapy ? ?3. Let's see what the sleep study shows, depending on results, we may repeat Neurocognitive testing (if sleep study is negative for sleep apnea) ? ?4. Increase hydration (recommend 8 glasses of water daily) ? ?5. Follow-up in 6 months, call for any changes ? ? ? ?RECOMMENDATIONS FOR ALL PATIENTS WITH MEMORY PROBLEMS: ?1. Continue to exercise (Recommend 30 minutes of walking everyday, or 3 hours every week) ?2. Increase social interactions - continue going to [place] and enjoy social gatherings with friends and family ?3. Eat healthy, avoid fried foods and eat more fruits and vegetables ?4. Maintain adequate blood pressure, blood sugar, and blood cholesterol level. Reducing the risk of stroke and cardiovascular disease also helps promoting better memory. ?5. Avoid stressful situations. Live a simple life and avoid aggravations. Organize your time and prepare for the next day in anticipation. ?6. Sleep well, avoid any interruptions of sleep and avoid any distractions in the bedroom that may interfere with adequate sleep quality ?7. Avoid sugar, avoid sweets as there is a strong link between excessive sugar intake, diabetes, and cognitive impairment ?The Mediterranean diet has been shown to help patients reduce the risk of progressive memory disorders and reduces cardiovascular risk. This includes eating fish, eat fruits and green leafy vegetables, nuts like almonds and hazelnuts, walnuts, and also use olive oil. Avoid fast foods and fried foods as much as possible. Avoid sweets and sugar as sugar use has been linked to worsening of memory function. ? ? ?

## 2021-07-09 ENCOUNTER — Ambulatory Visit: Payer: Medicare HMO | Attending: Neurology | Admitting: Physical Therapy

## 2021-07-09 ENCOUNTER — Encounter: Payer: Self-pay | Admitting: Physical Therapy

## 2021-07-09 ENCOUNTER — Telehealth: Payer: Self-pay | Admitting: Physical Therapy

## 2021-07-09 DIAGNOSIS — R42 Dizziness and giddiness: Secondary | ICD-10-CM | POA: Diagnosis not present

## 2021-07-09 NOTE — Telephone Encounter (Signed)
Hi Dr. Delice Lesch, ? ?I evaluated Mr. Abarca today in OPPT for vertigo per your referral. He presented with some abnormal oculomotor testing including subtle L beating nystagmus with L gaze and R beating nystagmus with R gaze, saccades with smooth pursuits, undershooting with L saccades, corrective saccades with VOR cancellation. Orthostatics were also positive and may contribute to patient's c/o lightheadedness. Believe he may benefit from further CNS workup. Please advise. ? ?Thanks! ? ?Janene Harvey, PT, DPT ? ? ? ? ?

## 2021-07-09 NOTE — Therapy (Signed)
[facility] ?[address], [address] ?[place], [place], [address] ?Phone: [phone]   Fax:  [phone] ? ?Physical Therapy Evaluation ? ?Patient Details  ?Name: Alejandro Koch ?MRN: [id number] ?Date of Birth: Mar 02, 1951 ?Referring Provider (PT): Cameron Sprang, MD ? ? ?Encounter Date: 07/09/2021 ? ? PT End of Session - 07/09/21 1624   ? ? Visit Number 1   ? Number of Visits 1   ? Date for PT Re-Evaluation 07/09/21   ? Authorization Type [employer]   ? PT Start Time 1525   ? PT Stop Time 8264   ? PT Time Calculation (min) 52 min   ? Activity Tolerance Patient tolerated treatment well   ? Behavior During Therapy [facility] for tasks assessed/performed   ? ?  ?  ? ?  ? ? ?Past Medical History:  ?Diagnosis Date  ? Abdominal discomfort, epigastric 06/10/2017  ? Actinic keratoses 05/12/2014  ? Acute chest pain 12/20/2007  ? Acute prostatitis 06/10/2017  ? Classic presentation; will treat with sulfa x 2 weeks. He did well with this back in March  ? Allergic rhinitis 09/30/2006  ? Anemia   ? Benign essential hypertension 09/30/2006  ? BP Readings from Last 3 Encounters: 06/16/17 138/72  06/09/17 118/66  12/09/16 136/82   Reasonable control  ? Blood transfusion without reported diagnosis   ? 40 yrs ago   ? BPH with obstruction/lower urinary tract symptoms 12/27/2018  ? Still with mild symptoms. If worsens, will need to go back to the urologist  ? CHF (congestive heart failure)   ? Chronic cough 07/16/2018  ? Fits definition of chronic bronchitis except not 2 years in a row. Seeing the pulmonary doctor. Doesn't feel the breo has helped  ? Extensor tendon laceration of right hand with open wound 09/16/2016  ? Fatigue 06/10/2017  ? Fatty liver 03/01/2010  ? LFTs normal. Still needs more weight loss  ? GERD (gastroesophageal reflux disease) 09/30/2006  ? Tried omeprazole and symptoms all came back. Will order the nexium again  ? Glaucoma   ? Hyperlipemia 09/30/2006  ? Inconsistent with the  atorvastatin. Discussed---will take it twice a week  ? Hyperlipidemia   ? Hypogonadism in male 09/19/2009  ? Kidney stones   ? Laryngopharyngeal reflux (LPR) 06/03/2016  ? Left foot pain 09/20/2019  ? Major depressive disorder   ? Mild neurocognitive disorder 05/18/2020  ? OSA (obstructive sleep apnea) 09/30/2006  ? NPSG 07/2014:  AHI 23/hr, PLMS 452 with 12/hr with A/A NPSG 07/2014:  AHI 23/hr, PLMS 452 with 12/hr with A/A. The patient has moderate obstructive sleep apnea by his recent study, and is very symptomatic during the day. I have discussed with him the various treatment options, and have recommended a trial of C P  ? Shortness of breath 05/21/2015  ? 09/11/17 Right and Left Heart cath: Right radial and brachial access.Left dominant coronary system with no CAD RHC with some exercise showed RA 15, 14 mm Hg (mean 12)PA 37/14  mean 27,PWP 17,15 mean 16  ? Type 2 diabetes mellitus with neurological manifestations, uncontrolled 03/05/2009  ? HGBA1C 8.7 (A) 03/30/2018. Not much better. I want to send him to endocrine--he wants to hold off (doesn't want insulin, etc) Needs formal help---like Weight Watchers Will give him 3 months more  ? Vitamin B12 deficiency 06/16/2017  ? Does take supplement daily. Will check with full labs at next visit  ? ? ?Past Surgical History:  ?Procedure Laterality Date  ?  CARDIAC CATHETERIZATION    ? [facility] - myocard perf and echo done as well - all normal- EF 48-55% 2019 April and june 2019  ? COLONOSCOPY  2008  ? FINGER SURGERY  2013  ? infection 2nd finger left hand  ? POLYPECTOMY    ? REPAIR FLEXOR TENDON HAND Right 08/2016  ? ? ?There were no vitals filed for this visit. ? ? ? Subjective Assessment - 07/09/21 1526   ? ? Subjective Patient reports dizziness for the past 6 months and is progressively getting worse without specific mechanism. Reports tinnitus in B ears for the past 5 years which is getting louder. Dizziness is described as "lightheaded, off balance" and lasts seconds. Unable  to identify aggravating factors. Lightheadedness now feels constant.  Denies head trauma, infection/illness, otalgia, photophobia, migraines.  Reports some imbalance and some trouble with his vision. Wife reports that she recalls the patient seeing double about a month ago. Patient also reports worsening hearing. Has not seen ENT in a long time. Notes high pitched sounds irritate him.   ? Patient is accompained by: Family member   wife  ? Pertinent History HTN, CHF, GERD, HLD, depression, DMII, Mild Neurocognitive Disorder   ? Limitations Sitting;Reading;Lifting   ? Diagnostic tests brain MRI 03/14/20 normal   ? Patient Stated Goals decrease dizziness   ? Currently in Pain? Other (Comment)   referral not pain-related  ? ?  ?  ? ?  ? ? ? ? ? [facility] PT Assessment - 07/09/21 1534   ? ?  ? Assessment  ? Medical Diagnosis Vertigo   ? Referring Provider (PT) Cameron Sprang, MD   ? Onset Date/Surgical Date 01/08/21   ? Next MD Visit 01/15/22   ? Prior Therapy yes   ?  ? Precautions  ? Precautions None   ?  ? Balance Screen  ? Has the patient fallen in the past 6 months No   ? Has the patient had a decrease in activity level because of a fear of falling?  Yes   ? Is the patient reluctant to leave their home because of a fear of falling?  No   ?  ? Home Environment  ? Additional Comments lives with wife in a 1 story house with 4/7 steps to get inside; owns crutches and cane but does not use it   ?  ? Prior Function  ? Level of Independence Independent   ? Vocation Retired   ? Leisure traveling, golf   ?  ? Cognition  ? Overall Cognitive Status Difficult to assess   ?  ? Sensation  ? Light Touch Appears Intact   reports some light N/T in hands and feet  ? ?  ?  ? ?  ? ? ? ? ? ? ? ? ? Vestibular Assessment - 07/09/21 1538   ? ?  ? Oculomotor Exam  ? Oculomotor Alignment Normal   ? Ocular ROM WNL   ? Spontaneous Absent   ? Gaze-induced  Left beating nystagmus with L gaze;Right beating nystagmus with R gaze;Direction changing  nystagmus   ? Smooth Pursuits Saccades   1 saccade in horizontal direciton, 2-3 vertically  ? Saccades Undershoots   1-2 saccades to get to target to L; intact vertical  ?  ? Oculomotor Exam-Fixation Suppressed   ? Left Head Impulse negative   ? Right Head Impulse negative   ?  ? Vestibulo-Ocular Reflex  ? VOR 1 Head Only (x 1 viewing) slow horizontal  and vertical   ? VOR Cancellation Corrective saccades   ?  ? Positional Testing  ? Dix-Hallpike Dix-Hallpike Right;Dix-Hallpike Left   ? Sidelying Test Sidelying Right;Sidelying Left   ? Horizontal Canal Testing Horizontal Canal Right;Horizontal Canal Left   ?  ? Dix-Hallpike Right  ? Dix-Hallpike Right Duration 0   ? Dix-Hallpike Right Symptoms No nystagmus   unable to get into necessary cervical extension d/t neck pain- switched to SL test  ?  ? Sidelying Right  ? Sidelying Right Duration ~20 sec   ? Sidelying Right Symptoms Upbeat, right rotatory nystagmus   only visible in R gaze; asymptomatic but c/o lightheadedness upon sitting up  ?  ? Sidelying Left  ? Sidelying Left Duration 0   ? Sidelying Left Symptoms No nystagmus   c/o wooziness upon sitting up  ?  ? Horizontal Canal Right  ? Horizontal Canal Right Duration 0   ? Horizontal Canal Right Symptoms Normal   ?  ? Horizontal Canal Left  ? Horizontal Canal Left Duration 0   ? Horizontal Canal Left Symptoms Normal   ?  ? Orthostatics  ? BP supine (x 5 minutes) 134/86   ? HR supine (x 5 minutes) 72   ? BP sitting 111/82   ? HR sitting 83   ? BP standing (after 1 minute) 122/80   ? HR standing (after 1 minute) 85   ? BP standing (after 3 minutes) 122/82   ? HR standing (after 3 minutes) 87   ? Orthostatics Comment positive in supine>sit   ? ?  ?  ? ?  ? ? ? ? ? ?Objective measurements completed on examination: See above findings.  ? ? ? ? ? ? ? ? ? ? ? ? ? ? PT Education - 07/09/21 1623   ? ? Education Details edu on exam findings and recommendation to F/U with neurology for further work-up, edu on orthostasis and  techniques to address it- advised to speak to PCP if it doesn't improve   ? Person(s) Educated Patient;Spouse   wife  ? Methods Explanation   ? Comprehension Verbalized understanding   ? ?  ?  ? ?  ? ? ? P

## 2021-07-16 NOTE — Telephone Encounter (Signed)
Hello, for his orthostasis, we will have him contact PCP, maybe BP meds need adjustment. What type of further CNS workup did you mean? Thanks ?

## 2021-07-21 ENCOUNTER — Other Ambulatory Visit: Payer: Self-pay | Admitting: Internal Medicine

## 2021-08-06 DIAGNOSIS — N400 Enlarged prostate without lower urinary tract symptoms: Secondary | ICD-10-CM | POA: Diagnosis not present

## 2021-08-06 DIAGNOSIS — G4733 Obstructive sleep apnea (adult) (pediatric): Secondary | ICD-10-CM | POA: Diagnosis not present

## 2021-08-06 DIAGNOSIS — Z7984 Long term (current) use of oral hypoglycemic drugs: Secondary | ICD-10-CM | POA: Diagnosis not present

## 2021-08-06 DIAGNOSIS — R69 Illness, unspecified: Secondary | ICD-10-CM | POA: Diagnosis not present

## 2021-08-06 DIAGNOSIS — I499 Cardiac arrhythmia, unspecified: Secondary | ICD-10-CM | POA: Diagnosis not present

## 2021-08-06 DIAGNOSIS — Z7952 Long term (current) use of systemic steroids: Secondary | ICD-10-CM | POA: Diagnosis not present

## 2021-08-06 DIAGNOSIS — E785 Hyperlipidemia, unspecified: Secondary | ICD-10-CM | POA: Diagnosis not present

## 2021-08-06 DIAGNOSIS — E1142 Type 2 diabetes mellitus with diabetic polyneuropathy: Secondary | ICD-10-CM | POA: Diagnosis not present

## 2021-08-06 DIAGNOSIS — K219 Gastro-esophageal reflux disease without esophagitis: Secondary | ICD-10-CM | POA: Diagnosis not present

## 2021-08-06 DIAGNOSIS — G4700 Insomnia, unspecified: Secondary | ICD-10-CM | POA: Diagnosis not present

## 2021-08-06 DIAGNOSIS — E11620 Type 2 diabetes mellitus with diabetic dermatitis: Secondary | ICD-10-CM | POA: Diagnosis not present

## 2021-08-06 DIAGNOSIS — I10 Essential (primary) hypertension: Secondary | ICD-10-CM | POA: Diagnosis not present

## 2021-08-30 ENCOUNTER — Encounter: Payer: Self-pay | Admitting: Internal Medicine

## 2021-08-30 ENCOUNTER — Ambulatory Visit (INDEPENDENT_AMBULATORY_CARE_PROVIDER_SITE_OTHER): Payer: Medicare HMO | Admitting: Internal Medicine

## 2021-08-30 VITALS — BP 124/74 | HR 77 | Temp 97.6°F | Ht 77.0 in | Wt 246.0 lb

## 2021-08-30 DIAGNOSIS — E1142 Type 2 diabetes mellitus with diabetic polyneuropathy: Secondary | ICD-10-CM | POA: Diagnosis not present

## 2021-08-30 LAB — POCT GLYCOSYLATED HEMOGLOBIN (HGB A1C): Hemoglobin A1C: 7.9 % — AB (ref 4.0–5.6)

## 2021-08-30 NOTE — Assessment & Plan Note (Signed)
Lab Results  Component Value Date   HGBA1C 7.9 (A) 08/30/2021   Much better! This is acceptable Will continue the bydureon '2mg'$  weekly, metformin 1000 bid, glypizide 10 daily, farxiga '10mg'$  No changes needed

## 2021-08-30 NOTE — Progress Notes (Signed)
Subjective:    Patient ID: Alejandro Koch, male    DOB: Feb 03, 1951, 71 y.o.   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 He is excited about this Is playing the stock market---doing well thus far (not day trading)  Did lose the 5# he gained Not cheating much Not much exercise--walks dogs  Sugars have come down some---still highly variable (120-180) Has even gotten a 60--but no symptoms Rare hypoglycemic symptoms (mild)--better if he eats something  Current Outpatient Medications on File Prior to Visit  Medication Sig Dispense Refill   ALPRAZolam (XANAX) 0.5 MG tablet TAKE 1 TABLET BY MOUTH THREE TIMES A DAY AS NEEDED 90 tablet 0   Blood Glucose Monitoring Suppl (ONETOUCH VERIO REFLECT) w/Device KIT USE DAILY AS DIRECTED 1 kit 0   dapagliflozin propanediol (FARXIGA) 10 MG TABS tablet Take 1 tablet (10 mg total) by mouth daily before breakfast. 90 tablet 3   esomeprazole (NEXIUM) 20 MG capsule Take 40 mg by mouth 2 (two) times daily before a meal.     Exenatide ER (BYDUREON BCISE) 2 MG/0.85ML AUIJ INJECT 2 MG INTO THE SKIN ONCE A WEEK. 10.2 mL 11   finasteride (PROSCAR) 5 MG tablet TAKE 1 TABLET BY MOUTH EVERY DAY 90 tablet 3   glipiZIDE (GLUCOTROL XL) 10 MG 24 hr tablet TAKE 1 TABLET (10 MG TOTAL) BY MOUTH DAILY WITH BREAKFAST. 90 tablet 3   glucose blood (ONETOUCH VERIO) test strip Use 1 strip daily to check blood sugar. Dx Code E11.49 50 strip 11   hydrocortisone 2.5 % cream APPLY TOPICALLY 3 TIMES DAILY AS NEEDED. 28 g 3   ketorolac (ACULAR) 0.5 % ophthalmic solution USE AS DIRECTED 5 mL 1   metFORMIN (GLUCOPHAGE-XR) 500 MG 24 hr tablet Take 2 tablets (1,000 mg total) by mouth in the morning and at bedtime. 360 tablet 3   Meth-Hyo-M Bl-Na Phos-Ph Sal (URO-MP) 118 MG CAPS Take 1 capsule by mouth every 6 (six) hours as needed.     olmesartan-hydrochlorothiazide (BENICAR HCT) 20-12.5 MG tablet Take 0.5  tablets by mouth daily. 1 tablet 0   ONETOUCH DELICA LANCETS FINE MISC 1 Units by Does not apply route daily. 100 each 2   rosuvastatin (CRESTOR) 10 MG tablet Take 1 tablet (10 mg total) by mouth daily. 90 tablet 3   tamsulosin (FLOMAX) 0.4 MG CAPS capsule Take 2 capsules (0.8 mg total) by mouth daily. Patient reports taking 2 0.37m capsules. 1 capsule 0   urea (CARMOL) 40 % CREA APPLY PEA SIZED AMOUNT TO HARD AREA BELOW PINKY TOE TWICE A DAY 28.35 g 1   vitamin B-12 (CYANOCOBALAMIN) 1000 MCG tablet Take 1 tablet (1,000 mcg total) by mouth daily.     albuterol (PROVENTIL HFA;VENTOLIN HFA) 108 (90 Base) MCG/ACT inhaler Inhale into the lungs.     nitroGLYCERIN (NITROSTAT) 0.4 MG SL tablet Place under the tongue.     No current facility-administered medications on file prior to visit.    Allergies  Allergen Reactions   Iohexol      Code: HIVES, Desc: PT IS ALLERGIC TO IVP DYE AS NOTED IN PREVIOUS RECORDS IN PACS 02/2009/RM, Onset Date: 125053976   Atorvastatin Other (See Comments)    Myalgias with this and rosuvastin   Metformin And Related Diarrhea    Past Medical History:  Diagnosis Date   Abdominal discomfort, epigastric 06/10/2017   Actinic keratoses 05/12/2014   Acute  chest pain 12/20/2007   Acute prostatitis 06/10/2017   Classic presentation; will treat with sulfa x 2 weeks. He did well with this back in March   Allergic rhinitis 09/30/2006   Anemia    Benign essential hypertension 09/30/2006   BP Readings from Last 3 Encounters: 06/16/17 138/72  06/09/17 118/66  12/09/16 136/82   Reasonable control   Blood transfusion without reported diagnosis    40 yrs ago    BPH with obstruction/lower urinary tract symptoms 12/27/2018   Still with mild symptoms. If worsens, will need to go back to the urologist   CHF (congestive heart failure)    Chronic cough 07/16/2018   Fits definition of chronic bronchitis except not 2 years in a row. Seeing the pulmonary doctor. Doesn't feel the  breo has helped   Extensor tendon laceration of right hand with open wound 09/16/2016   Fatigue 06/10/2017   Fatty liver 03/01/2010   LFTs normal. Still needs more weight loss   GERD (gastroesophageal reflux disease) 09/30/2006   Tried omeprazole and symptoms all came back. Will order the nexium again   Glaucoma    Hyperlipemia 09/30/2006   Inconsistent with the atorvastatin. Discussed---will take it twice a week   Hyperlipidemia    Hypogonadism in male 09/19/2009   Kidney stones    Laryngopharyngeal reflux (LPR) 06/03/2016   Left foot pain 09/20/2019   Major depressive disorder    Mild neurocognitive disorder 05/18/2020   OSA (obstructive sleep apnea) 09/30/2006   NPSG 07/2014:  AHI 23/hr, PLMS 452 with 12/hr with A/A NPSG 07/2014:  AHI 23/hr, PLMS 452 with 12/hr with A/A. The patient has moderate obstructive sleep apnea by his recent study, and is very symptomatic during the day. I have discussed with him the various treatment options, and have recommended a trial of C P   Shortness of breath 05/21/2015   09/11/17 Right and Left Heart cath: Right radial and brachial access.Left dominant coronary system with no CAD RHC with some exercise showed RA 15, 14 mm Hg (mean 12)PA 37/14  mean 27,PWP 17,15 mean 16   Type 2 diabetes mellitus with neurological manifestations, uncontrolled 03/05/2009   HGBA1C 8.7 (A) 03/30/2018. Not much better. I want to send him to endocrine--he wants to hold off (doesn't want insulin, etc) Needs formal help---like Weight Wat[employer] Will give him 3 months more   Vitamin B12 deficiency 06/16/2017   Does take supplement daily. Will check with full labs at next visit    Past Surgical History:  Procedure Laterality Date   CARDIAC CATHETERIZATION     [facility] - myocard perf and echo done as well - all normal- EF 48-55% 2019 April and june 2019   COLONOSCOPY  2008   FINGER SURGERY  2013   infection 2nd finger left hand   POLYPECTOMY     REPAIR FLEXOR TENDON HAND Right  08/2016    Family History  Problem Relation Age of Onset   Alzheimer's disease Mother    Heart disease Father    COPD Father    Colon cancer Father 20   Prostate cancer Father    Dementia Father    Colon polyps Brother    Alzheimer's disease Maternal Grandmother    Leukemia Brother    Leukemia Other    Stomach cancer Neg Hx    Esophageal cancer Neg Hx    Rectal cancer Neg Hx     Social History   Socioeconomic History   Marital status: Married    Spouse  name: Not on file   Number of children: 1   Years of education: 14   Highest education level: Associate degree: academic program  Occupational History   Occupation: Retired    Comment: EMS, Customer service manager  Tobacco Use   Smoking status: Former    Packs/day: 2.50    Years: 7.00    Total pack years: 17.50    Types: Cigarettes    Quit date: 03/18/1983    Years since quitting: 38.4   Smokeless tobacco: Never  Vaping Use   Vaping Use: Never used  Substance and Sexual Activity   Alcohol use: Yes    Comment: occasional glass of wine   Drug use: No   Sexual activity: Not on file  Other Topics Concern   Not on file  Social History Narrative   No living will or health care POA thus far   Wife, then son, should make decisions. May make son the primary person   Would accept resuscitation attempts but wouldn't want to be on machines   No tube feeds if cognitively unaware   Social Determinants of Health   Financial Resource Strain: Not on file  Food Insecurity: Not on file  Transportation Needs: Not on file  Physical Activity: Not on file  Stress: Not on file  Social Connections: Not on file  Intimate Partner Violence: Not on file   Review of Systems Is scheduled for home sleep study His sleep has improved    Objective:   Physical Exam Constitutional:      Appearance: Normal appearance.  Neurological:     Mental Status: He is alert.  Psychiatric:        Mood and Affect: Mood normal.        Behavior: Behavior  normal.            Assessment & Plan:

## 2021-09-06 ENCOUNTER — Ambulatory Visit (HOSPITAL_BASED_OUTPATIENT_CLINIC_OR_DEPARTMENT_OTHER): Payer: Medicare HMO | Attending: Neurology | Admitting: Internal Medicine

## 2021-09-06 ENCOUNTER — Other Ambulatory Visit: Payer: Self-pay | Admitting: Internal Medicine

## 2021-09-06 DIAGNOSIS — G4733 Obstructive sleep apnea (adult) (pediatric): Secondary | ICD-10-CM | POA: Insufficient documentation

## 2021-09-11 ENCOUNTER — Telehealth: Payer: Self-pay

## 2021-09-11 DIAGNOSIS — G4730 Sleep apnea, unspecified: Secondary | ICD-10-CM

## 2021-09-11 DIAGNOSIS — G4733 Obstructive sleep apnea (adult) (pediatric): Secondary | ICD-10-CM | POA: Diagnosis not present

## 2021-09-11 NOTE — Telephone Encounter (Signed)
Pt called an informed that sleep study showed severe sleep apnea. Recommend referral to sleep specialist. Referral placed in epic

## 2021-09-11 NOTE — Telephone Encounter (Signed)
-----   Message from Cameron Sprang, MD sent at 09/11/2021  2:33 PM EDT ----- Regarding: sleep study results Pls let patient and wife know the sleep study showed severe sleep apnea. Recommend referral to sleep specialist. Pls refer to Pulmonary, dx: severe OSA.  Thanks  ----- Message ----- From: Deneise Lever, MD Sent: 09/11/2021   1:32 PM EDT To: Cameron Sprang, MD

## 2021-09-11 NOTE — Procedures (Signed)
    Patient Name: Winford, Hehn Date: 09/08/2021 Gender: Male D.O.B: [birth year] Age (years): 69 Referring Provider: Cameron Sprang Height (inches): 59 Interpreting Physician: Baird Lyons MD, ABSM Weight (lbs): 250 RPSGT: Jacolyn Reedy BMI: 30 MRN: [id number] Neck Size: 16.25  CLINICAL INFORMATION Sleep Study Type: HST Indication for sleep study: Insomnia with OSA Epworth Sleepiness Score: 4  SLEEP STUDY TECHNIQUE A multi-channel overnight portable sleep study was performed. The channels recorded were: nasal airflow, thoracic respiratory movement, and oxygen saturation with a pulse oximetry. Snoring was also monitored.  MEDICATIONS Patient self administered medications include: none reported.  SLEEP ARCHITECTURE Patient was studied for 369.5 minutes. The sleep efficiency was 100.0 % and the patient was supine for 0%. The arousal index was 0.0 per hour.  RESPIRATORY PARAMETERS The overall AHI was 37.7 per hour, with a central apnea index of 0 per hour. The oxygen nadir was 87% during sleep.  CARDIAC DATA Mean heart rate during sleep was 82.4 bpm.  IMPRESSIONS - Severe obstructive sleep apnea occurred during this study (AHI = 37.7/h). - Mild oxygen desaturation was noted during this study (Min O2 = 87%). Mean 96% - Patient snored.  DIAGNOSIS - Obstructive Sleep Apnea (G47.33)  RECOMMENDATIONS - Suggest CPAP titration sleep study or autopap. Other options wouldf be based on clinical judgment. - Be careful with alcohol, sedatives and other CNS depressants that may worsen sleep apnea and disrupt normal sleep architecture. - Sleep hygiene should be reviewed to assess factors that may improve sleep quality. - Weight management and regular exercise should be initiated or continued.  [Electronically signed] 09/11/2021 01:30 PM  Baird Lyons MD, [place], American Board of Sleep Medicine NPI: [id number]                          [place]  ELECTRONICALLY SIGNED ON:  09/11/2021, 1:28 PM [place] PH: [phone]   FX: [phone] [place]

## 2021-09-22 ENCOUNTER — Other Ambulatory Visit: Payer: Self-pay | Admitting: Internal Medicine

## 2021-10-16 DIAGNOSIS — R0602 Shortness of breath: Secondary | ICD-10-CM | POA: Diagnosis not present

## 2021-11-06 ENCOUNTER — Encounter: Payer: Self-pay | Admitting: Internal Medicine

## 2021-11-06 ENCOUNTER — Ambulatory Visit (INDEPENDENT_AMBULATORY_CARE_PROVIDER_SITE_OTHER): Payer: Medicare HMO | Admitting: Internal Medicine

## 2021-11-06 DIAGNOSIS — L989 Disorder of the skin and subcutaneous tissue, unspecified: Secondary | ICD-10-CM

## 2021-11-06 HISTORY — DX: Disorder of the skin and subcutaneous tissue, unspecified: L98.9

## 2021-11-06 LAB — POC URINALSYSI DIPSTICK (AUTOMATED)
Blood, UA: NEGATIVE
Glucose, UA: POSITIVE — AB
Ketones, UA: NEGATIVE
Leukocytes, UA: NEGATIVE
Nitrite, UA: NEGATIVE
Protein, UA: NEGATIVE
Spec Grav, UA: 1.015 (ref 1.010–1.025)
Urobilinogen, UA: 0.2 E.U./dL
pH, UA: 5.5 (ref 5.0–8.0)

## 2021-11-06 MED ORDER — DOXYCYCLINE HYCLATE 100 MG PO TABS: 100.0000 mg | ORAL_TABLET | Freq: Two times a day (BID) | ORAL | 1 refills | Status: DC

## 2021-11-06 NOTE — Progress Notes (Signed)
Subjective:    Patient ID: Alejandro Koch, male    DOB: 09/28/1950, 71 y.o.   MRN: [id number]  HPI Here due to a lump in his groin  Noticed it a few days ago--when drying off after shower Like a pimple--between penis and testicle Wondered if he had infected hair follicle It has grown Some tenderness  Current Outpatient Medications on File Prior to Visit  Medication Sig Dispense Refill   ALPRAZolam (XANAX) 0.5 MG tablet TAKE 1 TABLET BY MOUTH THREE TIMES A DAY AS NEEDED 90 tablet 0   Blood Glucose Calibration (ONETOUCH VERIO) SOLN AS DIRECTED 1 each 1   Blood Glucose Monitoring Suppl (ONETOUCH VERIO FLEX SYSTEM) w/Device KIT Use as directed,once daily to test blood sugar 1 kit 0   dapagliflozin propanediol (FARXIGA) 10 MG TABS tablet Take 1 tablet (10 mg total) by mouth daily before breakfast. 90 tablet 3   esomeprazole (NEXIUM) 20 MG capsule Take 40 mg by mouth 2 (two) times daily before a meal.     Exenatide ER (BYDUREON BCISE) 2 MG/0.85ML AUIJ INJECT 2 MG INTO THE SKIN ONCE A WEEK. 10.2 mL 11   finasteride (PROSCAR) 5 MG tablet TAKE 1 TABLET BY MOUTH EVERY DAY 90 tablet 3   glipiZIDE (GLUCOTROL XL) 10 MG 24 hr tablet TAKE 1 TABLET (10 MG TOTAL) BY MOUTH DAILY WITH BREAKFAST. 90 tablet 3   glucose blood (ONETOUCH VERIO) test strip Use 1 strip daily to check blood sugar. Dx Code E11.49 50 strip 11   hydrocortisone 2.5 % cream APPLY TOPICALLY 3 TIMES DAILY AS NEEDED. 28 g 3   ketorolac (ACULAR) 0.5 % ophthalmic solution USE AS DIRECTED 5 mL 1   metFORMIN (GLUCOPHAGE-XR) 500 MG 24 hr tablet Take 2 tablets (1,000 mg total) by mouth in the morning and at bedtime. 360 tablet 3   Meth-Hyo-M Bl-Na Phos-Ph Sal (URO-MP) 118 MG CAPS Take 1 capsule by mouth every 6 (six) hours as needed.     olmesartan-hydrochlorothiazide (BENICAR HCT) 20-12.5 MG tablet Take 0.5 tablets by mouth daily. 1 tablet 0   ONETOUCH DELICA LANCETS FINE MISC 1 Units by Does not apply route daily. 100 each 2   rosuvastatin  (CRESTOR) 10 MG tablet Take 1 tablet (10 mg total) by mouth daily. 90 tablet 3   tamsulosin (FLOMAX) 0.4 MG CAPS capsule Take 2 capsules (0.8 mg total) by mouth daily. Patient reports taking 2 0.29m capsules. 1 capsule 0   urea (CARMOL) 40 % CREA APPLY PEA SIZED AMOUNT TO HARD AREA BELOW PINKY TOE TWICE A DAY 28.35 g 1   vitamin B-12 (CYANOCOBALAMIN) 1000 MCG tablet Take 1 tablet (1,000 mcg total) by mouth daily.     albuterol (PROVENTIL HFA;VENTOLIN HFA) 108 (90 Base) MCG/ACT inhaler Inhale into the lungs.     nitroGLYCERIN (NITROSTAT) 0.4 MG SL tablet Place under the tongue.     No current facility-administered medications on file prior to visit.    Allergies  Allergen Reactions   Iohexol      Code: HIVES, Desc: PT IS ALLERGIC TO IVP DYE AS NOTED IN PREVIOUS RECORDS IN PACS 02/2009/RM, Onset Date: 135329924   Atorvastatin Other (See Comments)    Myalgias with this and rosuvastin   Metformin And Related Diarrhea    Past Medical History:  Diagnosis Date   Abdominal discomfort, epigastric 06/10/2017   Actinic keratoses 05/12/2014   Acute chest pain 12/20/2007   Acute prostatitis 06/10/2017   Classic presentation; will treat with sulfa x  2 weeks. He did well with this back in March   Allergic rhinitis 09/30/2006   Anemia    Benign essential hypertension 09/30/2006   BP Readings from Last 3 Encounters: 06/16/17 138/72  06/09/17 118/66  12/09/16 136/82   Reasonable control   Blood transfusion without reported diagnosis    40 yrs ago    BPH with obstruction/lower urinary tract symptoms 12/27/2018   Still with mild symptoms. If worsens, will need to go back to the urologist   CHF (congestive heart failure)    Chronic cough 07/16/2018   Fits definition of chronic bronchitis except not 2 years in a row. Seeing the pulmonary doctor. Doesn't feel the breo has helped   Extensor tendon laceration of right hand with open wound 09/16/2016   Fatigue 06/10/2017   Fatty liver 03/01/2010    LFTs normal. Still needs more weight loss   GERD (gastroesophageal reflux disease) 09/30/2006   Tried omeprazole and symptoms all came back. Will order the nexium again   Glaucoma    Hyperlipemia 09/30/2006   Inconsistent with the atorvastatin. Discussed---will take it twice a week   Hyperlipidemia    Hypogonadism in male 09/19/2009   Kidney stones    Laryngopharyngeal reflux (LPR) 06/03/2016   Left foot pain 09/20/2019   Major depressive disorder    Mild neurocognitive disorder 05/18/2020   OSA (obstructive sleep apnea) 09/30/2006   NPSG 07/2014:  AHI 23/hr, PLMS 452 with 12/hr with A/A NPSG 07/2014:  AHI 23/hr, PLMS 452 with 12/hr with A/A. The patient has moderate obstructive sleep apnea by his recent study, and is very symptomatic during the day. I have discussed with him the various treatment options, and have recommended a trial of C P   Shortness of breath 05/21/2015   09/11/17 Right and Left Heart cath: Right radial and brachial access.Left dominant coronary system with no CAD RHC with some exercise showed RA 15, 14 mm Hg (mean 12)PA 37/14  mean 27,PWP 17,15 mean 16   Type 2 diabetes mellitus with neurological manifestations, uncontrolled 03/05/2009   HGBA1C 8.7 (A) 03/30/2018. Not much better. I want to send him to endocrine--he wants to hold off (doesn't want insulin, etc) Needs formal help---like Weight Watchers Will give him 3 months more   Vitamin B12 deficiency 06/16/2017   Does take supplement daily. Will check with full labs at next visit    Past Surgical History:  Procedure Laterality Date   CARDIAC CATHETERIZATION     [facility] - myocard perf and echo done as well - all normal- EF 48-55% 2019 April and june 2019   COLONOSCOPY  2008   FINGER SURGERY  2013   infection 2nd finger left hand   POLYPECTOMY     REPAIR FLEXOR TENDON HAND Right 08/2016    Family History  Problem Relation Age of Onset   Alzheimer's disease Mother    Heart disease Father    COPD Father    Colon  cancer Father 63   Prostate cancer Father    Dementia Father    Colon polyps Brother    Alzheimer's disease Maternal Grandmother    Leukemia Brother    Leukemia Other    Stomach cancer Neg Hx    Esophageal cancer Neg Hx    Rectal cancer Neg Hx     Social History   Socioeconomic History   Marital status: Married    Spouse name: Not on file   Number of children: 1   Years of education: 4  Highest education level: Associate degree: academic program  Occupational History   Occupation: Retired    Comment: EMS, Customer service manager  Tobacco Use   Smoking status: Former    Packs/day: 2.50    Years: 7.00    Total pack years: 17.50    Types: Cigarettes    Quit date: 03/18/1983    Years since quitting: 38.6    Passive exposure: Past   Smokeless tobacco: Never  Vaping Use   Vaping Use: Never used  Substance and Sexual Activity   Alcohol use: Yes    Comment: occasional glass of wine   Drug use: No   Sexual activity: Not on file  Other Topics Concern   Not on file  Social History Narrative   No living will or health care POA thus far   Wife, then son, should make decisions. May make son the primary person   Would accept resuscitation attempts but wouldn't want to be on machines   No tube feeds if cognitively unaware   Social Determinants of Health   Financial Resource Strain: Not on file  Food Insecurity: Not on file  Transportation Needs: Not on file  Physical Activity: Not on file  Stress: Not on file  Social Connections: Not on file  Intimate Partner Violence: Not on file   Review of Systems Not sick No trouble voiding Sugars 140-150 most mornings     Objective:   Physical Exam Constitutional:      Appearance: Normal appearance.  Genitourinary:    Testes: Normal.     Comments: Nothing in scrotum  Raised mostly circular skin lesion at base of ventral penis Red with slight central ulcer Mildly tender Neurological:     Mental Status: He is alert.             Assessment & Plan:

## 2021-11-06 NOTE — Addendum Note (Signed)
Addended by: Pilar Grammes on: 11/06/2021 09:37 AM   Modules accepted: Orders

## 2021-11-06 NOTE — Patient Instructions (Signed)
Try warm compresses on the spot and take the antibiotic. If it is no better by Monday, let me know and I will set you up with a urologist

## 2021-11-06 NOTE — Assessment & Plan Note (Signed)
Suspicious for carcinoma--but growing so quick it is more likely infectious (and it is mildly inflamed and tender) Urinalysis negative except for glycosuria  Will treat with doxycycline 100 bid x 7 days Warm compresses Due to location, will set up with urology if not better by next week

## 2021-11-07 ENCOUNTER — Encounter: Payer: Self-pay | Admitting: Internal Medicine

## 2021-11-13 ENCOUNTER — Other Ambulatory Visit: Payer: Self-pay | Admitting: Internal Medicine

## 2021-11-18 ENCOUNTER — Other Ambulatory Visit: Payer: Self-pay | Admitting: Internal Medicine

## 2021-12-23 ENCOUNTER — Other Ambulatory Visit: Payer: Self-pay | Admitting: Internal Medicine

## 2021-12-27 ENCOUNTER — Other Ambulatory Visit: Payer: Self-pay | Admitting: Internal Medicine

## 2022-01-14 NOTE — Telephone Encounter (Signed)
This encounter was created in error - please disregard.

## 2022-01-15 ENCOUNTER — Ambulatory Visit: Payer: Medicare HMO | Admitting: Neurology

## 2022-01-15 ENCOUNTER — Encounter: Payer: Self-pay | Admitting: Neurology

## 2022-01-15 VITALS — BP 111/76 | HR 88 | Ht 77.0 in | Wt 245.4 lb

## 2022-01-15 DIAGNOSIS — G3184 Mild cognitive impairment, so stated: Secondary | ICD-10-CM

## 2022-01-15 DIAGNOSIS — G4733 Obstructive sleep apnea (adult) (pediatric): Secondary | ICD-10-CM

## 2022-01-15 DIAGNOSIS — R42 Dizziness and giddiness: Secondary | ICD-10-CM | POA: Diagnosis not present

## 2022-01-15 NOTE — Patient Instructions (Signed)
Good to see you.  Referral will be sent to the Sleep Specialist  2. Schedule MRI brain without contrast, MRA head without contrast, MRA neck with and without contrast  3. Start daily aspirin '81mg'$   4. Continue control of blood pressure, cholesterol, sugar levels  5. Follow-up in 6 months, call for any changes

## 2022-01-15 NOTE — Progress Notes (Signed)
NEUROLOGY FOLLOW UP OFFICE NOTE  Alejandro Koch [id number] 30-Sep-1950  HISTORY OF PRESENT ILLNESS: I had the pleasure of seeing Alejandro Koch in follow-up in the neurology clinic on 01/15/2022.  The patient was last seen 7 months ago for memory loss and dizziness. He is again accompanied by his wife who helps supplement the history today.  Records and images were personally reviewed where available. Neuropsychological testing in 05/2020 with a diagnosis of Mild Neurocognitive Disorder, etiology unclear. Findings were not consistent with Alzheimer's disease, most likely culprit felt to be a combination of sleep dysfunction, Xanax side effects, and cardiovascular disease, various psychosocial stressors. He had a sleep study done  08/2021 which showed severe OSA. He was referred to Sleep Medicine, his wife reminds him he got a call to schedule an appointment but forgot to call back. He thinks his memory is about the same. His wife feels worsening is a little more noticeable, he gets focused on one thing then goes to another thing really fast. It is more of a focusing issue. He denies getting lost driving, he uses his GPS frequently. His wife fixes his pillbox and he remembers to take them by himself most of the time, sometimes needing reminders if he gets busy. His wife manages finances. He notices he is not as sharp as he used to be, it is getting harder to do his hobby with the stock market. He feels frustrated, his wife notes he gets agitated/irritated more easily, usually in the afternoons. No paranoia or hallucinations.  He continues to have dizzy spells that occur while sitting or standing. He describes spinning and lightheadedness, more when he is looking up or turns his head back. The other day, he was reaching up in the closet and started getting dizzy/faint/lightheaded, he leaned and slid down the wall. There were two instances where he had double vision and had to close one eye to help him stabilize.  Symptoms last a few minutes. No neck pain. He had Vestibular therapy in April 2023 with oculomotor exam showing subtle L beating nystagmus with L gaze and R beating nystagmus with R gaze, saccades with smooth pursuits, undershooting with L saccades, corrective saccades with VOR cancellation. Positional testing was negative, orthostatics positive with supine to sit (134/86 to 111/82). He was advised to follow back up with Neurology for the oculomotor findings, and with PCP for orthostasis. His wife reports they check his BP and glucose soon after these episodes and they would be normal. Glucose levels are better with medication changes, now usually in the 120s.    History on Initial Assessment 05/15/2020: This is a 71 year old right-handed man with a history of hypertension, hyperlipidemia, OSA, diabetes, anxiety, depression, presenting for evaluation of memory loss. He feels hie memory is not like it's supposed to be, he used to be pretty sharp and great with numbers. He lives with his wife. His wife started noticing changes about a year ago, he started having mood changes, a lot of edgy when he is usually even-keel. He was losing things frequently and appetite was not like it was. He was losing weight and reporting dizziness. He would repeat himself and get frustrated easily. She noticed his attention span would not be longer than 10-15 minutes, he would get easily distracted. 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 He has occasional word-finding difficulties when he used to be pretty accurate. He denies getting lost driving but has trouble remembering directions to  the beach and questions himself more now. He denies missing medications or bill payments. His mother had Alzheimer's disease, his father had memory issues. He rarely drinks alcohol. He had a fall last Christmas where he lost consciousness. No neurosurgical procedures. He thinks his mood is better, he was in a  very stressful work environment but did notice he used to be more tolerant but is now more irritable. His wife denies any paranoia or hallucinations.  He states he never had headaches in the past, but over the last 3 months he has had a low grade frontal headache that would occur infrequently. No associated nausea/vomiting. He has dizziness when sitting but more when standing. He describes a spinning sensation where he has to hold on for a few seconds to get his bearings, no falls. He denies any diplopia, dysarthria/dysphagia, neck/back pain, focal numbness/tingling/weakness, bowel/bladder dysfunction, anosmia, or tremors. Sleep is good with Xanax every night.   Diagnostic Data: MRI brain without contrast done 02/2020 did not show any acute changes, there was mild diffuse atrophy.  Neuropsychological testing in 05/2020: diagnosis of Mild Neurocognitive Disorder, etiology unclear. Findings were not consistent with Alzheimer's disease, most likely culprit felt to be a combination of sleep dysfunction, Xanax side effects, and cardiovascular disease, various psychosocial stressors.  Laboratory Data: Lab Results  Component Value Date   TSH 2.24 06/09/2017   Lab Results  Component Value Date   [id number] 08/29/2020   PAST MEDICAL HISTORY: Past Medical History:  Diagnosis Date   Abdominal discomfort, epigastric 06/10/2017   Actinic keratoses 05/12/2014   Acute chest pain 12/20/2007   Acute prostatitis 06/10/2017   Classic presentation; will treat with sulfa x 2 weeks. He did well with this back in March   Allergic rhinitis 09/30/2006   Anemia    Benign essential hypertension 09/30/2006   BP Readings from Last 3 Encounters: 06/16/17 138/72  06/09/17 118/66  12/09/16 136/82   Reasonable control   Blood transfusion without reported diagnosis    40 yrs ago    BPH with obstruction/lower urinary tract symptoms 12/27/2018   Still with mild symptoms. If worsens, will need to go back to the  urologist   CHF (congestive heart failure)    Chronic cough 07/16/2018   Fits definition of chronic bronchitis except not 2 years in a row. Seeing the pulmonary doctor. Doesn't feel the breo has helped   Extensor tendon laceration of right hand with open wound 09/16/2016   Fatigue 06/10/2017   Fatty liver 03/01/2010   LFTs normal. Still needs more weight loss   GERD (gastroesophageal reflux disease) 09/30/2006   Tried omeprazole and symptoms all came back. Will order the nexium again   Glaucoma    Hyperlipemia 09/30/2006   Inconsistent with the atorvastatin. Discussed---will take it twice a week   Hyperlipidemia    Hypogonadism in male 09/19/2009   Kidney stones    Laryngopharyngeal reflux (LPR) 06/03/2016   Left foot pain 09/20/2019   Major depressive disorder    Mild neurocognitive disorder 05/18/2020   OSA (obstructive sleep apnea) 09/30/2006   NPSG 07/2014:  AHI 23/hr, PLMS 452 with 12/hr with A/A NPSG 07/2014:  AHI 23/hr, PLMS 452 with 12/hr with A/A. The patient has moderate obstructive sleep apnea by his recent study, and is very symptomatic during the day. I have discussed with him the various treatment options, and have recommended a trial of C P   Shortness of breath 05/21/2015   09/11/17 Right and Left Heart cath: Right  radial and brachial access.Left dominant coronary system with no CAD RHC with some exercise showed RA 15, 14 mm Hg (mean 12)PA 37/14  mean 27,PWP 17,15 mean 16   Type 2 diabetes mellitus with neurological manifestations, uncontrolled 03/05/2009   HGBA1C 8.7 (A) 03/30/2018. Not much better. I want to send him to endocrine--he wants to hold off (doesn't want insulin, etc) Needs formal help---like Weight [employer] Will give him 3 months more   Vitamin B12 deficiency 06/16/2017   Does take supplement daily. Will check with full labs at next visit    MEDICATIONS: Current Outpatient Medications on File Prior to Visit  Medication Sig Dispense Refill   albuterol  (PROVENTIL HFA;VENTOLIN HFA) 108 (90 Base) MCG/ACT inhaler Inhale into the lungs.     ALPRAZolam (XANAX) 0.5 MG tablet TAKE 1 TABLET BY MOUTH THREE TIMES A DAY AS NEEDED 90 tablet 0   Blood Glucose Calibration (ONETOUCH VERIO) SOLN AS DIRECTED 1 each 1   Blood Glucose Monitoring Suppl (ONETOUCH VERIO FLEX SYSTEM) w/Device KIT Use as directed,once daily to test blood sugar 1 kit 0   BYDUREON BCISE 2 MG/0.85ML AUIJ INJECT 2 MG INTO THE SKIN ONCE A WEEK. 10.2 mL 11   dapagliflozin propanediol (FARXIGA) 10 MG TABS tablet Take 1 tablet (10 mg total) by mouth daily before breakfast. 90 tablet 3   esomeprazole (NEXIUM) 20 MG capsule Take 40 mg by mouth 2 (two) times daily before a meal.     finasteride (PROSCAR) 5 MG tablet TAKE 1 TABLET BY MOUTH EVERY DAY (Patient taking differently: Take 10 mg by mouth daily.) 90 tablet 3   glipiZIDE (GLUCOTROL XL) 10 MG 24 hr tablet TAKE 1 TABLET (10 MG TOTAL) BY MOUTH DAILY WITH BREAKFAST. 90 tablet 3   glucose blood (ONETOUCH VERIO) test strip CHECK BLOOD SUGAR EVERY DAY. E11.42 50 strip 11   hydrocortisone 2.5 % cream APPLY TOPICALLY 3 TIMES DAILY AS NEEDED. 28 g 3   ketorolac (ACULAR) 0.5 % ophthalmic solution USE AS DIRECTED 5 mL 1   metFORMIN (GLUCOPHAGE-XR) 500 MG 24 hr tablet Take 2 tablets (1,000 mg total) by mouth in the morning and at bedtime. 360 tablet 3   Meth-Hyo-M Bl-Na Phos-Ph Sal (URO-MP) 118 MG CAPS Take 1 capsule by mouth every 6 (six) hours as needed.     nitroGLYCERIN (NITROSTAT) 0.4 MG SL tablet Place under the tongue.     olmesartan-hydrochlorothiazide (BENICAR HCT) 20-12.5 MG tablet Take 0.5 tablets by mouth daily. 1 tablet 0   ONETOUCH DELICA LANCETS FINE MISC 1 Units by Does not apply route daily. 100 each 2   rosuvastatin (CRESTOR) 10 MG tablet Take 1 tablet (10 mg total) by mouth daily. 90 tablet 3   tamsulosin (FLOMAX) 0.4 MG CAPS capsule Take 2 capsules (0.8 mg total) by mouth daily. Patient reports taking 2 0.34m capsules. 1 capsule 0    urea (CARMOL) 40 % CREA APPLY PEA SIZED AMOUNT TO HARD AREA BELOW PINKY TOE TWICE A DAY 28.35 g 1   vitamin B-12 (CYANOCOBALAMIN) 1000 MCG tablet Take 1 tablet (1,000 mcg total) by mouth daily.     No current facility-administered medications on file prior to visit.    ALLERGIES: Allergies  Allergen Reactions   Iohexol      Code: HIVES, Desc: PT IS ALLERGIC TO IVP DYE AS NOTED IN PREVIOUS RECORDS IN PACS 02/2009/RM, Onset Date: [id number]   Atorvastatin Other (See Comments)    Myalgias with this and rosuvastin    FAMILY HISTORY:  Family History  Problem Relation Age of Onset   Alzheimer's disease Mother    Heart disease Father    COPD Father    Colon cancer Father 4   Prostate cancer Father    Dementia Father    Colon polyps Brother    Alzheimer's disease Maternal Grandmother    Leukemia Brother    Leukemia Other    Stomach cancer Neg Hx    Esophageal cancer Neg Hx    Rectal cancer Neg Hx     SOCIAL HISTORY: Social History   Socioeconomic History   Marital status: Married    Spouse name: Not on file   Number of children: 1   Years of education: 14   Highest education level: Associate degree: academic program  Occupational History   Occupation: Retired    Comment: EMS, Customer service manager  Tobacco Use   Smoking status: Former    Packs/day: 2.50    Years: 7.00    Total pack years: 17.50    Types: Cigarettes    Quit date: 03/18/1983    Years since quitting: 38.8    Passive exposure: Past   Smokeless tobacco: Never  Vaping Use   Vaping Use: Never used  Substance and Sexual Activity   Alcohol use: Not Currently    Comment: occasional glass of wine   Drug use: No   Sexual activity: Not on file  Other Topics Concern   Not on file  Social History Narrative   No living will or health care POA thus far   Wife, then son, should make decisions. May make son the primary person   Would accept resuscitation attempts but wouldn't want to be on machines   No tube feeds if  cognitively unaware   Social Determinants of Health   Financial Resource Strain: Not on file  Food Insecurity: Not on file  Transportation Needs: Not on file  Physical Activity: Not on file  Stress: Not on file  Social Connections: Not on file  Intimate Partner Violence: Not on file     PHYSICAL EXAM: Vitals:   01/15/22 0820  BP: 111/76  Pulse: 88  SpO2: 96%   General: No acute distress Head:  Normocephalic/atraumatic Skin/Extremities: No rash, no edema Neurological Exam: alert and awake. No aphasia or dysarthria. Fund of knowledge is appropriate.  Attention and concentration are normal.   Cranial nerves: Pupils equal, round. Extraocular movements intact with no nystagmus seen today. Visual fields full.  No facial asymmetry.  Motor: Bulk and tone normal, muscle strength 5/5 throughout with no pronator drift.   Finger to nose testing intact.  Gait narrow-based and steady, able to tandem walk adequately.  Romberg slight sway. No tremor.   IMPRESSION: This is a 71 yo RH man with a history of hypertension, hyperlipidemia, OSA, diabetes, anxiety, depression, with Mild Neurocognitive disorder, etiology unclear. MRI brain in 2021 no acute changes, there is mild diffuse atrophy, no significant chronic microvascular disease. Cognitive profile on testing was not consistent with AD and most likely culprit felt due to combination of sleep dysfunction, Xanax side effects, cardiovascular disease, and various psychosocial stressors. Sleep study in June 2023 showed severe sleep apnea but he did not schedule appointment with Sleep Medicine, will send referral again. We discussed how uncontrolled severe OSA can contribute to cognitive changes. He continues to report episodic dizziness, he has had double vision with some of them. There was note of oculomotor changes when he did physical therapy, concerning for central pathology. MRI brain without  contrast will be ordered to assess for underlying structural  abnormality. MRA head and neck will be ordered to assess for vertebrobasilar insufficiency. He was advised to start a daily baby aspirin, continue control of vascular risk factors. Follow-up in 6 months, call for any changes.    Thank you for allowing me to participate in his care.  Please do not hesitate to call for any questions or concerns.    Ellouise Newer, M.D.   CC: Dr. [name]

## 2022-01-24 ENCOUNTER — Institutional Professional Consult (permissible substitution): Payer: Medicare HMO | Admitting: Nurse Practitioner

## 2022-01-24 ENCOUNTER — Telehealth: Payer: Self-pay | Admitting: Neurology

## 2022-01-24 NOTE — Telephone Encounter (Signed)
Pt wife called back no answer left a voice mail to call the office back

## 2022-01-24 NOTE — Telephone Encounter (Signed)
Pt wife called in she was advised that with him having new symptoms that she should take him to the ER for an evaluation, they can do the MRI there and make sure that he is not having a stroke. She stated that she thought she needed to take him or if we could move up his MRI, she stated hearing she should take him to the ER is what she needed to hear.

## 2022-01-24 NOTE — Telephone Encounter (Signed)
Patient's wife called and said the patient has been experiencing double vision and a funny feeling in the top of his head, this is a new sensation.  She wants to be sure she should not be concerned he may be having a stroke.

## 2022-01-26 ENCOUNTER — Other Ambulatory Visit: Payer: Self-pay | Admitting: Internal Medicine

## 2022-01-29 ENCOUNTER — Ambulatory Visit (INDEPENDENT_AMBULATORY_CARE_PROVIDER_SITE_OTHER): Payer: Medicare HMO | Admitting: Nurse Practitioner

## 2022-01-29 ENCOUNTER — Encounter: Payer: Self-pay | Admitting: Nurse Practitioner

## 2022-01-29 VITALS — BP 100/68 | HR 95 | Ht 77.0 in | Wt 246.5 lb

## 2022-01-29 DIAGNOSIS — G4733 Obstructive sleep apnea (adult) (pediatric): Secondary | ICD-10-CM

## 2022-01-29 NOTE — Progress Notes (Signed)
Reviewed and agree with assessment/plan.   Chesley Mires, MD [facility] Pulmonary/Critical Care 01/29/2022, 1:24 PM Pager:  [phone]

## 2022-01-29 NOTE — Progress Notes (Signed)
_0  ID: [name], male    DOB: 26-Jan-1951, 71 y.o.   MRN: [id number]  Chief Complaint  Patient presents with   Consult    Pt sleep consult; he had a sleep study 6/27. Currently not doing any therapy. PT was experiencing memory loss and Dr.Aquino done a HST and placed referral to our clinic.     Referring provider: Cameron Sprang, MD  HPI: 71 year old male, former smoker referred for sleep consult. Past medical history significant for HTN, allergic rhinitis, GERD, DM II, BPH, HLD, MDD.  TEST/EVENTS:  07/2014 NPSG: AHI 23/h 09/06/2021 HST: AHI 37.7/h, SpO2 low 87%  01/29/2022: Today - sleep consult Patient presents today for sleep consult, referred by Dr. Delice Lesch. He has been having issues with memory impairment felt to be related to sleep dysfunction. He has a history of OSA and was on CPAP around 30 years ago but was unable to tolerate it so his provider at the time told him he could stop after he lost a significant amount of weight. His neurologist ordered a repeat sleep study, which he completed on 09/06/2021. This revealed severe OSA with AHI 37.7. He was not restarted on CPAP therapy yet. Today, he tells me that he has been struggling with nocturia and memory loss over the past few years. He doesn't really have any significant daytime fatigue symptoms. His wife doesn't noticed much snoring or any witnessed apneas. He does wake up with a dry mouth. Denies morning headaches, drowsy driving, sleep parasomnias/paralysis, No history of narcolepsy or cataplexy.  He goes to bed around 11 PM.  Usually falls asleep relatively easily.  Wakes 2-3 times a night to use the restroom.  Officially gets up around 6 to 7 AM. He is down 20 lb over the last 2 years; 75 lb since his previous sleep study years ago. 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  He has a history of hypertension and diabetes, currently controlled. No history of stroke. Spinal fusion in 1968. Former smoker;  quit 1980's with 17.5 pack years. Lives at home with his wife. Retired from Personal assistant. Family history of emphysema, heart disease and cancer  Epworth 4  Allergies  Allergen Reactions   Iohexol      Code: HIVES, Desc: PT IS ALLERGIC TO IVP DYE AS NOTED IN PREVIOUS RECORDS IN PACS 02/2009/RM, Onset Date: [id number]    Atorvastatin Other (See Comments)    Myalgias with this and rosuvastin    Immunization History  Administered Date(s) Administered   Fluad Quad(high Dose 65+) 01/07/2020, 03/01/2021   Hep A / Hep B 10/05/2013   Influenza Split 12/09/2010, 12/03/2011   Influenza Whole 12/25/2008, 12/10/2009   Influenza, Seasonal, Injecte, Preservative Fre 12/16/2014, 12/16/2015   Influenza,inj,Quad PF,6+ Mos 12/06/2012, 12/06/2013, 12/09/2016, 12/23/2017   PFIZER(Purple Top)SARS-COV-2 Vaccination 05/10/2019, 05/31/2019   Pneumococcal Conjugate-13 05/21/2015   Pneumococcal Polysaccharide-23 10/05/2013   Td 01/02/2001   Tdap 09/09/2010, 09/07/2016   Zoster Recombinat (Shingrix) 01/18/2019, 07/20/2019   Zoster, Live 12/06/2012    Past Medical History:  Diagnosis Date   Abdominal discomfort, epigastric 06/10/2017   Actinic keratoses 05/12/2014   Acute chest pain 12/20/2007   Acute prostatitis 06/10/2017   Classic presentation; will treat with sulfa x 2 weeks. He did well with this back in March   Allergic rhinitis 09/30/2006   Anemia    Benign essential hypertension 09/30/2006   BP Readings from Last 3 Encounters: 06/16/17 138/72  06/09/17 118/66  12/09/16 136/82  Reasonable control   Blood transfusion without reported diagnosis    40 yrs ago    BPH with obstruction/lower urinary tract symptoms 12/27/2018   Still with mild symptoms. If worsens, will need to go back to the urologist   CHF (congestive heart failure)    Chronic cough 07/16/2018   Fits definition of chronic bronchitis except not 2 years in a row. Seeing the pulmonary doctor. Doesn't feel the breo has helped    Extensor tendon laceration of right hand with open wound 09/16/2016   Fatigue 06/10/2017   Fatty liver 03/01/2010   LFTs normal. Still needs more weight loss   GERD (gastroesophageal reflux disease) 09/30/2006   Tried omeprazole and symptoms all came back. Will order the nexium again   Glaucoma    Hyperlipemia 09/30/2006   Inconsistent with the atorvastatin. Discussed---will take it twice a week   Hyperlipidemia    Hypogonadism in male 09/19/2009   Kidney stones    Laryngopharyngeal reflux (LPR) 06/03/2016   Left foot pain 09/20/2019   Major depressive disorder    Mild neurocognitive disorder 05/18/2020   OSA (obstructive sleep apnea) 09/30/2006   NPSG 07/2014:  AHI 23/hr, PLMS 452 with 12/hr with A/A NPSG 07/2014:  AHI 23/hr, PLMS 452 with 12/hr with A/A. The patient has moderate obstructive sleep apnea by his recent study, and is very symptomatic during the day. I have discussed with him the various treatment options, and have recommended a trial of C P   Shortness of breath 05/21/2015   09/11/17 Right and Left Heart cath: Right radial and brachial access.Left dominant coronary system with no CAD RHC with some exercise showed RA 15, 14 mm Hg (mean 12)PA 37/14  mean 27,PWP 17,15 mean 16   Type 2 diabetes mellitus with neurological manifestations, uncontrolled 03/05/2009   HGBA1C 8.7 (A) 03/30/2018. Not much better. I want to send him to endocrine--he wants to hold off (doesn't want insulin, etc) Needs formal help---like Weight Watchers Will give him 3 months more   Vitamin B12 deficiency 06/16/2017   Does take supplement daily. Will check with full labs at next visit    Tobacco History: Social History   Tobacco Use  Smoking Status Former   Packs/day: 2.50   Years: 7.00   Total pack years: 17.50   Types: Cigarettes   Quit date: 03/18/1983   Years since quitting: 38.8   Passive exposure: Past  Smokeless Tobacco Never   Counseling given: Not Answered   Outpatient Medications Prior  to Visit  Medication Sig Dispense Refill   ALPRAZolam (XANAX) 0.5 MG tablet TAKE 1 TABLET BY MOUTH THREE TIMES A DAY AS NEEDED 90 tablet 0   Blood Glucose Calibration (ONETOUCH VERIO) SOLN AS DIRECTED 1 each 1   Blood Glucose Monitoring Suppl (ONETOUCH VERIO FLEX SYSTEM) w/Device KIT Use as directed,once daily to test blood sugar 1 kit 0   BYDUREON BCISE 2 MG/0.85ML AUIJ INJECT 2 MG INTO THE SKIN ONCE A WEEK. 10.2 mL 11   dapagliflozin propanediol (FARXIGA) 10 MG TABS tablet Take 1 tablet (10 mg total) by mouth daily before breakfast. 90 tablet 3   esomeprazole (NEXIUM) 20 MG capsule Take 40 mg by mouth 2 (two) times daily before a meal.     finasteride (PROSCAR) 5 MG tablet TAKE 1 TABLET BY MOUTH EVERY DAY (Patient taking differently: Take 10 mg by mouth daily.) 90 tablet 3   glipiZIDE (GLUCOTROL XL) 10 MG 24 hr tablet TAKE 1 TABLET (10 MG TOTAL) BY MOUTH  DAILY WITH BREAKFAST. 90 tablet 3   glucose blood (ONETOUCH VERIO) test strip CHECK BLOOD SUGAR EVERY DAY. E11.42 50 strip 11   hydrocortisone 2.5 % cream APPLY TOPICALLY 3 TIMES DAILY AS NEEDED. 28 g 3   ketorolac (ACULAR) 0.5 % ophthalmic solution USE AS DIRECTED 5 mL 1   metFORMIN (GLUCOPHAGE-XR) 500 MG 24 hr tablet Take 2 tablets (1,000 mg total) by mouth in the morning and at bedtime. 360 tablet 3   Meth-Hyo-M Bl-Na Phos-Ph Sal (URO-MP) 118 MG CAPS Take 1 capsule by mouth every 6 (six) hours as needed.     olmesartan-hydrochlorothiazide (BENICAR HCT) 20-12.5 MG tablet Take 0.5 tablets by mouth daily. 1 tablet 0   ONETOUCH DELICA LANCETS FINE MISC 1 Units by Does not apply route daily. 100 each 2   rosuvastatin (CRESTOR) 10 MG tablet TAKE 1 TABLET BY MOUTH EVERY DAY 90 tablet 3   tamsulosin (FLOMAX) 0.4 MG CAPS capsule Take 2 capsules (0.8 mg total) by mouth daily. Patient reports taking 2 0.15m capsules. 1 capsule 0   urea (CARMOL) 40 % CREA APPLY PEA SIZED AMOUNT TO HARD AREA BELOW PINKY TOE TWICE A DAY 28.35 g 1   vitamin B-12  (CYANOCOBALAMIN) 1000 MCG tablet Take 1 tablet (1,000 mcg total) by mouth daily.     nitroGLYCERIN (NITROSTAT) 0.4 MG SL tablet Place under the tongue.     albuterol (PROVENTIL HFA;VENTOLIN HFA) 108 (90 Base) MCG/ACT inhaler Inhale into the lungs.     No facility-administered medications prior to visit.     Review of Systems:   Constitutional: No weight loss or gain, night sweats, fevers, chills, fatigue, or lassitude. HEENT: No headaches, difficulty swallowing, tooth/dental problems, or sore throat. No sneezing, itching, ear ache, nasal congestion, or post nasal drip. +morning dry mouth CV:  No chest pain, orthopnea, PND, swelling in lower extremities, anasarca, dizziness, palpitations, syncope Resp: No shortness of breath with exertion or at rest. No excess mucus or change in color of mucus. No productive or non-productive. No hemoptysis. No wheezing.  No chest wall deformity GI:  No heartburn, indigestion, abdominal pain, nausea, vomiting, diarrhea, change in bowel habits, loss of appetite, bloody stools.  GU: +nocturia. No dysuria, change in color of urine, urgency or frequency.  No flank pain, no hematuria  Skin: No rash, lesions, ulcerations MSK:  No joint pain or swelling.  No decreased range of motion.  No back pain. Neuro: No dizziness or lightheadedness. +memory impairment Psych: No depression or anxiety. Mood stable. +sleep disturbance    Physical Exam:  BP 100/68   Pulse 95   Ht _0  (1.956 m)   Wt 246 lb 8 oz (111.8 kg)   SpO2 97%   BMI 29.23 kg/m   GEN: Pleasant, interactive, well-appearing; in no acute distress. HEENT:  Normocephalic and atraumatic. PERRLA. Sclera white. Nasal turbinates pink, moist and patent bilaterally. No rhinorrhea present. Oropharynx pink and moist, without exudate or edema. No lesions, ulcerations, or postnasal drip. Mallampati III NECK:  Supple w/ fair ROM. No JVD present. Normal carotid impulses w/o bruits. Thyroid symmetrical with no  goiter or nodules palpated. No lymphadenopathy.   CV: RRR, no m/r/g, no peripheral edema. Pulses intact, +2 bilaterally. No cyanosis, pallor or clubbing. PULMONARY:  Unlabored, regular breathing. Clear bilaterally A&P w/o wheezes/rales/rhonchi. No accessory muscle use.  GI: BS present and normoactive. Soft, non-tender to palpation. No organomegaly or masses detected.  MSK: No erythema, warmth or tenderness. Cap refil <2 sec all extrem. No deformities  or joint swelling noted.  Neuro: A/Ox3. No focal deficits noted.   Skin: Warm, no lesions or rashe Psych: Normal affect and behavior. Judgement and thought content appropriate.     Lab Results:  CBC    Component Value Date/Time   WBC 4.6 03/01/2021 0934   RBC 4.37 03/01/2021 0934   HGB 12.8 (L) 03/01/2021 0934   HCT 38.0 (L) 03/01/2021 0934   PLT 243.0 03/01/2021 0934   MCV 86.8 03/01/2021 0934   MCV 90.5 07/09/2013 1608   MCH 28.9 07/09/2013 1608   MCH 30.0 09/13/2010 1605   MCHC 33.8 03/01/2021 0934   RDW 13.2 03/01/2021 0934   LYMPHSABS 2.8 06/09/2017 1107   MONOABS 0.4 06/09/2017 1107   EOSABS 0.1 06/09/2017 1107   BASOSABS 0.0 06/09/2017 1107    BMET    Component Value Date/Time   NA 138 03/01/2021 0934   K 4.0 03/01/2021 0934   CL 104 03/01/2021 0934   CO2 28 03/01/2021 0934   GLUCOSE 181 (H) 03/01/2021 0934   BUN 17 03/01/2021 0934   CREATININE 0.96 03/01/2021 0934   CREATININE 0.92 07/09/2013 1635   CALCIUM 9.2 03/01/2021 0934   GFRNONAA >60 09/13/2010 1605   GFRAA >60 09/13/2010 1605    BNP No results found for: "BNP"   Imaging:  No results found.        No data to display          No results found for: "NITRICOXIDE"      Assessment & Plan:   OSA (obstructive sleep apnea) Severe OSA with AHI 37.7/hr. He is not currently on CPAP therapy. He has associated nocturia, morning dry mouth, and memory impairment. BMI 29. Discussed potential treatment options; he is agreeable to restarting CPAP  therapy - auto 5-20 cmH2O, mask of choice and heated humidification.    - discussed how weight can impact sleep and risk for sleep disordered breathing - discussed options to assist with weight loss: combination of diet modification, cardiovascular and strength training exercises   - had an extensive discussion regarding the adverse health consequences related to untreated sleep disordered breathing - specifically discussed the risks for hypertension, coronary artery disease, cardiac dysrhythmias, cerebrovascular disease, and diabetes - lifestyle modification discussed   - discussed how sleep disruption can increase risk of accidents, particularly when driving - safe driving practices were discussed  Patient Instructions  Start CPAP auto 5-20 cmH2O every night, minimum of 4-6 hours a night.  Change equipment every 30 days or as directed by DME. Wash your tubing with warm soap and water daily, hang to dry. Wash humidifier portion weekly.  Be aware of reduced alertness and do not drive or operate heavy machinery if experiencing this or drowsiness.  Avoid or decrease alcohol consumption and medications that make you more sleepy, if possible. Notify if persistent daytime sleepiness occurs even with consistent use of CPAP.  We discussed how untreated sleep apnea puts an individual at risk for cardiac arrhthymias, pulm HTN, DM, stroke and increases their risk for daytime accidents. We also briefly reviewed treatment options including weight loss, side sleeping position, oral appliance, CPAP therapy or referral to ENT for possible surgical options  Follow up in 10-12 weeks with Joellen Jersey Ambera Fedele,NP or sooner if needed    I spent 35 minutes of dedicated to the care of this patient on the date of this encounter to include pre-visit review of records, face-to-face time with the patient discussing conditions above, post visit ordering of testing, clinical documentation  with the electronic health record, making  appropriate referrals as documented, and communicating necessary findings to members of the patients care team.  Clayton Bibles, NP 01/29/2022  Pt aware and understands NP's role.

## 2022-01-29 NOTE — Assessment & Plan Note (Signed)
Severe OSA with AHI 37.7/hr. He is not currently on CPAP therapy. He has associated nocturia, morning dry mouth, and memory impairment. BMI 29. Discussed potential treatment options; he is agreeable to restarting CPAP therapy - auto 5-20 cmH2O, mask of choice and heated humidification.    - discussed how weight can impact sleep and risk for sleep disordered breathing - discussed options to assist with weight loss: combination of diet modification, cardiovascular and strength training exercises   - had an extensive discussion regarding the adverse health consequences related to untreated sleep disordered breathing - specifically discussed the risks for hypertension, coronary artery disease, cardiac dysrhythmias, cerebrovascular disease, and diabetes - lifestyle modification discussed   - discussed how sleep disruption can increase risk of accidents, particularly when driving - safe driving practices were discussed  Patient Instructions  Start CPAP auto 5-20 cmH2O every night, minimum of 4-6 hours a night.  Change equipment every 30 days or as directed by DME. Wash your tubing with warm soap and water daily, hang to dry. Wash humidifier portion weekly.  Be aware of reduced alertness and do not drive or operate heavy machinery if experiencing this or drowsiness.  Avoid or decrease alcohol consumption and medications that make you more sleepy, if possible. Notify if persistent daytime sleepiness occurs even with consistent use of CPAP.  We discussed how untreated sleep apnea puts an individual at risk for cardiac arrhthymias, pulm HTN, DM, stroke and increases their risk for daytime accidents. We also briefly reviewed treatment options including weight loss, side sleeping position, oral appliance, CPAP therapy or referral to ENT for possible surgical options  Follow up in 10-12 weeks with Alejandro Jersey Rhianon Zabawa,NP or sooner if needed

## 2022-01-29 NOTE — Patient Instructions (Addendum)
Start CPAP auto 5-20 cmH2O every night, minimum of 4-6 hours a night.  Change equipment every 30 days or as directed by DME. Wash your tubing with warm soap and water daily, hang to dry. Wash humidifier portion weekly.  Be aware of reduced alertness and do not drive or operate heavy machinery if experiencing this or drowsiness.  Avoid or decrease alcohol consumption and medications that make you more sleepy, if possible. Notify if persistent daytime sleepiness occurs even with consistent use of CPAP.  We discussed how untreated sleep apnea puts an individual at risk for cardiac arrhthymias, pulm HTN, DM, stroke and increases their risk for daytime accidents. We also briefly reviewed treatment options including weight loss, side sleeping position, oral appliance, CPAP therapy or referral to ENT for possible surgical options  Follow up in 10-12 weeks with [name],NP or sooner if needed

## 2022-01-30 ENCOUNTER — Encounter: Payer: Self-pay | Admitting: Internal Medicine

## 2022-01-30 ENCOUNTER — Other Ambulatory Visit: Payer: Self-pay | Admitting: Internal Medicine

## 2022-01-30 ENCOUNTER — Ambulatory Visit (INDEPENDENT_AMBULATORY_CARE_PROVIDER_SITE_OTHER): Payer: Medicare HMO | Admitting: Internal Medicine

## 2022-01-30 VITALS — BP 108/60 | HR 92 | Temp 97.9°F | Ht 77.0 in | Wt 246.0 lb

## 2022-01-30 DIAGNOSIS — J069 Acute upper respiratory infection, unspecified: Secondary | ICD-10-CM | POA: Diagnosis not present

## 2022-01-30 MED ORDER — HYDROCODONE BIT-HOMATROP MBR 5-1.5 MG/5ML PO SOLN: 5.0000 mL | Freq: Every evening | ORAL | 0 refills | Status: DC | PRN

## 2022-01-30 NOTE — Assessment & Plan Note (Signed)
Typical, hopefully self limited, infection Discussed analgesics, OTC cough in day Hycodan for night If worsens next week, will try empiric amoxil

## 2022-01-30 NOTE — Progress Notes (Signed)
Subjective:    Patient ID: Alejandro Koch, male    DOB: [birth year], 71 y.o.   MRN: [id number]  HPI Here due to respiratory illness  Started about a week ago Lots of cough--mostly at night Some runny nose and post nasal drip Feels it in chest---points bronchial Up in sinuses also No fever, sweats or chills Some aching No ear pain---constant tinnitus persists No SOB  Hasn't taken anything but ASA and ibuprofen  Current Outpatient Medications on File Prior to Visit  Medication Sig Dispense Refill   ALPRAZolam (XANAX) 0.5 MG tablet TAKE 1 TABLET BY MOUTH THREE TIMES A DAY AS NEEDED 90 tablet 0   Blood Glucose Calibration (ONETOUCH VERIO) SOLN AS DIRECTED 1 each 1   Blood Glucose Monitoring Suppl (ONETOUCH VERIO FLEX SYSTEM) w/Device KIT Use as directed,once daily to test blood sugar 1 kit 0   BYDUREON BCISE 2 MG/0.85ML AUIJ INJECT 2 MG INTO THE SKIN ONCE A WEEK. 10.2 mL 11   dapagliflozin propanediol (FARXIGA) 10 MG TABS tablet Take 1 tablet (10 mg total) by mouth daily before breakfast. 90 tablet 3   esomeprazole (NEXIUM) 20 MG capsule Take 40 mg by mouth 2 (two) times daily before a meal.     finasteride (PROSCAR) 5 MG tablet TAKE 1 TABLET BY MOUTH EVERY DAY (Patient taking differently: Take 10 mg by mouth daily.) 90 tablet 3   glipiZIDE (GLUCOTROL XL) 10 MG 24 hr tablet TAKE 1 TABLET (10 MG TOTAL) BY MOUTH DAILY WITH BREAKFAST. 90 tablet 3   glucose blood (ONETOUCH VERIO) test strip CHECK BLOOD SUGAR EVERY DAY. E11.42 50 strip 11   hydrocortisone 2.5 % cream APPLY TOPICALLY 3 TIMES DAILY AS NEEDED. 28 g 3   ketorolac (ACULAR) 0.5 % ophthalmic solution USE AS DIRECTED 5 mL 1   metFORMIN (GLUCOPHAGE-XR) 500 MG 24 hr tablet Take 2 tablets (1,000 mg total) by mouth in the morning and at bedtime. 360 tablet 3   Meth-Hyo-M Bl-Na Phos-Ph Sal (URO-MP) 118 MG CAPS Take 1 capsule by mouth every 6 (six) hours as needed.     olmesartan-hydrochlorothiazide (BENICAR HCT) 20-12.5 MG tablet Take  0.5 tablets by mouth daily. 1 tablet 0   ONETOUCH DELICA LANCETS FINE MISC 1 Units by Does not apply route daily. 100 each 2   rosuvastatin (CRESTOR) 10 MG tablet TAKE 1 TABLET BY MOUTH EVERY DAY 90 tablet 3   tamsulosin (FLOMAX) 0.4 MG CAPS capsule Take 2 capsules (0.8 mg total) by mouth daily. Patient reports taking 2 0.32m capsules. 1 capsule 0   urea (CARMOL) 40 % CREA APPLY PEA SIZED AMOUNT TO HARD AREA BELOW PINKY TOE TWICE A DAY 28.35 g 1   vitamin B-12 (CYANOCOBALAMIN) 1000 MCG tablet Take 1 tablet (1,000 mcg total) by mouth daily.     nitroGLYCERIN (NITROSTAT) 0.4 MG SL tablet Place under the tongue.     No current facility-administered medications on file prior to visit.    Allergies  Allergen Reactions   Iohexol      Code: HIVES, Desc: PT IS ALLERGIC TO IVP DYE AS NOTED IN PREVIOUS RECORDS IN PACS 02/2009/RM, Onset Date: [id number]   Atorvastatin Other (See Comments)    Myalgias with this and rosuvastin    Past Medical History:  Diagnosis Date   Abdominal discomfort, epigastric 06/10/2017   Actinic keratoses 05/12/2014   Acute chest pain 12/20/2007   Acute prostatitis 06/10/2017   Classic presentation; will treat with sulfa x 2 weeks. He did well with  this back in March   Allergic rhinitis 09/30/2006   Anemia    Benign essential hypertension 09/30/2006   BP Readings from Last 3 Encounters: 06/16/17 138/72  06/09/17 118/66  12/09/16 136/82   Reasonable control   Blood transfusion without reported diagnosis    40 yrs ago    BPH with obstruction/lower urinary tract symptoms 12/27/2018   Still with mild symptoms. If worsens, will need to go back to the urologist   CHF (congestive heart failure)    Chronic cough 07/16/2018   Fits definition of chronic bronchitis except not 2 years in a row. Seeing the pulmonary doctor. Doesn't feel the breo has helped   Extensor tendon laceration of right hand with open wound 09/16/2016   Fatigue 06/10/2017   Fatty liver 03/01/2010    LFTs normal. Still needs more weight loss   GERD (gastroesophageal reflux disease) 09/30/2006   Tried omeprazole and symptoms all came back. Will order the nexium again   Glaucoma    Hyperlipemia 09/30/2006   Inconsistent with the atorvastatin. Discussed---will take it twice a week   Hyperlipidemia    Hypogonadism in male 09/19/2009   Kidney stones    Laryngopharyngeal reflux (LPR) 06/03/2016   Left foot pain 09/20/2019   Major depressive disorder    Mild neurocognitive disorder 05/18/2020   OSA (obstructive sleep apnea) 09/30/2006   NPSG 07/2014:  AHI 23/hr, PLMS 452 with 12/hr with A/A NPSG 07/2014:  AHI 23/hr, PLMS 452 with 12/hr with A/A. The patient has moderate obstructive sleep apnea by his recent study, and is very symptomatic during the day. I have discussed with him the various treatment options, and have recommended a trial of C P   Shortness of breath 05/21/2015   09/11/17 Right and Left Heart cath: Right radial and brachial access.Left dominant coronary system with no CAD RHC with some exercise showed RA 15, 14 mm Hg (mean 12)PA 37/14  mean 27,PWP 17,15 mean 16   Type 2 diabetes mellitus with neurological manifestations, uncontrolled 03/05/2009   HGBA1C 8.7 (A) 03/30/2018. Not much better. I want to send him to endocrine--he wants to hold off (doesn't want insulin, etc) Needs formal help---like [employer] Will give him 3 months more   Vitamin B12 deficiency 06/16/2017   Does take supplement daily. Will check with full labs at next visit    Past Surgical History:  Procedure Laterality Date   CARDIAC CATHETERIZATION     [facility] - myocard perf and echo done as well - all normal- EF 48-55% 2019 April and june 2019   COLONOSCOPY  2008   FINGER SURGERY  2013   infection 2nd finger left hand   POLYPECTOMY     REPAIR FLEXOR TENDON HAND Right 08/2016    Family History  Problem Relation Age of Onset   Alzheimer's disease Mother    Heart disease Father    COPD Father    Colon  cancer Father 55   Prostate cancer Father    Dementia Father    Colon polyps Brother    Alzheimer's disease Maternal Grandmother    Leukemia Brother    Leukemia Other    Stomach cancer Neg Hx    Esophageal cancer Neg Hx    Rectal cancer Neg Hx     Social History   Socioeconomic History   Marital status: Married    Spouse name: Not on file   Number of children: 1   Years of education: 14   Highest education level: Associate degree:  academic program  Occupational History   Occupation: Retired    Comment: EMS, Customer service manager  Tobacco Use   Smoking status: Former    Packs/day: 2.50    Years: 7.00    Total pack years: 17.50    Types: Cigarettes    Quit date: 03/18/1983    Years since quitting: 38.8    Passive exposure: Past   Smokeless tobacco: Never  Vaping Use   Vaping Use: Never used  Substance and Sexual Activity   Alcohol use: Not Currently    Comment: occasional glass of wine   Drug use: No   Sexual activity: Not on file  Other Topics Concern   Not on file  Social History Narrative   No living will or health care POA thus far   Wife, then son, should make decisions. May make son the primary person   Would accept resuscitation attempts but wouldn't want to be on machines   No tube feeds if cognitively unaware   Social Determinants of Health   Financial Resource Strain: Not on file  Food Insecurity: Not on file  Transportation Needs: Not on file  Physical Activity: Not on file  Stress: Not on file  Social Connections: Not on file  Intimate Partner Violence: Not on file   Review of Systems No N/V Eating okay Has lost 20# in the past 2 years     Objective:   Physical Exam Constitutional:      Appearance: Normal appearance.  HENT:     Head:     Comments: No sinus tenderness    Right Ear: Tympanic membrane and ear canal normal.     Left Ear: Tympanic membrane and ear canal normal.     Nose: Congestion present.     Mouth/Throat:     Pharynx: No  oropharyngeal exudate or posterior oropharyngeal erythema.  Pulmonary:     Effort: Pulmonary effort is normal.     Breath sounds: Normal breath sounds. No wheezing or rales.  Musculoskeletal:     Cervical back: Neck supple.  Lymphadenopathy:     Cervical: No cervical adenopathy.  Neurological:     Mental Status: He is alert.            Assessment & Plan:

## 2022-01-31 MED ORDER — TAMSULOSIN HCL 0.4 MG PO CAPS: 0.8000 mg | ORAL_CAPSULE | Freq: Every day | ORAL | 3 refills | Status: DC

## 2022-02-03 MED ORDER — AMOXICILLIN 500 MG PO TABS: 1000.0000 mg | ORAL_TABLET | Freq: Two times a day (BID) | ORAL | 0 refills | Status: AC

## 2022-02-03 NOTE — Addendum Note (Signed)
Addended by: Viviana Simpler I on: 02/03/2022 02:15 PM   Modules accepted: Orders

## 2022-02-10 MED ORDER — TAMSULOSIN HCL 0.4 MG PO CAPS: 0.8000 mg | ORAL_CAPSULE | Freq: Every day | ORAL | 3 refills | Status: DC

## 2022-02-10 NOTE — Addendum Note (Signed)
Addended by: Pilar Grammes on: 02/10/2022 11:35 AM   Modules accepted: Orders

## 2022-02-11 ENCOUNTER — Telehealth: Payer: Self-pay | Admitting: Internal Medicine

## 2022-02-11 NOTE — Telephone Encounter (Signed)
Left message on cell to see if pt requested this or if it was an auto refill.

## 2022-02-11 NOTE — Telephone Encounter (Signed)
Pt called in stated he requested RX refilled. Would like a call back [phone]

## 2022-02-12 ENCOUNTER — Other Ambulatory Visit: Payer: Self-pay | Admitting: Internal Medicine

## 2022-02-12 MED ORDER — AMOXICILLIN 500 MG PO TABS: 1000.0000 mg | ORAL_TABLET | Freq: Two times a day (BID) | ORAL | 0 refills | Status: AC

## 2022-02-12 MED ORDER — HYDROCODONE BIT-HOMATROP MBR 5-1.5 MG/5ML PO SOLN: 5.0000 mL | Freq: Every evening | ORAL | 0 refills | Status: DC | PRN

## 2022-02-12 NOTE — Telephone Encounter (Signed)
Please let him know that I did refill the cough medicine--but I am a bit surprised that he needed more. Is he still sick--or just residual cough? May need visit if still not feeling well

## 2022-02-12 NOTE — Telephone Encounter (Signed)
Pt called in requesting an antibiotics . Advise pt that his cough medicine was called in . Pt refuse to schedule appointment .

## 2022-02-12 NOTE — Telephone Encounter (Signed)
Spoke to pt's wife. She said [facility] would not fill the cough syrup stating it was hydrocodone and he already had already had a bottle of it. I told her to tell them to call us if they have an issue.

## 2022-02-12 NOTE — Addendum Note (Signed)
Addended by: Viviana Simpler I on: 02/12/2022 01:09 PM   Modules accepted: Orders

## 2022-02-13 ENCOUNTER — Other Ambulatory Visit: Payer: Medicare HMO

## 2022-02-13 NOTE — Telephone Encounter (Signed)
This has already been done by Dr [name]

## 2022-02-21 ENCOUNTER — Encounter: Payer: Self-pay | Admitting: Internal Medicine

## 2022-02-21 MED ORDER — BENZONATATE 200 MG PO CAPS: 200.0000 mg | ORAL_CAPSULE | Freq: Three times a day (TID) | ORAL | 0 refills | Status: DC | PRN

## 2022-02-21 MED ORDER — AMOXICILLIN-POT CLAVULANATE 875-125 MG PO TABS: 1.0000 | ORAL_TABLET | Freq: Two times a day (BID) | ORAL | 0 refills | Status: DC

## 2022-02-27 ENCOUNTER — Telehealth: Payer: Self-pay | Admitting: Nurse Practitioner

## 2022-02-27 NOTE — Telephone Encounter (Signed)
Alejandro Koch with [facility] sent an email about this patient "I wanted to let you know the following order has been voided due to no patient response.  A couple of them were thinking about it; and they haven't responded to the voice mails that have been left for them.    Provider: Tilda Franco DOB: 1950-09-01

## 2022-02-28 ENCOUNTER — Encounter: Payer: Self-pay | Admitting: Neurology

## 2022-02-28 NOTE — Telephone Encounter (Signed)
This patient has severe OSA. Can you follow up on this and see if there were any issues or concerns that we can help with? Thanks!

## 2022-02-28 NOTE — Telephone Encounter (Signed)
ATC pt LVM for him to call office back in regards to the CPAP order

## 2022-03-02 ENCOUNTER — Ambulatory Visit
Admission: RE | Admit: 2022-03-02 | Discharge: 2022-03-02 | Disposition: A | Discharge status: Home / Self Care | Payer: Medicare HMO | Source: Ambulatory Visit | Attending: Neurology | Admitting: Neurology

## 2022-03-02 ENCOUNTER — Other Ambulatory Visit: Payer: Medicare HMO

## 2022-03-02 DIAGNOSIS — R42 Dizziness and giddiness: Secondary | ICD-10-CM

## 2022-03-03 ENCOUNTER — Telehealth: Payer: Self-pay

## 2022-03-03 ENCOUNTER — Telehealth: Payer: Self-pay | Admitting: Anesthesiology

## 2022-03-03 NOTE — Telephone Encounter (Signed)
Pt's wife [name] called stating the MRI appointment her husband needed had been scheduled however now that has been cancelled due to insurance.  Pt's wife would like a call back.

## 2022-03-03 NOTE — Telephone Encounter (Signed)
Called Pt because Alejandro Koch received a fax from [facility] stating that they could not get in touch with the Pt to start a new CPAP. Pt stated that he did receive the messages but had not returned the call to Alejandro Koch. I askd the Pt if he still wanted to do CPAP therapy and he stated yes. Pt received the number to [facility] and stated that he would give them a call. I also called [facility] and let them know that he would be calling them to set up new CPAP. [facility] stated that they would keep the order til he calls. Nothing further needed.

## 2022-03-03 NOTE — Telephone Encounter (Signed)
Following up with [facility] on Pt MRI

## 2022-03-04 ENCOUNTER — Telehealth: Payer: Self-pay

## 2022-03-04 NOTE — Telephone Encounter (Signed)
Per Taron MRA scheduled for 03/24/22

## 2022-03-04 NOTE — Telephone Encounter (Signed)
-----   Message from Shellia Carwin, MD sent at 03/04/2022  7:50 AM EST ----- Can we let Dr. Amparo Bristol patient know that his MRI brain showed no significant abnormalities? Thank you.  Kai Levins, MD

## 2022-03-04 NOTE — Telephone Encounter (Signed)
I left a message for the patient to return my call.

## 2022-03-06 ENCOUNTER — Ambulatory Visit
Admission: EM | Admit: 2022-03-06 | Discharge: 2022-03-06 | Disposition: A | Discharge status: Home / Self Care | Payer: Medicare HMO | Attending: Emergency Medicine | Admitting: Emergency Medicine

## 2022-03-06 ENCOUNTER — Telehealth: Payer: Self-pay | Admitting: *Deleted

## 2022-03-06 ENCOUNTER — Telehealth: Payer: Self-pay | Admitting: Neurology

## 2022-03-06 DIAGNOSIS — S61012A Laceration without foreign body of left thumb without damage to nail, initial encounter: Secondary | ICD-10-CM

## 2022-03-06 DIAGNOSIS — Z23 Encounter for immunization: Secondary | ICD-10-CM

## 2022-03-06 MED ORDER — TETANUS-DIPHTH-ACELL PERTUSSIS 5-2.5-18.5 LF-MCG/0.5 IM SUSY
0.5000 mL | PREFILLED_SYRINGE | Freq: Once | INTRAMUSCULAR | Status: AC
  Administered 2022-03-06: 0.5 mL via INTRAMUSCULAR

## 2022-03-06 MED ORDER — CEPHALEXIN 500 MG PO CAPS: 500.0000 mg | ORAL_CAPSULE | Freq: Four times a day (QID) | ORAL | 0 refills | Status: DC

## 2022-03-06 NOTE — Telephone Encounter (Signed)
Noted  

## 2022-03-06 NOTE — ED Triage Notes (Signed)
Patient to Urgent Care with complaints of laceration present to left thumb, injury occurred today. 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 Bleeding well controlled at this time.  TDAP 09/07/2016.

## 2022-03-06 NOTE — Telephone Encounter (Signed)
Pt's wife called in and left a message. She is returning our call for results

## 2022-03-06 NOTE — Discharge Instructions (Addendum)
Your tetanus was updated today.    Your stitches need to be removed in 7-10 days.    Take the antibiotic as directed.    Keep your wound clean and dry.  Wash it gently twice a day with soap and water.      Follow up right away if you see signs of infection, such as increased pain, redness, pus-like drainage, warmth, fever, chills, or other concerning symptoms.

## 2022-03-06 NOTE — Telephone Encounter (Signed)
Pt walked in with a injury to hand, front desk ask me to assist with Triage. About 2hrs ago pt injured left thumb on screw/pipe fixing something in his home. Pt has about an 1 inch laceration that he said has not stopped bleeding (x2hrs) . I looked at laceration and it is oozing a small amount of blood. Pt said he does take 81 mg asa, spoke with Allie Bossier, NP since PCP out of the office and she advised pt that he needs to be seen at an UC given we have no appts here. Pt advised and agreed with recommendations. Address given and helped pt get checked in at the UC in [place] to Allie Bossier, NP

## 2022-03-06 NOTE — ED Provider Notes (Signed)
Alejandro Koch    CSN: [id number] Arrival date & time: 03/06/22  1648      History   Chief Complaint Chief Complaint  Patient presents with   Laceration    Left thumb - Entered by patient    HPI Alejandro Koch is a 70 y.o. male.  Patient presents with a laceration of his left thumb that occurred this afternoon when he accidentally cut it on a shower pipe.  Bleeding controlled with direct pressure.  He denies numbness, weakness, fever, chills, or other symptoms.  Last tetanus 2018.  His medical history includes diabetes.  The history is provided by the patient and medical records.    Past Medical History:  Diagnosis Date   Abdominal discomfort, epigastric 06/10/2017   Actinic keratoses 05/12/2014   Acute chest pain 12/20/2007   Acute prostatitis 06/10/2017   Classic presentation; will treat with sulfa x 2 weeks. He did well with this back in March   Allergic rhinitis 09/30/2006   Anemia    Benign essential hypertension 09/30/2006   BP Readings from Last 3 Encounters: 06/16/17 138/72  06/09/17 118/66  12/09/16 136/82   Reasonable control   Blood transfusion without reported diagnosis    40 yrs ago    BPH with obstruction/lower urinary tract symptoms 12/27/2018   Still with mild symptoms. If worsens, will need to go back to the urologist   CHF (congestive heart failure)    Chronic cough 07/16/2018   Fits definition of chronic bronchitis except not 2 years in a row. Seeing the pulmonary doctor. Doesn't feel the breo has helped   Extensor tendon laceration of right hand with open wound 09/16/2016   Fatigue 06/10/2017   Fatty liver 03/01/2010   LFTs normal. Still needs more weight loss   GERD (gastroesophageal reflux disease) 09/30/2006   Tried omeprazole and symptoms all came back. Will order the nexium again   Glaucoma    Hyperlipemia 09/30/2006   Inconsistent with the atorvastatin. Discussed---will take it twice a week   Hyperlipidemia    Hypogonadism in male  09/19/2009   Kidney stones    Laryngopharyngeal reflux (LPR) 06/03/2016   Left foot pain 09/20/2019   Major depressive disorder    Mild neurocognitive disorder 05/18/2020   OSA (obstructive sleep apnea) 09/30/2006   NPSG 07/2014:  AHI 23/hr, PLMS 452 with 12/hr with A/A NPSG 07/2014:  AHI 23/hr, PLMS 452 with 12/hr with A/A. The patient has moderate obstructive sleep apnea by his recent study, and is very symptomatic during the day. I have discussed with him the various treatment options, and have recommended a trial of C P   Shortness of breath 05/21/2015   09/11/17 Right and Left Heart cath: Right radial and brachial access.Left dominant coronary system with no CAD RHC with some exercise showed RA 15, 14 mm Hg (mean 12)PA 37/14  mean 27,PWP 17,15 mean 16   Type 2 diabetes mellitus with neurological manifestations, uncontrolled 03/05/2009   HGBA1C 8.7 (A) 03/30/2018. Not much better. I want to send him to endocrine--he wants to hold off (doesn't want insulin, etc) Needs formal help---like Weight Watchers Will give him 3 months more   Vitamin B12 deficiency 06/16/2017   Does take supplement daily. Will check with full labs at next visit    Patient Active Problem List   Diagnosis Date Noted   Viral URI with cough 01/30/2022   Skin lesion 11/06/2021   Preventative health care 03/01/2021   Mood disorder ([place]) 03/01/2021  Mild neurocognitive disorder 05/18/2020   Major depressive disorder    BPH with obstruction/lower urinary tract symptoms 12/27/2018   Vitamin B12 deficiency 06/16/2017   Laryngopharyngeal reflux (LPR) 06/03/2016   Anemia 07/23/2015   Fatty liver 03/01/2010   Type 2 DM with diabetic neuropathy affecting both sides of body ([place]) 03/05/2009   Hyperlipemia 09/30/2006   Benign essential hypertension 09/30/2006   Allergic rhinitis 09/30/2006   GERD (gastroesophageal reflux disease) 09/30/2006   OSA (obstructive sleep apnea) 09/30/2006    Past Surgical History:  Procedure  Laterality Date   CARDIAC CATHETERIZATION     [facility] - myocard perf and echo done as well - all normal- EF 48-55% 2019 April and june 2019   COLONOSCOPY  2008   FINGER SURGERY  2013   infection 2nd finger left hand   POLYPECTOMY     REPAIR FLEXOR TENDON HAND Right 08/2016       Home Medications    Prior to Admission medications   Medication Sig Start Date End Date Taking? Authorizing Provider  cephALEXin (KEFLEX) 500 MG capsule Take 1 capsule (500 mg total) by mouth 4 (four) times daily. 03/06/22  Yes Sharion Balloon, NP  ALPRAZolam Duanne Moron) 0.5 MG tablet TAKE 1 TABLET BY MOUTH THREE TIMES A DAY AS NEEDED 05/02/21   Venia Carbon, MD  benzonatate (TESSALON) 200 MG capsule Take 1 capsule (200 mg total) by mouth 3 (three) times daily as needed for cough. 02/21/22   Venia Carbon, MD  Blood Glucose Calibration (ONETOUCH VERIO) SOLN AS DIRECTED 09/06/21   Venia Carbon, MD  Blood Glucose Monitoring Suppl (ONETOUCH VERIO FLEX SYSTEM) w/Device KIT Use as directed,once daily to test blood sugar 09/06/21   Venia Carbon, MD  BYDUREON BCISE 2 MG/0.85ML AUIJ INJECT 2 MG INTO THE SKIN ONCE A WEEK. 12/30/21   Venia Carbon, MD  dapagliflozin propanediol (FARXIGA) 10 MG TABS tablet Take 1 tablet (10 mg total) by mouth daily before breakfast. 05/31/21   Venia Carbon, MD  esomeprazole (NEXIUM) 20 MG capsule Take 40 mg by mouth 2 (two) times daily before a meal.    [provider]  finasteride (PROSCAR) 5 MG tablet TAKE 1 TABLET BY MOUTH EVERY DAY Patient taking differently: Take 10 mg by mouth daily. 11/13/21   Venia Carbon, MD  glipiZIDE (GLUCOTROL XL) 10 MG 24 hr tablet TAKE 1 TABLET (10 MG TOTAL) BY MOUTH DAILY WITH BREAKFAST. 05/13/21   Viviana Simpler I, MD  glucose blood (ONETOUCH VERIO) test strip CHECK BLOOD SUGAR EVERY DAY. E11.42 12/23/21   Venia Carbon, MD  HYDROcodone bit-homatropine (HYCODAN) 5-1.5 MG/5ML syrup Take 5 mLs by mouth at bedtime as needed for  cough. 02/12/22   Viviana Simpler I, MD  hydrocortisone 2.5 % cream APPLY TOPICALLY 3 TIMES DAILY AS NEEDED. 05/13/21   Venia Carbon, MD  ketorolac (ACULAR) 0.5 % ophthalmic solution USE AS DIRECTED 06/27/13   Ricard Dillon, MD  metFORMIN (GLUCOPHAGE-XR) 500 MG 24 hr tablet Take 2 tablets (1,000 mg total) by mouth in the morning and at bedtime. 05/31/21   Venia Carbon, MD  Meth-Hyo-M Barnett Hatter Phos-Ph Sal (URO-MP) 118 MG CAPS Take 1 capsule by mouth every 6 (six) hours as needed. 05/09/16   [provider]  nitroGLYCERIN (NITROSTAT) 0.4 MG SL tablet Place under the tongue. 06/25/17 01/15/22  [provider]  olmesartan-hydrochlorothiazide (BENICAR HCT) 20-12.5 MG tablet Take 0.5 tablets by mouth daily. 05/31/21   Venia Carbon,  MD  [facility] DELICA LANCETS FINE MISC 1 Units by Does not apply route daily. 07/26/15   Venia Carbon, MD  rosuvastatin (CRESTOR) 10 MG tablet TAKE 1 TABLET BY MOUTH EVERY DAY 01/27/22   Viviana Simpler I, MD  tamsulosin (FLOMAX) 0.4 MG CAPS capsule Take 2 capsules (0.8 mg total) by mouth daily. Patient reports taking 2 0.18m capsules. 02/10/22   LVenia Carbon MD  urea (CARMOL) 40 % CREA APPLY PEA SIZED AMOUNT TO HARD AREA BELOW PINKY TOE TWICE A DAY 01/27/22   LVenia Carbon MD  vitamin B-12 (CYANOCOBALAMIN) 1000 MCG tablet Take 1 tablet (1,000 mcg total) by mouth daily. 06/10/17   GRia Bush MD    Family History Family History  Problem Relation Age of Onset   Alzheimer's disease Mother    Heart disease Father    COPD Father    Colon cancer Father [phone]  Prostate cancer Father    Dementia Father    Colon polyps Brother    Alzheimer's disease Maternal Grandmother    Leukemia Brother    Leukemia Other    Stomach cancer Neg Hx    Esophageal cancer Neg Hx    Rectal cancer Neg Hx     Social History Social History   Tobacco Use   Smoking status: Former    Packs/day: 2.50    Years: 7.00    Total pack years: 17.50     Types: Cigarettes    Quit date: 03/18/1983    Years since quitting: 38.9    Passive exposure: Past   Smokeless tobacco: Never  Vaping Use   Vaping Use: Never used  Substance Use Topics   Alcohol use: Not Currently    Comment: occasional glass of wine   Drug use: No     Allergies   Iohexol and Atorvastatin   Review of Systems Review of Systems  Constitutional:  Negative for chills and fever.  Skin:  Positive for wound. Negative for color change.  Neurological:  Negative for weakness and numbness.  All other systems reviewed and are negative.    Physical Exam Triage Vital Signs ED Triage Vitals  Enc Vitals Group     BP      Pulse      Resp      Temp      Temp src      SpO2      Weight      Height      Head Circumference      Peak Flow      Pain Score      Pain Loc      Pain Edu?      Excl. in [place]    No data found.  Updated Vital Signs BP 132/88   Pulse (!) 105   Temp 97.6 F (36.4 C)   Resp 18   Ht _0  (1.956 m)   Wt 240 lb (108.9 kg)   SpO2 98%   BMI 28.46 kg/m   Visual Acuity Right Eye Distance:   Left Eye Distance:   Bilateral Distance:    Right Eye Near:   Left Eye Near:    Bilateral Near:     Physical Exam Vitals and nursing note reviewed.  Constitutional:      General: He is not in acute distress.    Appearance: Normal appearance. He is well-developed. He is not ill-appearing.  HENT:     Mouth/Throat:     Mouth: Mucous membranes are moist.  Eyes:     Conjunctiva/sclera: Conjunctivae normal.  Cardiovascular:     Rate and Rhythm: Normal rate and regular rhythm.  Pulmonary:     Effort: Pulmonary effort is normal. No respiratory distress.  Musculoskeletal:        General: No deformity. Normal range of motion.     Cervical back: Neck supple.  Skin:    General: Skin is warm and dry.     Capillary Refill: Capillary refill takes less than 2 seconds.     Findings: Lesion present. No erythema.     Comments: Crescent shaped 1.5 cm  laceration on left thumb.  Bleeding controlled.    Neurological:     General: No focal deficit present.     Mental Status: He is alert and oriented to person, place, and time.     Sensory: No sensory deficit.     Motor: No weakness.  Psychiatric:        Mood and Affect: Mood normal.        Behavior: Behavior normal.      UC Treatments / Results  Labs (all labs ordered are listed, but only abnormal results are displayed) Labs Reviewed - No data to display  EKG   Radiology No results found.  Procedures Laceration Repair  Date/Time: 03/06/2022 6:59 PM  Performed by: Sharion Balloon, NP Authorized by: Sharion Balloon, NP   Consent:    Consent obtained:  Verbal   Consent given by:  Patient   Risks discussed:  Infection, pain, poor cosmetic result and poor wound healing Universal protocol:    Procedure explained and questions answered to patient or proxy's satisfaction: yes   Anesthesia:    Anesthesia method:  Local infiltration   Local anesthetic:  Lidocaine 1% w/o epi Laceration details:    Location:  Finger   Finger location:  L thumb   Length (cm):  1.5   Depth (mm):  2 Pre-procedure details:    Preparation:  Patient was prepped and draped in usual sterile fashion Exploration:    Hemostasis achieved with:  Direct pressure   Imaging outcome: foreign body not noted     Wound exploration: wound explored through full range of motion and entire depth of wound visualized   Treatment:    Area cleansed with:  Shur-Clens   Amount of cleaning:  Standard   Irrigation solution:  Sterile saline   Irrigation method:  Syringe   Visualized foreign bodies/material removed: no   Skin repair:    Repair method:  Sutures   Suture size:  4-0   Suture material:  Nylon   Suture technique:  Simple interrupted   Number of sutures:  3 Approximation:    Approximation:  Close Repair type:    Repair type:  Simple Post-procedure details:    Dressing:  Antibiotic ointment and  non-adherent dressing   Procedure completion:  Tolerated well, no immediate complications  (including critical care time)  Medications Ordered in UC Medications  Tdap (BOOSTRIX) injection 0.5 mL (0.5 mLs Intramuscular Given 03/06/22 1854)    Initial Impression / Assessment and Plan / UC Course  I have reviewed the triage vital signs and the nursing notes.  Pertinent labs & imaging results that were available during my care of the patient were reviewed by me and considered in my medical decision making (see chart for details).    Laceration of the left thumb.  Tetanus updated today.  3 sutures.  Treating prophylactically with cephalexin as patient is diabetic.  Wound care instructions and signs of infection discussed.  Instructed patient to return here for suture removal in 7 to 10 days.  Instructed him to return right away if he notes signs of infection.  Education provided on laceration care.  Patient agrees to plan of care.  Final Clinical Impressions(s) / UC Diagnoses   Final diagnoses:  Laceration of left thumb without foreign body without damage to nail, initial encounter     Discharge Instructions      Your tetanus was updated today.    Your stitches need to be removed in 7-10 days.    Take the antibiotic as directed.    Keep your wound clean and dry.  Wash it gently twice a day with soap and water.      Follow up right away if you see signs of infection, such as increased pain, redness, pus-like drainage, warmth, fever, chills, or other concerning symptoms.        ED Prescriptions     Medication Sig Dispense Auth. Provider   cephALEXin (KEFLEX) 500 MG capsule Take 1 capsule (500 mg total) by mouth 4 (four) times daily. 28 capsule Sharion Balloon, NP      PDMP not reviewed this encounter.   Sharion Balloon, NP 03/06/22 1901

## 2022-03-06 NOTE — Telephone Encounter (Signed)
Spoke with pt wife who is on DPR informed her that MRI brain showed no significant abnormalities

## 2022-03-18 ENCOUNTER — Encounter: Payer: Self-pay | Admitting: Internal Medicine

## 2022-03-18 ENCOUNTER — Ambulatory Visit (INDEPENDENT_AMBULATORY_CARE_PROVIDER_SITE_OTHER): Payer: Medicare HMO | Admitting: Internal Medicine

## 2022-03-18 VITALS — BP 86/58 | HR 88 | Temp 97.1°F | Ht 77.0 in | Wt 238.0 lb

## 2022-03-18 DIAGNOSIS — E1142 Type 2 diabetes mellitus with diabetic polyneuropathy: Secondary | ICD-10-CM | POA: Diagnosis not present

## 2022-03-18 DIAGNOSIS — F39 Unspecified mood [affective] disorder: Secondary | ICD-10-CM | POA: Diagnosis not present

## 2022-03-18 DIAGNOSIS — G3184 Mild cognitive impairment, so stated: Secondary | ICD-10-CM | POA: Diagnosis not present

## 2022-03-18 DIAGNOSIS — I10 Essential (primary) hypertension: Secondary | ICD-10-CM | POA: Diagnosis not present

## 2022-03-18 DIAGNOSIS — R69 Illness, unspecified: Secondary | ICD-10-CM | POA: Diagnosis not present

## 2022-03-18 LAB — HEPATIC FUNCTION PANEL
ALT: 17 U/L (ref 0–53)
AST: 17 U/L (ref 0–37)
Albumin: 4.1 g/dL (ref 3.5–5.2)
Alkaline Phosphatase: 61 U/L (ref 39–117)
Bilirubin, Direct: 0.1 mg/dL (ref 0.0–0.3)
Total Bilirubin: 0.5 mg/dL (ref 0.2–1.2)
Total Protein: 6.4 g/dL (ref 6.0–8.3)

## 2022-03-18 LAB — LIPID PANEL
Cholesterol: 148 mg/dL (ref 0–200)
HDL: 46.3 mg/dL (ref >39.00)
LDL Cholesterol: 73 mg/dL (ref 0–99)
NonHDL: 101.28
Total CHOL/HDL Ratio: 3
Triglycerides: 140 mg/dL (ref 0.0–149.0)
VLDL: 28 mg/dL (ref 0.0–40.0)

## 2022-03-18 LAB — POCT GLYCOSYLATED HEMOGLOBIN (HGB A1C): Hemoglobin A1C: 7.1 % — AB (ref 4.0–5.6)

## 2022-03-18 LAB — CBC
HCT: 36.8 % — ABNORMAL LOW (ref 39.0–52.0)
Hemoglobin: 12.8 g/dL — ABNORMAL LOW (ref 13.0–17.0)
MCHC: 34.7 g/dL (ref 30.0–36.0)
MCV: 86.1 fl (ref 78.0–100.0)
Platelets: 206 10*3/uL (ref 150.0–400.0)
RBC: 4.27 Mil/uL (ref 4.22–5.81)
RDW: 13.7 % (ref 11.5–15.5)
WBC: 4.3 10*3/uL (ref 4.0–10.5)

## 2022-03-18 LAB — TSH: TSH: 3.55 u[IU]/mL (ref 0.35–5.50)

## 2022-03-18 LAB — HM DIABETES FOOT EXAM

## 2022-03-18 LAB — VITAMIN B12: Vitamin B-12: 802 pg/mL (ref 211–911)

## 2022-03-18 LAB — RENAL FUNCTION PANEL
Albumin: 4.1 g/dL (ref 3.5–5.2)
BUN: 16 mg/dL (ref 6–23)
CO2: 28 mEq/L (ref 19–32)
Calcium: 9.1 mg/dL (ref 8.4–10.5)
Chloride: 104 mEq/L (ref 96–112)
Creatinine, Ser: 1.02 mg/dL (ref 0.40–1.50)
GFR: 73.76 mL/min (ref >60.00)
Glucose, Bld: 155 mg/dL — ABNORMAL HIGH (ref 70–99)
Phosphorus: 3.8 mg/dL (ref 2.3–4.6)
Potassium: 4.4 mEq/L (ref 3.5–5.1)
Sodium: 141 mEq/L (ref 135–145)

## 2022-03-18 LAB — MICROALBUMIN / CREATININE URINE RATIO
Creatinine,U: 143.8 mg/dL
Microalb Creat Ratio: 1.3 mg/g (ref 0.0–30.0)
Microalb, Ur: 1.9 mg/dL (ref 0.0–1.9)

## 2022-03-18 NOTE — Assessment & Plan Note (Signed)
Ongoing decline Working on getting MRA per Dr Delice Lesch Discussed fulfilling activities and social engagement

## 2022-03-18 NOTE — Assessment & Plan Note (Signed)
Does not seem to be depressed No Rx for now

## 2022-03-18 NOTE — Assessment & Plan Note (Signed)
Lab Results  Component Value Date   HGBA1C 7.1 (A) 03/18/2022   Control has improved Off insulin On the farxiga 10, glipizide 10, metformin 500 bid, bydureon '2mg'$  weekly If gets low sugars, would cut back on glipizide

## 2022-03-18 NOTE — Progress Notes (Signed)
Subjective:    Patient ID: Alejandro Koch, male    DOB: Jun 20, 1950, 72 y.o.   MRN: [id number]  HPI Here with wife for follow up of diabetes and other chronic health concerns  Has had some dizziness and almost passed out at times Gets sensation in his head--prior to the dizziness No headache but does have diplopia at times (with spells) Not in bed--usually when up and moving around Feels unstable Does check BP at timess---usually 120/60's to 70's Has cut the olmesartan/HCTZ to 1/4 tab daily No chest pain or SOB   Sugars have been good--checks it every morning No problems with low sugars  Ongoing memory issues Seeing Dr Delice Lesch still Was scheduled for MRA--but awaiting on insurance He and wife note ongoing decline --but still able to handle financial things Forgetfulness has "drastically increased" per wife No depression --but 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 10.2 mL 11   dapagliflozin propanediol (FARXIGA) 10 MG TABS tablet Take 1 tablet (10 mg total) by mouth daily before breakfast. 90 tablet 3   esomeprazole (NEXIUM) 20 MG capsule Take 40 mg by mouth 2 (two) times daily before a meal.     finasteride (PROSCAR) 5 MG tablet TAKE 1 TABLET BY MOUTH EVERY DAY (Patient taking differently: Take 10 mg by mouth daily.) 90 tablet 3   glipiZIDE (GLUCOTROL XL) 10 MG 24 hr tablet TAKE 1 TABLET (10 MG  TOTAL) BY MOUTH DAILY WITH BREAKFAST. 90 tablet 3   glucose blood (ONETOUCH VERIO) test strip CHECK BLOOD SUGAR EVERY DAY. E11.42 50 strip 11   hydrocortisone 2.5 % cream APPLY TOPICALLY 3 TIMES DAILY AS NEEDED. 28 g 3   ketorolac (ACULAR) 0.5 % ophthalmic solution USE AS DIRECTED 5 mL 1   metFORMIN (GLUCOPHAGE-XR) 500 MG 24 hr tablet Take 2 tablets (1,000 mg total) by mouth in the morning and at bedtime. 360 tablet 3   Meth-Hyo-M Bl-Na Phos-Ph Sal (URO-MP) 118 MG CAPS Take 1 capsule by mouth every 6 (six) hours as needed.     olmesartan-hydrochlorothiazide (BENICAR HCT) 20-12.5 MG tablet Take 0.5 tablets by mouth daily. 1 tablet 0   ONETOUCH DELICA LANCETS FINE MISC 1 Units by Does not apply route daily. 100 each 2   rosuvastatin (CRESTOR) 10 MG tablet TAKE 1 TABLET BY MOUTH EVERY DAY 90 tablet 3   tamsulosin (FLOMAX) 0.4 MG CAPS capsule Take 2 capsules (0.8 mg total) by mouth daily. Patient reports taking 2 0.34m capsules. 180 capsule 3   urea (CARMOL) 40 % CREA APPLY PEA SIZED AMOUNT TO HARD AREA BELOW PINKY TOE TWICE A DAY 28.35 g 1   vitamin B-12 (CYANOCOBALAMIN) 1000 MCG tablet Take 1 tablet (1,000 mcg total) by mouth daily.     nitroGLYCERIN (NITROSTAT) 0.4 MG SL tablet Place under the tongue.     No current facility-administered medications on file  prior to visit.    Allergies  Allergen Reactions   Iohexol      Code: HIVES, Desc: PT IS ALLERGIC TO IVP DYE AS NOTED IN PREVIOUS RECORDS IN PACS 02/2009/RM, Onset Date: [id number]    Atorvastatin Other (See Comments)    Myalgias with this and rosuvastin    Past Medical History:  Diagnosis Date   Abdominal discomfort, epigastric 06/10/2017   Actinic keratoses 05/12/2014   Acute chest pain 12/20/2007   Acute prostatitis 06/10/2017   Classic presentation; will treat with sulfa x 2 weeks. He did well with this back in March   Allergic rhinitis 09/30/2006   Anemia    Benign essential hypertension 09/30/2006   BP Readings from Last 3  Encounters: 06/16/17 138/72  06/09/17 118/66  12/09/16 136/82   Reasonable control   Blood transfusion without reported diagnosis    40 yrs ago    BPH with obstruction/lower urinary tract symptoms 12/27/2018   Still with mild symptoms. If worsens, will need to go back to the urologist   CHF (congestive heart failure)    Chronic cough 07/16/2018   Fits definition of chronic bronchitis except not 2 years in a row. Seeing the pulmonary doctor. Doesn't feel the breo has helped   Extensor tendon laceration of right hand with open wound 09/16/2016   Fatigue 06/10/2017   Fatty liver 03/01/2010   LFTs normal. Still needs more weight loss   GERD (gastroesophageal reflux disease) 09/30/2006   Tried omeprazole and symptoms all came back. Will order the nexium again   Glaucoma    Hyperlipemia 09/30/2006   Inconsistent with the atorvastatin. Discussed---will take it twice a week   Hyperlipidemia    Hypogonadism in male 09/19/2009   Kidney stones    Laryngopharyngeal reflux (LPR) 06/03/2016   Left foot pain 09/20/2019   Major depressive disorder    Mild neurocognitive disorder 05/18/2020   OSA (obstructive sleep apnea) 09/30/2006   NPSG 07/2014:  AHI 23/hr, PLMS 452 with 12/hr with A/A NPSG 07/2014:  AHI 23/hr, PLMS 452 with 12/hr with A/A. The patient has moderate obstructive sleep apnea by his recent study, and is very symptomatic during the day. I have discussed with him the various treatment options, and have recommended a trial of C P   Shortness of breath 05/21/2015   09/11/17 Right and Left Heart cath: Right radial and brachial access.Left dominant coronary system with no CAD RHC with some exercise showed RA 15, 14 mm Hg (mean 12)PA 37/14  mean 27,PWP 17,15 mean 16   Type 2 diabetes mellitus with neurological manifestations, uncontrolled 03/05/2009   HGBA1C 8.7 (A) 03/30/2018. Not much better. I want to send him to endocrine--he wants to hold off (doesn't want insulin, etc) Needs formal help---like  Weight Watchers Will give him 3 months more   Vitamin B12 deficiency 06/16/2017   Does take supplement daily. Will check with full labs at next visit    Past Surgical History:  Procedure Laterality Date   CARDIAC CATHETERIZATION     [facility] - myocard perf and echo done as well - all normal- EF 48-55% 2019 April and june 2019   COLONOSCOPY  2008   FINGER SURGERY  2013   infection 2nd finger left hand   POLYPECTOMY     REPAIR FLEXOR TENDON HAND Right 08/2016    Family History  Problem Relation Age of Onset   Alzheimer's disease Mother    Heart disease Father    COPD Father    Colon  cancer Father 2   Prostate cancer Father    Dementia Father    Colon polyps Brother    Alzheimer's disease Maternal Grandmother    Leukemia Brother    Leukemia Other    Stomach cancer Neg Hx    Esophageal cancer Neg Hx    Rectal cancer Neg Hx     Social History   Socioeconomic History   Marital status: Married    Spouse name: Not on file   Number of children: 1   Years of education: 14   Highest education level: Associate degree: academic program  Occupational History   Occupation: Retired    Comment: EMS, Customer service manager  Tobacco Use   Smoking status: Former    Packs/day: 2.50    Years: 7.00    Total pack years: 17.50    Types: Cigarettes    Quit date: 03/18/1983    Years since quitting: 39.0    Passive exposure: Past   Smokeless tobacco: Never  Vaping Use   Vaping Use: Never used  Substance and Sexual Activity   Alcohol use: Not Currently    Comment: occasional glass of wine   Drug use: No   Sexual activity: Not on file  Other Topics Concern   Not on file  Social History Narrative   No living will or health care POA thus far   Wife, then son, should make decisions. May make son the primary person   Would accept resuscitation attempts but wouldn't want to be on machines   No tube feeds if cognitively unaware   Social Determinants of Health   Financial Resource Strain: Not on  file  Food Insecurity: Not on file  Transportation Needs: Not on file  Physical Activity: Not on file  Stress: Not on file  Social Connections: Not on file  Intimate Partner Violence: Not on file   Review of Systems Sleeps okay Eating okay--but appetite is down Has lost weight over the past year      Objective:   Physical Exam Constitutional:      Appearance: Normal appearance.  Cardiovascular:     Rate and Rhythm: Normal rate and regular rhythm.     Pulses: Normal pulses.     Heart sounds: No murmur heard.    No gallop.  Pulmonary:     Effort: Pulmonary effort is normal.     Breath sounds: Normal breath sounds. No wheezing or rales.  Musculoskeletal:     Cervical back: Neck supple.     Right lower leg: No edema.     Left lower leg: No edema.  Lymphadenopathy:     Cervical: No cervical adenopathy.  Skin:    Comments: No foot lesions  Neurological:     Mental Status: He is alert.     Comments: Normal sensation in feet  Psychiatric:        Mood and Affect: Mood normal.        Behavior: Behavior normal.            Assessment & Plan:

## 2022-03-18 NOTE — Assessment & Plan Note (Signed)
BP Readings from Last 3 Encounters:  03/18/22 (!) 86/58  03/06/22 132/88  01/30/22 108/60   Has low bp-----so will stop the olmesartan/HCTZ completely Consider dropping the tansulosin to daily if still spells  Has unusual head sensation and diplopia with the dizziness---needs to review this with Dr Delice Lesch

## 2022-03-21 ENCOUNTER — Other Ambulatory Visit: Payer: Self-pay | Admitting: Internal Medicine

## 2022-03-24 ENCOUNTER — Ambulatory Visit
Admission: RE | Admit: 2022-03-24 | Discharge: 2022-03-24 | Disposition: A | Discharge status: Home / Self Care | Payer: Medicare HMO | Source: Ambulatory Visit | Attending: Neurology | Admitting: Neurology

## 2022-03-24 DIAGNOSIS — R42 Dizziness and giddiness: Secondary | ICD-10-CM

## 2022-03-24 DIAGNOSIS — I6503 Occlusion and stenosis of bilateral vertebral arteries: Secondary | ICD-10-CM | POA: Diagnosis not present

## 2022-03-24 MED ORDER — GADOPICLENOL 0.5 MMOL/ML IV SOLN
10.0000 mL | Freq: Once | INTRAVENOUS | Status: AC | PRN
  Administered 2022-03-24: 10 mL via INTRAVENOUS

## 2022-04-01 ENCOUNTER — Encounter: Payer: Self-pay | Admitting: Neurology

## 2022-04-02 ENCOUNTER — Encounter: Payer: Self-pay | Admitting: Neurology

## 2022-04-02 ENCOUNTER — Ambulatory Visit: Payer: Medicare HMO | Admitting: Neurology

## 2022-04-02 VITALS — BP 127/69 | HR 105 | Ht 77.0 in | Wt 241.2 lb

## 2022-04-02 DIAGNOSIS — R42 Dizziness and giddiness: Secondary | ICD-10-CM

## 2022-04-02 DIAGNOSIS — I679 Cerebrovascular disease, unspecified: Secondary | ICD-10-CM | POA: Diagnosis not present

## 2022-04-02 DIAGNOSIS — G3184 Mild cognitive impairment, so stated: Secondary | ICD-10-CM

## 2022-04-02 NOTE — Patient Instructions (Signed)
Good to see you.  Referral will be sent for Vestibular therapy for Gaze stabilization exercises  2. Start daily aspirin '81mg'$  tablet  3. Please call the CPAP provider to get started on treating severe sleep apnea  4. Schedule repeat Neurocognitive testing  5. Continue control of blood pressure, cholesterol, sugar levels  6. Monitor BP when lying down, sitting, then standing. If still going down even off BP medication, increase water intake (8 glasses daily) and use compression stockings  7. Follow-up in 4-5 months, call for any changes

## 2022-04-02 NOTE — Progress Notes (Signed)
NEUROLOGY FOLLOW UP OFFICE NOTE  Alejandro Koch [id number] 04-28-1950  HISTORY OF PRESENT ILLNESS: I had the pleasure of seeing Alejandro Koch in follow-up in the neurology clinic on 04/02/2022.  The patient was last seen 2 months ago for memory loss and dizziness. He is again accompanied by his wife [name] who helps supplement the history today.  Records and images were personally reviewed where available.  I personally reviewed brain MRI without contrast done 02/2022 which did not show any acute changes, there was mild diffuse atrophy, minimal chronic microvascular disease, right maxillary sinusitis. He had an MRA head and neck on 03/24/22 which showed focal severe proximal left P2 stenosis, otherwise patent distally, short-segment mild moderate distal right P2 stenosis, patent to distal aspects. There was a dominant left vertebral artery widely patent to the vertebrobasilar junction, hypoplastic right vertebral artery, both PICA patent. Basilar patent to distal aspect without stenosis.   Since his last visit, they report daily episodes of dizziness. On further questioning, he reports that he feels it more when he extends his neck/puts head back or when he is looking up. He would feel a little dizzy, he flexes his neck down and waits a minute and symptoms go away. It can happen when he is sitting and sits back on his recliner or when watching TV, he would bend head down and feel better. Vision still gets blurred sometimes, no loss of peripheral vision. No headache/head pain, but he has occasional episodes of a sensation localized over the vertex where he feels numbness and burning, not clearly associated with the dizziness. He has seen Sleep Medicine for severe OSA, he agreed to restart CPAP but has not called DME company for his machine. He has not started the aspirin. His wife has noticed a big change in his memory in the past month. 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 He gets everything confused, his desk is a mess. He forgets where he puts things. Benicar is no longer on his list, he has been taking 1/4 tablet daily but will stop it now that pharmacy reported he has no refills.    History on Initial Assessment 05/15/2020: This is a 72 year old right-handed man with a history of hypertension, hyperlipidemia, OSA, diabetes, anxiety, depression, presenting for evaluation of memory loss. He feels hie memory is not like it's supposed to be, he used to be pretty sharp and great with numbers. He lives with his wife. His wife started noticing changes about a year ago, he started having mood changes, a lot of edgy when he is usually even-keel. He was losing things frequently and appetite was not like it was. He was losing weight and reporting dizziness. He would repeat himself and get frustrated easily. She noticed his attention span would not be longer than 10-15 minutes, he would get easily distracted. 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 He has occasional word-finding difficulties when he used to be pretty accurate. He denies getting lost driving but has trouble remembering directions to the beach and questions himself more now. He denies missing medications or bill payments. His mother had Alzheimer's disease, his father had memory issues. He rarely drinks alcohol. He had a fall last Christmas where he lost consciousness. No neurosurgical procedures. He thinks his mood is better, he was in a very stressful work environment but did notice he used to be more tolerant but is  now more irritable. His wife denies any paranoia or hallucinations.  He states he never had headaches in the past, but over the last 3 months he has had a low grade frontal headache that would occur infrequently. No associated nausea/vomiting. He has dizziness when sitting but more when standing. He describes a spinning sensation where he has to hold on for a  few seconds to get his bearings, no falls. He denies any diplopia, dysarthria/dysphagia, neck/back pain, focal numbness/tingling/weakness, bowel/bladder dysfunction, anosmia, or tremors. Sleep is good with Xanax every night.   Diagnostic Data: MRI brain without contrast done 02/2020 did not show any acute changes, there was mild diffuse atrophy.  Neuropsychological testing in 05/2020: diagnosis of Mild Neurocognitive Disorder, etiology unclear. Findings were not consistent with Alzheimer's disease, most likely culprit felt to be a combination of sleep dysfunction, Xanax side effects, and cardiovascular disease, various psychosocial stressors.  Laboratory Data: Lab Results  Component Value Date   TSH 2.24 06/09/2017   Lab Results  Component Value Date   [id number] 08/29/2020    PAST MEDICAL HISTORY: Past Medical History:  Diagnosis Date   Abdominal discomfort, epigastric 06/10/2017   Actinic keratoses 05/12/2014   Acute chest pain 12/20/2007   Acute prostatitis 06/10/2017   Classic presentation; will treat with sulfa x 2 weeks. He did well with this back in March   Allergic rhinitis 09/30/2006   Anemia    Benign essential hypertension 09/30/2006   BP Readings from Last 3 Encounters: 06/16/17 138/72  06/09/17 118/66  12/09/16 136/82   Reasonable control   Blood transfusion without reported diagnosis    40 yrs ago    BPH with obstruction/lower urinary tract symptoms 12/27/2018   Still with mild symptoms. If worsens, will need to go back to the urologist   CHF (congestive heart failure)    Chronic cough 07/16/2018   Fits definition of chronic bronchitis except not 2 years in a row. Seeing the pulmonary doctor. Doesn't feel the breo has helped   Extensor tendon laceration of right hand with open wound 09/16/2016   Fatigue 06/10/2017   Fatty liver 03/01/2010   LFTs normal. Still needs more weight loss   GERD (gastroesophageal reflux disease) 09/30/2006   Tried omeprazole and  symptoms all came back. Will order the nexium again   Glaucoma    Hyperlipemia 09/30/2006   Inconsistent with the atorvastatin. Discussed---will take it twice a week   Hyperlipidemia    Hypogonadism in male 09/19/2009   Kidney stones    Laryngopharyngeal reflux (LPR) 06/03/2016   Left foot pain 09/20/2019   Major depressive disorder    Mild neurocognitive disorder 05/18/2020   OSA (obstructive sleep apnea) 09/30/2006   NPSG 07/2014:  AHI 23/hr, PLMS 452 with 12/hr with A/A NPSG 07/2014:  AHI 23/hr, PLMS 452 with 12/hr with A/A. The patient has moderate obstructive sleep apnea by his recent study, and is very symptomatic during the day. I have discussed with him the various treatment options, and have recommended a trial of C P   Shortness of breath 05/21/2015   09/11/17 Right and Left Heart cath: Right radial and brachial access.Left dominant coronary system with no CAD RHC with some exercise showed RA 15, 14 mm Hg (mean 12)PA 37/14  mean 27,PWP 17,15 mean 16   Type 2 diabetes mellitus with neurological manifestations, uncontrolled 03/05/2009   HGBA1C 8.7 (A) 03/30/2018. Not much better. I want to send him to endocrine--he wants to hold off (doesn't want insulin, etc) Needs  formal help---like Weight [employer] Will give him 3 months more   Vitamin B12 deficiency 06/16/2017   Does take supplement daily. Will check with full labs at next visit    MEDICATIONS: Current Outpatient Medications on File Prior to Visit  Medication Sig Dispense Refill   ALPRAZolam (XANAX) 0.5 MG tablet TAKE 1 TABLET BY MOUTH THREE TIMES A DAY AS NEEDED 90 tablet 0   Blood Glucose Calibration (ONETOUCH VERIO) SOLN AS DIRECTED 1 each 1   Blood Glucose Monitoring Suppl (ONETOUCH VERIO FLEX SYSTEM) w/Device KIT Use as directed,once daily to test blood sugar 1 kit 0   BYDUREON BCISE 2 MG/0.85ML AUIJ INJECT 2 MG INTO THE SKIN ONCE A WEEK. 10.2 mL 11   dapagliflozin propanediol (FARXIGA) 10 MG TABS tablet Take 1 tablet (10 mg  total) by mouth daily before breakfast. 90 tablet 3   esomeprazole (NEXIUM) 20 MG capsule Take 40 mg by mouth 2 (two) times daily before a meal.     finasteride (PROSCAR) 5 MG tablet TAKE 1 TABLET BY MOUTH EVERY DAY (Patient taking differently: Take 10 mg by mouth daily.) 90 tablet 3   glipiZIDE (GLUCOTROL XL) 10 MG 24 hr tablet TAKE 1 TABLET (10 MG TOTAL) BY MOUTH DAILY WITH BREAKFAST. 90 tablet 3   glucose blood (ONETOUCH VERIO) test strip CHECK BLOOD SUGAR EVERY DAY. E11.42 50 strip 11   hydrocortisone 2.5 % cream APPLY TOPICALLY 3 TIMES DAILY AS NEEDED. 28 g 3   ketorolac (ACULAR) 0.5 % ophthalmic solution USE AS DIRECTED 5 mL 1   metFORMIN (GLUCOPHAGE-XR) 500 MG 24 hr tablet Take 2 tablets (1,000 mg total) by mouth in the morning and at bedtime. 360 tablet 3   Meth-Hyo-M Bl-Na Phos-Ph Sal (URO-MP) 118 MG CAPS Take 1 capsule by mouth every 6 (six) hours as needed.     nitroGLYCERIN (NITROSTAT) 0.4 MG SL tablet Place under the tongue.     ONETOUCH DELICA LANCETS FINE MISC 1 Units by Does not apply route daily. 100 each 2   rosuvastatin (CRESTOR) 10 MG tablet TAKE 1 TABLET BY MOUTH EVERY DAY 90 tablet 3   tamsulosin (FLOMAX) 0.4 MG CAPS capsule Take 2 capsules (0.8 mg total) by mouth daily. Patient reports taking 2 0.'4mg'$  capsules. 180 capsule 3   urea (CARMOL) 40 % CREA APPLY PEA SIZED AMOUNT TO HARD AREA BELOW PINKY TOE TWICE A DAY 28.35 g 1   vitamin B-12 (CYANOCOBALAMIN) 1000 MCG tablet Take 1 tablet (1,000 mcg total) by mouth daily.     No current facility-administered medications on file prior to visit.    ALLERGIES: Allergies  Allergen Reactions   Iohexol      Code: HIVES, Desc: PT IS ALLERGIC TO IVP DYE AS NOTED IN PREVIOUS RECORDS IN PACS 02/2009/RM, Onset Date: [id number]    Atorvastatin Other (See Comments)    Myalgias with this and rosuvastin    FAMILY HISTORY: Family History  Problem Relation Age of Onset   Alzheimer's disease Mother    Heart disease Father    COPD  Father    Colon cancer Father 48   Prostate cancer Father    Dementia Father    Colon polyps Brother    Alzheimer's disease Maternal Grandmother    Leukemia Brother    Leukemia Other    Stomach cancer Neg Hx    Esophageal cancer Neg Hx    Rectal cancer Neg Hx     SOCIAL HISTORY: Social History   Socioeconomic History   Marital status:  Married    Spouse name: Not on file   Number of children: 1   Years of education: 14   Highest education level: Associate degree: academic program  Occupational History   Occupation: Retired    Comment: EMS, Customer service manager  Tobacco Use   Smoking status: Former    Packs/day: 2.50    Years: 7.00    Total pack years: 17.50    Types: Cigarettes    Quit date: 03/18/1983    Years since quitting: 39.0    Passive exposure: Past   Smokeless tobacco: Never  Vaping Use   Vaping Use: Never used  Substance and Sexual Activity   Alcohol use: Not Currently    Comment: occasional glass of wine   Drug use: No   Sexual activity: Not on file  Other Topics Concern   Not on file  Social History Narrative   No living will or health care POA thus far   Wife, then son, should make decisions. May make son the primary person   Would accept resuscitation attempts but wouldn't want to be on machines   No tube feeds if cognitively unaware   Social Determinants of Health   Financial Resource Strain: Not on file  Food Insecurity: Not on file  Transportation Needs: Not on file  Physical Activity: Not on file  Stress: Not on file  Social Connections: Not on file  Intimate Partner Violence: Not on file     PHYSICAL EXAM: Vitals:   04/02/22 1105  BP: 127/69  Pulse: (!) 105  SpO2: 98%   General: No acute distress Head:  Normocephalic/atraumatic Skin/Extremities: No rash, no edema Neurological Exam: alert and awake. No aphasia or dysarthria. Fund of knowledge is appropriate.  Attention and concentration are normal.   Cranial nerves: Pupils equal, round.  Extraocular movements intact. No facial asymmetry.  Motor:moves all extremities symmetrically, at least anti-gravity x 4. Gait narrow-based and steady, no ataxia.   IMPRESSION: This is a 72 yo RH man with a history of hypertension, hyperlipidemia, OSA, diabetes, anxiety, depression, with dizziness and Mild Neurocognitive disorder, etiology unclear.   Mild Cognitive Impairment. MRI brain in 2021 and 02/2022 no acute changes, there is mild diffuse atrophy, no significant chronic microvascular disease. Cognitive profile on testing in 2022 was not consistent with AD and most likely culprit felt due to combination of sleep dysfunction, Xanax side effects, cardiovascular disease, and various psychosocial stressors. Sleep study in June 2023 showed severe sleep apnea, he has seen Sleep Medicine but has not called to get CPAP. He was encouraged to restart CPAP. Once OSA is better controlled, we discussed repeating Neuropsychological testing.   2. Dizziness, positional. MRA head and neck showed focal stenosis in bilateral P2 segments proximally but flow was seen distally. There was no vertebrobasilar insufficiency seen. P2 stenosis would not cause dizziness. Discussed that since dizziness is quite positional, would resume Vestibular therapy with gaze stabilization exercises. Start daily aspirin '81mg'$  and continue control of vascular risk factors for intracranial stenosis. Continue to monitor BP, he is still taking 1/4 tab of Benicar and was instructed to stop it as per PCP instructions. Increase hydration. If BP still low despite stopping medication, advised to use compression stockings.   Follow-up in 4-5 months, call for any changes.     Thank you for allowing me to participate in his care.  Please do not hesitate to call for any questions or concerns.    Ellouise Newer, M.D.   CC: Dr. [name]

## 2022-04-04 ENCOUNTER — Telehealth: Payer: Self-pay | Admitting: Nurse Practitioner

## 2022-04-04 NOTE — Telephone Encounter (Signed)
Pt called to cancel upcoming appt 1/24 as he has not received his cpap machine yet. Order was placed 11/15 fo rpt to receive cpap machine and he said he has not heard anything about this.  PCCs, is there any way you can help [place] out with this?

## 2022-04-04 NOTE — Telephone Encounter (Signed)
Called [facility] and spoke to [place].  She stating they had made several attempts to contact patient and were unable to get up with him so order was voided.  She states pt can call them and they will be able to set him up.  I called pt & made him aware.  He states he will call them now.  Nothing further needed.

## 2022-04-07 ENCOUNTER — Ambulatory Visit: Payer: Medicare HMO | Attending: Neurology

## 2022-04-07 DIAGNOSIS — R2681 Unsteadiness on feet: Secondary | ICD-10-CM | POA: Diagnosis not present

## 2022-04-07 DIAGNOSIS — R42 Dizziness and giddiness: Secondary | ICD-10-CM | POA: Insufficient documentation

## 2022-04-07 NOTE — Therapy (Signed)
OUTPATIENT PHYSICAL THERAPY VESTIBULAR EVALUATION     Patient Name: Alejandro Koch MRN: [id number] DOB:01/22/1951, 72 y.o., male Today's Date: 04/07/2022  END OF SESSION:  PT End of Session - 04/07/22 1403     Visit Number 1    Number of Visits 9    Date for PT Re-Evaluation 05/12/22    Authorization Type [employer]    PT Start Time 1401    PT Stop Time 1450    PT Time Calculation (min) 49 min    Activity Tolerance Patient tolerated treatment well    Behavior During Therapy [facility] for tasks assessed/performed             Past Medical History:  Diagnosis Date   Abdominal discomfort, epigastric 06/10/2017   Actinic keratoses 05/12/2014   Acute chest pain 12/20/2007   Acute prostatitis 06/10/2017   Classic presentation; will treat with sulfa x 2 weeks. He did well with this back in March   Allergic rhinitis 09/30/2006   Anemia    Benign essential hypertension 09/30/2006   BP Readings from Last 3 Encounters: 06/16/17 138/72  06/09/17 118/66  12/09/16 136/82   Reasonable control   Blood transfusion without reported diagnosis    40 yrs ago    BPH with obstruction/lower urinary tract symptoms 12/27/2018   Still with mild symptoms. If worsens, will need to go back to the urologist   CHF (congestive heart failure)    Chronic cough 07/16/2018   Fits definition of chronic bronchitis except not 2 years in a row. Seeing the pulmonary doctor. Doesn't feel the breo has helped   Extensor tendon laceration of right hand with open wound 09/16/2016   Fatigue 06/10/2017   Fatty liver 03/01/2010   LFTs normal. Still needs more weight loss   GERD (gastroesophageal reflux disease) 09/30/2006   Tried omeprazole and symptoms all came back. Will order the nexium again   Glaucoma    Hyperlipemia 09/30/2006   Inconsistent with the atorvastatin. Discussed---will take it twice a week   Hyperlipidemia    Hypogonadism in male 09/19/2009   Kidney stones    Laryngopharyngeal reflux (LPR)  06/03/2016   Left foot pain 09/20/2019   Major depressive disorder    Mild neurocognitive disorder 05/18/2020   OSA (obstructive sleep apnea) 09/30/2006   NPSG 07/2014:  AHI 23/hr, PLMS 452 with 12/hr with A/A NPSG 07/2014:  AHI 23/hr, PLMS 452 with 12/hr with A/A. The patient has moderate obstructive sleep apnea by his recent study, and is very symptomatic during the day. I have discussed with him the various treatment options, and have recommended a trial of C P   Shortness of breath 05/21/2015   09/11/17 Right and Left Heart cath: Right radial and brachial access.Left dominant coronary system with no CAD RHC with some exercise showed RA 15, 14 mm Hg (mean 12)PA 37/14  mean 27,PWP 17,15 mean 16   Type 2 diabetes mellitus with neurological manifestations, uncontrolled 03/05/2009   HGBA1C 8.7 (A) 03/30/2018. Not much better. I want to send him to endocrine--he wants to hold off (doesn't want insulin, etc) Needs formal help---like Weight [employer] Will give him 3 months more   Vitamin B12 deficiency 06/16/2017   Does take supplement daily. Will check with full labs at next visit   Past Surgical History:  Procedure Laterality Date   CARDIAC CATHETERIZATION     [facility] - myocard perf and echo done as well - all normal- EF 48-55% 2019 April and june 2019  COLONOSCOPY  2008   FINGER SURGERY  2013   infection 2nd finger left hand   POLYPECTOMY     REPAIR FLEXOR TENDON HAND Right 08/2016   Patient Active Problem List   Diagnosis Date Noted   Skin lesion 11/06/2021   Preventative health care 03/01/2021   Mood disorder ([place]) 03/01/2021   Mild neurocognitive disorder 05/18/2020   Major depressive disorder    BPH with obstruction/lower urinary tract symptoms 12/27/2018   Vitamin B12 deficiency 06/16/2017   Laryngopharyngeal reflux (LPR) 06/03/2016   Anemia 07/23/2015   Fatty liver 03/01/2010   Type 2 DM with diabetic neuropathy affecting both sides of body ([place]) 03/05/2009   Hyperlipemia 09/30/2006    Benign essential hypertension 09/30/2006   Allergic rhinitis 09/30/2006   GERD (gastroesophageal reflux disease) 09/30/2006   OSA (obstructive sleep apnea) 09/30/2006    PCP: Viviana Simpler, MD  REFERRING PROVIDER: Cameron Sprang, MD  REFERRING DIAG: R42 (ICD-10-CM) - Dizziness  THERAPY DIAG:  Dizziness and giddiness  Unsteadiness on feet  ONSET DATE: ~6 months ago  Rationale for Evaluation and Treatment: Rehabilitation  SUBJECTIVE:   SUBJECTIVE STATEMENT: Patient arrives to clinic with wife. Initially stating he's here for memory loss and confusion. Wife clarifying he's here for dizziness. Reports sudden onset, effects his vision (seeing double) at times. "Funny sensation" down the center of his head, but no pain. Denies any pain/migraine associated with dizziness. Frequency seems to be increasing. Reports needing to catch himself multiple times- denies passing out, but has "slid down a wall" multiple times. Has not seen eye dr regarding double vision. Patient reports that extending neck seems to bring on his symptoms.  Pt accompanied by: significant other  PERTINENT HISTORY: hypertension, hyperlipidemia, OSA, diabetes, anxiety, depression, distant history of C4/C5 fx  PAIN:  Are you having pain? No  PRECAUTIONS: Fall  WEIGHT BEARING RESTRICTIONS: No  FALLS: Has patient fallen in last 6 months? Yes. Number of falls 3; all related to dizziness  LIVING ENVIRONMENT: Lives with: lives with their spouse Lives in: House/apartment Stairs: Yes: External: 4-5 steps; on right going up Has following equipment at home: Single point cane and Grab bars  PLOF: Independent; driving   PATIENT GOALS: "figure out why I'm dizzy"   OBJECTIVE:   DIAGNOSTIC FINDINGS: MRA head 1/8: mild diffuse atrophy, minimal chronic microvascular disease, right maxillary sinusitis. He had an MRA head and neck on 03/24/22 which showed focal severe proximal left P2 stenosis, otherwise patent distally,  short-segment mild moderate distal right P2 stenosis, patent to distal aspects. There was a dominant left vertebral artery widely patent to the vertebrobasilar junction, hypoplastic right vertebral artery, both PICA patent. Basilar patent to distal aspect without stenosis.  COGNITION: Overall cognitive status: History of cognitive impairments - at baseline   SENSATION: WFL  POSTURE:  No Significant postural limitations  Cervical ROM:   All WFL Active A/PROM (deg) eval  Flexion   Extension   Right lateral flexion   Left lateral flexion   Right rotation   Left rotation   (Blank rows = not tested) VBI: clear STRENGTH: WFL  BED MOBILITY:  Reports no difficulty or increase in dizziness   GAIT: Gait pattern: ataxic and narrow BOS Distance walked: clinic   PATIENT SURVEYS:  FOTO staff did not capture   VESTIBULAR ASSESSMENT:  GENERAL OBSERVATION: NAD   SYMPTOM BEHAVIOR:  Subjective history: see above  Non-Vestibular symptoms: diplopia  Type of dizziness: Diplopia, Imbalance (Disequilibrium), and Lightheadedness/Faint  Frequency: daily  Duration: as  long as he's doing provoking activity   Aggravating factors: Induced by motion: looking up at the ceiling and Occurs when standing still   Relieving factors: head stationary and rest  Progression of symptoms: worse  OCULOMOTOR EXAM:  Ocular Alignment: normal  Ocular ROM: No Limitations  Spontaneous Nystagmus: absent  Gaze-Induced Nystagmus: absent  Smooth Pursuits: intact  Saccades: intact  Convergence/Divergence: <5 cm   VESTIBULAR - OCULAR REFLEX:   Slow VOR: Normal  VOR Cancellation: Corrective Saccades  Head-Impulse Test: HIT Right: positive HIT Left: positive  Dynamic Visual Acuity: to be assessed  Test of skew: (-) bilaterally    POSITIONAL TESTING: not indicated at this time  MOTION SENSITIVITY:  Motion Sensitivity Quotient Intensity: 0 = none, 1 = Lightheaded, 2 = Mild, 3 = Moderate, 4 = Severe, 5 =  Vomiting  Intensity  1. Sitting to supine   2. Supine to L side   3. Supine to R side   4. Supine to sitting   5. L Hallpike-Dix   6. Up from L    7. R Hallpike-Dix   8. Up from R    9. Sitting, head tipped to L knee   10. Head up from L knee   11. Sitting, head tipped to R knee   12. Head up from R knee   13. Sitting head turns x5   14.Sitting head nods x5   15. In stance, 180 turn to L    16. In stance, 180 turn to R      VESTIBULAR TREATMENT:                                                                                                   N/a eval    PATIENT EDUCATION: Education details: PT POC, exam findings Person educated: Patient and Spouse Education method: Explanation Education comprehension: verbalized understanding and needs further education  HOME EXERCISE PROGRAM:  GOALS: Goals reviewed with patient? Yes  SHORT TERM GOALS: = LTG based on POC length   LONG TERM GOALS: Target date: 05/12/22  Pt will be independent with final HEP for improved symptom report  Baseline: to be provided  Goal status: INITIAL  2.  FOTO goal Baseline: to be assessed Goal status: INITIAL  3.  MSQ goal Baseline: to be assessed Goal status: INITIAL  4.  M-CTSIB goal Baseline: to be assessed Goal status: INITIAL  5.  FGA goal Baseline: to be assessed Goal status: INITIAL   6. DVA goal    Baseline: to be assessed   Goal status: INITIAL ASSESSMENT:  CLINICAL IMPRESSION: Patient is a 72 y.o. male who was seen today for physical therapy evaluation and treatment for dizziness. He does have a history of narrowed R vertebral artery, which may be contributing to his dizziness, especially since he reports increased symptoms with cervical extension. He does present with bilateral (+) HIT, but (-) B test of skew. Impaired VOR cancellation as well, but reports no difficulties with driving or being the passenger in a car. Patient likely with a combination of causes for his  dizziness and willing  to trial PT to minimize his symptoms and limit his risk for falling. He would benefit from skilled PT services to address the above mentioned deficits.    OBJECTIVE IMPAIRMENTS: Abnormal gait, decreased balance, decreased cognition, decreased knowledge of condition, and dizziness.   ACTIVITY LIMITATIONS: bending, sitting, standing, reach over head, and caring for others  PARTICIPATION LIMITATIONS: cleaning, interpersonal relationship, shopping, community activity, occupation, and yard work  PERSONAL FACTORS: Behavior pattern, Past/current experiences, Time since onset of injury/illness/exacerbation, Transportation, and 3+ comorbidities: MCI, previous Cervical fx, R vertebral artery narrowing  are also affecting patient's functional outcome.   REHAB POTENTIAL: Fair time since onset, unknown etiology   CLINICAL DECISION MAKING: Evolving/moderate complexity  EVALUATION COMPLEXITY: Moderate   PLAN:  PT FREQUENCY: 2x/week  PT DURATION: 4 weeks  PLANNED INTERVENTIONS: Therapeutic exercises, Therapeutic activity, Neuromuscular re-education, Balance training, Gait training, Patient/Family education, Self Care, Joint mobilization, Stair training, Vestibular training, Canalith repositioning, Visual/preceptual remediation/compensation, DME instructions, Aquatic Therapy, Manual therapy, and Re-evaluation  PLAN FOR NEXT SESSION: HEP, MSQ, FOTO, FGA, DVA   Debbora Dus, PT Debbora Dus, PT, DPT, CBIS  04/07/2022, 2:53 PM

## 2022-04-09 ENCOUNTER — Ambulatory Visit: Payer: Medicare HMO | Admitting: Nurse Practitioner

## 2022-04-11 DIAGNOSIS — G4733 Obstructive sleep apnea (adult) (pediatric): Secondary | ICD-10-CM | POA: Diagnosis not present

## 2022-04-15 ENCOUNTER — Ambulatory Visit: Payer: Medicare HMO

## 2022-04-15 DIAGNOSIS — R2681 Unsteadiness on feet: Secondary | ICD-10-CM | POA: Diagnosis not present

## 2022-04-15 DIAGNOSIS — R42 Dizziness and giddiness: Secondary | ICD-10-CM

## 2022-04-15 NOTE — Therapy (Signed)
OUTPATIENT PHYSICAL THERAPY VESTIBULAR TREATMENT     Patient Name: Alejandro Koch MRN: [id number] DOB:11-19-1950, 72 y.o., male Today's Date: 04/15/2022  END OF SESSION:  PT End of Session - 04/15/22 1449     Visit Number 2    Number of Visits 9    Date for PT Re-Evaluation 05/12/22    Authorization Type [employer] Medicare    PT Start Time 1447    PT Stop Time 1528    PT Time Calculation (min) 41 min    Activity Tolerance Patient tolerated treatment well    Behavior During Therapy [facility] for tasks assessed/performed             Past Medical History:  Diagnosis Date   Abdominal discomfort, epigastric 06/10/2017   Actinic keratoses 05/12/2014   Acute chest pain 12/20/2007   Acute prostatitis 06/10/2017   Classic presentation; will treat with sulfa x 2 weeks. He did well with this back in March   Allergic rhinitis 09/30/2006   Anemia    Benign essential hypertension 09/30/2006   BP Readings from Last 3 Encounters: 06/16/17 138/72  06/09/17 118/66  12/09/16 136/82   Reasonable control   Blood transfusion without reported diagnosis    40 yrs ago    BPH with obstruction/lower urinary tract symptoms 12/27/2018   Still with mild symptoms. If worsens, will need to go back to the urologist   CHF (congestive heart failure)    Chronic cough 07/16/2018   Fits definition of chronic bronchitis except not 2 years in a row. Seeing the pulmonary doctor. Doesn't feel the breo has helped   Extensor tendon laceration of right hand with open wound 09/16/2016   Fatigue 06/10/2017   Fatty liver 03/01/2010   LFTs normal. Still needs more weight loss   GERD (gastroesophageal reflux disease) 09/30/2006   Tried omeprazole and symptoms all came back. Will order the nexium again   Glaucoma    Hyperlipemia 09/30/2006   Inconsistent with the atorvastatin. Discussed---will take it twice a week   Hyperlipidemia    Hypogonadism in male 09/19/2009   Kidney stones    Laryngopharyngeal reflux (LPR)  06/03/2016   Left foot pain 09/20/2019   Major depressive disorder    Mild neurocognitive disorder 05/18/2020   OSA (obstructive sleep apnea) 09/30/2006   NPSG 07/2014:  AHI 23/hr, PLMS 452 with 12/hr with A/A NPSG 07/2014:  AHI 23/hr, PLMS 452 with 12/hr with A/A. The patient has moderate obstructive sleep apnea by his recent study, and is very symptomatic during the day. I have discussed with him the various treatment options, and have recommended a trial of C P   Shortness of breath 05/21/2015   09/11/17 Right and Left Heart cath: Right radial and brachial access.Left dominant coronary system with no CAD RHC with some exercise showed RA 15, 14 mm Hg (mean 12)PA 37/14  mean 27,PWP 17,15 mean 16   Type 2 diabetes mellitus with neurological manifestations, uncontrolled 03/05/2009   HGBA1C 8.7 (A) 03/30/2018. Not much better. I want to send him to endocrine--he wants to hold off (doesn't want insulin, etc) Needs formal help---like [employer] Will give him 3 months more   Vitamin B12 deficiency 06/16/2017   Does take supplement daily. Will check with full labs at next visit   Past Surgical History:  Procedure Laterality Date   CARDIAC CATHETERIZATION     [facility] - myocard perf and echo done as well - all normal- EF 48-55% 2019 April and june 2019  COLONOSCOPY  2008   FINGER SURGERY  2013   infection 2nd finger left hand   POLYPECTOMY     REPAIR FLEXOR TENDON HAND Right 08/2016   Patient Active Problem List   Diagnosis Date Noted   Skin lesion 11/06/2021   Preventative health care 03/01/2021   Mood disorder ([place]) 03/01/2021   Mild neurocognitive disorder 05/18/2020   Major depressive disorder    BPH with obstruction/lower urinary tract symptoms 12/27/2018   Vitamin B12 deficiency 06/16/2017   Laryngopharyngeal reflux (LPR) 06/03/2016   Anemia 07/23/2015   Fatty liver 03/01/2010   Type 2 DM with diabetic neuropathy affecting both sides of body ([place]) 03/05/2009   Hyperlipemia 09/30/2006    Benign essential hypertension 09/30/2006   Allergic rhinitis 09/30/2006   GERD (gastroesophageal reflux disease) 09/30/2006   OSA (obstructive sleep apnea) 09/30/2006    PCP: Viviana Simpler, MD  REFERRING PROVIDER: Cameron Sprang, MD  REFERRING DIAG: R42 (ICD-10-CM) - Dizziness  THERAPY DIAG:  Dizziness and giddiness  Unsteadiness on feet  ONSET DATE: ~6 months ago  Rationale for Evaluation and Treatment: Rehabilitation  SUBJECTIVE:   SUBJECTIVE STATEMENT: Patient arrives to clinic alone, drove himself here. Denies falls/near falls. Still with dizziness, including random onset while watching TV.  Pt accompanied by: significant other  PERTINENT HISTORY: hypertension, hyperlipidemia, OSA, diabetes, anxiety, depression, distant history of C4/C5 fx  PAIN:  Are you having pain? No  PRECAUTIONS: Fall  PATIENT GOALS: "figure out why I'm dizzy"   PATIENT SURVEYS:  FOTO 58; expected to be at 43  VESTIBULAR ASSESSMENT:  MOTION SENSITIVITY:  Motion Sensitivity Quotient Intensity: 0 = none, 1 = Lightheaded, 2 = Mild, 3 = Moderate, 4 = Severe, 5 = Vomiting  Intensity  1. Sitting to supine 0  2. Supine to L side 0  3. Supine to R side 0  4. Supine to sitting 0  5. L Hallpike-Dix   6. Up from L    7. R Hallpike-Dix   8. Up from R    9. Sitting, head tipped to L knee 0  10. Head up from L knee 0  11. Sitting, head tipped to R knee 0  12. Head up from R knee 0  13. Sitting head turns x5 0  14.Sitting head nods x5 0  15. In stance, 180 turn to L  0  16. In stance, 180 turn to R 0   M-CTSIB:  Condition 1: 30s normal sway  Condition 2: 30s mild sway  Condition 3: 30s mild sway  Condition 4: 30s moderate sway   VESTIBULAR TREATMENT:                                                                                                     PATIENT EDUCATION: Education details: initial HEP, exam findings Person educated: Patient and Spouse Education method:  Explanation Education comprehension: verbalized understanding and needs further education  HOME EXERCISE PROGRAM: Access Code: [email or web address] URL: [email or web address] Date: 04/15/2022 Prepared by: Estevan Ryder  Exercises - Standing Balance in Corner  - 1 x daily - 7 x weekly -  3 sets - 30s hold - Standing Balance in Corner with Eyes Closed  - 1 x daily - 7 x weekly - 3 sets - 30s hold - Corner Balance Feet Together With Eyes Open  - 1 x daily - 7 x weekly - 3 sets - 30s hold - Corner Balance Feet Together With Eyes Closed  - 1 x daily - 7 x weekly - 3 sets - 30s hold - Semi-Tandem Corner Balance With Eyes Open  - 1 x daily - 7 x weekly - 3 sets - 30s hold - Semi-Tandem Corner Balance With Eyes Closed  - 1 x daily - 7 x weekly - 3 sets - 30s hold  GOALS: Goals reviewed with patient? Yes  SHORT TERM GOALS: = LTG based on POC length   LONG TERM GOALS: Target date: 05/12/22  Pt will be independent with final HEP for improved symptom report  Baseline: to be provided  Goal status: INITIAL  2. Patient will improve FOTO score to >/= 62 to demonstrate improved symptom report Baseline: 58 Goal status: INITIAL  3.  Patient will complete 30s in condition 4 of MCTSIB with normal postural sway  Baseline: 30s moderate sway  Goal status: INITIAL  4.  MSQ not indicated due to 0/5 report Baseline: 0/5 Goal status: INITIAL  5.  FGA goal Baseline: not indicated as patient scored a 28/30 Goal status: INITIAL   6. DVA goal    Baseline: to be assessed   Goal status: INITIAL ASSESSMENT:  CLINICAL IMPRESSION: Patient seen for skilled PT session with emphasis on completing vestibular assessment and initiating HEP. PT unable to reproduce dizziness during session, which suggests etiology outside PT scope of practice. Discussed that patient should follow up with ENT and provided him with that information. Patient does demonstrate balance impairment with EC on compliant surfaces and  would benefit from intervention to address that. Continue POC.   OBJECTIVE IMPAIRMENTS: Abnormal gait, decreased balance, decreased cognition, decreased knowledge of condition, and dizziness.   ACTIVITY LIMITATIONS: bending, sitting, standing, reach over head, and caring for others  PARTICIPATION LIMITATIONS: cleaning, interpersonal relationship, shopping, community activity, occupation, and yard work  PERSONAL FACTORS: Behavior pattern, Past/current experiences, Time since onset of injury/illness/exacerbation, Transportation, and 3+ comorbidities: MCI, previous Cervical fx, R vertebral artery narrowing  are also affecting patient's functional outcome.   REHAB POTENTIAL: Fair time since onset, unknown etiology   CLINICAL DECISION MAKING: Evolving/moderate complexity  EVALUATION COMPLEXITY: Moderate   PLAN:  PT FREQUENCY: 2x/week  PT DURATION: 4 weeks  PLANNED INTERVENTIONS: Therapeutic exercises, Therapeutic activity, Neuromuscular re-education, Balance training, Gait training, Patient/Family education, Self Care, Joint mobilization, Stair training, Vestibular training, Canalith repositioning, Visual/preceptual remediation/compensation, DME instructions, Aquatic Therapy, Manual therapy, and Re-evaluation  PLAN FOR NEXT SESSION: EC on compliant surfaces, try VOR?   Debbora Dus, PT Debbora Dus, PT, DPT, CBIS  04/15/2022, 3:30 PM

## 2022-04-17 ENCOUNTER — Ambulatory Visit: Payer: Medicare HMO | Attending: Neurology

## 2022-04-17 DIAGNOSIS — R42 Dizziness and giddiness: Secondary | ICD-10-CM | POA: Insufficient documentation

## 2022-04-17 DIAGNOSIS — R2681 Unsteadiness on feet: Secondary | ICD-10-CM | POA: Insufficient documentation

## 2022-04-17 NOTE — Therapy (Signed)
OUTPATIENT PHYSICAL THERAPY VESTIBULAR TREATMENT     Patient Name: Alejandro Koch MRN: [id number] DOB:09-11-1950, 72 y.o., male Today's Date: 04/17/2022  END OF SESSION:  PT End of Session - 04/17/22 1456     Visit Number 3    Number of Visits 9    Date for PT Re-Evaluation 05/12/22    Authorization Type [employer]    PT Start Time 9675    PT Stop Time 1527    PT Time Calculation (min) 42 min    Activity Tolerance Patient tolerated treatment well    Behavior During Therapy [facility] for tasks assessed/performed             Past Medical History:  Diagnosis Date   Abdominal discomfort, epigastric 06/10/2017   Actinic keratoses 05/12/2014   Acute chest pain 12/20/2007   Acute prostatitis 06/10/2017   Classic presentation; will treat with sulfa x 2 weeks. He did well with this back in March   Allergic rhinitis 09/30/2006   Anemia    Benign essential hypertension 09/30/2006   BP Readings from Last 3 Encounters: 06/16/17 138/72  06/09/17 118/66  12/09/16 136/82   Reasonable control   Blood transfusion without reported diagnosis    40 yrs ago    BPH with obstruction/lower urinary tract symptoms 12/27/2018   Still with mild symptoms. If worsens, will need to go back to the urologist   CHF (congestive heart failure)    Chronic cough 07/16/2018   Fits definition of chronic bronchitis except not 2 years in a row. Seeing the pulmonary doctor. Doesn't feel the breo has helped   Extensor tendon laceration of right hand with open wound 09/16/2016   Fatigue 06/10/2017   Fatty liver 03/01/2010   LFTs normal. Still needs more weight loss   GERD (gastroesophageal reflux disease) 09/30/2006   Tried omeprazole and symptoms all came back. Will order the nexium again   Glaucoma    Hyperlipemia 09/30/2006   Inconsistent with the atorvastatin. Discussed---will take it twice a week   Hyperlipidemia    Hypogonadism in male 09/19/2009   Kidney stones    Laryngopharyngeal reflux (LPR)  06/03/2016   Left foot pain 09/20/2019   Major depressive disorder    Mild neurocognitive disorder 05/18/2020   OSA (obstructive sleep apnea) 09/30/2006   NPSG 07/2014:  AHI 23/hr, PLMS 452 with 12/hr with A/A NPSG 07/2014:  AHI 23/hr, PLMS 452 with 12/hr with A/A. The patient has moderate obstructive sleep apnea by his recent study, and is very symptomatic during the day. I have discussed with him the various treatment options, and have recommended a trial of C P   Shortness of breath 05/21/2015   09/11/17 Right and Left Heart cath: Right radial and brachial access.Left dominant coronary system with no CAD RHC with some exercise showed RA 15, 14 mm Hg (mean 12)PA 37/14  mean 27,PWP 17,15 mean 16   Type 2 diabetes mellitus with neurological manifestations, uncontrolled 03/05/2009   HGBA1C 8.7 (A) 03/30/2018. Not much better. I want to send him to endocrine--he wants to hold off (doesn't want insulin, etc) Needs formal help---like Weight [employer] Will give him 3 months more   Vitamin B12 deficiency 06/16/2017   Does take supplement daily. Will check with full labs at next visit   Past Surgical History:  Procedure Laterality Date   CARDIAC CATHETERIZATION     [facility] - myocard perf and echo done as well - all normal- EF 48-55% 2019 April and june 2019  COLONOSCOPY  2008   FINGER SURGERY  2013   infection 2nd finger left hand   POLYPECTOMY     REPAIR FLEXOR TENDON HAND Right 08/2016   Patient Active Problem List   Diagnosis Date Noted   Skin lesion 11/06/2021   Preventative health care 03/01/2021   Mood disorder ([place]) 03/01/2021   Mild neurocognitive disorder 05/18/2020   Major depressive disorder    BPH with obstruction/lower urinary tract symptoms 12/27/2018   Vitamin B12 deficiency 06/16/2017   Laryngopharyngeal reflux (LPR) 06/03/2016   Anemia 07/23/2015   Fatty liver 03/01/2010   Type 2 DM with diabetic neuropathy affecting both sides of body ([place]) 03/05/2009   Hyperlipemia 09/30/2006    Benign essential hypertension 09/30/2006   Allergic rhinitis 09/30/2006   GERD (gastroesophageal reflux disease) 09/30/2006   OSA (obstructive sleep apnea) 09/30/2006    PCP: Viviana Simpler, MD  REFERRING PROVIDER: Cameron Sprang, MD  REFERRING DIAG: R42 (ICD-10-CM) - Dizziness  THERAPY DIAG:  Dizziness and giddiness  Unsteadiness on feet  ONSET DATE: ~6 months ago  Rationale for Evaluation and Treatment: Rehabilitation  SUBJECTIVE:   SUBJECTIVE STATEMENT: Patient arrives to clinic with wife. Did not complete HEP because he didn't think they were for home. Denies falls/ near falls. Wife and patient with questions regarding progress in therapy/purpose in therapy. Patient and wife under the impression that PT would "increase blood flow to the brain" and were told that neurologist sent them here for that purpose.  Pt accompanied by: significant other  PERTINENT HISTORY: hypertension, hyperlipidemia, OSA, diabetes, anxiety, depression, distant history of C4/C5 fx  PAIN:  Are you having pain? No  PRECAUTIONS: Fall  PATIENT GOALS: "figure out why I'm dizzy"    VESTIBULAR TREATMENT:                                                                                                   -VOR x1 horizontal/vertical x45s x2  -VOR cancellation 2x45s  -Hart Chart horizontal, diagonal  -Smooth pursuits puzzle with pattern background   -increased difficulty with this noted -115' tossing ball with vertical gaze tracking  -patients legs getting crossed when chasing ball he dropped, after PT explicitly instructed him to not chase the ball and that the PT would retrieve it  PATIENT EDUCATION: Education details: continue HEP, purpose of PT Person educated: Patient and Spouse Education method: Explanation Education comprehension: verbalized understanding and needs further education  HOME EXERCISE PROGRAM: Access Code: [email or web address] URL: [email or web address] Date:  04/15/2022 Prepared by: Estevan Ryder  Exercises - Standing Balance in Corner  - 1 x daily - 7 x weekly - 3 sets - 30s hold - Standing Balance in Corner with Eyes Closed  - 1 x daily - 7 x weekly - 3 sets - 30s hold - Corner Balance Feet Together With Eyes Open  - 1 x daily - 7 x weekly - 3 sets - 30s hold - Corner Balance Feet Together With Eyes Closed  - 1 x daily - 7 x weekly - 3 sets - 30s hold - Semi-Tandem Corner Balance With Eyes Open  -  1 x daily - 7 x weekly - 3 sets - 30s hold - Semi-Tandem Corner Balance With Eyes Closed  - 1 x daily - 7 x weekly - 3 sets - 30s hold  GOALS: Goals reviewed with patient? Yes  SHORT TERM GOALS: = LTG based on POC length   LONG TERM GOALS: Target date: 05/12/22  Pt will be independent with final HEP for improved symptom report  Baseline: to be provided  Goal status: INITIAL  2. Patient will improve FOTO score to >/= 62 to demonstrate improved symptom report Baseline: 58 Goal status: INITIAL  3.  Patient will complete 30s in condition 4 of MCTSIB with normal postural sway  Baseline: 30s moderate sway  Goal status: INITIAL  4.  MSQ not indicated due to 0/5 report Baseline: 0/5 Goal status: INITIAL  5.  FGA goal Baseline: not indicated as patient scored a 28/30 Goal status: INITIAL   6. DVA goal    Baseline: to be assessed   Goal status: INITIAL ASSESSMENT:  CLINICAL IMPRESSION: Patient seen for skilled PT session with emphasis on vestibular retraining. PT with difficulty recreating patients dizziness, likely concluding that his dizziness is of central origin. Discussed extensively with patient and wife that PT does not possess ability to increase blood flow to his brain where there are noted occlusions/ areas of stenosis. They verbalized understanding. Patient noted to have difficulty with smooth pursuits and so progressed to ambulatory ball toss with tennis ball. PT instructing patient to not chase ball if it should fall- PT would  retrieve it. Patient dropped the ball and made an attempt to chase it, despite explicit directions to not, and B LE got tangled and he fell. Patient did not hit his head and did not sustain injury- able to get off the floor independently. Verbalized that he was okay. PT did not observe any change in presentation or status. Discussed that should PT still be unsuccessful in reproducing/treating his dizziness, we would refer him back to his neurologist. He verbalized understanding. Continue POC.   OBJECTIVE IMPAIRMENTS: Abnormal gait, decreased balance, decreased cognition, decreased knowledge of condition, and dizziness.   ACTIVITY LIMITATIONS: bending, sitting, standing, reach over head, and caring for others  PARTICIPATION LIMITATIONS: cleaning, interpersonal relationship, shopping, community activity, occupation, and yard work  PERSONAL FACTORS: Behavior pattern, Past/current experiences, Time since onset of injury/illness/exacerbation, Transportation, and 3+ comorbidities: MCI, previous Cervical fx, R vertebral artery narrowing  are also affecting patient's functional outcome.   REHAB POTENTIAL: Fair time since onset, unknown etiology   CLINICAL DECISION MAKING: Evolving/moderate complexity  EVALUATION COMPLEXITY: Moderate   PLAN:  PT FREQUENCY: 2x/week  PT DURATION: 4 weeks  PLANNED INTERVENTIONS: Therapeutic exercises, Therapeutic activity, Neuromuscular re-education, Balance training, Gait training, Patient/Family education, Self Care, Joint mobilization, Stair training, Vestibular training, Canalith repositioning, Visual/preceptual remediation/compensation, DME instructions, Aquatic Therapy, Manual therapy, and Re-evaluation  PLAN FOR NEXT SESSION: EC on compliant surfaces, smooth pursuits   Debbora Dus, PT Debbora Dus, PT, DPT, CBIS  04/17/2022, 3:42 PM

## 2022-04-22 ENCOUNTER — Ambulatory Visit: Payer: Medicare HMO

## 2022-04-22 DIAGNOSIS — R42 Dizziness and giddiness: Secondary | ICD-10-CM | POA: Diagnosis not present

## 2022-04-22 DIAGNOSIS — R2681 Unsteadiness on feet: Secondary | ICD-10-CM | POA: Diagnosis not present

## 2022-04-22 NOTE — Therapy (Signed)
OUTPATIENT PHYSICAL THERAPY VESTIBULAR TREATMENT     Patient Name: Alejandro Koch MRN: [id number] DOB:October 28, 1950, 72 y.o., male Today's Date: 04/22/2022  END OF SESSION:  PT End of Session - 04/22/22 1449     Visit Number 4    Number of Visits 9    Date for PT Re-Evaluation 05/12/22    Authorization Type [employer] Medicare    PT Start Time 1447    PT Stop Time 1527    PT Time Calculation (min) 40 min    Equipment Utilized During Treatment Gait belt    Activity Tolerance Patient tolerated treatment well    Behavior During Therapy WFL for tasks assessed/performed             Past Medical History:  Diagnosis Date   Abdominal discomfort, epigastric 06/10/2017   Actinic keratoses 05/12/2014   Acute chest pain 12/20/2007   Acute prostatitis 06/10/2017   Classic presentation; will treat with sulfa x 2 weeks. He did well with this back in March   Allergic rhinitis 09/30/2006   Anemia    Benign essential hypertension 09/30/2006   BP Readings from Last 3 Encounters: 06/16/17 138/72  06/09/17 118/66  12/09/16 136/82   Reasonable control   Blood transfusion without reported diagnosis    40 yrs ago    BPH with obstruction/lower urinary tract symptoms 12/27/2018   Still with mild symptoms. If worsens, will need to go back to the urologist   CHF (congestive heart failure)    Chronic cough 07/16/2018   Fits definition of chronic bronchitis except not 2 years in a row. Seeing the pulmonary doctor. Doesn't feel the breo has helped   Extensor tendon laceration of right hand with open wound 09/16/2016   Fatigue 06/10/2017   Fatty liver 03/01/2010   LFTs normal. Still needs more weight loss   GERD (gastroesophageal reflux disease) 09/30/2006   Tried omeprazole and symptoms all came back. Will order the nexium again   Glaucoma    Hyperlipemia 09/30/2006   Inconsistent with the atorvastatin. Discussed---will take it twice a week   Hyperlipidemia    Hypogonadism in male 09/19/2009    Kidney stones    Laryngopharyngeal reflux (LPR) 06/03/2016   Left foot pain 09/20/2019   Major depressive disorder    Mild neurocognitive disorder 05/18/2020   OSA (obstructive sleep apnea) 09/30/2006   NPSG 07/2014:  AHI 23/hr, PLMS 452 with 12/hr with A/A NPSG 07/2014:  AHI 23/hr, PLMS 452 with 12/hr with A/A. The patient has moderate obstructive sleep apnea by his recent study, and is very symptomatic during the day. I have discussed with him the various treatment options, and have recommended a trial of C P   Shortness of breath 05/21/2015   09/11/17 Right and Left Heart cath: Right radial and brachial access.Left dominant coronary system with no CAD RHC with some exercise showed RA 15, 14 mm Hg (mean 12)PA 37/14  mean 27,PWP 17,15 mean 16   Type 2 diabetes mellitus with neurological manifestations, uncontrolled 03/05/2009   HGBA1C 8.7 (A) 03/30/2018. Not much better. I want to send him to endocrine--he wants to hold off (doesn't want insulin, etc) Needs formal help---like Weight [employer] Will give him 3 months more   Vitamin B12 deficiency 06/16/2017   Does take supplement daily. Will check with full labs at next visit   Past Surgical History:  Procedure Laterality Date   CARDIAC CATHETERIZATION     [facility] - myocard perf and echo done as well - all normal-  EF 48-55% 2019 April and june 2019   COLONOSCOPY  2008   FINGER SURGERY  2013   infection 2nd finger left hand   POLYPECTOMY     REPAIR FLEXOR TENDON HAND Right 08/2016   Patient Active Problem List   Diagnosis Date Noted   Skin lesion 11/06/2021   Preventative health care 03/01/2021   Mood disorder ([place]) 03/01/2021   Mild neurocognitive disorder 05/18/2020   Major depressive disorder    BPH with obstruction/lower urinary tract symptoms 12/27/2018   Vitamin B12 deficiency 06/16/2017   Laryngopharyngeal reflux (LPR) 06/03/2016   Anemia 07/23/2015   Fatty liver 03/01/2010   Type 2 DM with diabetic neuropathy affecting both sides of  body ([place]) 03/05/2009   Hyperlipemia 09/30/2006   Benign essential hypertension 09/30/2006   Allergic rhinitis 09/30/2006   GERD (gastroesophageal reflux disease) 09/30/2006   OSA (obstructive sleep apnea) 09/30/2006    PCP: Viviana Simpler, MD  REFERRING PROVIDER: Cameron Sprang, MD  REFERRING DIAG: R42 (ICD-10-CM) - Dizziness  THERAPY DIAG:  Dizziness and giddiness  Unsteadiness on feet  ONSET DATE: ~6 months ago  Rationale for Evaluation and Treatment: Rehabilitation  SUBJECTIVE:   SUBJECTIVE STATEMENT: Patient arrives to the clinic by himself today. Reports a few instances of falling to the L into a wall, but no actual falls. Exercises going well.  Pt accompanied by: significant other  PERTINENT HISTORY: hypertension, hyperlipidemia, OSA, diabetes, anxiety, depression, distant history of C4/C5 fx  PAIN:  Are you having pain? No  PRECAUTIONS: Fall  PATIENT GOALS: "figure out why I'm dizzy"    VESTIBULAR TREATMENT:                                                                                                   - VOR x2 with increased cuing needed for coordination, but did not elicit dizziness -Blaze pods 3 on ground, 3 on mirror random tapping 3 x2 mins  -60 hits, 68 hits, 75 hits - added standing on Airex   -92 hits, 102 hits, 101 hits   -DVA static: line 7  Dynamic: line 8  PATIENT EDUCATION: Education details: continue HEP Person educated: Patient and Spouse Education method: Explanation Education comprehension: verbalized understanding and needs further education  HOME EXERCISE PROGRAM: Access Code: [email or web address] URL: [email or web address] Date: 04/15/2022 Prepared by: Estevan Ryder  Exercises - Standing Balance in Corner  - 1 x daily - 7 x weekly - 3 sets - 30s hold - Standing Balance in Corner with Eyes Closed  - 1 x daily - 7 x weekly - 3 sets - 30s hold - Corner Balance Feet Together With Eyes Open  - 1 x daily - 7 x weekly - 3 sets -  30s hold - Corner Balance Feet Together With Eyes Closed  - 1 x daily - 7 x weekly - 3 sets - 30s hold - Semi-Tandem Corner Balance With Eyes Open  - 1 x daily - 7 x weekly - 3 sets - 30s hold - Semi-Tandem Corner Balance With Eyes Closed  - 1 x daily - 7 x weekly - 3  sets - 30s hold  GOALS: Goals reviewed with patient? Yes  SHORT TERM GOALS: = LTG based on POC length   LONG TERM GOALS: Target date: 05/12/22  Pt will be independent with final HEP for improved symptom report  Baseline: to be provided  Goal status: INITIAL  2. Patient will improve FOTO score to >/= 62 to demonstrate improved symptom report Baseline: 58 Goal status: INITIAL  3.  Patient will complete 30s in condition 4 of MCTSIB with normal postural sway  Baseline: 30s moderate sway  Goal status: INITIAL  4.  MSQ not indicated due to 0/5 report Baseline: 0/5 Goal status: INITIAL  5.  FGA goal Baseline: not indicated as patient scored a 28/30 Goal status: INITIAL   6. DVA goal    Baseline: not indicated due to 0 line difference   Goal status: INITIAL ASSESSMENT:  CLINICAL IMPRESSION: Patient seen for skilled PT session with emphasis on vestibular retraining. Patient reports slight decrease in dizziness, but continues with random onset that PT cannot recreate. Some difficulty with visual scanning, especially on compliant surface, but no overt LOB or increase in dizziness. DVA actually improved when dynamic vs static by 1 line and so goal not indicated. Continue POC, but potentially dc due to PT inability to recreate and thus treat symptoms.   OBJECTIVE IMPAIRMENTS: Abnormal gait, decreased balance, decreased cognition, decreased knowledge of condition, and dizziness.   ACTIVITY LIMITATIONS: bending, sitting, standing, reach over head, and caring for others  PARTICIPATION LIMITATIONS: cleaning, interpersonal relationship, shopping, community activity, occupation, and yard work  PERSONAL FACTORS: Behavior  pattern, Past/current experiences, Time since onset of injury/illness/exacerbation, Transportation, and 3+ comorbidities: MCI, previous Cervical fx, R vertebral artery narrowing  are also affecting patient's functional outcome.   REHAB POTENTIAL: Fair time since onset, unknown etiology   CLINICAL DECISION MAKING: Evolving/moderate complexity  EVALUATION COMPLEXITY: Moderate   PLAN:  PT FREQUENCY: 2x/week  PT DURATION: 4 weeks  PLANNED INTERVENTIONS: Therapeutic exercises, Therapeutic activity, Neuromuscular re-education, Balance training, Gait training, Patient/Family education, Self Care, Joint mobilization, Stair training, Vestibular training, Canalith repositioning, Visual/preceptual remediation/compensation, DME instructions, Aquatic Therapy, Manual therapy, and Re-evaluation  PLAN FOR NEXT SESSION: if can't recreate symptoms, dc?   Debbora Dus, PT Debbora Dus, PT, DPT, CBIS  04/22/2022, 3:30 PM

## 2022-04-24 ENCOUNTER — Ambulatory Visit: Payer: Medicare HMO | Admitting: Physical Therapy

## 2022-04-24 ENCOUNTER — Encounter: Payer: Self-pay | Admitting: Physical Therapy

## 2022-04-24 DIAGNOSIS — R42 Dizziness and giddiness: Secondary | ICD-10-CM | POA: Diagnosis not present

## 2022-04-24 DIAGNOSIS — R2681 Unsteadiness on feet: Secondary | ICD-10-CM | POA: Diagnosis not present

## 2022-04-24 NOTE — Therapy (Signed)
OUTPATIENT PHYSICAL THERAPY VESTIBULAR TREATMENT/DISCHARGE SUMMARY      Patient Name: Alejandro Koch MRN: [id number] DOB:1950/12/07, 72 y.o., male Today's Date: 04/25/2022  END OF SESSION:  PT End of Session - 04/24/22 1621     Visit Number 5    Number of Visits 9    Date for PT Re-Evaluation 05/12/22    Authorization Type [employer]    PT Start Time 1450    PT Stop Time 1540    PT Time Calculation (min) 50 min    Equipment Utilized During Treatment Gait belt    Activity Tolerance Patient tolerated treatment well    Behavior During Therapy WFL for tasks assessed/performed              Past Medical History:  Diagnosis Date   Abdominal discomfort, epigastric 06/10/2017   Actinic keratoses 05/12/2014   Acute chest pain 12/20/2007   Acute prostatitis 06/10/2017   Classic presentation; will treat with sulfa x 2 weeks. He did well with this back in March   Allergic rhinitis 09/30/2006   Anemia    Benign essential hypertension 09/30/2006   BP Readings from Last 3 Encounters: 06/16/17 138/72  06/09/17 118/66  12/09/16 136/82   Reasonable control   Blood transfusion without reported diagnosis    40 yrs ago    BPH with obstruction/lower urinary tract symptoms 12/27/2018   Still with mild symptoms. If worsens, will need to go back to the urologist   CHF (congestive heart failure)    Chronic cough 07/16/2018   Fits definition of chronic bronchitis except not 2 years in a row. Seeing the pulmonary doctor. Doesn't feel the breo has helped   Extensor tendon laceration of right hand with open wound 09/16/2016   Fatigue 06/10/2017   Fatty liver 03/01/2010   LFTs normal. Still needs more weight loss   GERD (gastroesophageal reflux disease) 09/30/2006   Tried omeprazole and symptoms all came back. Will order the nexium again   Glaucoma    Hyperlipemia 09/30/2006   Inconsistent with the atorvastatin. Discussed---will take it twice a week   Hyperlipidemia    Hypogonadism in  male 09/19/2009   Kidney stones    Laryngopharyngeal reflux (LPR) 06/03/2016   Left foot pain 09/20/2019   Major depressive disorder    Mild neurocognitive disorder 05/18/2020   OSA (obstructive sleep apnea) 09/30/2006   NPSG 07/2014:  AHI 23/hr, PLMS 452 with 12/hr with A/A NPSG 07/2014:  AHI 23/hr, PLMS 452 with 12/hr with A/A. The patient has moderate obstructive sleep apnea by his recent study, and is very symptomatic during the day. I have discussed with him the various treatment options, and have recommended a trial of C P   Shortness of breath 05/21/2015   09/11/17 Right and Left Heart cath: Right radial and brachial access.Left dominant coronary system with no CAD RHC with some exercise showed RA 15, 14 mm Hg (mean 12)PA 37/14  mean 27,PWP 17,15 mean 16   Type 2 diabetes mellitus with neurological manifestations, uncontrolled 03/05/2009   HGBA1C 8.7 (A) 03/30/2018. Not much better. I want to send him to endocrine--he wants to hold off (doesn't want insulin, etc) Needs formal help---like Weight Watchers Will give him 3 months more   Vitamin B12 deficiency 06/16/2017   Does take supplement daily. Will check with full labs at next visit   Past Surgical History:  Procedure Laterality Date   CARDIAC CATHETERIZATION     [facility] - myocard perf and echo done as well -  all normal- EF 48-55% 2019 April and june 2019   COLONOSCOPY  2008   FINGER SURGERY  2013   infection 2nd finger left hand   POLYPECTOMY     REPAIR FLEXOR TENDON HAND Right 08/2016   Patient Active Problem List   Diagnosis Date Noted   Skin lesion 11/06/2021   Preventative health care 03/01/2021   Mood disorder ([place]) 03/01/2021   Mild neurocognitive disorder 05/18/2020   Major depressive disorder    BPH with obstruction/lower urinary tract symptoms 12/27/2018   Vitamin B12 deficiency 06/16/2017   Laryngopharyngeal reflux (LPR) 06/03/2016   Anemia 07/23/2015   Fatty liver 03/01/2010   Type 2 DM with diabetic neuropathy  affecting both sides of body ([place]) 03/05/2009   Hyperlipemia 09/30/2006   Benign essential hypertension 09/30/2006   Allergic rhinitis 09/30/2006   GERD (gastroesophageal reflux disease) 09/30/2006   OSA (obstructive sleep apnea) 09/30/2006    PCP: Viviana Simpler, MD  REFERRING PROVIDER: Cameron Sprang, MD  REFERRING DIAG: R42 (ICD-10-CM) - Dizziness  THERAPY DIAG:  Dizziness and giddiness  ONSET DATE: ~6 months ago  Rationale for Evaluation and Treatment: Rehabilitation  SUBJECTIVE:   SUBJECTIVE STATEMENT: Patient reports no changes in intermittent episodes of strange sensation on top of head followed by double vision, dizziness and then falling - pt denies having had episode or any falls since previous PT session;  Pt states dizziness just spontaneously occurs in any position.  Pt reports PT has not helped or made a difference.  Pt is accompanied by his wife to today's appt.  Pt accompanied by: significant other  PERTINENT HISTORY: hypertension, hyperlipidemia, OSA, diabetes, anxiety, depression, distant history of C4/C5 fx  PAIN:  Are you having pain? No  PRECAUTIONS: Fall  PATIENT GOALS: "figure out why I'm dizzy"   SELF CARE: Discussed at length that pt's symptoms are not consistent with vestibular system dysfunction.  As stated above pt reports the episodes continue to be spontaneous in occurrence and starts with funny or strange sensation on top of his head, followed by diplopia, dizziness and sudden LOB in which he either lowers himself to ground/floor in a controlled manner or else he falls.  Pt states he has been doing the gaze stabilization exercise for HEP but states it produces no symptoms.  Pt's DVA was tested by his primary PT and she reports that pt had a 0 line difference.  Dizziness has not been recreated in any of the 3 PT sessions attended since initial eval on 04-07-22.  Pt states he has been doing balance exercises at home but reports balance is not a problem  unless he is having dizziness.  Pt and wife have been instructed to follow up with MD for further diagnostic work up to determine etiology of dizziness.  Symptoms are more consistent with lack of circulation; wife and pt are aware of MRA results with findings "Focal severe proximal left P2 stenosis, with additional short-segment mild-to-moderate distal right P2 stenosis". Wife reports that pt had appt. On 04-02-22 with Dr. Delice Lesch to discuss results of MRA.    PATIENT EDUCATION: Education details: continue HEP Person educated: Patient and Spouse Education method: Explanation Education comprehension: verbalized understanding and needs further education  HOME EXERCISE PROGRAM: Access Code: [email or web address] URL: [email or web address] Date: 04/15/2022 Prepared by: Estevan Ryder  Exercises - Standing Balance in Corner  - 1 x daily - 7 x weekly - 3 sets - 30s hold - Standing Balance in Corner with Eyes Closed  - 1 x daily -  7 x weekly - 3 sets - 30s hold - Corner Balance Feet Together With Eyes Open  - 1 x daily - 7 x weekly - 3 sets - 30s hold - Corner Balance Feet Together With Eyes Closed  - 1 x daily - 7 x weekly - 3 sets - 30s hold - Semi-Tandem Corner Balance With Eyes Open  - 1 x daily - 7 x weekly - 3 sets - 30s hold - Semi-Tandem Corner Balance With Eyes Closed  - 1 x daily - 7 x weekly - 3 sets - 30s hold  GOALS: Goals reviewed with patient? Yes  SHORT TERM GOALS: = LTG based on POC length   LONG TERM GOALS: Target date: 05/12/22  Pt will be independent with final HEP for improved symptom report  Baseline: to be provided  Goal status: Partially met - pt is independent with HEP but reports no improvement in symptoms  2. Patient will improve FOTO score to >/= 62 to demonstrate improved symptom report Baseline: 58 Goal status: Deferred due to D/C due to lack of progress  3.  Patient will complete 30s in condition 4 of MCTSIB with normal postural sway  Baseline: 30s moderate  sway  Goal status: Goal deferred due to D/C due to lack of progress  4.  MSQ not indicated due to 0/5 report Baseline: 0/5 Goal status: INITIAL  5.  FGA goal Baseline: not indicated as patient scored a 28/30 Goal status: INITIAL   6. DVA goal    Baseline: not indicated due to 0 line difference   Goal status: INITIAL ASSESSMENT:  CLINICAL IMPRESSION: Pt is discharged due to lack of progress and pt's symptoms not appearing to be of vestibular system dysfunction.  Pt reports random, spontaneous episodes of diplopia, funny sensation on top of head followed by dizziness and either falling or lowering himself to the ground to prevent a fall.  Pt's DVA is WNL's and balance and gait are WNL's when pt is not experiencing dizziness; pt's FGA score 28/30.  Pt is discharged due to lack of progress with recommendation to follow up with MD for further diagnostic testing to determine etiology of dizziness.  Wife reports pt has appt with ENT in April.   Wife and pt agree with D/C at this time.   OBJECTIVE IMPAIRMENTS: Abnormal gait, decreased balance, decreased cognition, decreased knowledge of condition, and dizziness.   ACTIVITY LIMITATIONS: bending, sitting, standing, reach over head, and caring for others  PARTICIPATION LIMITATIONS: cleaning, interpersonal relationship, shopping, community activity, occupation, and yard work  PERSONAL FACTORS: Behavior pattern, Past/current experiences, Time since onset of injury/illness/exacerbation, Transportation, and 3+ comorbidities: MCI, previous Cervical fx, R vertebral artery narrowing  are also affecting patient's functional outcome.   REHAB POTENTIAL: Fair time since onset, unknown etiology   CLINICAL DECISION MAKING: Evolving/moderate complexity  EVALUATION COMPLEXITY: Moderate   PLAN:  PT FREQUENCY: 2x/week  PT DURATION: 4 weeks  PLANNED INTERVENTIONS: Therapeutic exercises, Therapeutic activity, Neuromuscular re-education, Balance training,  Gait training, Patient/Family education, Self Care, Joint mobilization, Stair training, Vestibular training, Canalith repositioning, Visual/preceptual remediation/compensation, DME instructions, Aquatic Therapy, Manual therapy, and Re-evaluation  PLAN FOR NEXT SESSION: D/C on 04-24-22   PHYSICAL THERAPY DISCHARGE SUMMARY  Visits from Start of Care: 5  Current functional level related to goals / functional outcomes: Pt reports no changes in symptoms;  PLEASE NOTE DVA is WNL's with a 0 line difference as tested by pt's primary PT   Remaining deficits: Continued pt report of random onset  of diplopia followed by dizziness and then either lowering himself to ground or falling; pt reports the onset is rather slow with the duration of the dizziness lasting only seconds   Education / Equipment: Pt has been instructed in balance HEP and was instructed in gaze stabilization exercise, even though no deficits noted with this   Patient agrees to discharge. Patient goals were partially met. Patient is being discharged due to lack of progress. Pt and wife agree with D/C as etiology of dizziness is unknown at this time - symptoms are not consistent with vestibular system dysfunction.     Alda Lea, PT   04/25/2022, 1:50 PM

## 2022-04-25 ENCOUNTER — Encounter: Payer: Self-pay | Admitting: Physical Therapy

## 2022-04-29 ENCOUNTER — Ambulatory Visit: Payer: Medicare HMO | Admitting: Physical Therapy

## 2022-04-30 NOTE — Progress Notes (Signed)
Pt called and mychart message he wants to talk to his wife and then will call or my chart back

## 2022-04-30 NOTE — Progress Notes (Signed)
Pt called no answer left a voice mail for pt to call the office back

## 2022-05-01 ENCOUNTER — Encounter: Payer: Medicare HMO | Admitting: Physical Therapy

## 2022-05-06 ENCOUNTER — Encounter: Payer: Medicare HMO | Admitting: Physical Therapy

## 2022-05-12 ENCOUNTER — Other Ambulatory Visit: Payer: Self-pay | Admitting: Internal Medicine

## 2022-05-12 DIAGNOSIS — G4733 Obstructive sleep apnea (adult) (pediatric): Secondary | ICD-10-CM | POA: Diagnosis not present

## 2022-05-27 ENCOUNTER — Other Ambulatory Visit: Payer: Self-pay | Admitting: Internal Medicine

## 2022-06-10 DIAGNOSIS — G4733 Obstructive sleep apnea (adult) (pediatric): Secondary | ICD-10-CM | POA: Diagnosis not present

## 2022-06-18 ENCOUNTER — Ambulatory Visit (INDEPENDENT_AMBULATORY_CARE_PROVIDER_SITE_OTHER): Payer: Medicare HMO | Admitting: Internal Medicine

## 2022-06-18 ENCOUNTER — Encounter: Payer: Self-pay | Admitting: Internal Medicine

## 2022-06-18 VITALS — BP 132/84 | HR 78 | Temp 97.7°F | Ht 75.5 in | Wt 241.2 lb

## 2022-06-18 DIAGNOSIS — G3184 Mild cognitive impairment, so stated: Secondary | ICD-10-CM | POA: Diagnosis not present

## 2022-06-18 DIAGNOSIS — I10 Essential (primary) hypertension: Secondary | ICD-10-CM | POA: Diagnosis not present

## 2022-06-18 DIAGNOSIS — R69 Illness, unspecified: Secondary | ICD-10-CM | POA: Diagnosis not present

## 2022-06-18 DIAGNOSIS — G4733 Obstructive sleep apnea (adult) (pediatric): Secondary | ICD-10-CM | POA: Diagnosis not present

## 2022-06-18 DIAGNOSIS — F39 Unspecified mood [affective] disorder: Secondary | ICD-10-CM | POA: Diagnosis not present

## 2022-06-18 DIAGNOSIS — Z0000 Encounter for general adult medical examination without abnormal findings: Secondary | ICD-10-CM | POA: Diagnosis not present

## 2022-06-18 DIAGNOSIS — E1142 Type 2 diabetes mellitus with diabetic polyneuropathy: Secondary | ICD-10-CM

## 2022-06-18 LAB — POCT GLYCOSYLATED HEMOGLOBIN (HGB A1C): Hemoglobin A1C: 8.1 % — AB (ref 4.0–5.6)

## 2022-06-18 LAB — HM DIABETES FOOT EXAM

## 2022-06-18 NOTE — Assessment & Plan Note (Signed)
I have personally reviewed the Medicare Annual Wellness questionnaire and have noted 1. The patient's medical and social history 2. Their use of alcohol, tobacco or illicit drugs 3. Their current medications and supplements 4. The patient's functional ability including ADL's, fall risks, home safety risks and hearing or visual             impairment. 5. Diet and physical activities 6. Evidence for depression or mood disorders  The patients weight, height, BMI and visual acuity have been recorded in the chart I have made referrals, counseling and provided education to the patient based review of the above and I have provided the pt with a written personalized care plan for preventive services.  I have provided you with a copy of your personalized plan for preventive services. Please take the time to review along with your updated medication list.  Discussed increased exercise Colon due at the end of this year Flu/RSV in the fall Updated COVID vaccine soon Will check last PSA today

## 2022-06-18 NOTE — Assessment & Plan Note (Addendum)
Seems to still be good with bydureon 2mg , farxiga 10mg  daily, metformin 1000mg  bid, glipizide 10 daily Will recheck A1c Lab Results  Component Value Date   HGBA1C 8.1 (A) 06/18/2022   Clearly worse Will have him work on lifestyle and recheck in 3 months

## 2022-06-18 NOTE — Assessment & Plan Note (Signed)
BP Readings from Last 3 Encounters:  06/18/22 132/84  04/02/22 127/69  03/18/22 (!) 86/58   Controlled without meds now

## 2022-06-18 NOTE — Assessment & Plan Note (Signed)
Feels better with the CPAP --though he doesn't like it

## 2022-06-18 NOTE — Assessment & Plan Note (Signed)
Seems to be MCI Follows with Dr Delice Lesch

## 2022-06-18 NOTE — Progress Notes (Addendum)
Subjective:    Patient ID: Alejandro Koch, male    DOB: 1950-09-14, 72 y.o.   MRN: [id number]  HPI Here for Medicare wellness visit and follow up of chronic health conditions With wife Reviewed advanced directives Reviewed other doctors--Dr Aquino-neurology, Dr Henriette Combs, Dr Mariah Milling, Dr Henrene Dodge, Dr Avon Gully No hospitalizations or surgery in the past year Walks regularly--discussed resistance Vision is okay Hearing is declining ----tinnitus is worse. Going to ENT in 2 days ([facility]) Rare wine No tobacco No falls Mild mood issues---having to retire and memory issues. Does do some day trading for interest. Not anhedonic Independent with instrumental ADLs  Had MRA  Some narrowing in left P2 artery---no action needed Dr Delice Lesch sent him for vestibular therapy---discharged now (they didn't think it was vestibular) Still has dizziness--not positional or related to walking No stiffness No tremor  Feels his memory is "fading fast" No clear functional decline--"but I am not as on top of things as I used to be"  Checks sugars---variable if he isn't careful Highest is 160---usually around 120 Past foot numbness---seems better now  No chest pain No palpitations No SOB No syncope No edema No headaches  Current Outpatient Medications on File Prior to Visit  Medication Sig Dispense Refill   ALPRAZolam (XANAX) 0.5 MG tablet TAKE 1 TABLET BY MOUTH THREE TIMES A DAY AS NEEDED 90 tablet 0   aspirin 81 MG chewable tablet Chew by mouth.     benzonatate (TESSALON) 200 MG capsule TAKE 1 CAPSULE (200 MG TOTAL) BY MOUTH 3 (THREE) TIMES DAILY AS NEEDED FOR COUGH. 60 capsule 0   Blood Glucose Calibration (ONETOUCH VERIO) SOLN AS DIRECTED 1 each 1   Blood Glucose Monitoring Suppl (ONETOUCH VERIO FLEX SYSTEM) w/Device KIT Use as directed,once daily to test blood sugar 1 kit 0   BYDUREON BCISE 2 MG/0.85ML AUIJ INJECT 2 MG INTO THE SKIN ONCE A WEEK. 10.2 mL 11    cyclobenzaprine (FLEXERIL) 10 MG tablet Take by mouth.     dapagliflozin propanediol (FARXIGA) 10 MG TABS tablet Take 1 tablet (10 mg total) by mouth daily before breakfast. 90 tablet 3   esomeprazole (NEXIUM) 20 MG capsule Take 40 mg by mouth 2 (two) times daily before a meal.     finasteride (PROSCAR) 5 MG tablet TAKE 1 TABLET BY MOUTH EVERY DAY (Patient taking differently: Take 5 mg by mouth daily.) 90 tablet 3   glipiZIDE (GLUCOTROL XL) 10 MG 24 hr tablet TAKE 1 TABLET (10 MG TOTAL) BY MOUTH DAILY WITH BREAKFAST. 90 tablet 3   glucose blood (ONETOUCH VERIO) test strip CHECK BLOOD SUGAR EVERY DAY. E11.42 50 strip 11   hydrocortisone 2.5 % cream APPLY TOPICALLY 3 TIMES DAILY AS NEEDED. 28 g 3   ketorolac (ACULAR) 0.5 % ophthalmic solution USE AS DIRECTED 5 mL 1   metFORMIN (GLUCOPHAGE-XR) 500 MG 24 hr tablet TAKE 2 TABLETS (1,000 MG TOTAL) BY MOUTH IN THE MORNING AND AT BEDTIME. 360 tablet 3   Meth-Hyo-M Bl-Na Phos-Ph Sal (URO-MP) 118 MG CAPS Take 1 capsule by mouth every 6 (six) hours as needed.     ONETOUCH DELICA LANCETS FINE MISC 1 Units by Does not apply route daily. 100 each 2   rosuvastatin (CRESTOR) 10 MG tablet TAKE 1 TABLET BY MOUTH EVERY DAY 90 tablet 3   tamsulosin (FLOMAX) 0.4 MG CAPS capsule Take 2 capsules (0.8 mg total) by mouth daily. Patient reports taking 2 0.4mg  capsules. 180 capsule 3   urea (CARMOL) 40 % CREA APPLY  PEA SIZED AMOUNT TO HARD AREA BELOW PINKY TOE TWICE A DAY 28.35 g 1   vitamin B-12 (CYANOCOBALAMIN) 1000 MCG tablet Take 1 tablet (1,000 mcg total) by mouth daily.     nitroGLYCERIN (NITROSTAT) 0.4 MG SL tablet Place under the tongue.     No current facility-administered medications on file prior to visit.    Allergies  Allergen Reactions   Iohexol      Code: HIVES, Desc: PT IS ALLERGIC TO IVP DYE AS NOTED IN PREVIOUS RECORDS IN PACS 02/2009/RM, Onset Date: [id number]    Atorvastatin Other (See Comments)    Myalgias with this and rosuvastin    Past  Medical History:  Diagnosis Date   Abdominal discomfort, epigastric 06/10/2017   Actinic keratoses 05/12/2014   Acute chest pain 12/20/2007   Acute prostatitis 06/10/2017   Classic presentation; will treat with sulfa x 2 weeks. He did well with this back in March   Allergic rhinitis 09/30/2006   Anemia    Benign essential hypertension 09/30/2006   BP Readings from Last 3 Encounters: 06/16/17 138/72  06/09/17 118/66  12/09/16 136/82   Reasonable control   Blood transfusion without reported diagnosis    40 yrs ago    BPH with obstruction/lower urinary tract symptoms 12/27/2018   Still with mild symptoms. If worsens, will need to go back to the urologist   CHF (congestive heart failure)    Chronic cough 07/16/2018   Fits definition of chronic bronchitis except not 2 years in a row. Seeing the pulmonary doctor. Doesn't feel the breo has helped   Extensor tendon laceration of right hand with open wound 09/16/2016   Fatigue 06/10/2017   Fatty liver 03/01/2010   LFTs normal. Still needs more weight loss   GERD (gastroesophageal reflux disease) 09/30/2006   Tried omeprazole and symptoms all came back. Will order the nexium again   Glaucoma    Hyperlipemia 09/30/2006   Inconsistent with the atorvastatin. Discussed---will take it twice a week   Hyperlipidemia    Hypogonadism in male 09/19/2009   Kidney stones    Laryngopharyngeal reflux (LPR) 06/03/2016   Left foot pain 09/20/2019   Major depressive disorder    Mild neurocognitive disorder 05/18/2020   OSA (obstructive sleep apnea) 09/30/2006   NPSG 07/2014:  AHI 23/hr, PLMS 452 with 12/hr with A/A NPSG 07/2014:  AHI 23/hr, PLMS 452 with 12/hr with A/A. The patient has moderate obstructive sleep apnea by his recent study, and is very symptomatic during the day. I have discussed with him the various treatment options, and have recommended a trial of C P   Shortness of breath 05/21/2015   09/11/17 Right and Left Heart cath: Right radial and  brachial access.Left dominant coronary system with no CAD RHC with some exercise showed RA 15, 14 mm Hg (mean 12)PA 37/14  mean 27,PWP 17,15 mean 16   Type 2 diabetes mellitus with neurological manifestations, uncontrolled 03/05/2009   HGBA1C 8.7 (A) 03/30/2018. Not much better. I want to send him to endocrine--he wants to hold off (doesn't want insulin, etc) Needs formal help---like Weight Watchers Will give him 3 months more   Vitamin B12 deficiency 06/16/2017   Does take supplement daily. Will check with full labs at next visit    Past Surgical History:  Procedure Laterality Date   CARDIAC CATHETERIZATION     [facility] - myocard perf and echo done as well - all normal- EF 48-55% 2019 April and june 2019   COLONOSCOPY  2008  FINGER SURGERY  2013   infection 2nd finger left hand   POLYPECTOMY     REPAIR FLEXOR TENDON HAND Right 08/2016    Family History  Problem Relation Age of Onset   Alzheimer's disease Mother    Heart disease Father    COPD Father    Colon cancer Father 68   Prostate cancer Father    Dementia Father    Colon polyps Brother    Alzheimer's disease Maternal Grandmother    Leukemia Brother    Leukemia Other    Stomach cancer Neg Hx    Esophageal cancer Neg Hx    Rectal cancer Neg Hx     Social History   Socioeconomic History   Marital status: Married    Spouse name: Not on file   Number of children: 1   Years of education: 14   Highest education level: Associate degree: academic program  Occupational History   Occupation: Retired    Comment: EMS, Customer service manager  Tobacco Use   Smoking status: Former    Packs/day: 2.50    Years: 7.00    Additional pack years: 0.00    Total pack years: 17.50    Types: Cigarettes    Quit date: 03/18/1983    Years since quitting: 39.2    Passive exposure: Past   Smokeless tobacco: Never  Vaping Use   Vaping Use: Never used  Substance and Sexual Activity   Alcohol use: Not Currently    Comment: occasional glass of wine    Drug use: No   Sexual activity: Not on file  Other Topics Concern   Not on file  Social History Narrative   No living will or health care POA thus far   Wife, then son, should make decisions. May make son the primary person   Would accept resuscitation attempts but wouldn't want to be on machines   No tube feeds if cognitively unaware   Right handed   Social Determinants of Health   Financial Resource Strain: Not on file  Food Insecurity: Not on file  Transportation Needs: Not on file  Physical Activity: Not on file  Stress: Not on file  Social Connections: Not on file  Intimate Partner Violence: Not on file   Review of Systems Appetite is less-but enjoys eating Weight down 20# in last 2 years Sleeps okay---with the CPAP (though he doesn't like it) Inconsistent with seat belt-- discussed Teeth okay--keeps up with dentist No suspicious skin lesions---but gets tender scaly spots on tmeples (uses cream that helps) Nocturia down with CPAP---only twice now usually. Stream is okay--empties okay No sig back or joint pains    Objective:   Physical Exam Constitutional:      Appearance: Normal appearance.  HENT:     Mouth/Throat:     Pharynx: No oropharyngeal exudate or posterior oropharyngeal erythema.  Eyes:     Conjunctiva/sclera: Conjunctivae normal.     Pupils: Pupils are equal, round, and reactive to light.  Cardiovascular:     Rate and Rhythm: Normal rate and regular rhythm.     Pulses: Normal pulses.     Heart sounds:     No gallop.     Comments: trigeminy Pulmonary:     Effort: Pulmonary effort is normal.     Breath sounds: Normal breath sounds. No wheezing or rales.  Abdominal:     Palpations: Abdomen is soft.     Tenderness: There is no abdominal tenderness.  Musculoskeletal:     Cervical back:  Neck supple.     Right lower leg: No edema.     Left lower leg: No edema.  Lymphadenopathy:     Cervical: No cervical adenopathy.  Skin:    Findings: No lesion  or rash.     Comments: No foot lesions Mild seb derm at temples  Neurological:     General: No focal deficit present.     Mental Status: He is alert and oriented to person, place, and time.     Comments: Slightly decreased sensation in feet  Psychiatric:        Mood and Affect: Mood normal.        Behavior: Behavior normal.            Assessment & Plan:

## 2022-06-18 NOTE — Assessment & Plan Note (Signed)
Mild mood issues related to retirement and cognitive issues No MDD Meds not indicated

## 2022-06-20 DIAGNOSIS — R42 Dizziness and giddiness: Secondary | ICD-10-CM | POA: Insufficient documentation

## 2022-06-20 DIAGNOSIS — H903 Sensorineural hearing loss, bilateral: Secondary | ICD-10-CM | POA: Diagnosis not present

## 2022-06-20 DIAGNOSIS — H9313 Tinnitus, bilateral: Secondary | ICD-10-CM | POA: Diagnosis not present

## 2022-06-20 HISTORY — DX: Tinnitus, bilateral: H93.13

## 2022-06-20 HISTORY — DX: Dizziness and giddiness: R42

## 2022-06-20 HISTORY — DX: Sensorineural hearing loss, bilateral: H90.3

## 2022-06-24 IMAGING — DX DG ANKLE COMPLETE 3+V*L*
3 series · 3 of 3 positions shown · non-contrast
Comparison: None.

CLINICAL DATA: Injured left ankle yesterday.

EXAM:
LEFT ANKLE COMPLETE - 3+ VIEW

[ankle ap]
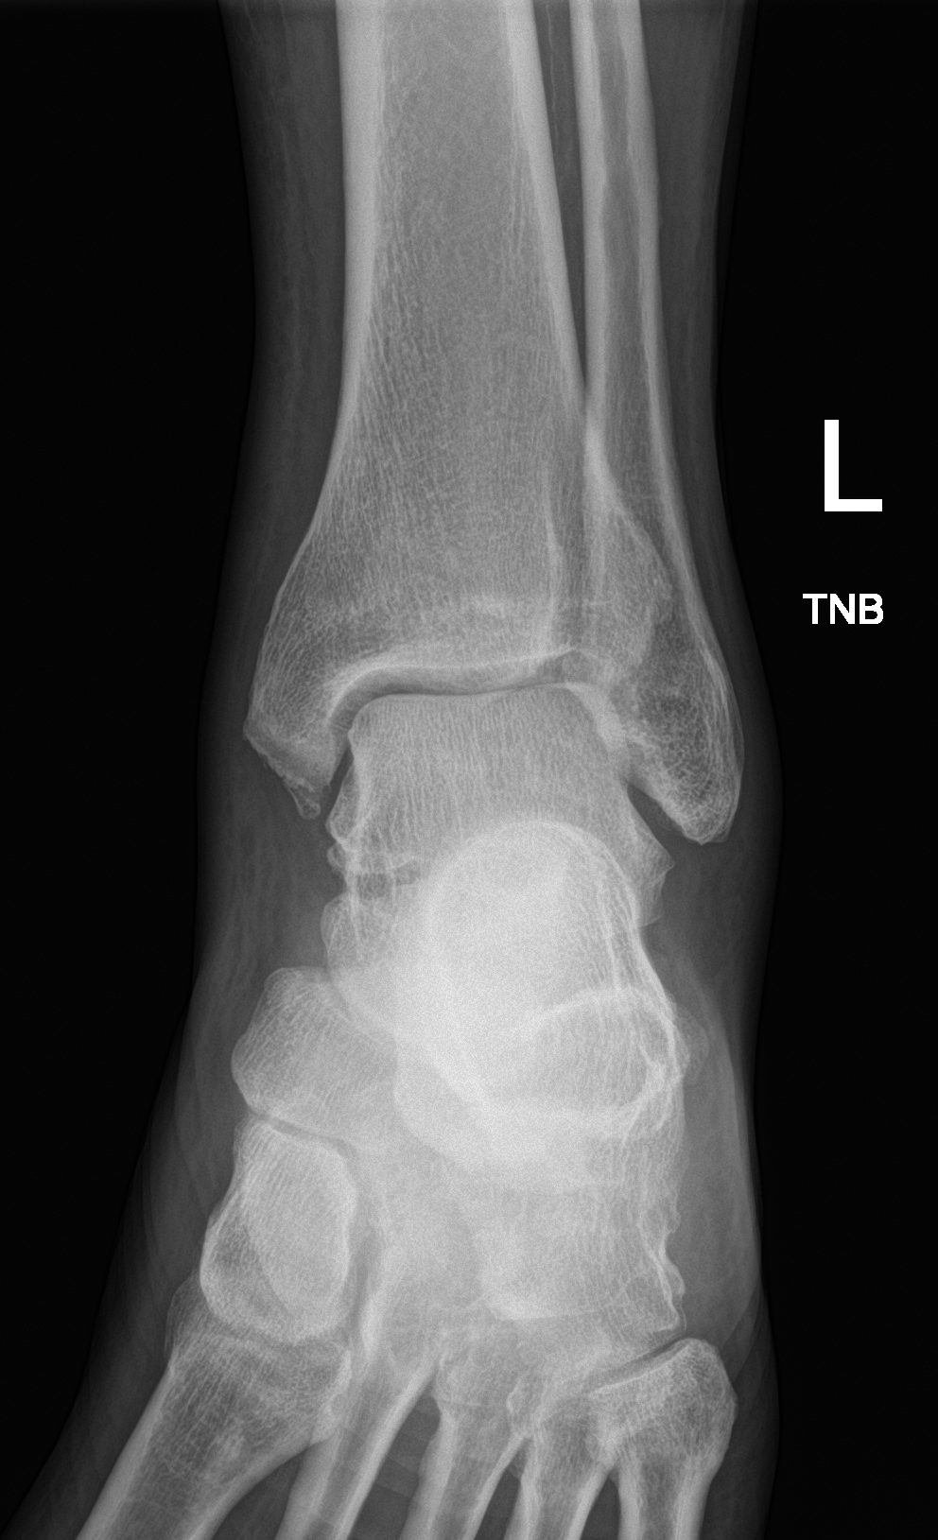

[ankle obl]
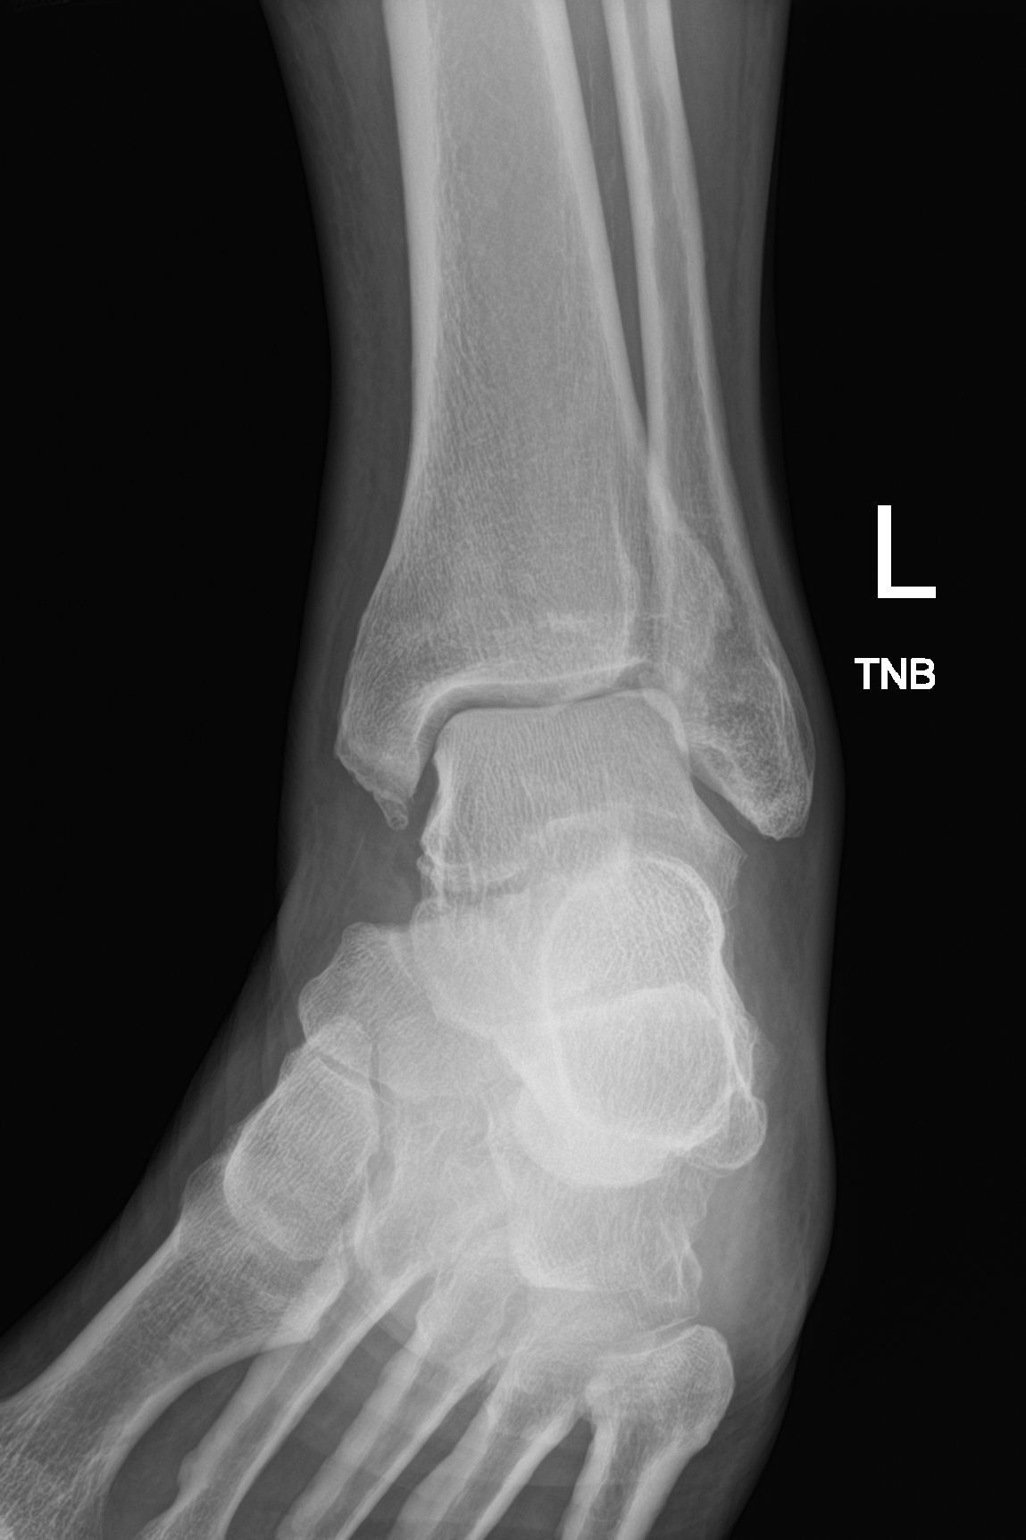

[ankle lat]
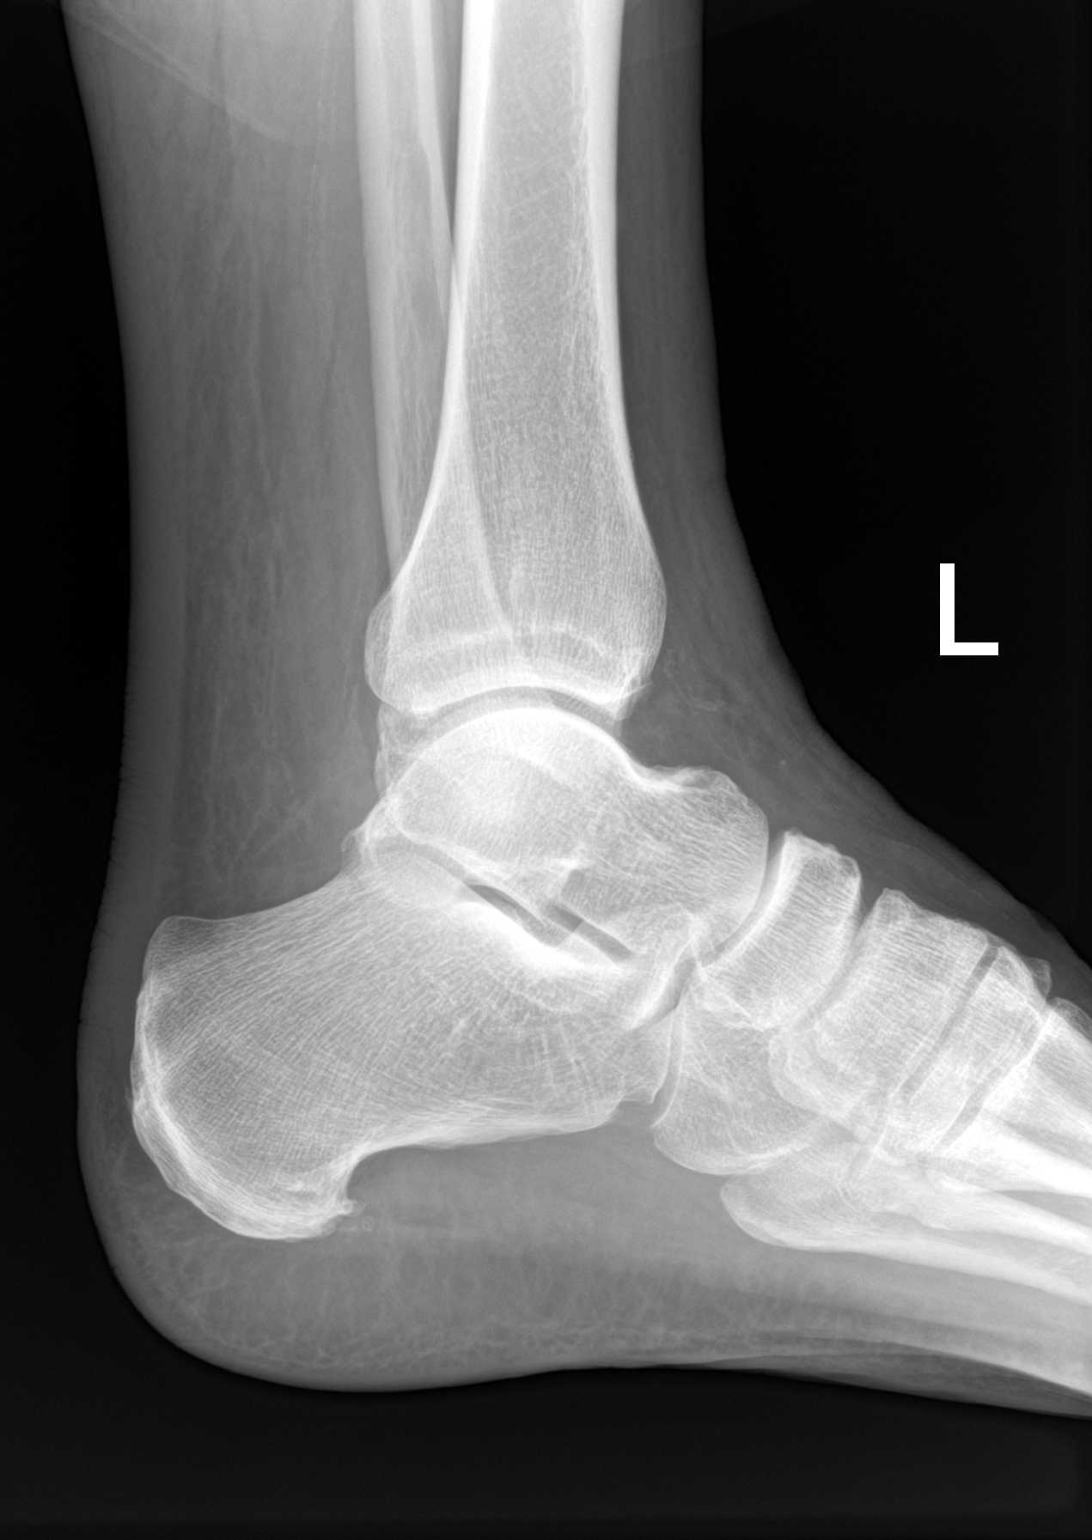

[3 of 3 positions shown; findings below may reference images not displayed]

FINDINGS: The ankle mortise is maintained. No acute ankle fracture is
identified. No ankle joint effusion. The subtalar joints are
maintained. The mid and hindfoot bony structures are intact.
IMPRESSION: No acute bony findings.

## 2022-07-07 ENCOUNTER — Other Ambulatory Visit: Payer: Self-pay | Admitting: Internal Medicine

## 2022-07-07 MED ORDER — DAPAGLIFLOZIN PROPANEDIOL 10 MG PO TABS: 10.0000 mg | ORAL_TABLET | Freq: Every day | ORAL | 3 refills | Status: DC

## 2022-07-10 ENCOUNTER — Ambulatory Visit: Payer: Medicare HMO | Admitting: Nurse Practitioner

## 2022-07-10 ENCOUNTER — Encounter: Payer: Self-pay | Admitting: Nurse Practitioner

## 2022-07-10 ENCOUNTER — Encounter: Payer: Self-pay | Admitting: Psychology

## 2022-07-10 VITALS — BP 138/80 | HR 69 | Temp 98.2°F | Ht 76.0 in | Wt 240.8 lb

## 2022-07-10 DIAGNOSIS — G4733 Obstructive sleep apnea (adult) (pediatric): Secondary | ICD-10-CM | POA: Diagnosis not present

## 2022-07-10 DIAGNOSIS — H401111 Primary open-angle glaucoma, right eye, mild stage: Secondary | ICD-10-CM | POA: Insufficient documentation

## 2022-07-10 DIAGNOSIS — H409 Unspecified glaucoma: Secondary | ICD-10-CM | POA: Insufficient documentation

## 2022-07-10 NOTE — Progress Notes (Signed)
@Patient  ID: Alejandro Koch, male    DOB: 24-Dec-1950, 72 y.o.   MRN: [id number]  Chief Complaint  Patient presents with   Follow-up    Pt has been using cpap. Pt has gotten use to it.     Referring provider: Karie Schwalbe, MD  HPI: 72 year old male, former smoker referred for sleep consult. Past medical history significant for HTN, allergic rhinitis, GERD, DM II, BPH, HLD, MDD.  TEST/EVENTS:  07/2014 NPSG: AHI 23/h 09/06/2021 HST: AHI 37.7/h, SpO2 low 87%  01/29/2022: OV with Prudie Guthridge NP for sleep consult, referred by Dr. Karel Jarvis. He has been having issues with memory impairment felt to be related to sleep dysfunction. He has a history of OSA and was on CPAP around 30 years ago but was unable to tolerate it so his provider at the time told him he could stop after he lost a significant amount of weight. His neurologist ordered a repeat sleep study, which he completed on 09/06/2021. This revealed severe OSA with AHI 37.7. He was not restarted on CPAP therapy yet. Today, he tells me that he has been struggling with nocturia and memory loss over the past few years. He doesn't really have any significant daytime fatigue symptoms. His wife doesn't noticed much snoring or any witnessed apneas. He does wake up with a dry mouth. Denies morning headaches, drowsy driving, sleep parasomnias/paralysis, No history of narcolepsy or cataplexy.  He goes to bed around 11 PM.  Usually falls asleep relatively easily.  Wakes 2-3 times a night to use the restroom.  Officially gets up around 6 to 7 AM. He is down 20 lb over the last 2 years; 75 lb since his previous sleep study years ago. 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  He has a history of hypertension and diabetes, currently controlled. No history of stroke. Spinal fusion in 1968. Former smoker; quit 1980's with 17.5 pack years. Lives at home with his wife. Retired from [employer], political party. Family history of emphysema, heart disease and  cancer [place] 4  07/10/2022: Today - follow up Patient presents today for follow up after starting on CPAP therapy. He is feeling much better since starting on CPAP. It did take him a little while to get adjusted to using it but he feels like he's finally used to it. He did go to the beach recently and forgot to take it but otherwise has been using it nightly. He didn't even realize how tired he was before. He wakes feeling refreshed. No longer dozing during the day. Able to do a lot more around the house due to better energy levels. His nighttime urination is about the same. He denies any drowsy driving, morning headaches, sleep parasomnias/paralysis. He's not noticing any large leaks. Not having any concerns.   06/10/2022-07/09/2022:  CPAP 5-20 cmH2O 27/30 days; 70% >4 hr; av  use 5 hr 40 min Pressure 95th 11.2 Leaks 95th 58.1 AHI 2.8  Allergies  Allergen Reactions   Iohexol      Code: HIVES, Desc: PT IS ALLERGIC TO IVP DYE AS NOTED IN PREVIOUS RECORDS IN PACS 02/2009/RM, Onset Date: [id number]    Atorvastatin Other (See Comments)    Myalgias with this and rosuvastin    Immunization History  Administered Date(s) Administered   Fluad Quad(high Dose 65+) 01/07/2020, 03/01/2021   Hep A / Hep B 10/05/2013   Influenza Split 12/09/2010, 12/03/2011   Influenza Whole 12/25/2008, 12/10/2009   Influenza, Seasonal, Injecte, Preservative Fre  12/16/2014, 12/16/2015   Influenza,inj,Quad PF,6+ Mos 12/06/2012, 12/06/2013, 12/09/2016, 12/23/2017   PFIZER(Purple Top)SARS-COV-2 Vaccination 05/10/2019, 05/31/2019   Pneumococcal Conjugate-13 05/21/2015   Pneumococcal Polysaccharide-23 10/05/2013   Td 01/02/2001   Tdap 09/09/2010, 09/07/2016, 03/06/2022   Zoster Recombinat (Shingrix) 01/18/2019, 07/20/2019   Zoster, Live 12/06/2012    Past Medical History:  Diagnosis Date   Abdominal discomfort, epigastric 06/10/2017   Actinic keratoses 05/12/2014   Acute chest pain 12/20/2007   Acute prostatitis  06/10/2017   Classic presentation; will treat with sulfa x 2 weeks. He did well with this back in March   Allergic rhinitis 09/30/2006   Anemia    Benign essential hypertension 09/30/2006   BP Readings from Last 3 Encounters: 06/16/17 138/72  06/09/17 118/66  12/09/16 136/82   Reasonable control   Blood transfusion without reported diagnosis    40 yrs ago    BPH with obstruction/lower urinary tract symptoms 12/27/2018   Still with mild symptoms. If worsens, will need to go back to the urologist   CHF (congestive heart failure)    Chronic cough 07/16/2018   Fits definition of chronic bronchitis except not 2 years in a row. Seeing the pulmonary doctor. Doesn't feel the breo has helped   Extensor tendon laceration of right hand with open wound 09/16/2016   Fatigue 06/10/2017   Fatty liver 03/01/2010   LFTs normal. Still needs more weight loss   GERD (gastroesophageal reflux disease) 09/30/2006   Tried omeprazole and symptoms all came back. Will order the nexium again   Glaucoma    Hyperlipemia 09/30/2006   Inconsistent with the atorvastatin. Discussed---will take it twice a week   Hyperlipidemia    Hypogonadism in male 09/19/2009   Kidney stones    Laryngopharyngeal reflux (LPR) 06/03/2016   Left foot pain 09/20/2019   Major depressive disorder    Mild neurocognitive disorder 05/18/2020   OSA (obstructive sleep apnea) 09/30/2006   NPSG 07/2014:  AHI 23/hr, PLMS 452 with 12/hr with A/A NPSG 07/2014:  AHI 23/hr, PLMS 452 with 12/hr with A/A. The patient has moderate obstructive sleep apnea by his recent study, and is very symptomatic during the day. I have discussed with him the various treatment options, and have recommended a trial of C P   Shortness of breath 05/21/2015   09/11/17 Right and Left Heart cath: Right radial and brachial access.Left dominant coronary system with no CAD RHC with some exercise showed RA 15, 14 mm Hg (mean 12)PA 37/14  mean 27,PWP 17,15 mean 16   Type 2 diabetes  mellitus with neurological manifestations, uncontrolled 03/05/2009   HGBA1C 8.7 (A) 03/30/2018. Not much better. I want to send him to endocrine--he wants to hold off (doesn't want insulin, etc) Needs formal help---like Weight Watchers Will give him 3 months more   Vitamin B12 deficiency 06/16/2017   Does take supplement daily. Will check with full labs at next visit    Tobacco History: Social History   Tobacco Use  Smoking Status Former   Packs/day: 2.50   Years: 7.00   Additional pack years: 0.00   Total pack years: 17.50   Types: Cigarettes   Quit date: 03/18/1983   Years since quitting: 39.3   Passive exposure: Past  Smokeless Tobacco Never   Counseling given: Not Answered   Outpatient Medications Prior to Visit  Medication Sig Dispense Refill   ALPRAZolam (XANAX) 0.5 MG tablet TAKE 1 TABLET BY MOUTH THREE TIMES A DAY AS NEEDED 90 tablet 0   aspirin 81  MG chewable tablet Chew by mouth.     benzonatate (TESSALON) 200 MG capsule TAKE 1 CAPSULE (200 MG TOTAL) BY MOUTH 3 (THREE) TIMES DAILY AS NEEDED FOR COUGH. 60 capsule 0   Blood Glucose Calibration (ONETOUCH VERIO) SOLN AS DIRECTED 1 each 1   Blood Glucose Monitoring Suppl (ONETOUCH VERIO FLEX SYSTEM) w/Device KIT Use as directed,once daily to test blood sugar 1 kit 0   BYDUREON BCISE 2 MG/0.85ML AUIJ INJECT 2 MG INTO THE SKIN ONCE A WEEK. 10.2 mL 11   cyclobenzaprine (FLEXERIL) 10 MG tablet Take by mouth.     dapagliflozin propanediol (FARXIGA) 10 MG TABS tablet Take 1 tablet (10 mg total) by mouth daily before breakfast. 90 tablet 3   esomeprazole (NEXIUM) 20 MG capsule Take 40 mg by mouth 2 (two) times daily before a meal.     finasteride (PROSCAR) 5 MG tablet TAKE 1 TABLET BY MOUTH EVERY DAY (Patient taking differently: Take 5 mg by mouth daily.) 90 tablet 3   glipiZIDE (GLUCOTROL XL) 10 MG 24 hr tablet TAKE 1 TABLET (10 MG TOTAL) BY MOUTH DAILY WITH BREAKFAST. 90 tablet 3   glucose blood (ONETOUCH VERIO) test strip CHECK  BLOOD SUGAR EVERY DAY. E11.42 50 strip 11   hydrocortisone 2.5 % cream APPLY TOPICALLY 3 TIMES DAILY AS NEEDED. 28 g 3   ketorolac (ACULAR) 0.5 % ophthalmic solution USE AS DIRECTED 5 mL 1   metFORMIN (GLUCOPHAGE-XR) 500 MG 24 hr tablet TAKE 2 TABLETS (1,000 MG TOTAL) BY MOUTH IN THE MORNING AND AT BEDTIME. 360 tablet 3   Meth-Hyo-M Bl-Na Phos-Ph Sal (URO-MP) 118 MG CAPS Take 1 capsule by mouth every 6 (six) hours as needed.     ONETOUCH DELICA LANCETS FINE MISC 1 Units by Does not apply route daily. 100 each 2   rosuvastatin (CRESTOR) 10 MG tablet TAKE 1 TABLET BY MOUTH EVERY DAY 90 tablet 3   tamsulosin (FLOMAX) 0.4 MG CAPS capsule Take 2 capsules (0.8 mg total) by mouth daily. Patient reports taking 2 0.4mg  capsules. 180 capsule 3   urea (CARMOL) 40 % CREA APPLY PEA SIZED AMOUNT TO HARD AREA BELOW PINKY TOE TWICE A DAY 28.35 g 1   vitamin B-12 (CYANOCOBALAMIN) 1000 MCG tablet Take 1 tablet (1,000 mcg total) by mouth daily.     nitroGLYCERIN (NITROSTAT) 0.4 MG SL tablet Place under the tongue.     No facility-administered medications prior to visit.     Review of Systems:   Constitutional: No weight loss or gain, night sweats, fevers, chills, fatigue, or lassitude. HEENT: No headaches, difficulty swallowing, tooth/dental problems, or sore throat. No sneezing, itching, ear ache, nasal congestion, or post nasal drip.  CV:  No chest pain, orthopnea, PND, swelling in lower extremities, anasarca, dizziness, palpitations, syncope Resp: No shortness of breath with exertion or at rest. No excess mucus or change in color of mucus. No productive or non-productive. No hemoptysis. No wheezing.  No chest wall deformity GI:  No heartburn, indigestion, abdominal pain, nausea, vomiting, diarrhea, change in bowel habits, loss of appetite, bloody stools.  GU: +nocturia. No dysuria, change in color of urine, urgency or frequency.  Skin: No rash, lesions, ulcerations MSK:  No joint pain or swelling.   Neuro:  No dizziness or lightheadedness. +memory impairment Psych: No depression or anxiety. Mood stable.     Physical Exam:  BP 138/80   Pulse 69   Temp 98.2 F (36.8 C) (Oral)   Ht  (1.93 m)  Wt 240 lb 12.8 oz (109.2 kg)   SpO2 98%   BMI 29.31 kg/m   GEN: Pleasant, interactive, well-appearing; in no acute distress. HEENT:  Normocephalic and atraumatic. PERRLA. Sclera white. Nasal turbinates pink, moist and patent bilaterally. No rhinorrhea present. Oropharynx pink and moist, without exudate or edema. No lesions, ulcerations, or postnasal drip. Mallampati III NECK:  Supple w/ fair ROM. No JVD present. Normal carotid impulses w/o bruits. Thyroid symmetrical with no goiter or nodules palpated. No lymphadenopathy.   CV: RRR, no m/r/g, no peripheral edema. Pulses intact, +2 bilaterally. No cyanosis, pallor or clubbing. PULMONARY:  Unlabored, regular breathing. Clear bilaterally A&P w/o wheezes/rales/rhonchi. No accessory muscle use.  GI: BS present and normoactive. Soft, non-tender to palpation. No organomegaly or masses detected.  MSK: No erythema, warmth or tenderness. Cap refil <2 sec all extrem. No deformities or joint swelling noted.  Neuro: A/Ox3. No focal deficits noted.   Skin: Warm, no lesions or rashe Psych: Normal affect and behavior. Judgement and thought content appropriate.     Lab Results:  CBC    Component Value Date/Time   WBC 4.3 03/18/2022 0945   RBC 4.27 03/18/2022 0945   HGB 12.8 (L) 03/18/2022 0945   HCT 36.8 (L) 03/18/2022 0945   PLT 206.0 03/18/2022 0945   MCV 86.1 03/18/2022 0945   MCV 90.5 07/09/2013 1608   MCH 28.9 07/09/2013 1608   MCH 30.0 09/13/2010 1605   MCHC 34.7 03/18/2022 0945   RDW 13.7 03/18/2022 0945   LYMPHSABS 2.8 06/09/2017 1107   MONOABS 0.4 06/09/2017 1107   EOSABS 0.1 06/09/2017 1107   BASOSABS 0.0 06/09/2017 1107    BMET    Component Value Date/Time   NA 141 03/18/2022 0945   K 4.4 03/18/2022 0945   CL 104 03/18/2022  0945   CO2 28 03/18/2022 0945   GLUCOSE 155 (H) 03/18/2022 0945   BUN 16 03/18/2022 0945   CREATININE 1.02 03/18/2022 0945   CREATININE 0.92 07/09/2013 1635   CALCIUM 9.1 03/18/2022 0945   GFRNONAA >60 09/13/2010 1605   GFRAA >60 09/13/2010 1605    BNP No results found for: "BNP"   Imaging:  No results found.        No data to display          No results found for: "NITRICOXIDE"      Assessment & Plan:   OSA (obstructive sleep apnea) Severe OSA, now on CPAP therapy. He has excellent control on current settings. He is having some large leaks on his download but these do not seem to affect his sleep or residual AHI. He is wearing a DreamWear full face mask. Receiving good benefit from use. Encouraged him to continue working on nightly use. Cautioned on safe driving practices. Understands risks of untreated OSA.  Patient Instructions  Continue to use CPAP every night, minimum of 4-6 hours a night.  Change equipment every 30 days or as directed by DME. Wash your tubing with warm soap and water daily, hang to dry. Wash humidifier portion weekly.  Be aware of reduced alertness and do not drive or operate heavy machinery if experiencing this or drowsiness.  Exercise encouraged, as tolerated. Notify if persistent daytime sleepiness occurs even with consistent use of CPAP.  We discussed how untreated sleep apnea puts an individual at risk for cardiac arrhthymias, pulm HTN, DM, stroke and increases their risk for daytime accidents. We also briefly reviewed treatment options including weight loss, side sleeping position, oral appliance, CPAP therapy  or referral to ENT for possible surgical options  Follow up in 6 months with Dr. Wynona Neat or Philis Nettle, or sooner as needed   I spent 28 minutes of dedicated to the care of this patient on the date of this encounter to include pre-visit review of records, face-to-face time with the patient discussing conditions above, post visit  ordering of testing, clinical documentation with the electronic health record, making appropriate referrals as documented, and communicating necessary findings to members of the patients care team.  Noemi Chapel, NP 07/10/2022  Pt aware and understands NP's role.

## 2022-07-10 NOTE — Assessment & Plan Note (Addendum)
Severe OSA, now on CPAP therapy. He has excellent control on current settings. He is having some large leaks on his download but these do not seem to affect his sleep or residual AHI. He is wearing a DreamWear full face mask. Receiving good benefit from use. Encouraged him to continue working on nightly use. Cautioned on safe driving practices. Understands risks of untreated OSA.  Patient Instructions  Continue to use CPAP every night, minimum of 4-6 hours a night.  Change equipment every 30 days or as directed by DME. Wash your tubing with warm soap and water daily, hang to dry. Wash humidifier portion weekly.  Be aware of reduced alertness and do not drive or operate heavy machinery if experiencing this or drowsiness.  Exercise encouraged, as tolerated. Notify if persistent daytime sleepiness occurs even with consistent use of CPAP.  We discussed how untreated sleep apnea puts an individual at risk for cardiac arrhthymias, pulm HTN, DM, stroke and increases their risk for daytime accidents. We also briefly reviewed treatment options including weight loss, side sleeping position, oral appliance, CPAP therapy or referral to ENT for possible surgical options  Follow up in 6 months with Dr. Wynona Neat or Philis Nettle, or sooner as needed

## 2022-07-10 NOTE — Patient Instructions (Addendum)
Continue to use CPAP every night, minimum of 4-6 hours a night.  Change equipment every 30 days or as directed by DME. Wash your tubing with warm soap and water daily, hang to dry. Wash humidifier portion weekly.  Be aware of reduced alertness and do not drive or operate heavy machinery if experiencing this or drowsiness.  Exercise encouraged, as tolerated. Notify if persistent daytime sleepiness occurs even with consistent use of CPAP.  We discussed how untreated sleep apnea puts an individual at risk for cardiac arrhthymias, pulm HTN, DM, stroke and increases their risk for daytime accidents. We also briefly reviewed treatment options including weight loss, side sleeping position, oral appliance, CPAP therapy or referral to ENT for possible surgical options  Follow up in 6 months with Dr. Wynona Neat or Philis Nettle, or sooner as needed

## 2022-07-11 ENCOUNTER — Ambulatory Visit: Payer: Medicare HMO | Admitting: Psychology

## 2022-07-11 ENCOUNTER — Encounter: Payer: Self-pay | Admitting: Psychology

## 2022-07-11 ENCOUNTER — Ambulatory Visit: Payer: Medicare HMO

## 2022-07-11 DIAGNOSIS — G4733 Obstructive sleep apnea (adult) (pediatric): Secondary | ICD-10-CM | POA: Diagnosis not present

## 2022-07-11 DIAGNOSIS — R4189 Other symptoms and signs involving cognitive functions and awareness: Secondary | ICD-10-CM

## 2022-07-11 DIAGNOSIS — G3184 Mild cognitive impairment, so stated: Secondary | ICD-10-CM | POA: Diagnosis not present

## 2022-07-11 NOTE — Progress Notes (Addendum)
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 validity of neuropsychological testing is limited by the extent to which the individual being tested may be assumed to have exerted adequate effort during testing. Mr. Lyng expressed his intention to perform to the best of his abilities and exhibited adequate task engagement and persistence. Scores across stand-alone and embedded performance validity measures were within expectation. As such, the results of the current evaluation are believed to be a valid representation of Mr. Tennenbaum current cognitive functioning.  Test Results: Mr. Lavis was mildly disoriented at the time of the current evaluation. He incorrectly stated the current year ("2023") and was two days off when stating the current date.  Intellectual abilities based upon educational and vocational attainment were estimated to be in the average range. Premorbid abilities were estimated to be within the average range based upon a single-word reading test.   Processing speed was average to above average. Basic attention was average. More complex attention (e.g., working memory) was also average. Executive functioning was largely exceptionally low to well below  average. He did perform in the average range across a task assessing verbal reasoning, as well as another task assessing safety and judgment.  Assessed receptive language abilities were well below average. He primarily exhibited difficulties understanding complex sentence structure, as well as following multi-step commands. Despite this, Mr. Dearment did not exhibit any difficulties comprehending task instructions and answered all questions asked of him appropriately during interview. This may represent more of a normative impairment than a clinical weakness. Assessed expressive language (e.g., verbal fluency and confrontation naming) was below average to average.     Assessed visuospatial/visuoconstructional abilities were below average to average.    Learning (i.e., encoding) of novel verbal information was well below average. Spontaneous delayed recall (i.e., retrieval) of previously learned information was exceptionally low. Retention rates were 29% across a story learning task, 0% across a list learning task, and 6% across a figure drawing task. Performance across recognition tasks was variable, ranging from the exceptionally low to average normative ranges, suggesting some limited evidence for information consolidation.   Results of emotional screening instruments suggested that recent symptoms  of generalized anxiety were in the mild range, while symptoms of depression were within normal limits. A screening instrument assessing recent sleep quality suggested the presence of mild sleep dysfunction.  Tables of Scores:   Note: This summary of test scores accompanies the interpretive report and should not be considered in isolation without reference to the appropriate sections in the text. Descriptors are based on appropriate normative data and may be adjusted based on clinical judgment. Terms such as "Within Normal Limits" and "Outside Normal Limits" are used when a more specific description of the test  score cannot be determined. Descriptors refer to the current evaluation only.         Percentile - Normative Descriptor > 98 - Exceptionally High 91-97 - Well Above Average 75-90 - Above Average 25-74 - Average 9-24 - Below Average 2-8 - Well Below Average < 2 - Exceptionally Low        Orientation: March 2022 Current     Raw Score Raw Score Percentile   NAB Orientation, Form 1 26/29 26/29 --- ---        Cognitive Screening:       Raw Score Raw Score Percentile   SLUMS: 23/30 17/30 --- ---        RBANS, Form A: Standard Score/ Scaled Score Standard Score/ Scaled Score Percentile   Total Score 85 74 4 Well Below Average  Immediate Memory 81 73 4 Well Below Average    List Learning 6 5 5  Well Below Average    Story Memory 7 5 5  Well Below Average  Visuospatial/Constructional 89 96 39 Average    Figure Copy 9 10 50 Average    Line Orientation 15/20 16/20 26-50 Average  Language 105 92 30 Average    Picture Naming 10/10 9/10 26-50 Average    Semantic Fluency 11 7 16  Below Average  Attention 88 88 21 Below Average    Digit Span 8 8 25  Average    Coding 8 8 25  Average  Delayed Memory 85 48 <1 Exceptionally Low    List Recall 1/10 0/10 <2 Exceptionally Low    List Recognition 19/20 15/20 <2 Exceptionally Low    Story Recall 3 3 1  Exceptionally Low    Story Recognition 9/12 9/12 16-26 Below Average to Average    Figure Recall 6 2 <1 Exceptionally Low    Figure Recognition 5/8 5/8 21-29 Below Average to Average         Intellectual Functioning:       Standard Score Standard Score Percentile   Test of Premorbid Functioning: 95 90 25 Average        Attention/Executive Function:      Trail Making Test (TMT): Raw Score (T Score) Raw Score (T Score) Percentile     Part A 30 secs.,  0 errors (53) 33 secs.,  0 errors (53) 62 Average    Part B 153 secs.,  3 errors (39) 237 secs.,  5 errors (30) 2 Well Below Average          Scaled Score Scaled Score Percentile   WAIS-IV  Digit Span: --- 10 50 Average    Forward --- 10 50 Average    Backward --- 11 63 Average    Sequencing --- 9 37 Average         Scaled Score Scaled Score Percentile   WAIS-IV Similarities: 9 10 50 Average        D-KEFS Color-Word Interference Test: Raw Score (Scaled Score) Raw Score (Scaled  Score) Percentile     Color Naming 27 secs. (13) 25 secs. (13) 84 Above Average    Word Reading 20 secs. (13) 20 secs. (13) 84 Above Average    Inhibition 59 secs. (12) 55 secs. (13) 84 Above Average      Total Errors 4 errors (9) 31 errors (1) <1 Exceptionally Low    Inhibition/Switching 58 secs. (13) Discontinued --- Impaired      Total Errors 17 errors (1) --- --- ---        D-KEFS Verbal Fluency Test: Raw Score (Scaled Score) Raw Score (Scaled Score) Percentile     Letter Total Correct --- 29 (8) 25 Average    Category Total Correct --- 34 (10) 50 Average    Category Switching Total Correct --- 7 (4) 2 Well Below Average    Category Switching Accuracy --- 1 (1) <1 Exceptionally Low      Total Set Loss Errors --- 3 (9) 37 Average      Total Repetition Errors --- 5 (8) 25 Average        NAB Executive Functions Module, Form 1: T Score T Score Percentile     Judgment 67 56 73 Average        Language:      Verbal Fluency Test: Raw Score (T Score) Raw Score (T Score) Percentile     Phonemic Fluency (FAS) 32 (43) 29 (43) 25 Average    Animal Fluency 18 (49) 15 (45) 31 Average         NAB Language Module, Form 1: T Score T Score Percentile     Auditory Comprehension --- 35 7 Well Below Average    Naming 30/31 (57) 29/31 (46) 34 Average        Visuospatial/Visuoconstruction:       Raw Score Raw Score Percentile   Clock Drawing: 8/10 10/10 --- Within Normal Limits         Scaled Score Scaled Score Percentile   WAIS-IV Block Design: --- 7 16 Below Average        Mood and Personality:       Raw Score Raw Score Percentile   Geriatric Depression Scale: 8 8 --- Within Normal Limits   Geriatric Anxiety Scale: --- 12 --- Mild    Somatic --- 2 --- Minimal    Cognitive --- 6 --- Mild    Affective --- 4 --- Mild        Additional Questionnaires:       Raw Score Raw Score Percentile   PROMIS Sleep Disturbance Questionnaire: 24 27 --- Mild   Informed Consent and Coding/Compliance:   The current evaluation represents a clinical evaluation for the purposes previously outlined by the referral source and is in no way reflective of a forensic evaluation.   Mr. Alamos was provided with a verbal description of the nature and purpose of the present neuropsychological evaluation. Also reviewed were the foreseeable risks and/or discomforts and benefits of the procedure, limits of confidentiality, and mandatory reporting requirements of this provider. The patient was given the opportunity to ask questions and receive answers about the evaluation. Oral consent to participate was provided by the patient.   This evaluation was conducted by Newman Nickels, Ph.D., ABPP-CN, board certified clinical neuropsychologist. Mr. Mcsweeney completed a clinical interview with Dr. Milbert Coulter, billed as one unit [phone], and 130 minutes of cognitive testing and scoring, billed as one unit [phone] and three additional units 96139. Psychometrist Shan Levans, B.S., assisted Dr. Milbert Coulter with test  administration and scoring procedures. As a separate and discrete service, Dr. Melvyn Novas spent a total of 160 minutes in interpretation and report writing billed as one unit [phone] and two units 96133.

## 2022-07-11 NOTE — Progress Notes (Signed)
   Psychometrician Note   Cognitive testing was administered to Alejandro Koch by Shan Levans, B.S. (psychometrist) under the supervision of Dr. Newman Nickels, Ph.D., licensed psychologist on 07/11/2022. Mr. Brow did not appear overtly distressed by the testing session per behavioral observation or responses across self-report questionnaires. Rest breaks were offered.    The battery of tests administered was selected by Dr. Newman Nickels, Ph.D. with consideration to Mr. Meegan current level of functioning, the nature of his symptoms, emotional and behavioral responses during interview, level of literacy, observed level of motivation/effort, and the nature of the referral question. This battery was communicated to the psychometrist. Communication between Dr. Newman Nickels, Ph.D. and the psychometrist was ongoing throughout the evaluation and Dr. Newman Nickels, Ph.D. was immediately accessible at all times. Dr. Newman Nickels, Ph.D. provided supervision to the psychometrist on the date of this service to the extent necessary to assure the quality of all services provided.    Alejandro Koch will return within approximately 1-2 weeks for an interactive feedback session with Dr. Milbert Coulter at which time his test performances, clinical impressions, and treatment recommendations will be reviewed in detail. Mr. Katzenstein understands he can contact our office should he require our assistance before this time.  A total of 130 minutes of billable time were spent face-to-face with Mr. Puthoff by the psychometrist. This includes both test administration and scoring time. Billing for these services is reflected in the clinical report generated by Dr. Newman Nickels, Ph.D.  This note reflects time spent with the psychometrician and does not include test scores or any clinical interpretations made by Dr. Milbert Coulter. The full report will follow in a separate note.

## 2022-07-15 ENCOUNTER — Ambulatory Visit: Payer: Medicare HMO | Admitting: Nurse Practitioner

## 2022-07-21 ENCOUNTER — Ambulatory Visit: Payer: Medicare HMO | Admitting: Psychology

## 2022-07-21 DIAGNOSIS — G3184 Mild cognitive impairment, so stated: Secondary | ICD-10-CM | POA: Diagnosis not present

## 2022-07-21 NOTE — Progress Notes (Signed)
   Neuropsychology Feedback Session [name]. [facility] Department of Neurology  Reason for Referral:   Alejandro Koch is a 72 y.o. right-handed Caucasian male referred by Patrcia Dolly, M.D., to characterize his current cognitive functioning and assist with diagnostic clarity and treatment planning in the context of subjective cognitive decline.   Feedback:   Alejandro Koch completed a comprehensive neuropsychological evaluation on 07/11/2022. Please refer to that encounter for the full report and recommendations. Briefly, results suggested primary impairments surrounding executive functioning (namely cognitive flexibility and response inhibition) and both encoding (i.e., learning) and delayed retrieval aspects of memory. Relative to his previous evaluation in March 2022, some cognitive decline was observed. This was most prominent across both encoding and retrieval aspects of memory (with the latter being more notable). Decline was also seen across a list learning recognition task, while two other similar performances were stable. Decline was further exhibited across executive functioning (i.e., cognitive flexibility and response inhibition), as well as semantic fluency. Decline across the latter domain was far more subtle relative to other domains described above. The etiology for ongoing dysfunction and objective decline remains unclear. Evidence for progressive cognitive decline does raise concerns for the early stages of a neurodegenerative illness. Adding to this concern is that decline was observed in the context of much improved CPAP use and diminished Xanax reliance. Across memory testing, Alejandro Koch did not strongly benefit from repeated exposure to new information across learning trials and delayed retention rates ranged from 0% to 29%, raising concerns for some rapid forgetting and the potential for underlying Alzheimer's disease. Recognition trials were variable but 2/3 of these  tasks showed appropriate and stable performances, suggesting that there is not yet evidence for a prominent storage impairment. Intact confrontation naming abilities is also encouraging from an Alzheimer's disease perspective. Continued medical monitoring will be important moving forward.   Alejandro Koch was accompanied by his wife during the current feedback session. Content of the current session focused on the results of his neuropsychological evaluation. Alejandro Koch was given the opportunity to ask questions and his questions were answered. He was encouraged to reach out should additional questions arise. A copy of his report was provided at the conclusion of the visit.      One unit [phone] was billed for Dr. Tammy Sours time spent preparing for, conducting, and documenting the current feedback session with Alejandro Koch.

## 2022-08-06 DIAGNOSIS — H401111 Primary open-angle glaucoma, right eye, mild stage: Secondary | ICD-10-CM | POA: Diagnosis not present

## 2022-08-06 LAB — HM DIABETES EYE EXAM

## 2022-08-08 ENCOUNTER — Encounter: Payer: Self-pay | Admitting: Neurology

## 2022-08-08 ENCOUNTER — Ambulatory Visit: Payer: Medicare HMO | Admitting: Neurology

## 2022-08-08 VITALS — BP 117/69 | HR 91 | Ht 77.0 in | Wt 240.2 lb

## 2022-08-08 DIAGNOSIS — G3184 Mild cognitive impairment, so stated: Secondary | ICD-10-CM

## 2022-08-08 DIAGNOSIS — I679 Cerebrovascular disease, unspecified: Secondary | ICD-10-CM

## 2022-08-08 DIAGNOSIS — R42 Dizziness and giddiness: Secondary | ICD-10-CM

## 2022-08-08 MED ORDER — DONEPEZIL HCL 5 MG PO TABS: ORAL_TABLET | ORAL | 6 refills | Status: DC

## 2022-08-08 NOTE — Progress Notes (Signed)
NEUROLOGY FOLLOW UP OFFICE NOTE  Alejandro Koch [id number] [birth year]  HISTORY OF PRESENT ILLNESS: I had the pleasure of seeing Alejandro Koch in follow-up in the neurology clinic on 08/08/2022.  The patient was last seen 4 months ago for memory loss and dizziness. He is again accompanied by his wife who helps supplement the history today.  Records and images were personally reviewed where available. He has started using his CPAP machine and has stopped Xanax intake.  He underwent repeat Neuropsychological testing on 06/2022 with a diagnosis of Mild Neurocognitive Disorder. Primary impairments were with executive functioning and both encoding and delayed retrieval aspects of memory. Compared to prior testing in March 2022, there was some cognitive decline observed. Etiology remains unclear, decline raises concern for early stages of a neurodegenerative illness. He continues to have dizziness, although not as frequent as before. They are not necessarily with positional changes, dizziness can occur while sitting watching TV. He has not noticed if it occurs when supine. He feels it in his head, he states it is not vertigo but more balance. He was walking the dog last week and "it just hit me." He denies any recent falls. He reports hearing loss in the left ear. He has ringing in his ears with both low-pitched and high-pitched tinnitus.    Since his last visit, they report daily episodes of dizziness. On further questioning, he reports that he feels it more when he extends his neck/puts head back or when he is looking up. He would feel a little dizzy, he flexes his neck down and waits a minute and symptoms go away. It can happen when he is sitting and sits back on his recliner or when watching TV, he would bend head down and feel better. Vision still gets blurred sometimes, no loss of peripheral vision. No headache/head pain, but he has occasional episodes of a sensation localized over the vertex where he feels  numbness and burning, not clearly associated with the dizziness. He has seen Sleep Medicine for severe OSA, he agreed to restart CPAP but has not called DME company for his machine. He has not started the aspirin. His wife has noticed a big change in his memory in the past month. 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 He gets everything confused, his desk is a mess. He forgets where he puts things. Benicar is no longer on his list, he has been taking 1/4 tablet daily but will stop it now that pharmacy reported he has no refills.    History on Initial Assessment 05/15/2020: This is a 72 year old right-handed man with a history of hypertension, hyperlipidemia, OSA, diabetes, anxiety, depression, presenting for evaluation of memory loss. He feels hie memory is not like it's supposed to be, he used to be pretty sharp and great with numbers. He lives with his wife. His wife started noticing changes about a year ago, he started having mood changes, a lot of edgy when he is usually even-keel. He was losing things frequently and appetite was not like it was. He was losing weight and reporting dizziness. He would repeat himself and get frustrated easily. She noticed his attention span would not be longer than 10-15 minutes, he would get easily distracted. 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 He has occasional word-finding difficulties when he used to be pretty accurate. He denies getting lost driving but has trouble remembering directions  to the beach and questions himself more now. He denies missing medications or bill payments. His mother had Alzheimer's disease, his father had memory issues. He rarely drinks alcohol. He had a fall last Christmas where he lost consciousness. No neurosurgical procedures. He thinks his mood is better, he was in a very stressful work environment but did notice he used to be more tolerant but is now more  irritable. His wife denies any paranoia or hallucinations.  He states he never had headaches in the past, but over the last 3 months he has had a low grade frontal headache that would occur infrequently. No associated nausea/vomiting. He has dizziness when sitting but more when standing. He describes a spinning sensation where he has to hold on for a few seconds to get his bearings, no falls. He denies any diplopia, dysarthria/dysphagia, neck/back pain, focal numbness/tingling/weakness, bowel/bladder dysfunction, anosmia, or tremors. Sleep is good with Xanax every night.   Diagnostic Data: MRI brain without contrast done 02/2020 did not show any acute changes, there was mild diffuse atrophy.  Repeat brain MRI without contrast done 02/2022 no acute changes, there was mild diffuse atrophy, minimal chronic microvascular disease, right maxillary sinusitis.   MRA head and neck on 03/24/22 showed focal severe proximal left P2 stenosis, otherwise patent distally, short-segment mild moderate distal right P2 stenosis, patent to distal aspects. There was a dominant left vertebral artery widely patent to the vertebrobasilar junction, hypoplastic right vertebral artery, both PICA patent. Basilar patent to distal aspect without stenosis.   Neuropsychological testing in 05/2020: diagnosis of Mild Neurocognitive Disorder, etiology unclear. Findings were not consistent with Alzheimer's disease, most likely culprit felt to be a combination of sleep dysfunction, Xanax side effects, and cardiovascular disease, various psychosocial stressors.  Repeat Neuropsychological testing on 06/2022: diagnosis of Mild Neurocognitive Disorder. Primary impairments were with executive functioning and both encoding and delayed retrieval aspects of memory. Compared to prior testing in March 2022, there was some cognitive decline observed. Etiology remains unclear, decline raises concern for early stages of a neurodegenerative  illness.  Laboratory Data: Lab Results  Component Value Date   TSH 2.24 06/09/2017   Lab Results  Component Value Date   VITAMINB12 552 08/29/2020    PAST MEDICAL HISTORY: Past Medical History:  Diagnosis Date   Abdominal discomfort, epigastric 06/10/2017   Actinic keratoses 05/12/2014   Acute chest pain 12/20/2007   Acute prostatitis 06/10/2017   Classic presentation; will treat with sulfa x 2 weeks. He did well with this back in March   Allergic rhinitis 09/30/2006   Anemia    Benign essential hypertension 09/30/2006   BP Readings from Last 3 Encounters: 06/16/17 138/72  06/09/17 118/66  12/09/16 136/82   Reasonable control   Blood transfusion without reported diagnosis    40 yrs ago    BPH with obstruction/lower urinary tract symptoms 12/27/2018   Still with mild symptoms. If worsens, will need to go back to the urologist   CHF (congestive heart failure) (HCC)    Chronic cough 07/16/2018   Fits definition of chronic bronchitis except not 2 years in a row. Seeing the pulmonary doctor. Doesn't feel the breo has helped   Disequilibrium 06/20/2022   Extensor tendon laceration of right hand with open wound 09/16/2016   Fatigue 06/10/2017   Fatty liver 03/01/2010   LFTs normal. Still needs more weight loss   GERD (gastroesophageal reflux disease) 09/30/2006   Tried omeprazole and symptoms all came back. Will order the nexium again  Glaucoma    Hyperlipemia 09/30/2006   Inconsistent with the atorvastatin. Discussed---will take it twice a week   Hyperlipidemia    Hypogonadism in male 09/19/2009   Kidney stones    Laryngopharyngeal reflux (LPR) 06/03/2016   Left foot pain 09/20/2019   Major depressive disorder    Mild neurocognitive disorder 05/18/2020   OSA (obstructive sleep apnea) 09/30/2006   NPSG 07/2014:  AHI 23/hr, PLMS 452 with 12/hr with A/A NPSG 07/2014:  AHI 23/hr, PLMS 452 with 12/hr with A/A. The patient has moderate obstructive sleep apnea by his recent study,  and is very symptomatic during the day. I have discussed with him the various treatment options, and have recommended a trial of C P   Sensorineural hearing loss (SNHL) of both ears 06/20/2022   Shortness of breath 05/21/2015   09/11/17 Right and Left Heart cath: Right radial and brachial access.Left dominant coronary system with no CAD RHC with some exercise showed RA 15, 14 mm Hg (mean 12)PA 37/14  mean 27,PWP 17,15 mean 16   Skin lesion 11/06/2021   Tinnitus of both ears 06/20/2022   Type 2 DM with diabetic neuropathy affecting both sides of body (HCC) 03/05/2009   HGBA1C 8.7 (A) 03/30/2018. Not much better. I want to send him to endocrine--he wants to hold off (doesn't want insulin, etc) Needs formal help---like Weight [employer] Will give him 3 months more   Vitamin B12 deficiency 06/16/2017   Does take supplement daily. Will check with full labs at next visit    MEDICATIONS: Current Outpatient Medications on File Prior to Visit  Medication Sig Dispense Refill   ALPRAZolam (XANAX) 0.5 MG tablet TAKE 1 TABLET BY MOUTH THREE TIMES A DAY AS NEEDED 90 tablet 0   aspirin 81 MG chewable tablet Chew by mouth.     benzonatate (TESSALON) 200 MG capsule TAKE 1 CAPSULE (200 MG TOTAL) BY MOUTH 3 (THREE) TIMES DAILY AS NEEDED FOR COUGH. 60 capsule 0   Blood Glucose Calibration (ONETOUCH VERIO) SOLN AS DIRECTED 1 each 1   Blood Glucose Monitoring Suppl (ONETOUCH VERIO FLEX SYSTEM) w/Device KIT Use as directed,once daily to test blood sugar 1 kit 0   BYDUREON BCISE 2 MG/0.85ML AUIJ INJECT 2 MG INTO THE SKIN ONCE A WEEK. 10.2 mL 11   cyclobenzaprine (FLEXERIL) 10 MG tablet Take by mouth.     dapagliflozin propanediol (FARXIGA) 10 MG TABS tablet Take 1 tablet (10 mg total) by mouth daily before breakfast. 90 tablet 3   esomeprazole (NEXIUM) 20 MG capsule Take 40 mg by mouth 2 (two) times daily before a meal.     finasteride (PROSCAR) 5 MG tablet TAKE 1 TABLET BY MOUTH EVERY DAY (Patient taking differently:  Take 5 mg by mouth daily.) 90 tablet 3   glipiZIDE (GLUCOTROL XL) 10 MG 24 hr tablet TAKE 1 TABLET (10 MG TOTAL) BY MOUTH DAILY WITH BREAKFAST. 90 tablet 3   glucose blood (ONETOUCH VERIO) test strip CHECK BLOOD SUGAR EVERY DAY. E11.42 50 strip 11   hydrocortisone 2.5 % cream APPLY TOPICALLY 3 TIMES DAILY AS NEEDED. 28 g 3   ketorolac (ACULAR) 0.5 % ophthalmic solution USE AS DIRECTED 5 mL 1   metFORMIN (GLUCOPHAGE-XR) 500 MG 24 hr tablet TAKE 2 TABLETS (1,000 MG TOTAL) BY MOUTH IN THE MORNING AND AT BEDTIME. 360 tablet 3   Meth-Hyo-M Bl-Na Phos-Ph Sal (URO-MP) 118 MG CAPS Take 1 capsule by mouth every 6 (six) hours as needed.     nitroGLYCERIN (NITROSTAT) 0.4 MG  SL tablet Place under the tongue.     ONETOUCH DELICA LANCETS FINE MISC 1 Units by Does not apply route daily. 100 each 2   rosuvastatin (CRESTOR) 10 MG tablet TAKE 1 TABLET BY MOUTH EVERY DAY 90 tablet 3   tamsulosin (FLOMAX) 0.4 MG CAPS capsule Take 2 capsules (0.8 mg total) by mouth daily. Patient reports taking 2 0.4mg  capsules. 180 capsule 3   urea (CARMOL) 40 % CREA APPLY PEA SIZED AMOUNT TO HARD AREA BELOW PINKY TOE TWICE A DAY 28.35 g 1   vitamin B-12 (CYANOCOBALAMIN) 1000 MCG tablet Take 1 tablet (1,000 mcg total) by mouth daily.     No current facility-administered medications on file prior to visit.    ALLERGIES: Allergies  Allergen Reactions   Iohexol      Code: HIVES, Desc: PT IS ALLERGIC TO IVP DYE AS NOTED IN PREVIOUS RECORDS IN PACS 02/2009/RM, Onset Date: [id number]    Atorvastatin Other (See Comments)    Myalgias with this and rosuvastin    FAMILY HISTORY: Family History  Problem Relation Age of Onset   Alzheimer's disease Mother    Heart disease Father    COPD Father    Colon cancer Father 41   Prostate cancer Father    Dementia Father    Colon polyps Brother    Alzheimer's disease Maternal Grandmother    Leukemia Brother    Leukemia Other    Stomach cancer Neg Hx    Esophageal cancer Neg Hx     Rectal cancer Neg Hx     SOCIAL HISTORY: Social History   Socioeconomic History   Marital status: Married    Spouse name: Not on file   Number of children: 1   Years of education: 14   Highest education level: Associate degree: academic program  Occupational History   Occupation: Retired    Comment: EMS, Network engineer  Tobacco Use   Smoking status: Former    Packs/day: 2.50    Years: 7.00    Additional pack years: 0.00    Total pack years: 17.50    Types: Cigarettes    Quit date: 03/18/1983    Years since quitting: 39.4    Passive exposure: Past   Smokeless tobacco: Never  Vaping Use   Vaping Use: Never used  Substance and Sexual Activity   Alcohol use: Not Currently    Comment: occasional glass of wine   Drug use: No   Sexual activity: Not on file  Other Topics Concern   Not on file  Social History Narrative   No living will or health care POA thus far   Wife, then son, should make decisions. May make son the primary person   Would accept resuscitation attempts but wouldn't want to be on machines   No tube feeds if cognitively unaware   Right handed   Social Determinants of Health   Financial Resource Strain: Not on file  Food Insecurity: Not on file  Transportation Needs: Not on file  Physical Activity: Not on file  Stress: Not on file  Social Connections: Not on file  Intimate Partner Violence: Not on file     PHYSICAL EXAM: Vitals:   08/08/22 1011  BP: 117/69  Pulse: 91  SpO2: 96%   General: No acute distress Head:  Normocephalic/atraumatic Skin/Extremities: No rash, no edema Neurological Exam: alert and awake. No aphasia or dysarthria. Fund of knowledge is appropriate.  Recent and remote memory are impaired, needs repeated explanation about plan, he repeatedly  mentions his [facility] cardiologist Dr. Allena Katz. Attention and concentration are reduced.   Cranial nerves: Pupils equal, round. Extraocular movements intact.  No facial asymmetry.  Motor: moves all  extremities symmetrically. Gait narrow-based and steady, no ataxia.   IMPRESSION: This is a 72 yo RH man with a history of hypertension, hyperlipidemia, OSA, diabetes, anxiety, depression, with dizziness and Mild Neurocognitive disorder.  Mild Cognitive Impairment. Repeat Neuropsychological testing on 06/2022 again with a diagnosis of Mild Neurocognitive Disorder.Compared to prior testing in March 2022, there was some cognitive decline observed. Etiology remains unclear, decline raises concern for early stages of a neurodegenerative illness. We discussed how a lumbar puncture and sending for CSF biomarkers for Alzheimer's disease can help with diagnosis if they wish. We discussed starting Donepezil 5mg  1/2 tablet daily for 2 weeks, then increase to 1 tablet daily. Side effects and expectations from medication were discussed.   2. Dizziness. Etiology unclear, MRI brain no acute changes. MRA head and neck showed focal stenosis in bilateral P2 segments proximally but flow was seen distally. There was no vertebrobasilar insufficiency seen. P2 stenosis would not typically cause dizziness. He reports hearing loss and tinnitus, recommended evaluation with the [facility], however he would like to contact his cardiologist Dr. Allena Katz first and see if he can help.   Follow-up in 4-5 months, call for any changes.     Thank you for allowing me to participate in his care.  Please do not hesitate to call for any questions or concerns.    Patrcia Dolly, M.D.   CC: Dr. Alphonsus Sias

## 2022-08-08 NOTE — Patient Instructions (Addendum)
Good to see you.  Start Donepezil 5mg : take 1/2 tablet daily for 2 weeks, then increase to 1 tablet daily  2. Please contact Dr[facility]ena Katz at [facility] and see if he can help. Otherwise we can send a referral to the [facility]  3. Think about doing the spinal tap to check for change in protein levels seen in patients with Alzheimer's disease  4. Consider getting a hearing aide  5. Follow-up in 4-5 months, call for any changes   FALL PRECAUTIONS: Be cautious when walking. Scan the area for obstacles that may increase the risk of trips and falls. When getting up in the mornings, sit up at the edge of the bed for a few minutes before getting out of bed. Consider elevating the bed at the head end to avoid drop of blood pressure when getting up. Walk always in a well-lit room (use night lights in the walls). Avoid area rugs or power cords from appliances in the middle of the walkways. Use a walker or a cane if necessary and consider physical therapy for balance exercise. Get your eyesight checked regularly.  RECOMMENDATIONS FOR ALL PATIENTS WITH MEMORY PROBLEMS: 1. Continue to exercise (Recommend 30 minutes of walking everyday, or 3 hours every week) 2. Increase social interactions - continue going to [place] and enjoy social gatherings with friends and family 3. Eat healthy, avoid fried foods and eat more fruits and vegetables 4. Maintain adequate blood pressure, blood sugar, and blood cholesterol level. Reducing the risk of stroke and cardiovascular disease also helps promoting better memory. 5. Avoid stressful situations. Live a simple life and avoid aggravations. Organize your time and prepare for the next day in anticipation. 6. Sleep well, avoid any interruptions of sleep and avoid any distractions in the bedroom that may interfere with adequate sleep quality 7. Avoid sugar, avoid sweets as there is a strong link between excessive sugar intake, diabetes, and cognitive impairment The  Mediterranean diet has been shown to help patients reduce the risk of progressive memory disorders and reduces cardiovascular risk. This includes eating fish, eat fruits and green leafy vegetables, nuts like almonds and hazelnuts, walnuts, and also use olive oil. Avoid fast foods and fried foods as much as possible. Avoid sweets and sugar as sugar use has been linked to worsening of memory function.  There is always a concern of gradual progression of memory problems. If this is the case, then we may need to adjust level of care according to patient needs. Support, both to the patient and caregiver, should then be put into place.

## 2022-08-10 ENCOUNTER — Other Ambulatory Visit: Payer: Self-pay | Admitting: Internal Medicine

## 2022-08-10 DIAGNOSIS — G4733 Obstructive sleep apnea (adult) (pediatric): Secondary | ICD-10-CM | POA: Diagnosis not present

## 2022-08-21 ENCOUNTER — Telehealth: Payer: Self-pay | Admitting: Nurse Practitioner

## 2022-08-21 NOTE — Telephone Encounter (Signed)
Patient called to ask advice from the doctor regarding his cough and his cpap machine.  Please call to discuss further.  CB# [phone]

## 2022-08-22 ENCOUNTER — Ambulatory Visit (INDEPENDENT_AMBULATORY_CARE_PROVIDER_SITE_OTHER)
Admission: RE | Admit: 2022-08-22 | Discharge: 2022-08-22 | Disposition: A | Discharge status: Home / Self Care | Payer: Medicare HMO | Source: Ambulatory Visit | Attending: Family Medicine | Admitting: Family Medicine

## 2022-08-22 ENCOUNTER — Encounter: Payer: Self-pay | Admitting: Family Medicine

## 2022-08-22 ENCOUNTER — Ambulatory Visit (INDEPENDENT_AMBULATORY_CARE_PROVIDER_SITE_OTHER): Payer: Medicare HMO | Admitting: Family Medicine

## 2022-08-22 VITALS — BP 116/64 | HR 97 | Temp 96.7°F | Ht 77.0 in | Wt 232.0 lb

## 2022-08-22 DIAGNOSIS — J209 Acute bronchitis, unspecified: Secondary | ICD-10-CM | POA: Diagnosis not present

## 2022-08-22 DIAGNOSIS — R634 Abnormal weight loss: Secondary | ICD-10-CM | POA: Insufficient documentation

## 2022-08-22 DIAGNOSIS — R059 Cough, unspecified: Secondary | ICD-10-CM | POA: Diagnosis not present

## 2022-08-22 MED ORDER — BENZONATATE 200 MG PO CAPS: 200.0000 mg | ORAL_CAPSULE | Freq: Three times a day (TID) | ORAL | 1 refills | Status: DC | PRN

## 2022-08-22 MED ORDER — PREDNISONE 20 MG PO TABS: 20.0000 mg | ORAL_TABLET | Freq: Every day | ORAL | 0 refills | Status: DC

## 2022-08-22 NOTE — Progress Notes (Signed)
Subjective:    Patient ID: Alejandro Koch, male    DOB: Feb 15, 1951, 72 y.o.   MRN: [id number]  HPI 72 yo pt of Dr Demaris Callander presents with cough  Pt mentions weight loss   Wt Readings from Last 3 Encounters:  08/22/22 232 lb (105.2 kg)  08/08/22 240 lb 3.2 oz (109 kg)  07/10/22 240 lb 12.8 oz (109.2 kg)   27.51 kg/m   Is losing weight  Less appetite and more active - but still puzzled by it     Vitals:   08/22/22 1103  BP: 116/64  Pulse: 97  Temp: (!) 96.7 F (35.9 C)  SpO2: 98%    Not productive. Not able to lay flat. Has not been able to use c pap last few days due to cough. 4-5 days has some tessalon that he took last night that was helpful but will need refill.    Started about 3-4 day ago  Non prod  Spells and cannot stop coughing  Wakes him up  Comes from low in chest   Did not do covid test Did not feel sick No sick contacts   No headache or fever    Took some tessalon-helped times one   No sinus issues  A little hoarse No pnd  No ST   Smoked around 20 y  Quit in 1980s   Does not feel like allergies are out of control   Over the counter  Nothing   Has GERD-well controlled with nexium  Cxr DG Chest 2 View  Result Date: 08/22/2022 CLINICAL DATA:  Recent unexpected weight loss.  Cough for 4 days. EXAM: CHEST - 2 VIEW COMPARISON:  Chest radiographs 02/20/2011 and chest and right rib radiographs 07/31/2010 FINDINGS: Cardiac silhouette and mediastinal contours are within normal limits. There is minimal tenting of the superior, slightly lateral aspect of the right hemidiaphragm that is very similar to 07/31/2010 remote radiographs. This may be due to minimal atelectasis. The left lung is clear. No pleural effusion pneumothorax. Mild dextrocurvature of the thoracolumbar junction. Mild-to-moderate multilevel degenerative disc changes. IMPRESSION: Minimal tenting of the superior, slightly lateral aspect of the right hemidiaphragm is very similar to  07/31/2010 remote radiographs. This may be due to minimal atelectasis. Electronically Signed   By: Neita Garnet M.D.   On: 08/22/2022 11:59     Patient Active Problem List   Diagnosis Date Noted   Acute bronchitis 08/22/2022   Weight loss 08/22/2022   Glaucoma 07/10/2022   Disequilibrium 06/20/2022   Sensorineural hearing loss (SNHL) of both ears 06/20/2022   Tinnitus of both ears 06/20/2022   Skin lesion 11/06/2021   Mild neurocognitive disorder 05/18/2020   CHF (congestive heart failure) (HCC)    Major depressive disorder    BPH with obstruction/lower urinary tract symptoms 12/27/2018   Vitamin B12 deficiency 06/16/2017   Laryngopharyngeal reflux (LPR) 06/03/2016   Anemia 07/23/2015   Fatty liver 03/01/2010   Hypogonadism in male 09/19/2009   Type 2 DM with diabetic neuropathy affecting both sides of body (HCC) 03/05/2009   Hyperlipemia 09/30/2006   Benign essential hypertension 09/30/2006   Allergic rhinitis 09/30/2006   GERD (gastroesophageal reflux disease) 09/30/2006   OSA (obstructive sleep apnea) 09/30/2006   Past Medical History:  Diagnosis Date   Abdominal discomfort, epigastric 06/10/2017   Actinic keratoses 05/12/2014   Acute chest pain 12/20/2007   Acute prostatitis 06/10/2017   Classic presentation; will treat with sulfa x 2 weeks. He did well with this back in  March   Allergic rhinitis 09/30/2006   Anemia    Benign essential hypertension 09/30/2006   BP Readings from Last 3 Encounters: 06/16/17 138/72  06/09/17 118/66  12/09/16 136/82   Reasonable control   Blood transfusion without reported diagnosis    40 yrs ago    BPH with obstruction/lower urinary tract symptoms 12/27/2018   Still with mild symptoms. If worsens, will need to go back to the urologist   CHF (congestive heart failure) (HCC)    Chronic cough 07/16/2018   Fits definition of chronic bronchitis except not 2 years in a row. Seeing the pulmonary doctor. Doesn't feel the breo has helped    Disequilibrium 06/20/2022   Extensor tendon laceration of right hand with open wound 09/16/2016   Fatigue 06/10/2017   Fatty liver 03/01/2010   LFTs normal. Still needs more weight loss   GERD (gastroesophageal reflux disease) 09/30/2006   Tried omeprazole and symptoms all came back. Will order the nexium again   Glaucoma    Hyperlipemia 09/30/2006   Inconsistent with the atorvastatin. Discussed---will take it twice a week   Hyperlipidemia    Hypogonadism in male 09/19/2009   Kidney stones    Laryngopharyngeal reflux (LPR) 06/03/2016   Left foot pain 09/20/2019   Major depressive disorder    Mild neurocognitive disorder 05/18/2020   OSA (obstructive sleep apnea) 09/30/2006   NPSG 07/2014:  AHI 23/hr, PLMS 452 with 12/hr with A/A NPSG 07/2014:  AHI 23/hr, PLMS 452 with 12/hr with A/A. The patient has moderate obstructive sleep apnea by his recent study, and is very symptomatic during the day. I have discussed with him the various treatment options, and have recommended a trial of C P   Sensorineural hearing loss (SNHL) of both ears 06/20/2022   Shortness of breath 05/21/2015   09/11/17 Right and Left Heart cath: Right radial and brachial access.Left dominant coronary system with no CAD RHC with some exercise showed RA 15, 14 mm Hg (mean 12)PA 37/14  mean 27,PWP 17,15 mean 16   Skin lesion 11/06/2021   Tinnitus of both ears 06/20/2022   Type 2 DM with diabetic neuropathy affecting both sides of body (HCC) 03/05/2009   HGBA1C 8.7 (A) 03/30/2018. Not much better. I want to send him to endocrine--he wants to hold off (doesn't want insulin, etc) Needs formal help---like Weight [employer] Will give him 3 months more   Vitamin B12 deficiency 06/16/2017   Does take supplement daily. Will check with full labs at next visit   Past Surgical History:  Procedure Laterality Date   CARDIAC CATHETERIZATION     [facility] - myocard perf and echo done as well - all normal- EF 48-55% 2019 April and june 2019    COLONOSCOPY  2008   FINGER SURGERY  2013   infection 2nd finger left hand   POLYPECTOMY     REPAIR FLEXOR TENDON HAND Right 08/2016   Social History   Tobacco Use   Smoking status: Former    Packs/day: 2.50    Years: 7.00    Additional pack years: 0.00    Total pack years: 17.50    Types: Cigarettes    Quit date: 03/18/1983    Years since quitting: 39.4    Passive exposure: Past   Smokeless tobacco: Never  Vaping Use   Vaping Use: Never used  Substance Use Topics   Alcohol use: Not Currently    Comment: occasional glass of wine   Drug use: No   Family History  Problem Relation Age of Onset   Alzheimer's disease Mother    Heart disease Father    COPD Father    Colon cancer Father 60   Prostate cancer Father    Dementia Father    Colon polyps Brother    Alzheimer's disease Maternal Grandmother    Leukemia Brother    Leukemia Other    Stomach cancer Neg Hx    Esophageal cancer Neg Hx    Rectal cancer Neg Hx    Allergies  Allergen Reactions   Iohexol      Code: HIVES, Desc: PT IS ALLERGIC TO IVP DYE AS NOTED IN PREVIOUS RECORDS IN PACS 02/2009/RM, Onset Date: 16109604    Atorvastatin Other (See Comments)    Myalgias with this and rosuvastin   Current Outpatient Medications on File Prior to Visit  Medication Sig Dispense Refill   ALPRAZolam (XANAX) 0.5 MG tablet TAKE 1 TABLET BY MOUTH THREE TIMES A DAY AS NEEDED 90 tablet 0   aspirin 81 MG chewable tablet Chew by mouth.     Blood Glucose Calibration (ONETOUCH VERIO) SOLN AS DIRECTED 1 each 1   Blood Glucose Monitoring Suppl (ONETOUCH VERIO FLEX SYSTEM) w/Device KIT Use as directed,once daily to test blood sugar 1 kit 0   BYDUREON BCISE 2 MG/0.85ML AUIJ INJECT 2 MG INTO THE SKIN ONCE A WEEK. 10.2 mL 11   cyclobenzaprine (FLEXERIL) 10 MG tablet Take by mouth.     dapagliflozin propanediol (FARXIGA) 10 MG TABS tablet Take 1 tablet (10 mg total) by mouth daily before breakfast. 90 tablet 3   donepezil (ARICEPT) 5 MG  tablet Take 1/2 tablet daily for 2 weeks, then increase to 1 tablet daily and continue 30 tablet 6   esomeprazole (NEXIUM) 20 MG capsule Take 40 mg by mouth 2 (two) times daily before a meal.     finasteride (PROSCAR) 5 MG tablet TAKE 1 TABLET BY MOUTH EVERY DAY (Patient taking differently: Take 5 mg by mouth daily.) 90 tablet 3   glipiZIDE (GLUCOTROL XL) 10 MG 24 hr tablet TAKE 1 TABLET (10 MG TOTAL) BY MOUTH DAILY WITH BREAKFAST. 90 tablet 3   glucose blood (ONETOUCH VERIO) test strip CHECK BLOOD SUGAR EVERY DAY. E11.42 50 strip 11   hydrocortisone 2.5 % cream APPLY TOPICALLY 3 TIMES DAILY AS NEEDED. 28 g 3   ketorolac (ACULAR) 0.5 % ophthalmic solution USE AS DIRECTED 5 mL 1   metFORMIN (GLUCOPHAGE-XR) 500 MG 24 hr tablet TAKE 2 TABLETS (1,000 MG TOTAL) BY MOUTH IN THE MORNING AND AT BEDTIME. 360 tablet 3   Meth-Hyo-M Bl-Na Phos-Ph Sal (URO-MP) 118 MG CAPS Take 1 capsule by mouth every 6 (six) hours as needed.     nitroGLYCERIN (NITROSTAT) 0.4 MG SL tablet Place under the tongue.     ONETOUCH DELICA LANCETS FINE MISC 1 Units by Does not apply route daily. 100 each 2   rosuvastatin (CRESTOR) 10 MG tablet TAKE 1 TABLET BY MOUTH EVERY DAY 90 tablet 3   tamsulosin (FLOMAX) 0.4 MG CAPS capsule TAKE 1 CAPSULE BY MOUTH EVERY DAY AFTER SUPPER 90 capsule 3   urea (CARMOL) 40 % CREA APPLY PEA SIZED AMOUNT TO HARD AREA BELOW PINKY TOE TWICE A DAY 28.35 g 1   vitamin B-12 (CYANOCOBALAMIN) 1000 MCG tablet Take 1 tablet (1,000 mcg total) by mouth daily.     No current facility-administered medications on file prior to visit.    Review of Systems  Constitutional:  Positive for appetite change and unexpected  weight change. Negative for activity change, fatigue and fever.  HENT:  Negative for congestion, rhinorrhea, sore throat and trouble swallowing.   Eyes:  Negative for pain, redness, itching and visual disturbance.  Respiratory:  Positive for cough and chest tightness. Negative for apnea, choking,  shortness of breath, wheezing and stridor.   Cardiovascular:  Negative for chest pain and palpitations.  Gastrointestinal:  Negative for abdominal pain, blood in stool, constipation, diarrhea and nausea.  Endocrine: Negative for cold intolerance, heat intolerance, polydipsia and polyuria.  Genitourinary:  Negative for difficulty urinating, dysuria, frequency and urgency.  Musculoskeletal:  Negative for arthralgias, joint swelling and myalgias.  Skin:  Negative for pallor and rash.  Neurological:  Negative for dizziness, tremors, weakness, numbness and headaches.  Hematological:  Negative for adenopathy. Does not bruise/bleed easily.  Psychiatric/Behavioral:  Negative for decreased concentration and dysphoric mood. The patient is not nervous/anxious.        Objective:   Physical Exam Constitutional:      General: He is not in acute distress.    Appearance: Normal appearance. He is well-developed and normal weight. He is not ill-appearing or diaphoretic.  HENT:     Head: Normocephalic and atraumatic.     Right Ear: Tympanic membrane and ear canal normal.     Left Ear: Tympanic membrane and ear canal normal.     Nose:     Comments: Boggy nares     Mouth/Throat:     Mouth: Mucous membranes are moist.     Pharynx: Oropharynx is clear.     Comments: Voice is hoarse Eyes:     General:        Right eye: No discharge.        Left eye: No discharge.     Conjunctiva/sclera: Conjunctivae normal.     Pupils: Pupils are equal, round, and reactive to light.  Neck:     Thyroid: No thyromegaly.     Vascular: No carotid bruit or JVD.  Cardiovascular:     Rate and Rhythm: Regular rhythm. Tachycardia present.     Heart sounds: Normal heart sounds.     No gallop.  Pulmonary:     Effort: Pulmonary effort is normal. No respiratory distress.     Breath sounds: No stridor. Rhonchi present. No wheezing or rales.     Comments: Few scattered rhonchi  Cough sounds harsh and dry  No rales or  crackles Chest:     Chest wall: No tenderness.  Abdominal:     General: There is no distension or abdominal bruit.     Palpations: Abdomen is soft.  Musculoskeletal:     Cervical back: Normal range of motion and neck supple. No tenderness.     Right lower leg: No edema.     Left lower leg: No edema.  Lymphadenopathy:     Cervical: No cervical adenopathy.  Skin:    General: Skin is warm and dry.     Coloration: Skin is not pale.     Findings: No rash.  Neurological:     Mental Status: He is alert.     Coordination: Coordination normal.     Deep Tendon Reflexes: Reflexes are normal and symmetric. Reflexes normal.  Psychiatric:        Mood and Affect: Mood normal.           Assessment & Plan:   Problem List Items Addressed This Visit       Respiratory   Acute bronchitis - Primary  4 d of non productive cough/ worse at night  Some rhonchi on exam No other symptoms Pt states he does not have allergy/nasal symptoms and GERD is well controlled Quit smoking in 1980s (smoked about 20 years) and no h/o copd   Cxr ordered (for this and recent weight loss)-no infilrate Px prednisone 20 mg daily for 5 d (aware this will increase blood sugar and may cause trouble sleeping) Refilled tessalon which works well  Discussed symptom care/see AVS Also er precautions       Relevant Orders   DG Chest 2 View (Completed)     Other   Weight loss    Lost 8 lb since late may  Per pt some appetite decrease and is more active but still a bit puzzled/ not trying hard  Has a cough/ former smoker Cxr today -no infiltrate or acute changes/ some stable tenting of R hemi diaphragm   Will schedule f/u with Dr Alphonsus Sias to discuss further

## 2022-08-22 NOTE — Assessment & Plan Note (Addendum)
4 d of non productive cough/ worse at night  Some rhonchi on exam No other symptoms Pt states he does not have allergy/nasal symptoms and GERD is well controlled Quit smoking in 1980s (smoked about 20 years) and no h/o copd   Cxr ordered (for this and recent weight loss)-no infilrate Px prednisone 20 mg daily for 5 d (aware this will increase blood sugar and may cause trouble sleeping) Refilled tessalon which works well  Discussed symptom care/see AVS Also er precautions

## 2022-08-22 NOTE — Assessment & Plan Note (Addendum)
Lost 8 lb since late may  Per pt some appetite decrease and is more active but still a bit puzzled/ not trying hard  Has a cough/ former smoker Cxr today -no infiltrate or acute changes/ some stable tenting of R hemi diaphragm   Will schedule f/u with Dr Alphonsus Sias to discuss further

## 2022-08-22 NOTE — Patient Instructions (Addendum)
Drink lots of fluids Use generic tessalon for cough as needed   Take prednisone 20 mg once daily for 5 days It will raise blood sugar It may make you feel hyper and hungry   Chest xray today  We will call you with a result    Update if not starting to improve in a week or if worsening    If severe or any trouble breathing go to the ER    Follow up with Dr Alphonsus Sias to discuss weight loss and memory

## 2022-08-26 NOTE — Telephone Encounter (Signed)
Called and spoke with pt and his wife. The wife stated the pt had a cough and it was hard to wear his cpap while coughing at night. Pt just wanted to inform us about this as a FYI. Nothing further needed

## 2022-09-10 DIAGNOSIS — G4733 Obstructive sleep apnea (adult) (pediatric): Secondary | ICD-10-CM | POA: Diagnosis not present

## 2022-09-22 ENCOUNTER — Encounter: Payer: Self-pay | Admitting: Internal Medicine

## 2022-09-22 ENCOUNTER — Ambulatory Visit (INDEPENDENT_AMBULATORY_CARE_PROVIDER_SITE_OTHER): Payer: Medicare HMO | Admitting: Internal Medicine

## 2022-09-22 VITALS — BP 110/76 | HR 76 | Temp 97.2°F | Ht 77.0 in | Wt 238.0 lb

## 2022-09-22 DIAGNOSIS — E1142 Type 2 diabetes mellitus with diabetic polyneuropathy: Secondary | ICD-10-CM

## 2022-09-22 DIAGNOSIS — L570 Actinic keratosis: Secondary | ICD-10-CM

## 2022-09-22 DIAGNOSIS — Z7984 Long term (current) use of oral hypoglycemic drugs: Secondary | ICD-10-CM

## 2022-09-22 LAB — POCT GLYCOSYLATED HEMOGLOBIN (HGB A1C): Hemoglobin A1C: 7.2 % — AB (ref 4.0–5.6)

## 2022-09-22 MED ORDER — UREA 40 % EX CREA: 1.0000 "application " | TOPICAL_CREAM | Freq: Three times a day (TID) | CUTANEOUS | 1 refills | Status: DC

## 2022-09-22 NOTE — Progress Notes (Signed)
Subjective:    Patient ID: Alejandro Koch, male    DOB: [birth year], 72 y.o.   MRN: [id number]  HPI Here with wife for follow up of poorly controlled diabetes  Has cut back on what he is eating Same foods but has cut back on the quantity Walking more--walks dog  Checks sugars every morning---- 80-130 Seems to be lower with split dose of metformin  Has scaly spot in front of left ear---itchy Stable spot on right side---for a couple of years  Current Outpatient Medications on File Prior to Visit  Medication Sig Dispense Refill   ALPRAZolam (XANAX) 0.5 MG tablet TAKE 1 TABLET BY MOUTH THREE TIMES A DAY AS NEEDED 90 tablet 0   aspirin 81 MG chewable tablet Chew by mouth.     benzonatate (TESSALON) 200 MG capsule Take 1 capsule (200 mg total) by mouth 3 (three) times daily as needed for cough. 60 capsule 1   Blood Glucose Calibration (ONETOUCH VERIO) SOLN AS DIRECTED 1 each 1   Blood Glucose Monitoring Suppl (ONETOUCH VERIO FLEX SYSTEM) w/Device KIT Use as directed,once daily to test blood sugar 1 kit 0   BYDUREON BCISE 2 MG/0.85ML AUIJ INJECT 2 MG INTO THE SKIN ONCE A WEEK. 10.2 mL 11   cyclobenzaprine (FLEXERIL) 10 MG tablet Take by mouth.     dapagliflozin propanediol (FARXIGA) 10 MG TABS tablet Take 1 tablet (10 mg total) by mouth daily before breakfast. 90 tablet 3   donepezil (ARICEPT) 5 MG tablet Take 1/2 tablet daily for 2 weeks, then increase to 1 tablet daily and continue 30 tablet 6   esomeprazole (NEXIUM) 20 MG capsule Take 40 mg by mouth 2 (two) times daily before a meal.     finasteride (PROSCAR) 5 MG tablet TAKE 1 TABLET BY MOUTH EVERY DAY (Patient taking differently: Take 5 mg by mouth daily.) 90 tablet 3   glipiZIDE (GLUCOTROL XL) 10 MG 24 hr tablet TAKE 1 TABLET (10 MG TOTAL) BY MOUTH DAILY WITH BREAKFAST. 90 tablet 3   glucose blood (ONETOUCH VERIO) test strip CHECK BLOOD SUGAR EVERY DAY. E11.42 50 strip 11   hydrocortisone 2.5 % cream APPLY TOPICALLY 3 TIMES DAILY AS  NEEDED. 28 g 3   ketorolac (ACULAR) 0.5 % ophthalmic solution USE AS DIRECTED 5 mL 1   metFORMIN (GLUCOPHAGE-XR) 500 MG 24 hr tablet TAKE 2 TABLETS (1,000 MG TOTAL) BY MOUTH IN THE MORNING AND AT BEDTIME. 360 tablet 3   Meth-Hyo-M Bl-Na Phos-Ph Sal (URO-MP) 118 MG CAPS Take 1 capsule by mouth every 6 (six) hours as needed.     ONETOUCH DELICA LANCETS FINE MISC 1 Units by Does not apply route daily. 100 each 2   rosuvastatin (CRESTOR) 10 MG tablet TAKE 1 TABLET BY MOUTH EVERY DAY 90 tablet 3   tamsulosin (FLOMAX) 0.4 MG CAPS capsule TAKE 1 CAPSULE BY MOUTH EVERY DAY AFTER SUPPER 90 capsule 3   urea (CARMOL) 40 % CREA APPLY PEA SIZED AMOUNT TO HARD AREA BELOW PINKY TOE TWICE A DAY 28.35 g 1   vitamin B-12 (CYANOCOBALAMIN) 1000 MCG tablet Take 1 tablet (1,000 mcg total) by mouth daily.     nitroGLYCERIN (NITROSTAT) 0.4 MG SL tablet Place under the tongue.     No current facility-administered medications on file prior to visit.    Allergies  Allergen Reactions   Iohexol      Code: HIVES, Desc: PT IS ALLERGIC TO IVP DYE AS NOTED IN PREVIOUS RECORDS IN PACS 02/2009/RM, Onset Date:  16109604    Atorvastatin Other (See Comments)    Myalgias with this and rosuvastin    Past Medical History:  Diagnosis Date   Abdominal discomfort, epigastric 06/10/2017   Actinic keratoses 05/12/2014   Acute chest pain 12/20/2007   Acute prostatitis 06/10/2017   Classic presentation; will treat with sulfa x 2 weeks. He did well with this back in March   Allergic rhinitis 09/30/2006   Anemia    Benign essential hypertension 09/30/2006   BP Readings from Last 3 Encounters: 06/16/17 138/72  06/09/17 118/66  12/09/16 136/82   Reasonable control   Blood transfusion without reported diagnosis    40 yrs ago    BPH with obstruction/lower urinary tract symptoms 12/27/2018   Still with mild symptoms. If worsens, will need to go back to the urologist   CHF (congestive heart failure) (HCC)    Chronic cough 07/16/2018    Fits definition of chronic bronchitis except not 2 years in a row. Seeing the pulmonary doctor. Doesn't feel the breo has helped   Disequilibrium 06/20/2022   Extensor tendon laceration of right hand with open wound 09/16/2016   Fatigue 06/10/2017   Fatty liver 03/01/2010   LFTs normal. Still needs more weight loss   GERD (gastroesophageal reflux disease) 09/30/2006   Tried omeprazole and symptoms all came back. Will order the nexium again   Glaucoma    Hyperlipemia 09/30/2006   Inconsistent with the atorvastatin. Discussed---will take it twice a week   Hyperlipidemia    Hypogonadism in male 09/19/2009   Kidney stones    Laryngopharyngeal reflux (LPR) 06/03/2016   Left foot pain 09/20/2019   Major depressive disorder    Mild neurocognitive disorder 05/18/2020   OSA (obstructive sleep apnea) 09/30/2006   NPSG 07/2014:  AHI 23/hr, PLMS 452 with 12/hr with A/A NPSG 07/2014:  AHI 23/hr, PLMS 452 with 12/hr with A/A. The patient has moderate obstructive sleep apnea by his recent study, and is very symptomatic during the day. I have discussed with him the various treatment options, and have recommended a trial of C P   Sensorineural hearing loss (SNHL) of both ears 06/20/2022   Shortness of breath 05/21/2015   09/11/17 Right and Left Heart cath: Right radial and brachial access.Left dominant coronary system with no CAD RHC with some exercise showed RA 15, 14 mm Hg (mean 12)PA 37/14  mean 27,PWP 17,15 mean 16   Skin lesion 11/06/2021   Tinnitus of both ears 06/20/2022   Type 2 DM with diabetic neuropathy affecting both sides of body (HCC) 03/05/2009   HGBA1C 8.7 (A) 03/30/2018. Not much better. I want to send him to endocrine--he wants to hold off (doesn't want insulin, etc) Needs formal help---like Weight Watchers Will give him 3 months more   Vitamin B12 deficiency 06/16/2017   Does take supplement daily. Will check with full labs at next visit    Past Surgical History:  Procedure  Laterality Date   CARDIAC CATHETERIZATION     [facility] - myocard perf and echo done as well - all normal- EF 48-55% 2019 April and june 2019   COLONOSCOPY  2008   FINGER SURGERY  2013   infection 2nd finger left hand   POLYPECTOMY     REPAIR FLEXOR TENDON HAND Right 08/2016    Family History  Problem Relation Age of Onset   Alzheimer's disease Mother    Heart disease Father    COPD Father    Colon cancer Father 82   Prostate  cancer Father    Dementia Father    Colon polyps Brother    Alzheimer's disease Maternal Grandmother    Leukemia Brother    Leukemia Other    Stomach cancer Neg Hx    Esophageal cancer Neg Hx    Rectal cancer Neg Hx     Social History   Socioeconomic History   Marital status: Married    Spouse name: Not on file   Number of children: 1   Years of education: 14   Highest education level: Associate degree: academic program  Occupational History   Occupation: Retired    Comment: EMS, Network engineer  Tobacco Use   Smoking status: Former    Packs/day: 2.50    Years: 7.00    Additional pack years: 0.00    Total pack years: 17.50    Types: Cigarettes    Quit date: 03/18/1983    Years since quitting: 39.5    Passive exposure: Past   Smokeless tobacco: Never  Vaping Use   Vaping Use: Never used  Substance and Sexual Activity   Alcohol use: Not Currently    Comment: occasional glass of wine   Drug use: No   Sexual activity: Not on file  Other Topics Concern   Not on file  Social History Narrative   No living will or health care POA thus far   Wife, then son, should make decisions. May make son the primary person   Would accept resuscitation attempts but wouldn't want to be on machines   No tube feeds if cognitively unaware   Right handed   Social Determinants of Health   Financial Resource Strain: Not on file  Food Insecurity: Not on file  Transportation Needs: Not on file  Physical Activity: Not on file  Stress: Not on file  Social  Connections: Not on file  Intimate Partner Violence: Not on file   Review of Systems Weight down a little Not sleeping great Memory is stable     Objective:   Physical Exam Constitutional:      Appearance: Normal appearance.  Cardiovascular:     Rate and Rhythm: Normal rate and regular rhythm.     Pulses: Normal pulses.     Heart sounds: No murmur heard.    No gallop.  Pulmonary:     Effort: Pulmonary effort is normal.     Breath sounds: Normal breath sounds. No wheezing or rales.  Musculoskeletal:     Cervical back: Neck supple.  Lymphadenopathy:     Cervical: No cervical adenopathy.  Skin:    Comments: 5mm actinic in left preauricular area Scattered actinic damage surrounding this  Neurological:     Mental Status: He is alert.  Psychiatric:        Mood and Affect: Mood normal.        Behavior: Behavior normal.            Assessment & Plan:

## 2022-09-22 NOTE — Assessment & Plan Note (Signed)
Lab Results  Component Value Date   HGBA1C 7.2 (A) 09/22/2022   Much better control with decrease appetite and eating less Continue bydureon 2mg  weekly, metformin 1000mg  bid, glipizide 10mg  daily, farxiga 10 daily

## 2022-09-22 NOTE — Assessment & Plan Note (Signed)
Discussed Rx--he gave verbal consent Liquid nitrogen 20 seconds x 2 and shorter time diffusely to actinic damage Tolerated well Discussed home care Needs to wear hat and establish with dermatologist

## 2022-09-30 DIAGNOSIS — R4189 Other symptoms and signs involving cognitive functions and awareness: Secondary | ICD-10-CM | POA: Diagnosis not present

## 2022-10-02 DIAGNOSIS — Z01818 Encounter for other preprocedural examination: Secondary | ICD-10-CM | POA: Diagnosis not present

## 2022-10-02 DIAGNOSIS — H25812 Combined forms of age-related cataract, left eye: Secondary | ICD-10-CM | POA: Diagnosis not present

## 2022-10-02 DIAGNOSIS — H40052 Ocular hypertension, left eye: Secondary | ICD-10-CM | POA: Diagnosis not present

## 2022-10-10 DIAGNOSIS — G4733 Obstructive sleep apnea (adult) (pediatric): Secondary | ICD-10-CM | POA: Diagnosis not present

## 2022-10-24 DIAGNOSIS — H40052 Ocular hypertension, left eye: Secondary | ICD-10-CM | POA: Diagnosis not present

## 2022-10-24 DIAGNOSIS — H25812 Combined forms of age-related cataract, left eye: Secondary | ICD-10-CM | POA: Diagnosis not present

## 2022-10-27 DIAGNOSIS — G4733 Obstructive sleep apnea (adult) (pediatric): Secondary | ICD-10-CM | POA: Diagnosis not present

## 2022-11-02 ENCOUNTER — Other Ambulatory Visit: Payer: Self-pay | Admitting: Internal Medicine

## 2022-11-10 DIAGNOSIS — G4733 Obstructive sleep apnea (adult) (pediatric): Secondary | ICD-10-CM | POA: Diagnosis not present

## 2022-11-21 DIAGNOSIS — H401111 Primary open-angle glaucoma, right eye, mild stage: Secondary | ICD-10-CM | POA: Diagnosis not present

## 2022-11-21 DIAGNOSIS — H25811 Combined forms of age-related cataract, right eye: Secondary | ICD-10-CM | POA: Diagnosis not present

## 2022-12-08 ENCOUNTER — Encounter: Payer: Self-pay | Admitting: Neurology

## 2022-12-08 ENCOUNTER — Ambulatory Visit: Payer: Medicare HMO | Admitting: Neurology

## 2022-12-08 VITALS — BP 133/79 | HR 81 | Resp 18 | Ht 77.0 in | Wt 234.0 lb

## 2022-12-08 DIAGNOSIS — G3184 Mild cognitive impairment, so stated: Secondary | ICD-10-CM

## 2022-12-08 NOTE — Patient Instructions (Signed)
Good to see you.  The medication that can help slow down memory loss is called Donepezil (Aricept). It is not addictive and stopping medication does not cause problems in itself. If someone has an underlying condition such as dementia, it is the dementia that worsens and not stopping the medication.  2. A spinal tap would be helpful to provide additional information about the cause of memory changes. If patients have a side effect from the procedure, it is typically a headache that improves with lying down flat for several hours.  3. Mood changes are common with memory changes. We can start medication to help smoothen mood (ie irritability, frustration)  4. If you would like to have an evaluation at [facility] or [facility] clinic for the dizziness, let me know  5. Follow-up in 5 months, call for any changes    RECOMMENDATIONS FOR ALL PATIENTS WITH MEMORY PROBLEMS: 1. Continue to exercise (Recommend 30 minutes of walking everyday, or 3 hours every week) 2. Increase social interactions - continue going to [place] and enjoy social gatherings with friends and family 3. Eat healthy, avoid fried foods and eat more fruits and vegetables 4. Maintain adequate blood pressure, blood sugar, and blood cholesterol level. Reducing the risk of stroke and cardiovascular disease also helps promoting better memory. 5. Avoid stressful situations. Live a simple life and avoid aggravations. Organize your time and prepare for the next day in anticipation. 6. Sleep well, avoid any interruptions of sleep and avoid any distractions in the bedroom that may interfere with adequate sleep quality 7. Avoid sugar, avoid sweets as there is a strong link between excessive sugar intake, diabetes, and cognitive impairment The Mediterranean diet has been shown to help patients reduce the risk of progressive memory disorders and reduces cardiovascular risk. This includes eating fish, eat fruits and green leafy  vegetables, nuts like almonds and hazelnuts, walnuts, and also use olive oil. Avoid fast foods and fried foods as much as possible. Avoid sweets and sugar as sugar use has been linked to worsening of memory function.  There is always a concern of gradual progression of memory problems. If this is the case, then we may need to adjust level of care according to patient needs. Support, both to the patient and caregiver, should then be put into place.

## 2022-12-08 NOTE — Progress Notes (Signed)
NEUROLOGY FOLLOW UP OFFICE NOTE  Alejandro Koch [id number] 07-03-1950  HISTORY OF PRESENT ILLNESS: I had the pleasure of seeing Alejandro Koch in follow-up in the neurology clinic on 12/08/2022.  The patient was last seen 4 months ago for memory loss and dizziness. He is again accompanied by his wife [name] who helps supplement the history today.  Records and images were personally reviewed where available.  Since his last visit, he had seen cardiologist Dr. Allena Katz at [facility] and discussed the dizziness and cognitive changes. Since his last visit, his wife notes memory has changed. He asks the same questions. He is fine in the mornings, but working on numbers after 2pm is difficult to continue, he gets irritable and very frustrated. He continues to drive and denies getting lost. His wife manages medications. He manages some bills, no issues. He is independent with dressing and bathing. He continues to note dizziness, no clear spinning component, he has difficulty making quick changes/shifts, feeling more wobbly/off balance. He fell against a door recently and had an abrasion on his left arm. They note easy bruising.    History on Initial Assessment 05/15/2020: This is a 72 year old right-handed man with a history of hypertension, hyperlipidemia, OSA, diabetes, anxiety, depression, presenting for evaluation of memory loss. He feels hie memory is not like it's supposed to be, he used to be pretty sharp and great with numbers. He lives with his wife. His wife started noticing changes about a year ago, he started having mood changes, a lot of edgy when he is usually even-keel. He was losing things frequently and appetite was not like it was. He was losing weight and reporting dizziness. He would repeat himself and get frustrated easily. She noticed his attention span would not be longer than 10-15 minutes, he would get easily distracted. 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 He has occasional word-finding difficulties when he used to be pretty accurate. He denies getting lost driving but has trouble remembering directions to the beach and questions himself more now. He denies missing medications or bill payments. His mother had Alzheimer's disease, his father had memory issues. He rarely drinks alcohol. He had a fall last Christmas where he lost consciousness. No neurosurgical procedures. He thinks his mood is better, he was in a very stressful work environment but did notice he used to be more tolerant but is now more irritable. His wife denies any paranoia or hallucinations.  He states he never had headaches in the past, but over the last 3 months he has had a low grade frontal headache that would occur infrequently. No associated nausea/vomiting. He has dizziness when sitting but more when standing. He describes a spinning sensation where he has to hold on for a few seconds to get his bearings, no falls. He denies any diplopia, dysarthria/dysphagia, neck/back pain, focal numbness/tingling/weakness, bowel/bladder dysfunction, anosmia, or tremors. Sleep is good with Xanax every night.   Diagnostic Data: MRI brain without contrast done 02/2020 did not show any acute changes, there was mild diffuse atrophy.  Repeat brain MRI without contrast done 02/2022 no acute changes, there was mild diffuse atrophy, minimal chronic microvascular disease, right maxillary sinusitis.   MRA head and neck on 03/24/22 showed focal severe proximal left P2 stenosis, otherwise patent distally, short-segment mild moderate distal right P2 stenosis, patent to distal aspects. There was a dominant left vertebral artery widely patent to the vertebrobasilar junction, hypoplastic right vertebral artery, both  PICA patent. Basilar patent to distal aspect without stenosis.   Neuropsychological testing in 05/2020: diagnosis of Mild Neurocognitive Disorder, etiology unclear. Findings were not  consistent with Alzheimer's disease, most likely culprit felt to be a combination of sleep dysfunction, Xanax side effects, and cardiovascular disease, various psychosocial stressors.  Repeat Neuropsychological testing on 06/2022: diagnosis of Mild Neurocognitive Disorder. Primary impairments were with executive functioning and both encoding and delayed retrieval aspects of memory. Compared to prior testing in March 2022, there was some cognitive decline observed. Etiology remains unclear, decline raises concern for early stages of a neurodegenerative illness.  Laboratory Data: Lab Results  Component Value Date   TSH 2.24 06/09/2017   Lab Results  Component Value Date   VITAMINB12 552 08/29/2020    PAST MEDICAL HISTORY: Past Medical History:  Diagnosis Date   Abdominal discomfort, epigastric 06/10/2017   Actinic keratoses 05/12/2014   Acute chest pain 12/20/2007   Acute prostatitis 06/10/2017   Classic presentation; will treat with sulfa x 2 weeks. He did well with this back in March   Allergic rhinitis 09/30/2006   Anemia    Benign essential hypertension 09/30/2006   BP Readings from Last 3 Encounters: 06/16/17 138/72  06/09/17 118/66  12/09/16 136/82   Reasonable control   Blood transfusion without reported diagnosis    40 yrs ago    BPH with obstruction/lower urinary tract symptoms 12/27/2018   Still with mild symptoms. If worsens, will need to go back to the urologist   CHF (congestive heart failure) (HCC)    Chronic cough 07/16/2018   Fits definition of chronic bronchitis except not 2 years in a row. Seeing the pulmonary doctor. Doesn't feel the breo has helped   Disequilibrium 06/20/2022   Extensor tendon laceration of right hand with open wound 09/16/2016   Fatigue 06/10/2017   Fatty liver 03/01/2010   LFTs normal. Still needs more weight loss   GERD (gastroesophageal reflux disease) 09/30/2006   Tried omeprazole and symptoms all came back. Will order the nexium again    Glaucoma    Hyperlipemia 09/30/2006   Inconsistent with the atorvastatin. Discussed---will take it twice a week   Hyperlipidemia    Hypogonadism in male 09/19/2009   Kidney stones    Laryngopharyngeal reflux (LPR) 06/03/2016   Left foot pain 09/20/2019   Major depressive disorder    Mild neurocognitive disorder 05/18/2020   OSA (obstructive sleep apnea) 09/30/2006   NPSG 07/2014:  AHI 23/hr, PLMS 452 with 12/hr with A/A NPSG 07/2014:  AHI 23/hr, PLMS 452 with 12/hr with A/A. The patient has moderate obstructive sleep apnea by his recent study, and is very symptomatic during the day. I have discussed with him the various treatment options, and have recommended a trial of C P   Sensorineural hearing loss (SNHL) of both ears 06/20/2022   Shortness of breath 05/21/2015   09/11/17 Right and Left Heart cath: Right radial and brachial access.Left dominant coronary system with no CAD RHC with some exercise showed RA 15, 14 mm Hg (mean 12)PA 37/14  mean 27,PWP 17,15 mean 16   Skin lesion 11/06/2021   Tinnitus of both ears 06/20/2022   Type 2 DM with diabetic neuropathy affecting both sides of body (HCC) 03/05/2009   HGBA1C 8.7 (A) 03/30/2018. Not much better. I want to send him to endocrine--he wants to hold off (doesn't want insulin, etc) Needs formal help---like Weight Watchers Will give him 3 months more   Vitamin B12 deficiency 06/16/2017   Does take  supplement daily. Will check with full labs at next visit    MEDICATIONS: Current Outpatient Medications on File Prior to Visit  Medication Sig Dispense Refill   ALPRAZolam (XANAX) 0.5 MG tablet TAKE 1 TABLET BY MOUTH THREE TIMES A DAY AS NEEDED 90 tablet 0   aspirin 81 MG chewable tablet Chew by mouth.     benzonatate (TESSALON) 200 MG capsule Take 1 capsule (200 mg total) by mouth 3 (three) times daily as needed for cough. 60 capsule 1   Blood Glucose Calibration (ONETOUCH VERIO) SOLN AS DIRECTED 1 each 1   Blood Glucose Monitoring Suppl  (ONETOUCH VERIO FLEX SYSTEM) w/Device KIT Use as directed,once daily to test blood sugar 1 kit 0   BYDUREON BCISE 2 MG/0.85ML AUIJ INJECT 2 MG INTO THE SKIN ONCE A WEEK. 10.2 mL 11   cyclobenzaprine (FLEXERIL) 10 MG tablet Take by mouth.     donepezil (ARICEPT) 5 MG tablet Take 1/2 tablet daily for 2 weeks, then increase to 1 tablet daily and continue 30 tablet 6   esomeprazole (NEXIUM) 20 MG capsule Take 40 mg by mouth 2 (two) times daily before a meal.     finasteride (PROSCAR) 5 MG tablet TAKE 1 TABLET BY MOUTH EVERY DAY 90 tablet 3   glipiZIDE (GLUCOTROL XL) 10 MG 24 hr tablet TAKE 1 TABLET (10 MG TOTAL) BY MOUTH DAILY WITH BREAKFAST. 90 tablet 3   glucose blood (ONETOUCH VERIO) test strip CHECK BLOOD SUGAR EVERY DAY. E11.42 50 strip 11   hydrocortisone 2.5 % cream APPLY TOPICALLY 3 TIMES DAILY AS NEEDED. 28 g 3   ketorolac (ACULAR) 0.5 % ophthalmic solution USE AS DIRECTED 5 mL 1   metFORMIN (GLUCOPHAGE-XR) 500 MG 24 hr tablet TAKE 2 TABLETS (1,000 MG TOTAL) BY MOUTH IN THE MORNING AND AT BEDTIME. 360 tablet 3   Meth-Hyo-M Bl-Na Phos-Ph Sal (URO-MP) 118 MG CAPS Take 1 capsule by mouth every 6 (six) hours as needed.     ONETOUCH DELICA LANCETS FINE MISC 1 Units by Does not apply route daily. 100 each 2   rosuvastatin (CRESTOR) 10 MG tablet TAKE 1 TABLET BY MOUTH EVERY DAY 90 tablet 3   tamsulosin (FLOMAX) 0.4 MG CAPS capsule TAKE 1 CAPSULE BY MOUTH EVERY DAY AFTER SUPPER 90 capsule 3   urea (CARMOL) 40 % CREA APPLY TOPICALLY 3 TIMES A DAY 28.35 g 1   vitamin B-12 (CYANOCOBALAMIN) 1000 MCG tablet Take 1 tablet (1,000 mcg total) by mouth daily.     dapagliflozin propanediol (FARXIGA) 10 MG TABS tablet Take 1 tablet (10 mg total) by mouth daily before breakfast. 90 tablet 3   nitroGLYCERIN (NITROSTAT) 0.4 MG SL tablet Place under the tongue.     No current facility-administered medications on file prior to visit.    ALLERGIES: Allergies  Allergen Reactions   Iohexol      Code: HIVES,  Desc: PT IS ALLERGIC TO IVP DYE AS NOTED IN PREVIOUS RECORDS IN PACS 02/2009/RM, Onset Date: [id number]    Atorvastatin Other (See Comments)    Myalgias with this and rosuvastin    FAMILY HISTORY: Family History  Problem Relation Age of Onset   Alzheimer's disease Mother    Heart disease Father    COPD Father    Colon cancer Father 83   Prostate cancer Father    Dementia Father    Colon polyps Brother    Alzheimer's disease Maternal Grandmother    Leukemia Brother    Leukemia Other    Stomach  cancer Neg Hx    Esophageal cancer Neg Hx    Rectal cancer Neg Hx     SOCIAL HISTORY: Social History   Socioeconomic History   Marital status: Married    Spouse name: Not on file   Number of children: 1   Years of education: 14   Highest education level: Associate degree: academic program  Occupational History   Occupation: Retired    Comment: EMS, Network engineer  Tobacco Use   Smoking status: Former    Current packs/day: 0.00    Average packs/day: 2.5 packs/day for 7.0 years (17.5 ttl pk-yrs)    Types: Cigarettes    Start date: 03/17/1976    Quit date: 03/18/1983    Years since quitting: 39.7    Passive exposure: Past   Smokeless tobacco: Never  Vaping Use   Vaping status: Never Used  Substance and Sexual Activity   Alcohol use: Not Currently    Comment: occasional glass of wine   Drug use: No   Sexual activity: Not on file  Other Topics Concern   Not on file  Social History Narrative   No living will or health care POA thus far   Wife, then son, should make decisions. May make son the primary person   Would accept resuscitation attempts but wouldn't want to be on machines   No tube feeds if cognitively unaware   Right handed   Social Determinants of Health   Financial Resource Strain: Not on file  Food Insecurity: Not on file  Transportation Needs: Not on file  Physical Activity: Not on file  Stress: Not on file  Social Connections: Not on file  Intimate Partner  Violence: Not on file     PHYSICAL EXAM: Vitals:   12/08/22 1037  BP: 133/79  Pulse: 81  Resp: 18  SpO2: 96%   General: No acute distress Head:  Normocephalic/atraumatic Skin/Extremities: No rash, no edema.  Neurological Exam: alert and awake. No aphasia or dysarthria. Fund of knowledge is appropriate. Attention and concentration are reduced.   Cranial nerves: Pupils equal, round. Extraocular movements intact with no nystagmus. Visual fields full.  No facial asymmetry.  Motor: Bulk and tone normal, muscle strength 5/5 throughout with no pronator drift.   Finger to nose testing intact.  Gait narrow-based and steady, difficulty with tandem walk. Romberg negative.   IMPRESSION: This is a 72 yo RH man with a history of hypertension, hyperlipidemia, OSA, diabetes, anxiety, depression, with dizziness and Mild Neurocognitive disorder.  Mild Cognitive Impairment. Repeat Neuropsychological testing on 06/2022 again with a diagnosis of Mild Neurocognitive Disorder.Compared to prior testing in March 2022, there was some cognitive decline observed. Etiology remains unclear, decline raises concern for early stages of a neurodegenerative illness. We again discussed options, including doing a lumbar puncture and sending for CSF biomarkers for Alzheimer's disease. He expressed concerns about "finding a diagnosis" and worrying about it, including legal issues/competency/driving. Patient was reassured about next steps if this would be the case. We also had an extensive discussion about concerns and misconceptions regarding Donepezil, discussed expectations and side effects. We also discussed how SSRIs can help with mood changes seen with [facility]. He would like to think about these options and will let us know his decision.   2. Dizziness. Etiology unclear, MRI brain no acute changes. MRA head and neck showed focal stenosis in bilateral P2 segments proximally but flow was seen distally. There was no vertebrobasilar  insufficiency seen. P2 stenosis would not typically cause dizziness.  We again discussed evaluation at a tertiary center for second opinion regarding dizziness. He would like to think about this as well.   Follow-up in 5-6 months, call for any changes.   Thank you for allowing me to participate in his care.  Please do not hesitate to call for any questions or concerns.   Patrcia Dolly, M.D.   CC: Dr. Alphonsus Sias

## 2022-12-11 DIAGNOSIS — G4733 Obstructive sleep apnea (adult) (pediatric): Secondary | ICD-10-CM | POA: Diagnosis not present

## 2023-01-02 ENCOUNTER — Ambulatory Visit (INDEPENDENT_AMBULATORY_CARE_PROVIDER_SITE_OTHER): Payer: Medicare HMO | Admitting: Family Medicine

## 2023-01-02 ENCOUNTER — Encounter: Payer: Self-pay | Admitting: Family Medicine

## 2023-01-02 VITALS — BP 136/82 | HR 76 | Temp 97.9°F | Ht 77.0 in | Wt 234.0 lb

## 2023-01-02 DIAGNOSIS — R52 Pain, unspecified: Secondary | ICD-10-CM

## 2023-01-02 DIAGNOSIS — U071 COVID-19: Secondary | ICD-10-CM | POA: Insufficient documentation

## 2023-01-02 DIAGNOSIS — I509 Heart failure, unspecified: Secondary | ICD-10-CM | POA: Diagnosis not present

## 2023-01-02 DIAGNOSIS — I10 Essential (primary) hypertension: Secondary | ICD-10-CM

## 2023-01-02 DIAGNOSIS — G4733 Obstructive sleep apnea (adult) (pediatric): Secondary | ICD-10-CM

## 2023-01-02 DIAGNOSIS — E1142 Type 2 diabetes mellitus with diabetic polyneuropathy: Secondary | ICD-10-CM | POA: Diagnosis not present

## 2023-01-02 DIAGNOSIS — R051 Acute cough: Secondary | ICD-10-CM | POA: Diagnosis not present

## 2023-01-02 LAB — POCT INFLUENZA A/B
Influenza A, POC: NEGATIVE
Influenza B, POC: NEGATIVE

## 2023-01-02 LAB — POC COVID19 BINAXNOW: SARS Coronavirus 2 Ag: POSITIVE — AB

## 2023-01-02 MED ORDER — NIRMATRELVIR/RITONAVIR (PAXLOVID)TABLET: 3.0000 | ORAL_TABLET | Freq: Two times a day (BID) | ORAL | 0 refills | Status: AC

## 2023-01-02 NOTE — Progress Notes (Signed)
Ph: [phone] Fax: [phone]   Patient ID: Alejandro Koch, male    DOB: 10-10-1950, 72 y.o.   MRN: [id number]  This visit was conducted in person.  BP 136/82   Pulse 76   Temp 97.9 F (36.6 C) (Oral)   Ht 6\' 5"  (1.956 m)   Wt 234 lb (106.1 kg)   SpO2 98%   BMI 27.75 kg/m    CC: cough/congestion  Subjective:   HPI: Alejandro Koch is a 72 y.o. male presenting on 01/02/2023 for Cough (C/o prod cough, head congestion and body aches. Sxs started 12/31/22. )   First day of symptoms: 12/31/2022 Tested COVID positive: today 01/02/2023  Current symptoms: productive cough, head > chest congestion, body aches. Some wheezing, fatigue.  No: fevers/chills, ear or tooth pain, abd pain, nausea, diarrhea, dyspnea, ST, HA.  Treatments to date: aspirin  Risk factors include: age, gender, diabetes, HTN, OSA, CHF  COVID vaccination status: x2      Relevant past medical, surgical, family and social history reviewed and updated as indicated. Interim medical history since our last visit reviewed. Allergies and medications reviewed and updated. Outpatient Medications Prior to Visit  Medication Sig Dispense Refill   ALPRAZolam (XANAX) 0.5 MG tablet TAKE 1 TABLET BY MOUTH THREE TIMES A DAY AS NEEDED 90 tablet 0   aspirin 81 MG chewable tablet Chew by mouth.     benzonatate (TESSALON) 200 MG capsule Take 1 capsule (200 mg total) by mouth 3 (three) times daily as needed for cough. 60 capsule 1   Blood Glucose Calibration (ONETOUCH VERIO) SOLN AS DIRECTED 1 each 1   Blood Glucose Monitoring Suppl (ONETOUCH VERIO FLEX SYSTEM) w/Device KIT Use as directed,once daily to test blood sugar 1 kit 0   BYDUREON BCISE 2 MG/0.85ML AUIJ INJECT 2 MG INTO THE SKIN ONCE A WEEK. 10.2 mL 11   cyclobenzaprine (FLEXERIL) 10 MG tablet Take by mouth.     dapagliflozin propanediol (FARXIGA) 10 MG TABS tablet Take 1 tablet (10 mg total) by mouth daily before breakfast. 90 tablet 3   donepezil (ARICEPT) 5 MG  tablet Take 1/2 tablet daily for 2 weeks, then increase to 1 tablet daily and continue 30 tablet 6   esomeprazole (NEXIUM) 20 MG capsule Take 40 mg by mouth 2 (two) times daily before a meal.     finasteride (PROSCAR) 5 MG tablet TAKE 1 TABLET BY MOUTH EVERY DAY 90 tablet 3   glipiZIDE (GLUCOTROL XL) 10 MG 24 hr tablet TAKE 1 TABLET (10 MG TOTAL) BY MOUTH DAILY WITH BREAKFAST. 90 tablet 3   glucose blood (ONETOUCH VERIO) test strip CHECK BLOOD SUGAR EVERY DAY. E11.42 50 strip 11   hydrocortisone 2.5 % cream APPLY TOPICALLY 3 TIMES DAILY AS NEEDED. 28 g 3   ketorolac (ACULAR) 0.5 % ophthalmic solution USE AS DIRECTED 5 mL 1   metFORMIN (GLUCOPHAGE-XR) 500 MG 24 hr tablet TAKE 2 TABLETS (1,000 MG TOTAL) BY MOUTH IN THE MORNING AND AT BEDTIME. 360 tablet 3   Meth-Hyo-M Bl-Na Phos-Ph Sal (URO-MP) 118 MG CAPS Take 1 capsule by mouth every 6 (six) hours as needed.     ONETOUCH DELICA LANCETS FINE MISC 1 Units by Does not apply route daily. 100 each 2   rosuvastatin (CRESTOR) 10 MG tablet TAKE 1 TABLET BY MOUTH EVERY DAY 90 tablet 3   tamsulosin (FLOMAX) 0.4 MG CAPS capsule TAKE 1 CAPSULE BY MOUTH EVERY DAY AFTER SUPPER 90 capsule 3   urea (CARMOL) 40 %  CREA APPLY TOPICALLY 3 TIMES A DAY 28.35 g 1   vitamin B-12 (CYANOCOBALAMIN) 1000 MCG tablet Take 1 tablet (1,000 mcg total) by mouth daily.     nitroGLYCERIN (NITROSTAT) 0.4 MG SL tablet Place under the tongue.     No facility-administered medications prior to visit.     Per HPI unless specifically indicated in ROS section below Review of Systems  Objective:  BP 136/82   Pulse 76   Temp 97.9 F (36.6 C) (Oral)   Ht 6\' 5"  (1.956 m)   Wt 234 lb (106.1 kg)   SpO2 98%   BMI 27.75 kg/m   Wt Readings from Last 3 Encounters:  01/02/23 234 lb (106.1 kg)  12/08/22 234 lb (106.1 kg)  09/22/22 238 lb (108 kg)      Physical Exam Vitals and nursing note reviewed.  Constitutional:      Appearance: Normal appearance. He is not ill-appearing.   HENT:     Head: Normocephalic and atraumatic.     Right Ear: Hearing, tympanic membrane, ear canal and external ear normal. There is no impacted cerumen.     Left Ear: Hearing, tympanic membrane, ear canal and external ear normal. There is no impacted cerumen.     Nose:     Right Sinus: No maxillary sinus tenderness or frontal sinus tenderness.     Left Sinus: No maxillary sinus tenderness or frontal sinus tenderness.     Mouth/Throat:     Mouth: Mucous membranes are moist.     Pharynx: Oropharynx is clear. No oropharyngeal exudate or posterior oropharyngeal erythema.     Comments: Wearing mask Eyes:     Extraocular Movements: Extraocular movements intact.     Conjunctiva/sclera: Conjunctivae normal.     Pupils: Pupils are equal, round, and reactive to light.  Cardiovascular:     Rate and Rhythm: Normal rate and regular rhythm.     Pulses: Normal pulses.     Heart sounds: Normal heart sounds. No murmur heard. Pulmonary:     Effort: Pulmonary effort is normal. No respiratory distress.     Breath sounds: Normal breath sounds. No wheezing, rhonchi or rales.  Musculoskeletal:     Cervical back: Normal range of motion and neck supple. No rigidity.     Right lower leg: No edema.     Left lower leg: No edema.  Lymphadenopathy:     Cervical: No cervical adenopathy.  Skin:    General: Skin is warm and dry.     Findings: No rash.  Neurological:     Mental Status: He is alert.  Psychiatric:        Mood and Affect: Mood normal.        Behavior: Behavior normal.       Results for orders placed or performed in visit on 01/02/23  POC COVID-19 BinaxNow  Result Value Ref Range   SARS Coronavirus 2 Ag Positive (A) Negative  POCT Influenza A/B  Result Value Ref Range   Influenza A, POC Negative Negative   Influenza B, POC Negative Negative   Lab Results  Component Value Date   NA 141 03/18/2022   CL 104 03/18/2022   K 4.4 03/18/2022   CO2 28 03/18/2022   BUN 16 03/18/2022    CREATININE 1.02 03/18/2022   GFR 73.76 03/18/2022   CALCIUM 9.1 03/18/2022   PHOS 3.8 03/18/2022   ALBUMIN 4.1 03/18/2022   ALBUMIN 4.1 03/18/2022   GLUCOSE 155 (H) 03/18/2022    Lab Results  Component Value Date   HGBA1C 7.2 (A) 09/22/2022   Assessment & Plan:   Problem List Items Addressed This Visit     Type 2 DM with diabetic neuropathy affecting both sides of body (HCC)   Benign essential hypertension   OSA (obstructive sleep apnea)   CHF (congestive heart failure) (HCC)   COVID-19 virus infection - Primary    Reviewed currently approved antiviral treatment (Paxlovid) including side effects.  Reviewed expected course of illness, anticipated course of recovery, as well as red flags to suggest COVID pneumonia and/or to seek urgent in-person care. Reviewed current CDC isolation/quarantine guidelines.  Encouraged fluids and rest. Reviewed further supportive care measures at home including vit C 500mg  bid, vit D 2000 IU daily, zinc 100mg  daily, tylenol PRN, pepcid 20mg  BID PRN.   Recommend:  Full dose paxlovid Paxlovid drug interactions:  Alprazolam - caution paxlovid can increase effect of  Rosuvastatin - hold while on paxlovid Tamsulosin - hold while on paxlovid  [email or web address]       Relevant Medications   nirmatrelvir/ritonavir (PAXLOVID) 20 x 150 MG & 10 x 100MG  TABS   Other Visit Diagnoses     Acute cough       Relevant Orders   POC COVID-19 BinaxNow (Completed)   Body aches       Relevant Orders   POCT Influenza A/B (Completed)        Meds ordered this encounter  Medications   nirmatrelvir/ritonavir (PAXLOVID) 20 x 150 MG & 10 x 100MG  TABS    Sig: Take 3 tablets by mouth 2 (two) times daily for 5 days. (Take nirmatrelvir 150 mg two tablets twice daily for 5 days and ritonavir 100 mg one tablet twice daily for 5 days) Patient GFR is 73.76    Dispense:  30 tablet    Refill:  0    Orders Placed This Encounter  Procedures   POC COVID-19 BinaxNow     Order Specific Question:   Previously tested for COVID-19    Answer:   No    Order Specific Question:   Resident in a congregate (group) care setting    Answer:   No    Order Specific Question:   Employed in healthcare setting    Answer:   No   POCT Influenza A/B    Patient Instructions  You have COVID infection. Take Paxlovid antiviral - 3 pills twice daily for 5 days - sent to pharmacy.  Check on cost - if expensive, you can go online to [email or web address] for discount program.   Paxlovid drug interactions:  Alprazolam - caution paxlovid can increase effect of  Rosuvastatin - hold while on paxlovid Tamsulosin - hold while on paxlovid  Follow up plan: No follow-ups on file.  Eustaquio Boyden, MD

## 2023-01-02 NOTE — Assessment & Plan Note (Addendum)
Reviewed currently approved antiviral treatment (Paxlovid) including side effects.  Reviewed expected course of illness, anticipated course of recovery, as well as red flags to suggest COVID pneumonia and/or to seek urgent in-person care. Reviewed current CDC isolation/quarantine guidelines.  Encouraged fluids and rest. Reviewed further supportive care measures at home including vit C 500mg  bid, vit D 2000 IU daily, zinc 100mg  daily, tylenol PRN, pepcid 20mg  BID PRN.   Recommend:  Full dose paxlovid Paxlovid drug interactions:  Alprazolam - caution paxlovid can increase effect of  Rosuvastatin - hold while on paxlovid Tamsulosin - hold while on paxlovid  [email or web address]

## 2023-01-02 NOTE — Patient Instructions (Signed)
You have COVID infection. Take Paxlovid antiviral - 3 pills twice daily for 5 days - sent to pharmacy.  Check on cost - if expensive, you can go online to [email or web address] for discount program.   Paxlovid drug interactions:  Alprazolam - caution paxlovid can increase effect of  Rosuvastatin - hold while on paxlovid Tamsulosin - hold while on paxlovid

## 2023-01-05 ENCOUNTER — Telehealth: Payer: Self-pay | Admitting: Internal Medicine

## 2023-01-05 NOTE — Telephone Encounter (Signed)
Spoke to pt's wife per dpr.

## 2023-01-05 NOTE — Telephone Encounter (Signed)
Patient wife called in and stated that he was diagnosed with Covid and prescribed some medicine. They are wanting a call because they had some questions regarding the medication he was prescribed. Thank you!

## 2023-01-05 NOTE — Telephone Encounter (Signed)
Pt saw Dr Sharen Hones 01-02-23 and tested positive for Covid. Was prescribed Paxlovid. His symptoms started on the 18th, also. He has not started the Paxlovid because the pharmacist was telling him he would have to stop his rosuvastatin and tamsulosin while taking it. He has not started it because he wanted to make sure Dr Alphonsus Sias was aware and if he thought it was safe for pt to take.

## 2023-01-10 DIAGNOSIS — G4733 Obstructive sleep apnea (adult) (pediatric): Secondary | ICD-10-CM | POA: Diagnosis not present

## 2023-02-01 ENCOUNTER — Other Ambulatory Visit: Payer: Self-pay | Admitting: Internal Medicine

## 2023-02-21 ENCOUNTER — Other Ambulatory Visit: Payer: Self-pay | Admitting: Internal Medicine

## 2023-02-28 ENCOUNTER — Other Ambulatory Visit: Payer: Self-pay | Admitting: Internal Medicine

## 2023-03-15 ENCOUNTER — Other Ambulatory Visit: Payer: Self-pay | Admitting: Internal Medicine

## 2023-03-25 ENCOUNTER — Ambulatory Visit: Payer: Medicare HMO | Admitting: Internal Medicine

## 2023-03-25 ENCOUNTER — Encounter: Payer: Self-pay | Admitting: Internal Medicine

## 2023-03-25 VITALS — BP 134/76 | HR 78 | Temp 98.1°F | Ht 77.0 in | Wt 232.4 lb

## 2023-03-25 DIAGNOSIS — G3184 Mild cognitive impairment, so stated: Secondary | ICD-10-CM | POA: Diagnosis not present

## 2023-03-25 DIAGNOSIS — Z7984 Long term (current) use of oral hypoglycemic drugs: Secondary | ICD-10-CM

## 2023-03-25 DIAGNOSIS — E1142 Type 2 diabetes mellitus with diabetic polyneuropathy: Secondary | ICD-10-CM | POA: Diagnosis not present

## 2023-03-25 LAB — POCT GLYCOSYLATED HEMOGLOBIN (HGB A1C): Hemoglobin A1C: 7.2 % — AB (ref 4.0–5.6)

## 2023-03-25 NOTE — Assessment & Plan Note (Signed)
 Lab Results  Component Value Date   HGBA1C 7.2 (A) 03/25/2023   Still good control on farxiga 10, metformin 1000 bid, glipizide 10 daily Has stopped the bydureon ---would cause him to feel bad and have low sugars

## 2023-03-25 NOTE — Assessment & Plan Note (Signed)
 Seems to be just MCI On donepezil per neurologist

## 2023-03-25 NOTE — Progress Notes (Signed)
 Subjective:    Patient ID: Alejandro Koch, male    DOB: 1950/12/12, 73 y.o.   MRN: [id number]  HPI Here for follow up of diabetes and other chronic health conditions With wife  Checking sugars----somewhat high if not careful (like over holidays) 159-170 when high Can be below 100 at times Some foot numbness--no issue with pain  Still has memory issues---but not declining Some trouble organizing--hard to focus on left over work No true functional deficits  Walks dog multiple times a day (often 1 mile at a time)  Current Outpatient Medications on File Prior to Visit  Medication Sig Dispense Refill   ALPRAZolam  (XANAX ) 0.5 MG tablet TAKE 1 TABLET BY MOUTH THREE TIMES A DAY AS NEEDED 90 tablet 0   aspirin 81 MG chewable tablet Chew by mouth.     benzonatate  (TESSALON ) 200 MG capsule Take 1 capsule (200 mg total) by mouth 3 (three) times daily as needed for cough. 60 capsule 1   Blood Glucose Calibration (ONETOUCH VERIO) SOLN AS DIRECTED 1 each 1   Blood Glucose Monitoring Suppl (ONETOUCH VERIO FLEX SYSTEM) w/Device KIT Use as directed,once daily to test blood sugar 1 kit 0   BYDUREON  BCISE 2 MG/0.85ML AUIJ INJECT 2 MG INTO THE SKIN ONCE A WEEK. 10.2 mL 11   cyclobenzaprine  (FLEXERIL ) 10 MG tablet Take by mouth.     dapagliflozin  propanediol (FARXIGA ) 10 MG TABS tablet Take 1 tablet (10 mg total) by mouth daily before breakfast. 90 tablet 3   donepezil  (ARICEPT ) 5 MG tablet Take 1 tablet Daily 90 tablet 3   esomeprazole  (NEXIUM ) 20 MG capsule Take 40 mg by mouth 2 (two) times daily before a meal.     finasteride  (PROSCAR ) 5 MG tablet TAKE 1 TABLET BY MOUTH EVERY DAY 90 tablet 3   glipiZIDE  (GLUCOTROL  XL) 10 MG 24 hr tablet TAKE 1 TABLET (10 MG TOTAL) BY MOUTH DAILY WITH BREAKFAST. 90 tablet 3   glucose blood (ONETOUCH VERIO) test strip CHECK BLOOD SUGAR EVERY DAY. E11.42 50 strip 11   hydrocortisone  2.5 % cream APPLY TOPICALLY 3 TIMES DAILY AS NEEDED. 28 g 3   ketorolac (ACULAR) 0.5 %  ophthalmic solution USE AS DIRECTED 5 mL 1   metFORMIN  (GLUCOPHAGE -XR) 500 MG 24 hr tablet TAKE 2 TABLETS (1,000 MG TOTAL) BY MOUTH IN THE MORNING AND AT BEDTIME. 360 tablet 3   Meth-Hyo-M Bl-Na Phos-Ph Sal (URO-MP) 118 MG CAPS Take 1 capsule by mouth every 6 (six) hours as needed.     ONETOUCH DELICA LANCETS FINE MISC 1 Units by Does not apply route daily. 100 each 2   rosuvastatin  (CRESTOR ) 10 MG tablet TAKE 1 TABLET BY MOUTH EVERY DAY 90 tablet 3   tamsulosin  (FLOMAX ) 0.4 MG CAPS capsule TAKE 2 CAPSULES BY MOUTH DAILY 180 capsule 3   urea  (CARMOL) 40 % CREA APPLY TOPICALLY 3 TIMES A DAY 28.35 g 1   vitamin B-12 (CYANOCOBALAMIN ) 1000 MCG tablet Take 1 tablet (1,000 mcg total) by mouth daily.     nitroGLYCERIN (NITROSTAT) 0.4 MG SL tablet Place under the tongue.     No current facility-administered medications on file prior to visit.    Allergies  Allergen Reactions   Iohexol      Code: HIVES, Desc: PT IS ALLERGIC TO IVP DYE AS NOTED IN PREVIOUS RECORDS IN PACS 02/2009/RM, Onset Date: [id number]    Atorvastatin  Other (See Comments)    Myalgias with this and rosuvastin    Past Medical History:  Diagnosis Date   Abdominal discomfort, epigastric 06/10/2017   Actinic keratoses 05/12/2014   Acute chest pain 12/20/2007   Acute prostatitis 06/10/2017   Classic presentation; will treat with sulfa  x 2 weeks. He did well with this back in March   Allergic rhinitis 09/30/2006   Anemia    Benign essential hypertension 09/30/2006   BP Readings from Last 3 Encounters: 06/16/17 138/72  06/09/17 118/66  12/09/16 136/82   Reasonable control   Blood transfusion without reported diagnosis    40 yrs ago    BPH with obstruction/lower urinary tract symptoms 12/27/2018   Still with mild symptoms. If worsens, will need to go back to the urologist   CHF (congestive heart failure) (HCC)    Chronic cough 07/16/2018   Fits definition of chronic bronchitis except not 2 years in a row. Seeing the pulmonary  doctor. Doesn't feel the breo has helped   Disequilibrium 06/20/2022   Extensor tendon laceration of right hand with open wound 09/16/2016   Fatigue 06/10/2017   Fatty liver 03/01/2010   LFTs normal. Still needs more weight loss   GERD (gastroesophageal reflux disease) 09/30/2006   Tried omeprazole and symptoms all came back. Will order the nexium  again   Glaucoma    Hyperlipemia 09/30/2006   Inconsistent with the atorvastatin . Discussed---will take it twice a week   Hyperlipidemia    Hypogonadism in male 09/19/2009   Kidney stones    Laryngopharyngeal reflux (LPR) 06/03/2016   Left foot pain 09/20/2019   Major depressive disorder    Mild neurocognitive disorder 05/18/2020   OSA (obstructive sleep apnea) 09/30/2006   NPSG 07/2014:  AHI 23/hr, PLMS 452 with 12/hr with A/A NPSG 07/2014:  AHI 23/hr, PLMS 452 with 12/hr with A/A. The patient has moderate obstructive sleep apnea by his recent study, and is very symptomatic during the day. I have discussed with him the various treatment options, and have recommended a trial of C P   Sensorineural hearing loss (SNHL) of both ears 06/20/2022   Shortness of breath 05/21/2015   09/11/17 Right and Left Heart cath: Right radial and brachial access.Left dominant coronary system with no CAD RHC with some exercise showed RA 15, 14 mm Hg (mean 12)PA 37/14  mean 27,PWP 17,15 mean 16   Skin lesion 11/06/2021   Tinnitus of both ears 06/20/2022   Type 2 DM with diabetic neuropathy affecting both sides of body (HCC) 03/05/2009   HGBA1C 8.7 (A) 03/30/2018. Not much better. I want to send him to endocrine--he wants to hold off (doesn't want insulin, etc) Needs formal help---like [employer] Will give him 3 months more   Vitamin B12 deficiency 06/16/2017   Does take supplement daily. Will check with full labs at next visit    Past Surgical History:  Procedure Laterality Date   CARDIAC CATHETERIZATION     [facility] - myocard perf and echo done as well - all  normal- EF 48-55% 2019 April and june 2019   COLONOSCOPY  2008   FINGER SURGERY  2013   infection 2nd finger left hand   POLYPECTOMY     REPAIR FLEXOR TENDON HAND Right 08/2016    Family History  Problem Relation Age of Onset   Alzheimer's disease Mother    Heart disease Father    COPD Father    Colon cancer Father 1   Prostate cancer Father    Dementia Father    Colon polyps Brother    Alzheimer's disease Maternal Grandmother  Leukemia Brother    Leukemia Other    Stomach cancer Neg Hx    Esophageal cancer Neg Hx    Rectal cancer Neg Hx     Social History   Socioeconomic History   Marital status: Married    Spouse name: Not on file   Number of children: 1   Years of education: 14   Highest education level: Associate degree: academic program  Occupational History   Occupation: Retired    Comment: EMS, Network engineer  Tobacco Use   Smoking status: Former    Current packs/day: 0.00    Average packs/day: 2.5 packs/day for 7.0 years (17.5 ttl pk-yrs)    Types: Cigarettes    Start date: 03/17/1976    Quit date: 03/18/1983    Years since quitting: 40.0    Passive exposure: Past   Smokeless tobacco: Never  Vaping Use   Vaping status: Never Used  Substance and Sexual Activity   Alcohol use: Not Currently    Comment: occasional glass of wine   Drug use: No   Sexual activity: Not on file  Other Topics Concern   Not on file  Social History Narrative   No living will or health care POA thus far   Wife, then son, should make decisions. May make son the primary person   Would accept resuscitation attempts but wouldn't want to be on machines   No tube feeds if cognitively unaware   Right handed   Social Drivers of Health   Financial Resource Strain: Not on file  Food Insecurity: Not on file  Transportation Needs: Not on file  Physical Activity: Not on file  Stress: Not on file  Social Connections: Not on file  Intimate Partner Violence: Not on file   Review of  Systems Weight down a few pounds--not eating as much Sleeps better now    Objective:   Physical Exam Constitutional:      Appearance: Normal appearance.  Cardiovascular:     Rate and Rhythm: Normal rate and regular rhythm.     Pulses: Normal pulses.     Heart sounds: No murmur heard.    No gallop.  Pulmonary:     Effort: Pulmonary effort is normal.     Breath sounds: Normal breath sounds. No wheezing or rales.  Musculoskeletal:     Cervical back: Neck supple.     Right lower leg: No edema.     Left lower leg: No edema.  Lymphadenopathy:     Cervical: No cervical adenopathy.  Skin:    Comments: No foot lesions  Neurological:     Mental Status: He is alert.            Assessment & Plan:

## 2023-04-10 ENCOUNTER — Other Ambulatory Visit: Payer: Self-pay | Admitting: Internal Medicine

## 2023-05-06 ENCOUNTER — Other Ambulatory Visit: Payer: Self-pay | Admitting: Internal Medicine

## 2023-05-11 ENCOUNTER — Encounter: Payer: Self-pay | Admitting: Neurology

## 2023-05-11 ENCOUNTER — Ambulatory Visit: Payer: Medicare HMO | Admitting: Neurology

## 2023-05-11 VITALS — BP 115/75 | HR 88 | Ht 77.0 in | Wt 235.6 lb

## 2023-05-11 DIAGNOSIS — R42 Dizziness and giddiness: Secondary | ICD-10-CM

## 2023-05-11 DIAGNOSIS — G3184 Mild cognitive impairment, so stated: Secondary | ICD-10-CM | POA: Diagnosis not present

## 2023-05-11 NOTE — Progress Notes (Signed)
 NEUROLOGY FOLLOW UP OFFICE NOTE  Alejandro Koch [id number] 09/08/1950  HISTORY OF PRESENT ILLNESS: I had the pleasure of seeing Alejandro Koch in follow-up in the neurology clinic on 05/11/2023.  The patient was last seen 5 months ago for memory loss and dizziness. He is again accompanied by his wife Alejandro Koch who helps supplement the history today.  Records and images were personally reviewed where available. Repeat Neuropsychological testing in 06/2022 indicated a diagnosis of Mild Neurocognitive Disorder, with some cognitive decline compared to 2022 testing.Etiology remains unclear, but decline raised concern for early stages of a neurodegenerative illness. We discussed doing CSF testing which he declined, as well as starting Donepezil. His PCP prescribed Donepezil but he did not take it, concerned about how it reportedly affected his brother's wife, "I don't want to get in a hole I can't get out of." He feels his memory is the same, still has some loss, forgetting things and "can't bring it back." He forgets conversations or things he has read. He denies getting lost driving. He keeps up with the stock market but has found it more difficult to stay on top of and watch daily. Alejandro Koch helps with medications, she notes a little more memory loss. Most bills are on autopay. He is independent with dressing and bathing.   He still has some dizziness, "still having those episodes," but not as often as before, no falls. He has had more headaches than usual, no nausea, vomiting, photo/phonophobia. Headaches can last for days, he does not take any medication for them. He is probably sleeping better now without needing sleep medication, he uses his CPAP machine. He does not have the energy he used to have but walks his dog regularly.    History on Initial Assessment 05/15/2020: This is a 73 year old right-handed man with a history of hypertension, hyperlipidemia, OSA, diabetes, anxiety, depression, presenting for  evaluation of memory loss. He feels hie memory is not like it's supposed to be, he used to be pretty sharp and great with numbers. He lives with his wife. His wife started noticing changes about a year ago, he started having mood changes, a lot of edgy when he is usually even-keel. He was losing things frequently and appetite was not like it was. He was losing weight and reporting dizziness. He would repeat himself and get frustrated easily. She noticed his attention span would not be longer than 10-15 minutes, he would get easily distracted. 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 He has occasional word-finding difficulties when he used to be pretty accurate. He denies getting lost driving but has trouble remembering directions to the beach and questions himself more now. He denies missing medications or bill payments. His mother had Alzheimer's disease, his father had memory issues. He rarely drinks alcohol. He had a fall last Christmas where he lost consciousness. No neurosurgical procedures. He thinks his mood is better, he was in a very stressful work environment but did notice he used to be more tolerant but is now more irritable. His wife denies any paranoia or hallucinations.  He states he never had headaches in the past, but over the last 3 months he has had a low grade frontal headache that would occur infrequently. No associated nausea/vomiting. He has dizziness when sitting but more when standing. He describes a spinning sensation where he has to hold on for a few seconds to get his bearings, no falls. He denies any diplopia, dysarthria/dysphagia,  neck/back pain, focal numbness/tingling/weakness, bowel/bladder dysfunction, anosmia, or tremors. Sleep is good with Xanax every night.   Diagnostic Data: MRI brain without contrast done 02/2020 did not show any acute changes, there was mild diffuse atrophy.  Repeat brain MRI without contrast done 02/2022 no  acute changes, there was mild diffuse atrophy, minimal chronic microvascular disease, right maxillary sinusitis.   MRA head and neck on 03/24/22 showed focal severe proximal left P2 stenosis, otherwise patent distally, short-segment mild moderate distal right P2 stenosis, patent to distal aspects. There was a dominant left vertebral artery widely patent to the vertebrobasilar junction, hypoplastic right vertebral artery, both PICA patent. Basilar patent to distal aspect without stenosis.   Neuropsychological testing in 05/2020: diagnosis of Mild Neurocognitive Disorder, etiology unclear. Findings were not consistent with Alzheimer's disease, most likely culprit felt to be a combination of sleep dysfunction, Xanax side effects, and cardiovascular disease, various psychosocial stressors.  Repeat Neuropsychological testing on 06/2022: diagnosis of Mild Neurocognitive Disorder. Primary impairments were with executive functioning and both encoding and delayed retrieval aspects of memory. Compared to prior testing in March 2022, there was some cognitive decline observed. Etiology remains unclear, decline raises concern for early stages of a neurodegenerative illness.  Laboratory Data: Lab Results  Component Value Date   TSH 2.24 06/09/2017   Lab Results  Component Value Date   VITAMINB12 552 08/29/2020   PAST MEDICAL HISTORY: Past Medical History:  Diagnosis Date   Abdominal discomfort, epigastric 06/10/2017   Actinic keratoses 05/12/2014   Acute chest pain 12/20/2007   Acute prostatitis 06/10/2017   Classic presentation; will treat with sulfa x 2 weeks. He did well with this back in March   Allergic rhinitis 09/30/2006   Anemia    Benign essential hypertension 09/30/2006   BP Readings from Last 3 Encounters: 06/16/17 138/72  06/09/17 118/66  12/09/16 136/82   Reasonable control   Blood transfusion without reported diagnosis    40 yrs ago    BPH with obstruction/lower urinary tract symptoms  12/27/2018   Still with mild symptoms. If worsens, will need to go back to the urologist   CHF (congestive heart failure) (HCC)    Chronic cough 07/16/2018   Fits definition of chronic bronchitis except not 2 years in a row. Seeing the pulmonary doctor. Doesn't feel the breo has helped   Disequilibrium 06/20/2022   Extensor tendon laceration of right hand with open wound 09/16/2016   Fatigue 06/10/2017   Fatty liver 03/01/2010   LFTs normal. Still needs more weight loss   GERD (gastroesophageal reflux disease) 09/30/2006   Tried omeprazole and symptoms all came back. Will order the nexium again   Glaucoma    Hyperlipemia 09/30/2006   Inconsistent with the atorvastatin. Discussed---will take it twice a week   Hyperlipidemia    Hypogonadism in male 09/19/2009   Koch stones    Laryngopharyngeal reflux (LPR) 06/03/2016   Left foot pain 09/20/2019   Major depressive disorder    Mild neurocognitive disorder 05/18/2020   OSA (obstructive sleep apnea) 09/30/2006   NPSG 07/2014:  AHI 23/hr, PLMS 452 with 12/hr with A/A NPSG 07/2014:  AHI 23/hr, PLMS 452 with 12/hr with A/A. The patient has moderate obstructive sleep apnea by his recent study, and is very symptomatic during the day. I have discussed with him the various treatment options, and have recommended a trial of C P   Sensorineural hearing loss (SNHL) of both ears 06/20/2022   Shortness of breath 05/21/2015   09/11/17 Right  and Left Heart cath: Right radial and brachial access.Left dominant coronary system with no CAD RHC with some exercise showed RA 15, 14 mm Hg (mean 12)PA 37/14  mean 27,PWP 17,15 mean 16   Skin lesion 11/06/2021   Tinnitus of both ears 06/20/2022   Type 2 DM with diabetic neuropathy affecting both sides of body (HCC) 03/05/2009   HGBA1C 8.7 (A) 03/30/2018. Not much better. I want to send him to endocrine--he wants to hold off (doesn't want insulin, etc) Needs formal help---like Weight [employer] Will give him 3 months  more   Vitamin B12 deficiency 06/16/2017   Does take supplement daily. Will check with full labs at next visit    MEDICATIONS: Current Outpatient Medications on File Prior to Visit  Medication Sig Dispense Refill   ALPRAZolam (XANAX) 0.5 MG tablet TAKE 1 TABLET BY MOUTH THREE TIMES A DAY AS NEEDED 90 tablet 0   aspirin 81 MG chewable tablet Chew by mouth.     benzonatate (TESSALON) 200 MG capsule Take 1 capsule (200 mg total) by mouth 3 (three) times daily as needed for cough. 60 capsule 1   Blood Glucose Calibration (ONETOUCH VERIO) SOLN AS DIRECTED 1 each 1   Blood Glucose Monitoring Suppl (ONETOUCH VERIO FLEX SYSTEM) w/Device KIT Use as directed,once daily to test blood sugar 1 kit 0   cyclobenzaprine (FLEXERIL) 10 MG tablet Take by mouth.     dapagliflozin propanediol (FARXIGA) 10 MG TABS tablet Take 1 tablet (10 mg total) by mouth daily before breakfast. 90 tablet 3   donepezil (ARICEPT) 5 MG tablet Take 1 tablet Daily 90 tablet 3   esomeprazole (NEXIUM) 20 MG capsule Take 40 mg by mouth 2 (two) times daily before a meal.     finasteride (PROSCAR) 5 MG tablet TAKE 1 TABLET BY MOUTH EVERY DAY 90 tablet 3   glipiZIDE (GLUCOTROL XL) 10 MG 24 hr tablet TAKE 1 TABLET (10 MG TOTAL) BY MOUTH DAILY WITH BREAKFAST. 90 tablet 3   glucose blood (ONETOUCH VERIO) test strip CHECK BLOOD SUGAR EVERY DAY 50 strip 11   hydrocortisone 2.5 % cream APPLY TO AFFECTED AREA 3 TIMES A DAY AS NEEDED 28 g 3   ketorolac (ACULAR) 0.5 % ophthalmic solution USE AS DIRECTED 5 mL 1   metFORMIN (GLUCOPHAGE-XR) 500 MG 24 hr tablet TAKE 2 TABLETS (1,000 MG TOTAL) BY MOUTH IN THE MORNING AND AT BEDTIME. 360 tablet 3   Meth-Hyo-M Bl-Na Phos-Ph Sal (URO-MP) 118 MG CAPS Take 1 capsule by mouth every 6 (six) hours as needed.     nitroGLYCERIN (NITROSTAT) 0.4 MG SL tablet Place under the tongue.     ONETOUCH DELICA LANCETS FINE MISC 1 Units by Does not apply route daily. 100 each 2   rosuvastatin (CRESTOR) 10 MG tablet TAKE 1  TABLET BY MOUTH EVERY DAY 90 tablet 3   tamsulosin (FLOMAX) 0.4 MG CAPS capsule TAKE 2 CAPSULES BY MOUTH DAILY 180 capsule 3   urea (CARMOL) 40 % CREA APPLY TOPICALLY 3 TIMES A DAY 28.35 g 1   vitamin B-12 (CYANOCOBALAMIN) 1000 MCG tablet Take 1 tablet (1,000 mcg total) by mouth daily.     No current facility-administered medications on file prior to visit.    ALLERGIES: Allergies  Allergen Reactions   Iohexol      Code: HIVES, Desc: PT IS ALLERGIC TO IVP DYE AS NOTED IN PREVIOUS RECORDS IN PACS 02/2009/RM, Onset Date: [id number]    Atorvastatin Other (See Comments)    Myalgias with this  and rosuvastin    FAMILY HISTORY: Family History  Problem Relation Age of Onset   Alzheimer's disease Mother    Heart disease Father    COPD Father    Colon cancer Father 16   Prostate cancer Father    Dementia Father    Colon polyps Brother    Alzheimer's disease Maternal Grandmother    Leukemia Brother    Leukemia Other    Stomach cancer Neg Hx    Esophageal cancer Neg Hx    Rectal cancer Neg Hx     SOCIAL HISTORY: Social History   Socioeconomic History   Marital status: Married    Spouse name: Not on file   Number of children: 1   Years of education: 14   Highest education level: Associate degree: academic program  Occupational History   Occupation: Retired    Comment: EMS, Network engineer  Tobacco Use   Smoking status: Former    Current packs/day: 0.00    Average packs/day: 2.5 packs/day for 7.0 years (17.5 ttl pk-yrs)    Types: Cigarettes    Start date: 03/17/1976    Quit date: 03/18/1983    Years since quitting: 40.1    Passive exposure: Past   Smokeless tobacco: Never  Vaping Use   Vaping status: Never Used  Substance and Sexual Activity   Alcohol use: Not Currently    Comment: occasional glass of wine   Drug use: No   Sexual activity: Not on file  Other Topics Concern   Not on file  Social History Narrative   No living will or health care POA thus far   Wife, then  son, should make decisions. May make son the primary person   Would accept resuscitation attempts but wouldn't want to be on machines   No tube feeds if cognitively unaware   Right handed   Social Drivers of Health   Financial Resource Strain: Not on file  Food Insecurity: Not on file  Transportation Needs: Not on file  Physical Activity: Not on file  Stress: Not on file  Social Connections: Not on file  Intimate Partner Violence: Not on file     PHYSICAL EXAM: Vitals:   05/11/23 1143  BP: 115/75  Pulse: 88  SpO2: 97%   General: No acute distress Head:  Normocephalic/atraumatic Skin/Extremities: No rash, no edema Neurological Exam: alert and oriented to person, place. No aphasia or dysarthria. Fund of knowledge is appropriate.  Recent memory impaired.  Attention and concentration are normal.  MMSE 25/30    05/11/2023   12:00 PM 07/05/2021   10:00 AM  MMSE - Mini Mental State Exam  Orientation to time 3 3  Orientation to Place 5 5  Registration 3 3  Attention/ Calculation 5 5  Recall 0 0  Language- name 2 objects 2 2  Language- repeat 1 1  Language- follow 3 step command 3 3  Language- read & follow direction 1 1  Write a sentence 1 1  Copy design 1 1  Total score 25 25   Cranial nerves: Pupils equal, round. Extraocular movements intact with no nystagmus. Visual fields full.  No facial asymmetry.  Motor: Bulk and tone normal, muscle strength 5/5 throughout with no pronator drift.   Finger to nose testing intact.  Gait narrow-based and steady,no ataxia. No tremors.    IMPRESSION: This is a 73 yo RH man with a history of hypertension, hyperlipidemia, OSA, diabetes, anxiety, depression, with dizziness and Mild Neurocognitive disorder.  Mild Cognitive  Impairment. Repeat Neuropsychological testing on 06/2022 again with a diagnosis of Mild Neurocognitive Disorder.Compared to prior testing in March 2022, there was some cognitive decline observed. Etiology remains unclear,  decline raises concern for early stages of a neurodegenerative illness. He declined doing CSF testing for AD biomarkers. We again discussed his concerns about Donepezil, we discussed expectations, side effects, and misconceptions. He will think about it. Continue control of vascular risk factors, physical and brain stimulation exercises, MIND diet for overall brain health.   2. Dizziness. Etiology unclear, MRI brain no acute changes. MRA head and neck showed focal stenosis in bilateral P2 segments proximally but flow was seen distally. There was no vertebrobasilar insufficiency seen. P2 stenosis would not typically cause dizziness. He reports dizziness does not occur as frequently. If symptoms worsen, would recommend referral to tertiary center for second opinion.  Follow-up in 6-8 months, call for any changes.   Thank you for allowing me to participate in his care.  Please do not hesitate to call for any questions or concerns.    Patrcia Dolly, M.D.   CC: Dr. Alphonsus Sias

## 2023-05-11 NOTE — Patient Instructions (Signed)
 Good to see you! Continue control of blood pressure, cholesterol, sugar. Continue physical and brain stimulation exercises. If at any point you would like to start the Donepezil, let me or your new PCP know. Follow-up in 6-8 months, call for any changes.   RECOMMENDATIONS FOR ALL PATIENTS WITH MEMORY PROBLEMS: 1. Continue to exercise (Recommend 30 minutes of walking everyday, or 3 hours every week) 2. Increase social interactions - continue going to [place] and enjoy social gatherings with friends and family 3. Eat healthy, avoid fried foods and eat more fruits and vegetables 4. Maintain adequate blood pressure, blood sugar, and blood cholesterol level. Reducing the risk of stroke and cardiovascular disease also helps promoting better memory. 5. Avoid stressful situations. Live a simple life and avoid aggravations. Organize your time and prepare for the next day in anticipation. 6. Sleep well, avoid any interruptions of sleep and avoid any distractions in the bedroom that may interfere with adequate sleep quality 7. Avoid sugar, avoid sweets as there is a strong link between excessive sugar intake, diabetes, and cognitive impairment We discussed the Mediterranean diet, which has been shown to help patients reduce the risk of progressive memory disorders and reduces cardiovascular risk. This includes eating fish, eat fruits and green leafy vegetables, nuts like almonds and hazelnuts, walnuts, and also use olive oil. Avoid fast foods and fried foods as much as possible. Avoid sweets and sugar as sugar use has been linked to worsening of memory function.  There is always a concern of gradual progression of memory problems. If this is the case, then we may need to adjust level of care according to patient needs. Support, both to the patient and caregiver, should then be put into place.       Mediterranean Diet  Why follow it? Research shows. Those who follow the Mediterranean diet have a reduced risk  of heart disease  The diet is associated with a reduced incidence of Parkinson's and Alzheimer's diseases People following the diet may have longer life expectancies and lower rates of chronic diseases  The Dietary Guidelines for Americans recommends the Mediterranean diet as an eating plan to promote health and prevent disease  What Is the Mediterranean Diet?  Healthy eating plan based on typical foods and recipes of Mediterranean-style cooking The diet is primarily a plant based diet; these foods should make up a majority of meals   Starches - Plant based foods should make up a majority of meals - They are an important sources of vitamins, minerals, energy, antioxidants, and fiber - Choose whole grains, foods high in fiber and minimally processed items  - Typical grain sources include wheat, oats, barley, corn, brown rice, bulgar, farro, millet, polenta, couscous  - Various types of beans include chickpeas, lentils, fava beans, black beans, white beans   Fruits  Veggies - Large quantities of antioxidant rich fruits & veggies; 6 or more servings  - Vegetables can be eaten raw or lightly drizzled with oil and cooked  - Vegetables common to the traditional Mediterranean Diet include: artichokes, arugula, beets, broccoli, brussel sprouts, cabbage, carrots, celery, collard greens, cucumbers, eggplant, kale, leeks, lemons, lettuce, mushrooms, okra, onions, peas, peppers, potatoes, pumpkin, radishes, rutabaga, shallots, spinach, sweet potatoes, turnips, zucchini - Fruits common to the Mediterranean Diet include: apples, apricots, avocados, cherries, clementines, dates, figs, grapefruits, grapes, melons, nectarines, oranges, peaches, pears, pomegranates, strawberries, tangerines  Fats - Replace butter and margarine with healthy oils, such as olive oil, canola oil, and tahini  - Limit nuts  to no more than a handful a day  - Nuts include walnuts, almonds, pecans, pistachios, pine nuts  - Limit or avoid  candied, honey roasted or heavily salted nuts - Olives are central to the Mediterranean diet - can be eaten whole or used in a variety of dishes   Meats Protein - Limiting red meat: no more than a few times a month - When eating red meat: choose lean cuts and keep the portion to the size of deck of cards - Eggs: approx. 0 to 4 times a week  - Fish and lean poultry: at least 2 a week  - Healthy protein sources include, chicken, [place], lean beef, lamb - Increase intake of seafood such as tuna, salmon, trout, mackerel, shrimp, scallops - Avoid or limit high fat processed meats such as sausage and bacon  Dairy - Include moderate amounts of low fat dairy products  - Focus on healthy dairy such as fat free yogurt, skim milk, low or reduced fat cheese - Limit dairy products higher in fat such as whole or 2% milk, cheese, ice cream  Alcohol - Moderate amounts of red wine is ok  - No more than 5 oz daily for women (all ages) and men older than age 56  - No more than 10 oz of wine daily for men younger than 57  Other - Limit sweets and other desserts  - Use herbs and spices instead of salt to flavor foods  - Herbs and spices common to the traditional Mediterranean Diet include: basil, bay leaves, chives, cloves, cumin, fennel, garlic, lavender, marjoram, mint, oregano, parsley, pepper, rosemary, sage, savory, sumac, tarragon, thyme   It's not just a diet, it's a lifestyle:  The Mediterranean diet includes lifestyle factors typical of those in the region  Foods, drinks and meals are best eaten with others and savored Daily physical activity is important for overall good health This could be strenuous exercise like running and aerobics This could also be more leisurely activities such as walking, housework, yard-work, or taking the stairs Moderation is the key; a balanced and healthy diet accommodates most foods and drinks Consider portion sizes and frequency of consumption of certain foods   Meal  Ideas & Options:  Breakfast:  Whole wheat toast or whole wheat English muffins with peanut butter & hard boiled egg Steel cut oats topped with apples & cinnamon and skim milk  Fresh fruit: banana, strawberries, melon, berries, peaches  Smoothies: strawberries, bananas, greek yogurt, peanut butter Low fat greek yogurt with blueberries and granola  Egg white omelet with spinach and mushrooms Breakfast couscous: whole wheat couscous, apricots, skim milk, cranberries  Sandwiches:  Hummus and grilled vegetables (peppers, zucchini, squash) on whole wheat bread   Grilled chicken on whole wheat pita with lettuce, tomatoes, cucumbers or tzatziki  [place] salad on whole wheat bread: tuna salad made with greek yogurt, olives, red peppers, capers, green onions Garlic rosemary lamb pita: lamb sauted with garlic, rosemary, salt & pepper; add lettuce, cucumber, greek yogurt to pita - flavor with lemon juice and black pepper  Seafood:  Mediterranean grilled salmon, seasoned with garlic, basil, parsley, lemon juice and black pepper Shrimp, lemon, and spinach whole-grain pasta salad made with low fat greek yogurt  Seared scallops with lemon orzo  Seared tuna steaks seasoned salt, pepper, coriander topped with tomato mixture of olives, tomatoes, olive oil, minced garlic, parsley, green onions and cappers  Meats:  Herbed greek chicken salad with kalamata olives, cucumber, feta  Red  bell peppers stuffed with spinach, bulgur, lean ground beef (or lentils) & topped with feta   Kebabs: skewers of chicken, tomatoes, onions, zucchini, squash  [place] burgers: made with red onions, mint, dill, lemon juice, feta cheese topped with roasted red peppers Vegetarian Cucumber salad: cucumbers, artichoke hearts, celery, red onion, feta cheese, tossed in olive oil & lemon juice  Hummus and whole grain pita points with a greek salad (lettuce, tomato, feta, olives, cucumbers, red onion) Lentil soup with celery, carrots made with  vegetable broth, garlic, salt and pepper  Tabouli salad: parsley, bulgur, mint, scallions, cucumbers, tomato, radishes, lemon juice, olive oil, salt and pepper.

## 2023-07-04 ENCOUNTER — Emergency Department (HOSPITAL_COMMUNITY)
Admission: EM | Admit: 2023-07-04 | Discharge: 2023-07-04 | Disposition: A | Discharge status: Home / Self Care | Attending: Emergency Medicine | Admitting: Emergency Medicine

## 2023-07-04 ENCOUNTER — Encounter (HOSPITAL_COMMUNITY): Payer: Self-pay | Admitting: Emergency Medicine

## 2023-07-04 ENCOUNTER — Other Ambulatory Visit: Payer: Self-pay

## 2023-07-04 ENCOUNTER — Emergency Department (HOSPITAL_COMMUNITY)

## 2023-07-04 DIAGNOSIS — R519 Headache, unspecified: Secondary | ICD-10-CM | POA: Diagnosis not present

## 2023-07-04 DIAGNOSIS — I10 Essential (primary) hypertension: Secondary | ICD-10-CM | POA: Diagnosis not present

## 2023-07-04 DIAGNOSIS — Z743 Need for continuous supervision: Secondary | ICD-10-CM | POA: Diagnosis not present

## 2023-07-04 DIAGNOSIS — W19XXXA Unspecified fall, initial encounter: Secondary | ICD-10-CM | POA: Diagnosis not present

## 2023-07-04 DIAGNOSIS — Z7982 Long term (current) use of aspirin: Secondary | ICD-10-CM | POA: Insufficient documentation

## 2023-07-04 DIAGNOSIS — S0990XA Unspecified injury of head, initial encounter: Secondary | ICD-10-CM | POA: Diagnosis not present

## 2023-07-04 DIAGNOSIS — W06XXXA Fall from bed, initial encounter: Secondary | ICD-10-CM | POA: Diagnosis not present

## 2023-07-04 DIAGNOSIS — M542 Cervicalgia: Secondary | ICD-10-CM | POA: Diagnosis not present

## 2023-07-04 DIAGNOSIS — R4182 Altered mental status, unspecified: Secondary | ICD-10-CM | POA: Diagnosis not present

## 2023-07-04 DIAGNOSIS — S01312A Laceration without foreign body of left ear, initial encounter: Secondary | ICD-10-CM

## 2023-07-04 DIAGNOSIS — S0101XA Laceration without foreign body of scalp, initial encounter: Secondary | ICD-10-CM | POA: Diagnosis not present

## 2023-07-04 MED ORDER — LIDOCAINE-EPINEPHRINE-TETRACAINE (LET) TOPICAL GEL
3.0000 mL | Freq: Once | TOPICAL | Status: AC
  Administered 2023-07-04: 3 mL via TOPICAL
  Filled 2023-07-04: qty 3

## 2023-07-04 MED ORDER — LIDOCAINE-EPINEPHRINE (PF) 2 %-1:200000 IJ SOLN
20.0000 mL | Freq: Once | INTRAMUSCULAR | Status: AC
  Administered 2023-07-04: 20 mL via INTRADERMAL
  Filled 2023-07-04: qty 20

## 2023-07-04 MED ORDER — OXYCODONE-ACETAMINOPHEN 5-325 MG PO TABS
2.0000 | ORAL_TABLET | Freq: Once | ORAL | Status: AC
  Administered 2023-07-04: 2 via ORAL
  Filled 2023-07-04: qty 2

## 2023-07-04 MED ORDER — ONDANSETRON 4 MG PO TBDP
4.0000 mg | ORAL_TABLET | Freq: Once | ORAL | Status: AC
  Administered 2023-07-04: 4 mg via ORAL
  Filled 2023-07-04: qty 1

## 2023-07-04 NOTE — ED Provider Notes (Signed)
 [facility] Provider Note   CSN: [id number] Arrival date & time: 07/04/23  0865     History  Chief Complaint  Patient presents with   Alejandro Koch    Alejandro Koch is a 73 y.o. male with no significant past medical history presents emergency department with chief complaint of fall.  Patient states that he "just rolled out of bed."  His wife was at bedside states that she thinks he hit his head on the corner of the nightstand which she keeps open with a glass of water in case he is thirsty at night.  She noted bleeding from his scalp and behind his left ear.  He is complaining of a bad headache and neck pain.  He denies upper extremity weakness or paresthesia and was ambulatory.  No confusion, no loss of consciousness or vomiting.  He is not on any blood thinners.   Fall       Home Medications Prior to Admission medications   Medication Sig Start Date End Date Taking? Authorizing Provider  ALPRAZolam  (XANAX ) 0.5 MG tablet TAKE 1 TABLET BY MOUTH THREE TIMES A DAY AS NEEDED 05/02/21   Helaine Llanos, MD  aspirin 81 MG chewable tablet Chew by mouth. 09/09/16   [provider]  benzonatate  (TESSALON ) 200 MG capsule Take 1 capsule (200 mg total) by mouth 3 (three) times daily as needed for cough. 08/22/22   Tower, Manley Seeds, MD  Blood Glucose Calibration (ONETOUCH VERIO) SOLN AS DIRECTED 09/06/21   Helaine Llanos, MD  Blood Glucose Monitoring Suppl (ONETOUCH VERIO FLEX SYSTEM) w/Device KIT Use as directed,once daily to test blood sugar 09/06/21   Helaine Llanos, MD  cyclobenzaprine  (FLEXERIL ) 10 MG tablet Take by mouth. 09/09/16   [provider]  dapagliflozin  propanediol (FARXIGA ) 10 MG TABS tablet Take 1 tablet (10 mg total) by mouth daily before breakfast. 07/07/22   Helaine Llanos, MD  donepezil  (ARICEPT ) 5 MG tablet Take 1 tablet Daily Patient not taking: Reported on 05/11/2023 02/02/23   Helaine Llanos, MD  esomeprazole   (NEXIUM ) 20 MG capsule Take 40 mg by mouth 2 (two) times daily before a meal.    [provider]  finasteride  (PROSCAR ) 5 MG tablet TAKE 1 TABLET BY MOUTH EVERY DAY 11/03/22   Helaine Llanos, MD  glipiZIDE  (GLUCOTROL  XL) 10 MG 24 hr tablet TAKE 1 TABLET (10 MG TOTAL) BY MOUTH DAILY WITH BREAKFAST. 03/02/23   Helaine Llanos, MD  glucose blood (ONETOUCH VERIO) test strip CHECK BLOOD SUGAR EVERY DAY 04/10/23   Curt Dover I, MD  hydrocortisone  2.5 % cream APPLY TO AFFECTED AREA 3 TIMES A DAY AS NEEDED 05/06/23   Helaine Llanos, MD  ketorolac (ACULAR) 0.5 % ophthalmic solution USE AS DIRECTED 06/27/13   Janece Means, MD  metFORMIN  (GLUCOPHAGE -XR) 500 MG 24 hr tablet TAKE 2 TABLETS (1,000 MG TOTAL) BY MOUTH IN THE MORNING AND AT BEDTIME. 05/27/22   Helaine Llanos, MD  Meth-Hyo-M Bl-Na Phos-Ph Sal (URO-MP) 118 MG CAPS Take 1 capsule by mouth every 6 (six) hours as needed. 05/09/16   [provider]  nitroGLYCERIN (NITROSTAT) 0.4 MG SL tablet Place under the tongue. 06/25/17 05/11/23  [provider]  Raeford Bullion LANCETS FINE MISC 1 Units by Does not apply route daily. 07/26/15   Helaine Llanos, MD  rosuvastatin  (CRESTOR ) 10 MG tablet TAKE 1 TABLET BY MOUTH EVERY DAY 02/23/23   Helaine Llanos,  MD  tamsulosin  (FLOMAX ) 0.4 MG CAPS capsule TAKE 2 CAPSULES BY MOUTH DAILY 03/16/23   Curt Dover I, MD  urea  (CARMOL) 40 % CREA APPLY TOPICALLY 3 TIMES A DAY 11/03/22   Helaine Llanos, MD  vitamin B-12 (CYANOCOBALAMIN ) 1000 MCG tablet Take 1 tablet (1,000 mcg total) by mouth daily. 06/10/17   Claire Crick, MD      Allergies    Iohexol and Atorvastatin     Review of Systems   Review of Systems  Physical Exam Updated Vital Signs BP 119/61   Pulse 74   Temp (!) 97.1 F (36.2 C)   Resp 15   Ht 6\' 5"  (1.956 m)   Wt 106 kg   SpO2 95%   BMI 27.71 kg/m  Physical Exam Vitals and nursing note reviewed.  Constitutional:      General: He is not in acute  distress.    Appearance: He is well-developed. He is not diaphoretic.  HENT:     Head: Normocephalic.     Comments: laceration without active bleeding from the right parietal scalp,  tear behind the pinna of the left ear where the ear meets the mastoid no active bleeding, no cartilage involvement Eyes:     General: No scleral icterus.    Conjunctiva/sclera: Conjunctivae normal.  Cardiovascular:     Rate and Rhythm: Normal rate and regular rhythm.     Heart sounds: Normal heart sounds.  Pulmonary:     Effort: Pulmonary effort is normal. No respiratory distress.     Breath sounds: Normal breath sounds.  Abdominal:     Palpations: Abdomen is soft.     Tenderness: There is no abdominal tenderness.  Musculoskeletal:     Cervical back: Normal range of motion and neck supple.     Comments: Full range of motion of the upper and lower extremities no deformities no bruising No midline spinal tenderness  Skin:    General: Skin is warm and dry.  Neurological:     General: No focal deficit present.     Mental Status: He is alert and oriented to person, place, and time.     Cranial Nerves: No cranial nerve deficit.     Sensory: No sensory deficit.     Motor: No weakness.     Coordination: Coordination normal.     Deep Tendon Reflexes: Reflexes normal.  Psychiatric:        Behavior: Behavior normal.     ED Results / Procedures / Treatments   Labs (all labs ordered are listed, but only abnormal results are displayed) Labs Reviewed - No data to display  EKG EKG Interpretation Date/Time:  Saturday July 04 2023 06:35:56 EDT Ventricular Rate:  78 PR Interval:  141 QRS Duration:  106 QT Interval:  405 QTC Calculation: 462 R Axis:   -51  Text Interpretation: Sinus rhythm Atrial premature complexes S1,S2,S3 pattern Confirmed by Ballard Bongo ([id number]) on 07/04/2023 6:39:43 AM  Radiology CT Head Wo Contrast Result Date: 07/04/2023 CLINICAL DATA:  Status post fall.  Laceration  to the back of head. EXAM: CT HEAD WITHOUT CONTRAST CT CERVICAL SPINE WITHOUT CONTRAST TECHNIQUE: Multidetector CT imaging of the head and cervical spine was performed following the standard protocol without intravenous contrast. Multiplanar CT image reconstructions of the cervical spine were also generated. RADIATION DOSE REDUCTION: This exam was performed according to the departmental dose-optimization program which includes automated exposure control, adjustment of the mA and/or kV according to patient size and/or use of iterative reconstruction  technique. COMPARISON:  MRI 03/24/2022 FINDINGS: CT HEAD FINDINGS Brain: No evidence of acute infarction, hemorrhage, hydrocephalus, extra-axial collection or mass lesion/mass effect. There is mild patchy low-attenuation within the subcortical and periventricular white matter compatible with chronic microvascular disease. Vascular: No hyperdense vessel or unexpected calcification. Skull: Normal. Negative for fracture or focal lesion. Sinuses/Orbits: No acute finding. Other: Small left posterior scalp laceration and hematoma, image 30/2. CT CERVICAL SPINE FINDINGS Alignment: Normal. Skull base and vertebrae: No acute fracture. No primary bone lesion or focal pathologic process. Soft tissues and spinal canal: No prevertebral fluid or swelling. No visible canal hematoma. Disc levels: There is multilevel endplate spurring and mild disc space narrowing noted throughout the cervical spine. Upper chest: Negative. Other: None IMPRESSION: 1. No acute intracranial abnormalities. 2. Mild chronic microvascular disease. 3. No evidence for cervical spine fracture or subluxation. 4. Mild cervical degenerative change. Electronically Signed   By: Kimberley Penman M.D.   On: 07/04/2023 07:16   CT Cervical Spine Wo Contrast Result Date: 07/04/2023 CLINICAL DATA:  Status post fall.  Laceration to the back of head. EXAM: CT HEAD WITHOUT CONTRAST CT CERVICAL SPINE WITHOUT CONTRAST TECHNIQUE:  Multidetector CT imaging of the head and cervical spine was performed following the standard protocol without intravenous contrast. Multiplanar CT image reconstructions of the cervical spine were also generated. RADIATION DOSE REDUCTION: This exam was performed according to the departmental dose-optimization program which includes automated exposure control, adjustment of the mA and/or kV according to patient size and/or use of iterative reconstruction technique. COMPARISON:  MRI 03/24/2022 FINDINGS: CT HEAD FINDINGS Brain: No evidence of acute infarction, hemorrhage, hydrocephalus, extra-axial collection or mass lesion/mass effect. There is mild patchy low-attenuation within the subcortical and periventricular white matter compatible with chronic microvascular disease. Vascular: No hyperdense vessel or unexpected calcification. Skull: Normal. Negative for fracture or focal lesion. Sinuses/Orbits: No acute finding. Other: Small left posterior scalp laceration and hematoma, image 30/2. CT CERVICAL SPINE FINDINGS Alignment: Normal. Skull base and vertebrae: No acute fracture. No primary bone lesion or focal pathologic process. Soft tissues and spinal canal: No prevertebral fluid or swelling. No visible canal hematoma. Disc levels: There is multilevel endplate spurring and mild disc space narrowing noted throughout the cervical spine. Upper chest: Negative. Other: None IMPRESSION: 1. No acute intracranial abnormalities. 2. Mild chronic microvascular disease. 3. No evidence for cervical spine fracture or subluxation. 4. Mild cervical degenerative change. Electronically Signed   By: Kimberley Penman M.D.   On: 07/04/2023 07:16    Procedures .Laceration Repair  Date/Time: 07/04/2023 9:23 AM  Performed by: Tama Fails, PA-C Authorized by: Tama Fails, PA-C   Consent:    Consent obtained:  Verbal   Consent given by:  Patient   Risks discussed:  Infection, pain and need for additional repair   Alternatives  discussed:  No treatment Universal protocol:    Patient identity confirmed:  Arm band Anesthesia:    Anesthesia method:  Topical application   Topical anesthetic:  LET Laceration details:    Location:  Scalp   Scalp location:  L parietal   Length (cm):  4 Pre-procedure details:    Preparation:  Patient was prepped and draped in usual sterile fashion Exploration:    Imaging outcome: foreign body noted     Wound exploration: wound explored through full range of motion   Treatment:    Area cleansed with:  Saline .Laceration Repair  Date/Time: 07/04/2023 9:26 AM  Performed by: Tama Fails, PA-C Authorized by: Juvia Aerts, PA-C  Consent:    Consent obtained:  Verbal   Risks discussed:  Need for additional repair, infection, pain and poor cosmetic result Universal protocol:    Patient identity confirmed:  Arm band Anesthesia:    Anesthesia method:  Topical application and local infiltration   Topical anesthetic:  LET   Local anesthetic:  Lidocaine  2% WITH epi Laceration details:    Location:  Ear   Ear location:  L ear (posterior to ear)   Length (cm):  3 Pre-procedure details:    Preparation:  Patient was prepped and draped in usual sterile fashion Exploration:    Wound exploration: wound explored through full range of motion   Treatment:    Area cleansed with:  Povidone-iodine   Irrigation solution:  Sterile saline   Irrigation method:  Syringe Skin repair:    Repair method:  Sutures   Suture size:  4-0   Wound skin closure material used: vicryl rapide.   Number of sutures:  3 Approximation:    Approximation:  Close Repair type:    Repair type:  Simple Post-procedure details:    Dressing:  Open (no dressing)   Procedure completion:  Tolerated well, no immediate complications     Medications Ordered in ED Medications  oxyCODONE -acetaminophen  (PERCOCET/ROXICET) 5-325 MG per tablet 2 tablet (2 tablets Oral Given 07/04/23 0649)  ondansetron  (ZOFRAN -ODT)  disintegrating tablet 4 mg (4 mg Oral Given 07/04/23 0649)  lidocaine -EPINEPHrine -tetracaine  (LET) topical gel (3 mLs Topical Given 07/04/23 0748)  lidocaine -EPINEPHrine  (XYLOCAINE  W/EPI) 2 %-1:200000 (PF) injection 20 mL (20 mLs Intradermal Given by Other 07/04/23 1610)    ED Course/ Medical Decision Making/ A&P This patient presents to the ED with chief complaint(s) of Fall/ Head injury with pertinent past medical history of Older age, DM which further complicates the presenting complaint. The complaint involves an extensive differential diagnosis and treatment options and also carries with it a high risk of complications and morbidity.    The differential diagnosis includes skull fracture, ICH, Cord syndrome, Cspine fracture, Syncope, Seizure.  Currently patient has no si/sx of severe head injury or unstable c-spine frx,  seizure, syncope.    Additional history obtained: Additional history obtained from spouse    Labs/ Imaging/ED COURSE/ Consultations: MDM: I ordered and independently visualized and interpreted the following imaging  The interpretation of the imaging was limited to assessing for emergent pathology, for which purpose it was ordered. Clinical Course as of 07/04/23 0931  Sat Jul 04, 2023  0643 Last Tdap (BOOSTRIX ) injection 0.5 mL - 03/06/22 [AH]  0710 EKG 12-Lead EKG shows sinus rhythm and a rate of 78 with a few  PACs.  [AH]  0914 CT Head Wo Contrast [AH]  0914 CT Cervical Spine Wo Contrast I personally visualized and interpreted the images using our PACS system. Acute findings include:  none  [AH]    Clinical Course User Index [AH] Kentavius Dettore, PA-C      Medicines ordered and prescription drug management: I ordered the following medications percocet and zofran  for pain     Reevaluation of the patient after these medicines showed that the patient    improved    Disposition: After consideration of the diagnostic results and the patient's response  to treatment,  I feel that the patent would benefit from discharge without si/sx of major head injury .  Return precautions discussed  Medical Decision Making Amount and/or Complexity of Data Reviewed Radiology: ordered. Decision-making details documented in ED Course. ECG/medicine tests:  Decision-making details documented in ED Course.  Risk Prescription drug management.           Final Clinical Impression(s) / ED Diagnoses Final diagnoses:  Fall, initial encounter  Scalp laceration, initial encounter  Laceration of left ear, initial encounter  Minor head injury, initial encounter    Rx / DC Orders ED Discharge Orders     None         Tama Fails, PA-C 07/04/23 1478    Scarlette Currier, MD 07/04/23 2349

## 2023-07-04 NOTE — ED Triage Notes (Addendum)
 Pt BIB EMS from home with c/o headache after a fall with a laceration to the back of head. States that he thinks that he rolled out of the bed. Does not take any blood thinners. Currently being worked up for memory impairment, baseline unable to tell the year. A&O x 2 initially, A&O x 3 here. C-collar in place.  200/100 84HR 98RA Cbg 127

## 2023-07-04 NOTE — Discharge Instructions (Addendum)
*  The laceration behind your left ear was done with dissolving stitches.  These should dissolve in approximately 10 days.  If they are still present after 10 days you may begin to apply petroleum jelly or Neosporin and gently massage the stitches with your fingers to help them degrade a bit faster.  If they are still not dissolving appropriately please get them removed at any local [facility] or urgent care.  Regarding your head injury it may be helpful to elevate the head of the bed, apply ice to your scalp over a towel.  You may take Tylenol  or Advil for pain relief.  Tissue adhesive wound Care: Do not scratch, rub, or pick at the adhesive. Leave tissue adhesive in place. It will come off naturally after 7-10 days. Do not place tape over the adhesive. The adhesive could come off the wound when you pull the tape off. Protect the wound from further injury until it is healed. Check your wound area every day for signs of infection. Check for: More redness, swelling, or pain,Fluid or blood,Warmth, Pus or a bad smell. Do not take baths, swim, or use a hot tub until your health care provider approves. You may only be allowed to take sponge baths. Ask your health care provider if you may take showers.You can usually shower after the first 24 hours. Cover the dressing with a watertight covering when you take a shower. Do not soak the area where there is tissue adhesive. Do not use any soaps, petroleum jelly products, or ointments on the wound. Certain ointments can weaken the adhesive. Get help right away if: You have sudden: Severe headache. Severe vomiting. Unequal pupil size. One is bigger than the other. Vision problems. Confusion or irritability. You have a seizure. Your symptoms get worse. You have clear or bloody fluid coming from your nose or ears. These symptoms may be an emergency. Get help right away. Call 911. Do not wait to see if the symptoms will go away. Do not drive yourself  to the hospital.

## 2023-07-05 ENCOUNTER — Other Ambulatory Visit: Payer: Self-pay | Admitting: Internal Medicine

## 2023-07-10 ENCOUNTER — Other Ambulatory Visit: Payer: Self-pay | Admitting: Internal Medicine

## 2023-07-13 DIAGNOSIS — H401111 Primary open-angle glaucoma, right eye, mild stage: Secondary | ICD-10-CM | POA: Diagnosis not present

## 2023-07-13 LAB — OPHTHALMOLOGY REPORT-SCANNED

## 2023-07-24 ENCOUNTER — Encounter (HOSPITAL_COMMUNITY): Payer: Self-pay

## 2023-07-31 ENCOUNTER — Ambulatory Visit

## 2023-07-31 VITALS — BP 122/78 | Ht 77.0 in | Wt 227.8 lb

## 2023-07-31 DIAGNOSIS — Z1211 Encounter for screening for malignant neoplasm of colon: Secondary | ICD-10-CM | POA: Diagnosis not present

## 2023-07-31 DIAGNOSIS — Z0000 Encounter for general adult medical examination without abnormal findings: Secondary | ICD-10-CM | POA: Diagnosis not present

## 2023-07-31 NOTE — Patient Instructions (Signed)
 Mr. Alejandro Koch , Thank you for taking time out of your busy schedule to complete your Annual Wellness Visit with me. I enjoyed our conversation and look forward to speaking with you again next year. I, as well as your care team,  appreciate your ongoing commitment to your health goals. Please review the following plan we discussed and let me know if I can assist you in the future. Your Game plan/ To Do List    Referrals: If you haven't heard from the office you've been referred to, please reach out to them at the phone provided.  [facility] [address] [place] [address] [place],  [place]  [address] Main: [phone]  Follow up Visits: Next Medicare AWV with our clinical staff: 08/03/24 @ 3pm in person   Have you seen your provider in the last 6 months (3 months if uncontrolled diabetes)? Yes Next Office Visit with your provider: 09/22/23  Clinician Recommendations:  Aim for 30 minutes of exercise or brisk walking, 6-8 glasses of water, and 5 servings of fruits and vegetables each day.       This is a list of the screening recommended for you and due dates:  Health Maintenance  Topic Date Due   Pneumonia Vaccine (3 of 3 - PCV20 or PCV21) 05/20/2020   Colon Cancer Screening  03/03/2023   Yearly kidney function blood test for diabetes  03/19/2023   Yearly kidney health urinalysis for diabetes  03/19/2023   Complete foot exam   06/18/2023   Eye exam for diabetics  08/06/2023   Hemoglobin A1C  09/22/2023   Flu Shot  10/16/2023   Medicare Annual Wellness Visit  07/30/2024   DTaP/Tdap/Td vaccine (5 - Td or Tdap) 03/06/2032   Hepatitis C Screening  Completed   Zoster (Shingles) Vaccine  Completed   HPV Vaccine  Aged Out   Meningitis B Vaccine  Aged Out   COVID-19 Vaccine  Discontinued    Advanced directives: (Declined) Advance directive discussed with you today. Even though you declined this today, please call our office should you change your mind, and we can give you the proper  paperwork for you to fill out. Advance Care Planning is important because it:  [x]  Makes sure you receive the medical care that is consistent with your values, goals, and preferences  [x]  It provides guidance to your family and loved ones and reduces their decisional burden about whether or not they are making the right decisions based on your wishes.  Follow the link provided in your after visit summary or read over the paperwork we have mailed to you to help you started getting your Advance Directives in place. If you need assistance in completing these, please reach out to us  so that we can help you!

## 2023-07-31 NOTE — Progress Notes (Signed)
 Please attest and cosign this visit due to patients primary care provider not being in the office at the time the visit was completed.    Subjective:   Alejandro Koch is a 73 y.o. who presents for a Medicare Wellness preventive visit.  As a reminder, Annual Wellness Visits don't include a physical exam, and some assessments may be limited, especially if this visit is performed virtually. We may recommend an in-person follow-up visit with your provider if needed.  Visit Complete: In person  Persons Participating in Visit: Patient.  AWV Questionnaire: Yes: Patient Medicare AWV questionnaire was completed by the patient on 07/30/23; I have confirmed that all information answered by patient is correct and no changes since this date.  Cardiac Risk Factors include: advanced age (>33men, >76 women);diabetes mellitus;dyslipidemia;hypertension;male gender     Objective:     Today's Vitals   07/31/23 1509  BP: 122/78  Weight: 227 lb 12.8 oz (103.3 kg)  Height: 6\' 5"  (1.956 m)   Body mass index is 27.01 kg/m.     07/31/2023    3:27 PM 07/04/2023    6:34 AM 05/11/2023   11:50 AM 12/08/2022   10:37 AM 08/08/2022   10:16 AM 04/07/2022    2:03 PM 04/02/2022   11:09 AM  Advanced Directives  Does Patient Have a Medical Advance Directive? No No No No No Yes Yes  Would patient like information on creating a medical advance directive?    No - Patient declined       Current Medications (verified) Outpatient Encounter Medications as of 07/31/2023  Medication Sig   ALPRAZolam  (XANAX ) 0.5 MG tablet TAKE 1 TABLET BY MOUTH THREE TIMES A DAY AS NEEDED   aspirin 81 MG chewable tablet Chew by mouth.   Blood Glucose Calibration (ONETOUCH VERIO) SOLN AS DIRECTED   Blood Glucose Monitoring Suppl (ONETOUCH VERIO FLEX SYSTEM) w/Device KIT Use as directed,once daily to test blood sugar   esomeprazole  (NEXIUM ) 20 MG capsule Take 40 mg by mouth 2 (two) times daily before a meal.   FARXIGA  10 MG TABS tablet  TAKE 1 TABLET BY MOUTH DAILY BEFORE BREAKFAST.   finasteride  (PROSCAR ) 5 MG tablet TAKE 1 TABLET BY MOUTH EVERY DAY   glipiZIDE  (GLUCOTROL  XL) 10 MG 24 hr tablet TAKE 1 TABLET (10 MG TOTAL) BY MOUTH DAILY WITH BREAKFAST.   glucose blood (ONETOUCH VERIO) test strip CHECK BLOOD SUGAR EVERY DAY   hydrocortisone  2.5 % cream APPLY TO AFFECTED AREA 3 TIMES A DAY AS NEEDED   ketorolac (ACULAR) 0.5 % ophthalmic solution USE AS DIRECTED   metFORMIN  (GLUCOPHAGE -XR) 500 MG 24 hr tablet TAKE 2 TABLETS (1,000 MG TOTAL) BY MOUTH IN THE MORNING AND AT BEDTIME.   Meth-Hyo-M Bl-Na Phos-Ph Sal (URO-MP) 118 MG CAPS Take 1 capsule by mouth every 6 (six) hours as needed.   ONETOUCH DELICA LANCETS FINE MISC 1 Units by Does not apply route daily.   rosuvastatin  (CRESTOR ) 10 MG tablet TAKE 1 TABLET BY MOUTH EVERY DAY   tamsulosin  (FLOMAX ) 0.4 MG CAPS capsule TAKE 2 CAPSULES BY MOUTH DAILY   urea  (CARMOL) 40 % CREA APPLY TOPICALLY 3 TIMES A DAY   vitamin B-12 (CYANOCOBALAMIN ) 1000 MCG tablet Take 1 tablet (1,000 mcg total) by mouth daily.   benzonatate  (TESSALON ) 200 MG capsule Take 1 capsule (200 mg total) by mouth 3 (three) times daily as needed for cough. (Patient not taking: Reported on 07/31/2023)   cyclobenzaprine  (FLEXERIL ) 10 MG tablet Take by mouth. (Patient not  taking: Reported on 07/31/2023)   donepezil  (ARICEPT ) 5 MG tablet Take 1 tablet Daily (Patient not taking: Reported on 07/31/2023)   nitroGLYCERIN (NITROSTAT) 0.4 MG SL tablet Place under the tongue.   No facility-administered encounter medications on file as of 07/31/2023.    Allergies (verified) Iohexol and Atorvastatin    History: Past Medical History:  Diagnosis Date   Abdominal discomfort, epigastric 06/10/2017   Actinic keratoses 05/12/2014   Acute chest pain 12/20/2007   Acute prostatitis 06/10/2017   Classic presentation; will treat with sulfa  x 2 weeks. He did well with this back in March   Allergic rhinitis 09/30/2006   Anemia     Benign essential hypertension 09/30/2006   BP Readings from Last 3 Encounters: 06/16/17 138/72  06/09/17 118/66  12/09/16 136/82   Reasonable control   Blood transfusion without reported diagnosis    40 yrs ago    BPH with obstruction/lower urinary tract symptoms 12/27/2018   Still with mild symptoms. If worsens, will need to go back to the urologist   CHF (congestive heart failure) (HCC)    Chronic cough 07/16/2018   Fits definition of chronic bronchitis except not 2 years in a row. Seeing the pulmonary doctor. Doesn't feel the breo has helped   Disequilibrium 06/20/2022   Extensor tendon laceration of right hand with open wound 09/16/2016   Fatigue 06/10/2017   Fatty liver 03/01/2010   LFTs normal. Still needs more weight loss   GERD (gastroesophageal reflux disease) 09/30/2006   Tried omeprazole and symptoms all came back. Will order the nexium  again   Glaucoma    Hyperlipemia 09/30/2006   Inconsistent with the atorvastatin . Discussed---will take it twice a week   Hyperlipidemia    Hypogonadism in male 09/19/2009   Kidney stones    Laryngopharyngeal reflux (LPR) 06/03/2016   Left foot pain 09/20/2019   Major depressive disorder    Mild neurocognitive disorder 05/18/2020   OSA (obstructive sleep apnea) 09/30/2006   NPSG 07/2014:  AHI 23/hr, PLMS 452 with 12/hr with A/A NPSG 07/2014:  AHI 23/hr, PLMS 452 with 12/hr with A/A. The patient has moderate obstructive sleep apnea by his recent study, and is very symptomatic during the day. I have discussed with him the various treatment options, and have recommended a trial of C P   Sensorineural hearing loss (SNHL) of both ears 06/20/2022   Shortness of breath 05/21/2015   09/11/17 Right and Left Heart cath: Right radial and brachial access.Left dominant coronary system with no CAD RHC with some exercise showed RA 15, 14 mm Hg (mean 12)PA 37/14  mean 27,PWP 17,15 mean 16   Skin lesion 11/06/2021   Tinnitus of both ears 06/20/2022   Type 2 DM  with diabetic neuropathy affecting both sides of body (HCC) 03/05/2009   HGBA1C 8.7 (A) 03/30/2018. Not much better. I want to send him to endocrine--he wants to hold off (doesn't want insulin, etc) Needs formal help---like Weight Watchers Will give him 3 months more   Vitamin B12 deficiency 06/16/2017   Does take supplement daily. Will check with full labs at next visit   Past Surgical History:  Procedure Laterality Date   CARDIAC CATHETERIZATION     [facility] - myocard perf and echo done as well - all normal- EF 48-55% 2019 April and june 2019   COLONOSCOPY  2008   EYE SURGERY  6 months ago   Cataract x2   FINGER SURGERY  2013   infection 2nd finger left hand   POLYPECTOMY  REPAIR FLEXOR TENDON HAND Right 08/2016   Family History  Problem Relation Age of Onset   Alzheimer's disease Mother    Hypertension Mother    Heart disease Father    COPD Father    Colon cancer Father 17   Prostate cancer Father    Dementia Father    Colon polyps Brother    Alzheimer's disease Maternal Grandmother    Leukemia Brother    Leukemia Other    Diabetes Son    Stomach cancer Neg Hx    Esophageal cancer Neg Hx    Rectal cancer Neg Hx    Social History   Socioeconomic History   Marital status: Married    Spouse name: Not on file   Number of children: 1   Years of education: 14   Highest education level: Tax adviser degree: occupational, Scientist, product/process development, or vocational program  Occupational History   Occupation: Retired    Comment: EMS, Network engineer  Tobacco Use   Smoking status: Former    Current packs/day: 0.00    Average packs/day: 2.5 packs/day for 7.0 years (17.5 ttl pk-yrs)    Types: Cigarettes    Start date: 03/17/1976    Quit date: 03/18/1983    Years since quitting: 40.3    Passive exposure: Past   Smokeless tobacco: Never  Vaping Use   Vaping status: Never Used  Substance and Sexual Activity   Alcohol use: Not Currently    Comment: occasional glass of wine   Drug use: No   Sexual  activity: Not on file  Other Topics Concern   Not on file  Social History Narrative   No living will or health care POA thus far   Wife, then son, should make decisions. May make son the primary person   Would accept resuscitation attempts but wouldn't want to be on machines   No tube feeds if cognitively unaware   Right handed   Social Drivers of Health   Financial Resource Strain: Low Risk  (07/30/2023)   Overall Financial Resource Strain (CARDIA)    Difficulty of Paying Living Expenses: Not hard at all  Food Insecurity: No Food Insecurity (07/30/2023)   Hunger Vital Sign    Worried About Running Out of Food in the Last Year: Never true    Ran Out of Food in the Last Year: Never true  Transportation Needs: No Transportation Needs (07/30/2023)   PRAPARE - Administrator, Civil Service (Medical): No    Lack of Transportation (Non-Medical): No  Physical Activity: Sufficiently Active (07/30/2023)   Exercise Vital Sign    Days of Exercise per Week: 7 days    Minutes of Exercise per Session: 60 min  Stress: Stress Concern Present (07/30/2023)   [employer] of Occupational Health - Occupational Stress Questionnaire    Feeling of Stress : To some extent  Social Connections: Socially Integrated (07/30/2023)   Social Connection and Isolation Panel [NHANES]    Frequency of Communication with Friends and Family: More than three times a week    Frequency of Social Gatherings with Friends and Family: Once a week    Attends Religious Services: More than 4 times per year    Active Member of [employer] or Organizations: Yes    Attends Engineer, structural: More than 4 times per year    Marital Status: Married    Tobacco Counseling Counseling given: Not Answered    Clinical Intake:  Pre-visit preparation completed: Yes  Pain : No/denies pain  BMI - recorded: 27.01 Nutritional Status: BMI 25 -29 Overweight Nutritional Risks: None Diabetes: Yes CBG done?: Yes  (BS 235 this am at home) Did pt. bring in CBG monitor from home?: No  Lab Results  Component Value Date   HGBA1C 7.2 (A) 03/25/2023   HGBA1C 7.2 (A) 09/22/2022   HGBA1C 8.1 (A) 06/18/2022     How often do you need to have someone help you when you read instructions, pamphlets, or other written materials from your doctor or pharmacy?: 1 - Never  Interpreter Needed?: No  Comments: lives with wife Information entered by :: B.Emersen Carroll,LPN   Activities of Daily Living     07/31/2023    3:28 PM  In your present state of health, do you have any difficulty performing the following activities:  Hearing? 1  Vision? 0  Difficulty concentrating or making decisions? 1  Walking or climbing stairs? 0  Dressing or bathing? 0  Doing errands, shopping? 0  Preparing Food and eating ? N  Using the Toilet? N  In the past six months, have you accidently leaked urine? N  Do you have problems with loss of bowel control? N  Managing your Medications? N  Managing your Finances? N  Housekeeping or managing your Housekeeping? N    Patient Care Team: Helaine Llanos, MD as PCP - General (Internal Medicine) Jhonny Moss, MD as Consulting Physician (Neurology)  Indicate any recent Medical Services you may have received from other than [facility] providers in the past year (date may be approximate).     Assessment:    This is a routine wellness examination for Efram.  Hearing/Vision screen Hearing Screening - Comments:: Pt says his hearing was good but now ringing come back. Does not wear hearing aides Vision Screening - Comments:: Pt says his vision is good;readers only;cataracts on both eyes done 6months w/Dr Mason Sole    Goals Addressed             This Visit's Progress    Patient Stated       Pt states he wants to keep weight off, stay healthy, and make money       Depression Screen     07/31/2023    3:22 PM 03/25/2023   10:35 AM 01/02/2023   12:47 PM 08/22/2022   11:09 AM 06/18/2022    10:26 AM 03/01/2021    9:18 AM 07/20/2019    9:07 AM  PHQ 2/9 Scores  PHQ - 2 Score 0 0 0 1 1 1  0  PHQ- 9 Score  1  7       Fall Risk     07/31/2023    3:15 PM 05/11/2023   11:49 AM 03/25/2023   10:35 AM 01/02/2023   12:47 PM 12/08/2022   10:37 AM  Fall Risk   Falls in the past year? 1 0 0 1 0  Comment fell out the bed      Number falls in past yr: 0 0 0 0 0  Injury with Fall? 1 0 0 0 0  Risk for fall due to : No Fall Risks  No Fall Risks    Follow up Education provided;Falls prevention discussed Falls evaluation completed Falls evaluation completed;Falls prevention discussed  Falls evaluation completed    MEDICARE RISK AT HOME:  Medicare Risk at Home Any stairs in or around the home?: Yes If so, are there any without handrails?: Yes Home free of loose throw rugs in walkways, pet beds, electrical cords, etc?: Yes  Adequate lighting in your home to reduce risk of falls?: Yes Life alert?: No Use of a cane, walker or w/c?: No Grab bars in the bathroom?: Yes Shower chair or bench in shower?: Yes Elevated toilet seat or a handicapped toilet?: Yes  TIMED UP AND GO:  Was the test performed?  Yes  Length of time to ambulate 10 feet: 10 sec Gait slow and steady with assistive device  Cognitive Function: 6CIT completed    05/11/2023   12:00 PM 07/05/2021   10:00 AM  MMSE - Mini Mental State Exam  Orientation to time 3 3  Orientation to Place 5 5  Registration 3 3  Attention/ Calculation 5 5  Recall 0 0  Language- name 2 objects 2 2  Language- repeat 1 1  Language- follow 3 step command 3 3  Language- read & follow direction 1 1  Write a sentence 1 1  Copy design 1 1  Total score 25 25      05/15/2020    2:00 PM  Montreal Cognitive Assessment   Visuospatial/ Executive (0/5) 3  Naming (0/3) 3  Attention: Read list of digits (0/2) 2  Attention: Read list of letters (0/1) 1  Attention: Serial 7 subtraction starting at 100 (0/3) 2  Language: Repeat phrase (0/2) 2   Language : Fluency (0/1) 0  Abstraction (0/2) 2  Delayed Recall (0/5) 0  Orientation (0/6) 6  Total 21      07/31/2023    3:38 PM  6CIT Screen  What Year? 0 points  What month? 0 points  What time? 0 points  Count back from 20 0 points  Months in reverse 0 points  Repeat phrase 0 points  Total Score 0 points    Immunizations Immunization History  Administered Date(s) Administered   Fluad Quad(high Dose 65+) 01/07/2020, 03/01/2021   Hep A / Hep B 10/05/2013   Influenza Split 12/09/2010, 12/03/2011   Influenza Whole 12/25/2008, 12/10/2009   Influenza, High Dose Seasonal PF 12/19/2022   Influenza, Seasonal, Injecte, Preservative Fre 12/16/2014, 12/16/2015   Influenza,inj,Quad PF,6+ Mos 12/06/2012, 12/06/2013, 12/09/2016, 12/23/2017   PFIZER(Purple Top)SARS-COV-2 Vaccination 05/10/2019, 05/31/2019   Pneumococcal Conjugate-13 05/21/2015   Pneumococcal Polysaccharide-23 10/05/2013   Td 01/02/2001   Tdap 09/09/2010, 09/07/2016, 03/06/2022   Zoster Recombinant(Shingrix ) 01/18/2019, 07/20/2019   Zoster, Live 12/06/2012    Screening Tests Health Maintenance  Topic Date Due   Pneumonia Vaccine 56+ Years old (3 of 3 - PCV20 or PCV21) 05/20/2020   Colonoscopy  03/03/2023   Diabetic kidney evaluation - eGFR measurement  03/19/2023   Diabetic kidney evaluation - Urine ACR  03/19/2023   FOOT EXAM  06/18/2023   OPHTHALMOLOGY EXAM  08/06/2023   HEMOGLOBIN A1C  09/22/2023   INFLUENZA VACCINE  10/16/2023   Medicare Annual Wellness (AWV)  07/30/2024   DTaP/Tdap/Td (5 - Td or Tdap) 03/06/2032   Hepatitis C Screening  Completed   Zoster Vaccines- Shingrix   Completed   HPV VACCINES  Aged Out   Meningococcal B Vaccine  Aged Out   COVID-19 Vaccine  Discontinued    Health Maintenance  Health Maintenance Due  Topic Date Due   Pneumonia Vaccine 36+ Years old (3 of 3 - PCV20 or PCV21) 05/20/2020   Colonoscopy  03/03/2023   Diabetic kidney evaluation - eGFR measurement  03/19/2023    Diabetic kidney evaluation - Urine ACR  03/19/2023   FOOT EXAM  06/18/2023   Health Maintenance Items Addressed: Referral sent to GI for colonoscopy Pt  will discuss PCV vaccine (last vaccine for pneumonia in 2022). DM kidney and foot exam at visit in July.  Additional Screening:  Vision Screening: Recommended annual ophthalmology exams for early detection of glaucoma and other disorders of the eye.  Dental Screening: Recommended annual dental exams for proper oral hygiene  Community Resource Referral / Chronic Care Management: CRR required this visit?  No   CCM required this visit?  No   Plan:    I have personally reviewed and noted the following in the patient's chart:   Medical and social history Use of alcohol, tobacco or illicit drugs  Current medications and supplements including opioid prescriptions. Patient is not currently taking opioid prescriptions. Functional ability and status Nutritional status Physical activity Advanced directives List of other physicians Hospitalizations, surgeries, and ER visits in previous 12 months Vitals Screenings to include cognitive, depression, and falls Referrals and appointments  In addition, I have reviewed and discussed with patient certain preventive protocols, quality metrics, and best practice recommendations. A written personalized care plan for preventive services as well as general preventive health recommendations were provided to patient.   Nerissa Bannister, LPN   [id number]   After Visit Summary: (MyChart) Due to this being a telephonic visit, the after visit summary with patients personalized plan was offered to patient via MyChart   Notes: Please refer to Routing Comments.

## 2023-09-09 ENCOUNTER — Other Ambulatory Visit: Payer: Self-pay | Admitting: Internal Medicine

## 2023-09-22 ENCOUNTER — Ambulatory Visit: Payer: Medicare HMO | Admitting: Internal Medicine

## 2023-09-22 ENCOUNTER — Encounter: Payer: Self-pay | Admitting: Internal Medicine

## 2023-09-22 VITALS — BP 126/80 | HR 72 | Temp 97.7°F | Ht 77.0 in | Wt 225.8 lb

## 2023-09-22 DIAGNOSIS — G3184 Mild cognitive impairment, so stated: Secondary | ICD-10-CM

## 2023-09-22 DIAGNOSIS — E1142 Type 2 diabetes mellitus with diabetic polyneuropathy: Secondary | ICD-10-CM

## 2023-09-22 DIAGNOSIS — Z7984 Long term (current) use of oral hypoglycemic drugs: Secondary | ICD-10-CM | POA: Diagnosis not present

## 2023-09-22 DIAGNOSIS — Z0000 Encounter for general adult medical examination without abnormal findings: Secondary | ICD-10-CM | POA: Diagnosis not present

## 2023-09-22 DIAGNOSIS — Z125 Encounter for screening for malignant neoplasm of prostate: Secondary | ICD-10-CM | POA: Diagnosis not present

## 2023-09-22 DIAGNOSIS — I10 Essential (primary) hypertension: Secondary | ICD-10-CM | POA: Diagnosis not present

## 2023-09-22 LAB — RENAL FUNCTION PANEL
Albumin: 4.1 g/dL (ref 3.5–5.2)
BUN: 12 mg/dL (ref 6–23)
CO2: 30 meq/L (ref 19–32)
Calcium: 9.1 mg/dL (ref 8.4–10.5)
Chloride: 103 meq/L (ref 96–112)
Creatinine, Ser: 0.97 mg/dL (ref 0.40–1.50)
GFR: 77.52 mL/min (ref >60.00)
Glucose, Bld: 141 mg/dL — ABNORMAL HIGH (ref 70–99)
Phosphorus: 4 mg/dL (ref 2.3–4.6)
Potassium: 4.4 meq/L (ref 3.5–5.1)
Sodium: 140 meq/L (ref 135–145)

## 2023-09-22 LAB — CBC
HCT: 38.3 % — ABNORMAL LOW (ref 39.0–52.0)
Hemoglobin: 13.1 g/dL (ref 13.0–17.0)
MCHC: 34.2 g/dL (ref 30.0–36.0)
MCV: 86.6 fl (ref 78.0–100.0)
Platelets: 206 K/uL (ref 150.0–400.0)
RBC: 4.43 Mil/uL (ref 4.22–5.81)
RDW: 13.2 % (ref 11.5–15.5)
WBC: 4.1 K/uL (ref 4.0–10.5)

## 2023-09-22 LAB — LIPID PANEL
Cholesterol: 139 mg/dL (ref 0–200)
HDL: 46.3 mg/dL (ref >39.00)
LDL Cholesterol: 74 mg/dL (ref 0–99)
NonHDL: 92.57
Total CHOL/HDL Ratio: 3
Triglycerides: 92 mg/dL (ref 0.0–149.0)
VLDL: 18.4 mg/dL (ref 0.0–40.0)

## 2023-09-22 LAB — MICROALBUMIN / CREATININE URINE RATIO
Creatinine,U: 100.3 mg/dL
Microalb Creat Ratio: 13.7 mg/g (ref 0.0–30.0)
Microalb, Ur: 1.4 mg/dL (ref 0.0–1.9)

## 2023-09-22 LAB — HEPATIC FUNCTION PANEL
ALT: 11 U/L (ref 0–53)
AST: 13 U/L (ref 0–37)
Albumin: 4.1 g/dL (ref 3.5–5.2)
Alkaline Phosphatase: 57 U/L (ref 39–117)
Bilirubin, Direct: 0.2 mg/dL (ref 0.0–0.3)
Total Bilirubin: 0.7 mg/dL (ref 0.2–1.2)
Total Protein: 6.3 g/dL (ref 6.0–8.3)

## 2023-09-22 LAB — HM DIABETES FOOT EXAM

## 2023-09-22 LAB — PSA, MEDICARE: PSA: 0.15 ng/mL (ref 0.10–4.00)

## 2023-09-22 LAB — HEMOGLOBIN A1C: Hgb A1c MFr Bld: 7.5 % — ABNORMAL HIGH (ref 4.6–6.5)

## 2023-09-22 NOTE — Progress Notes (Signed)
 Patient would like to Transfer to Dr KANDICE from Dr Jimmy please advise if he will accept this patient transfer

## 2023-09-22 NOTE — Assessment & Plan Note (Signed)
 Needs to schedule his colonoscopy--has been called for this Will do one last PSA (+ FH) Discussed increasing exercise and social engagement Flu this fall --consider RSV Prefers no COVID vaccine

## 2023-09-22 NOTE — Assessment & Plan Note (Signed)
 Seems to still have good control On glipizide  10 daily, farxiga  10, metformin  1000 bid

## 2023-09-22 NOTE — Progress Notes (Signed)
 Subjective:    Patient ID: Alejandro Koch, male    DOB: Apr 02, 1950, 73 y.o.   MRN: [id number]  HPI Here for physical With wife  No new concerns Memory issues are stable--- sees neurologist Stays active--but no set exercise (walks dog multiple times) Limited social interaction---discussed  Checks sugars--- most mornings If careful--- 80's to 120 or 140 Over 200 if not careful Some foot numbness--no sig pain  Current Outpatient Medications on File Prior to Visit  Medication Sig Dispense Refill   ALPRAZolam  (XANAX ) 0.5 MG tablet TAKE 1 TABLET BY MOUTH THREE TIMES A DAY AS NEEDED 90 tablet 0   aspirin 81 MG chewable tablet Chew by mouth.     Blood Glucose Calibration (ONETOUCH VERIO) SOLN AS DIRECTED 1 each 1   Blood Glucose Monitoring Suppl (ONETOUCH VERIO FLEX SYSTEM) w/Device KIT Use as directed,once daily to test blood sugar 1 kit 0   esomeprazole  (NEXIUM ) 20 MG capsule Take 40 mg by mouth 2 (two) times daily before a meal.     FARXIGA  10 MG TABS tablet TAKE 1 TABLET BY MOUTH DAILY BEFORE BREAKFAST. 90 tablet 3   finasteride  (PROSCAR ) 5 MG tablet TAKE 1 TABLET BY MOUTH EVERY DAY 90 tablet 3   glipiZIDE  (GLUCOTROL  XL) 10 MG 24 hr tablet TAKE 1 TABLET (10 MG TOTAL) BY MOUTH DAILY WITH BREAKFAST. 90 tablet 3   glucose blood (ONETOUCH VERIO) test strip CHECK BLOOD SUGAR EVERY DAY 50 strip 11   hydrocortisone  2.5 % cream APPLY TO AFFECTED AREA 3 TIMES A DAY AS NEEDED 28 g 3   ketorolac (ACULAR) 0.5 % ophthalmic solution USE AS DIRECTED 5 mL 1   metFORMIN  (GLUCOPHAGE -XR) 500 MG 24 hr tablet TAKE 2 TABLETS (1,000 MG TOTAL) BY MOUTH IN THE MORNING AND AT BEDTIME. 360 tablet 3   Meth-Hyo-M Bl-Na Phos-Ph Sal (URO-MP) 118 MG CAPS Take 1 capsule by mouth every 6 (six) hours as needed.     nitroGLYCERIN (NITROSTAT) 0.4 MG SL tablet Place under the tongue.     ONETOUCH DELICA LANCETS FINE MISC 1 Units by Does not apply route daily. 100 each 2   rosuvastatin  (CRESTOR ) 10 MG tablet TAKE 1 TABLET  BY MOUTH EVERY DAY 90 tablet 3   tamsulosin  (FLOMAX ) 0.4 MG CAPS capsule TAKE 1 CAPSULE BY MOUTH EVERY DAY AFTER SUPPER 90 capsule 0   urea  (CARMOL) 40 % CREA APPLY TOPICALLY 3 TIMES A DAY 28.35 g 1   vitamin B-12 (CYANOCOBALAMIN ) 1000 MCG tablet Take 1 tablet (1,000 mcg total) by mouth daily.     benzonatate  (TESSALON ) 200 MG capsule Take 1 capsule (200 mg total) by mouth 3 (three) times daily as needed for cough. (Patient not taking: Reported on 09/22/2023) 60 capsule 1   cyclobenzaprine  (FLEXERIL ) 10 MG tablet Take by mouth. (Patient not taking: Reported on 09/22/2023)     donepezil  (ARICEPT ) 5 MG tablet Take 1 tablet Daily (Patient not taking: Reported on 09/22/2023) 90 tablet 3   No current facility-administered medications on file prior to visit.    Allergies  Allergen Reactions   Iohexol      Code: HIVES, Desc: PT IS ALLERGIC TO IVP DYE AS NOTED IN PREVIOUS RECORDS IN PACS 02/2009/RM, Onset Date: [id number]    Atorvastatin  Other (See Comments)    Myalgias with this and rosuvastin    Past Medical History:  Diagnosis Date   Abdominal discomfort, epigastric 06/10/2017   Actinic keratoses 05/12/2014   Acute chest pain 12/20/2007   Acute prostatitis  06/10/2017   Classic presentation; will treat with sulfa  x 2 weeks. He did well with this back in March   Allergic rhinitis 09/30/2006   Anemia    Benign essential hypertension 09/30/2006   BP Readings from Last 3 Encounters: 06/16/17 138/72  06/09/17 118/66  12/09/16 136/82   Reasonable control   Blood transfusion without reported diagnosis    40 yrs ago    BPH with obstruction/lower urinary tract symptoms 12/27/2018   Still with mild symptoms. If worsens, will need to go back to the urologist   CHF (congestive heart failure) (HCC)    Chronic cough 07/16/2018   Fits definition of chronic bronchitis except not 2 years in a row. Seeing the pulmonary doctor. Doesn't feel the breo has helped   Disequilibrium 06/20/2022   Extensor tendon  laceration of right hand with open wound 09/16/2016   Fatigue 06/10/2017   Fatty liver 03/01/2010   LFTs normal. Still needs more weight loss   GERD (gastroesophageal reflux disease) 09/30/2006   Tried omeprazole and symptoms all came back. Will order the nexium  again   Glaucoma    Hyperlipemia 09/30/2006   Inconsistent with the atorvastatin . Discussed---will take it twice a week   Hyperlipidemia    Hypogonadism in male 09/19/2009   Kidney stones    Laryngopharyngeal reflux (LPR) 06/03/2016   Left foot pain 09/20/2019   Major depressive disorder    Mild neurocognitive disorder 05/18/2020   OSA (obstructive sleep apnea) 09/30/2006   NPSG 07/2014:  AHI 23/hr, PLMS 452 with 12/hr with A/A NPSG 07/2014:  AHI 23/hr, PLMS 452 with 12/hr with A/A. The patient has moderate obstructive sleep apnea by his recent study, and is very symptomatic during the day. I have discussed with him the various treatment options, and have recommended a trial of C P   Sensorineural hearing loss (SNHL) of both ears 06/20/2022   Shortness of breath 05/21/2015   09/11/17 Right and Left Heart cath: Right radial and brachial access.Left dominant coronary system with no CAD RHC with some exercise showed RA 15, 14 mm Hg (mean 12)PA 37/14  mean 27,PWP 17,15 mean 16   Skin lesion 11/06/2021   Tinnitus of both ears 06/20/2022   Type 2 DM with diabetic neuropathy affecting both sides of body (HCC) 03/05/2009   HGBA1C 8.7 (A) 03/30/2018. Not much better. I want to send him to endocrine--he wants to hold off (doesn't want insulin, etc) Needs formal help---like Weight [employer] Will give him 3 months more   Vitamin B12 deficiency 06/16/2017   Does take supplement daily. Will check with full labs at next visit    Past Surgical History:  Procedure Laterality Date   CARDIAC CATHETERIZATION     [facility] - myocard perf and echo done as well - all normal- EF 48-55% 2019 April and june 2019   COLONOSCOPY  2008   EYE SURGERY  6 months ago    Cataract x2   FINGER SURGERY  2013   infection 2nd finger left hand   POLYPECTOMY     REPAIR FLEXOR TENDON HAND Right 08/2016    Family History  Problem Relation Age of Onset   Alzheimer's disease Mother    Hypertension Mother    Heart disease Father    COPD Father    Colon cancer Father 53   Prostate cancer Father    Dementia Father    Colon polyps Brother    Alzheimer's disease Maternal Grandmother    Leukemia Brother    Leukemia  Other    Diabetes Son    Stomach cancer Neg Hx    Esophageal cancer Neg Hx    Rectal cancer Neg Hx     Social History   Socioeconomic History   Marital status: Married    Spouse name: Not on file   Number of children: 1   Years of education: 14   Highest education level: Associate degree: academic program  Occupational History   Occupation: Retired    Comment: EMS, Network engineer  Tobacco Use   Smoking status: Former    Current packs/day: 0.00    Average packs/day: 2.5 packs/day for 7.0 years (17.5 ttl pk-yrs)    Types: Cigarettes    Start date: 03/17/1976    Quit date: 03/18/1983    Years since quitting: 40.5    Passive exposure: Past   Smokeless tobacco: Never  Vaping Use   Vaping status: Never Used  Substance and Sexual Activity   Alcohol use: Not Currently    Comment: occasional glass of wine   Drug use: No   Sexual activity: Not on file  Other Topics Concern   Not on file  Social History Narrative   No living will or health care POA thus far   Wife, then son, should make decisions. May make son the primary person   Would accept resuscitation attempts but wouldn't want to be on machines   No tube feeds if cognitively unaware   Right handed   Social Drivers of Health   Financial Resource Strain: Low Risk  (09/16/2023)   Overall Financial Resource Strain (CARDIA)    Difficulty of Paying Living Expenses: Not hard at all  Food Insecurity: No Food Insecurity (09/16/2023)   Hunger Vital Sign    Worried About Running Out of Food  in the Last Year: Never true    Ran Out of Food in the Last Year: Never true  Transportation Needs: No Transportation Needs (09/16/2023)   PRAPARE - Administrator, Civil Service (Medical): No    Lack of Transportation (Non-Medical): No  Physical Activity: Insufficiently Active (09/16/2023)   Exercise Vital Sign    Days of Exercise per Week: 7 days    Minutes of Exercise per Session: 20 min  Stress: No Stress Concern Present (09/16/2023)   [employer] of Occupational Health - Occupational Stress Questionnaire    Feeling of Stress: Only a little  Recent Concern: Stress - Stress Concern Present (07/30/2023)   [employer] of Occupational Health - Occupational Stress Questionnaire    Feeling of Stress : To some extent  Social Connections: Socially Integrated (09/16/2023)   Social Connection and Isolation Panel    Frequency of Communication with Friends and Family: More than three times a week    Frequency of Social Gatherings with Friends and Family: Twice a week    Attends Religious Services: More than 4 times per year    Active Member of [employer] or Organizations: Yes    Attends Engineer, structural: More than 4 times per year    Marital Status: Married  Catering manager Violence: Not At Risk (07/31/2023)   Humiliation, Afraid, Rape, and Kick questionnaire    Fear of Current or Ex-Partner: No    Emotionally Abused: No    Physically Abused: No    Sexually Abused: No    Review of Systems  Constitutional:  Negative for fatigue and unexpected weight change.       Inconsistent  with seat belt  HENT:  Positive  for hearing loss. Negative for trouble swallowing.        Hearing aides help Keeps up with dentist  Eyes:  Negative for visual disturbance.       No diplopia or unilateral vision loss  Respiratory:  Negative for cough, chest tightness and shortness of breath.   Cardiovascular:  Negative for chest pain and leg swelling.       Occ skips  Gastrointestinal:   Negative for blood in stool and constipation.       Mild heartburn---nexium  controls  Endocrine: Negative for polydipsia and polyuria.  Genitourinary:  Negative for urgency.       Mildly slow stream ED--cialis /levitra not helpful  Musculoskeletal:  Negative for arthralgias, back pain and joint swelling.  Skin:  Negative for rash.       Has appt with derm--scaly area on face  Allergic/Immunologic: Positive for environmental allergies. Negative for immunocompromised state.       OTC meds seasonally  Neurological:  Negative for dizziness, syncope, light-headedness and headaches.  Hematological:  Negative for adenopathy. Bruises/bleeds easily.  Psychiatric/Behavioral:  Negative for dysphoric mood and sleep disturbance. The patient is not nervous/anxious.        Sleeps okay with melatonin       Objective:   Physical Exam Constitutional:      Appearance: Normal appearance.  HENT:     Mouth/Throat:     Pharynx: No oropharyngeal exudate or posterior oropharyngeal erythema.  Eyes:     Conjunctiva/sclera: Conjunctivae normal.     Pupils: Pupils are equal, round, and reactive to light.  Cardiovascular:     Rate and Rhythm: Normal rate and regular rhythm.     Pulses: Normal pulses.     Heart sounds: No murmur heard.    No gallop.  Pulmonary:     Effort: Pulmonary effort is normal.     Breath sounds: Normal breath sounds. No wheezing or rales.  Abdominal:     Palpations: Abdomen is soft.     Tenderness: There is no abdominal tenderness.  Musculoskeletal:     Cervical back: Neck supple.     Right lower leg: No edema.     Left lower leg: No edema.  Lymphadenopathy:     Cervical: No cervical adenopathy.  Skin:    Findings: No lesion or rash.     Comments: No foot lesions  Neurological:     General: No focal deficit present.     Mental Status: He is alert and oriented to person, place, and time.     Comments: Sensation normal in feet  Psychiatric:        Mood and Affect: Mood  normal.        Behavior: Behavior normal.            Assessment & Plan:

## 2023-09-22 NOTE — Assessment & Plan Note (Signed)
 BP Readings from Last 3 Encounters:  09/22/23 126/80  07/31/23 122/78  07/04/23 119/61   Good control

## 2023-09-22 NOTE — Assessment & Plan Note (Signed)
Stable cognitive and functional status 

## 2023-09-23 ENCOUNTER — Ambulatory Visit: Payer: Self-pay | Admitting: Internal Medicine

## 2023-09-25 ENCOUNTER — Ambulatory Visit
Admission: EM | Admit: 2023-09-25 | Discharge: 2023-09-25 | Disposition: A | Discharge status: Home / Self Care | Attending: Emergency Medicine | Admitting: Emergency Medicine

## 2023-09-25 DIAGNOSIS — S61217A Laceration without foreign body of left little finger without damage to nail, initial encounter: Secondary | ICD-10-CM

## 2023-09-25 MED ORDER — CEPHALEXIN 500 MG PO CAPS: 500.0000 mg | ORAL_CAPSULE | Freq: Four times a day (QID) | ORAL | 0 refills | Status: DC

## 2023-09-25 NOTE — Discharge Instructions (Addendum)
 Keep your wound clean and dry.  Take the antibiotic as directed.  Watch for signs of infection.  See the attached information on laceration care.  Follow-up with your primary care provider.

## 2023-09-25 NOTE — ED Triage Notes (Signed)
 Patient to Urgent Care with complaints of a laceration on his left, little finger. Reports that he was working on a piece of metal this afternoon and cut his finger.  TDAP: 03/06/2022

## 2023-09-25 NOTE — ED Provider Notes (Signed)
 Alejandro Koch    CSN: [id number] Arrival date & time: 09/25/23  1450      History   Chief Complaint Chief Complaint  Patient presents with   Laceration    HPI Alejandro Koch is a 73 y.o. male.  Patient presents with a laceration on his left fifth finger that occurred yesterday evening when he was cleaning out his dirty gutters and cut his finger on the metal.  He cleaned it.  He noticed that it had a small amount of bleeding this morning.  No active bleeding at this time.  No numbness, weakness, purulent drainage, fever.  Last tetanus 03/06/2022.  His medical history includes diabetes.  The history is provided by the patient and medical records.    Past Medical History:  Diagnosis Date   Abdominal discomfort, epigastric 06/10/2017   Actinic keratoses 05/12/2014   Acute chest pain 12/20/2007   Acute prostatitis 06/10/2017   Classic presentation; will treat with sulfa  x 2 weeks. He did well with this back in March   Allergic rhinitis 09/30/2006   Anemia    Benign essential hypertension 09/30/2006   BP Readings from Last 3 Encounters: 06/16/17 138/72  06/09/17 118/66  12/09/16 136/82   Reasonable control   Blood transfusion without reported diagnosis    40 yrs ago    BPH with obstruction/lower urinary tract symptoms 12/27/2018   Still with mild symptoms. If worsens, will need to go back to the urologist   CHF (congestive heart failure) (HCC)    Chronic cough 07/16/2018   Fits definition of chronic bronchitis except not 2 years in a row. Seeing the pulmonary doctor. Doesn't feel the breo has helped   Disequilibrium 06/20/2022   Extensor tendon laceration of right hand with open wound 09/16/2016   Fatigue 06/10/2017   Fatty liver 03/01/2010   LFTs normal. Still needs more weight loss   GERD (gastroesophageal reflux disease) 09/30/2006   Tried omeprazole and symptoms all came back. Will order the nexium  again   Glaucoma    Hyperlipemia 09/30/2006   Inconsistent with  the atorvastatin . Discussed---will take it twice a week   Hyperlipidemia    Hypogonadism in male 09/19/2009   Kidney stones    Laryngopharyngeal reflux (LPR) 06/03/2016   Left foot pain 09/20/2019   Major depressive disorder    Mild neurocognitive disorder 05/18/2020   OSA (obstructive sleep apnea) 09/30/2006   NPSG 07/2014:  AHI 23/hr, PLMS 452 with 12/hr with A/A NPSG 07/2014:  AHI 23/hr, PLMS 452 with 12/hr with A/A. The patient has moderate obstructive sleep apnea by his recent study, and is very symptomatic during the day. I have discussed with him the various treatment options, and have recommended a trial of C P   Sensorineural hearing loss (SNHL) of both ears 06/20/2022   Shortness of breath 05/21/2015   09/11/17 Right and Left Heart cath: Right radial and brachial access.Left dominant coronary system with no CAD RHC with some exercise showed RA 15, 14 mm Hg (mean 12)PA 37/14  mean 27,PWP 17,15 mean 16   Skin lesion 11/06/2021   Tinnitus of both ears 06/20/2022   Type 2 DM with diabetic neuropathy affecting both sides of body (HCC) 03/05/2009   HGBA1C 8.7 (A) 03/30/2018. Not much better. I want to send him to endocrine--he wants to hold off (doesn't want insulin, etc) Needs formal help---like [employer] Will give him 3 months more   Vitamin B12 deficiency 06/16/2017   Does take supplement daily. Will check  with full labs at next visit    Patient Active Problem List   Diagnosis Date Noted   Glaucoma 07/10/2022   Sensorineural hearing loss (SNHL) of both ears 06/20/2022   Tinnitus of both ears 06/20/2022   Preventative health care 03/01/2021   Mild neurocognitive disorder 05/18/2020   BPH with obstruction/lower urinary tract symptoms 12/27/2018   Vitamin B12 deficiency 06/16/2017   Laryngopharyngeal reflux (LPR) 06/03/2016   Anemia 07/23/2015   Actinic keratosis 05/12/2014   Fatty liver 03/01/2010   Hypogonadism in male 09/19/2009   Type 2 DM with diabetic neuropathy  affecting both sides of body (HCC) 03/05/2009   Hyperlipemia 09/30/2006   Benign essential hypertension 09/30/2006   Allergic rhinitis 09/30/2006   GERD (gastroesophageal reflux disease) 09/30/2006   OSA (obstructive sleep apnea) 09/30/2006    Past Surgical History:  Procedure Laterality Date   CARDIAC CATHETERIZATION     [facility] - myocard perf and echo done as well - all normal- EF 48-55% 2019 April and june 2019   COLONOSCOPY  2008   EYE SURGERY  6 months ago   Cataract x2   FINGER SURGERY  2013   infection 2nd finger left hand   POLYPECTOMY     REPAIR FLEXOR TENDON HAND Right 08/2016       Home Medications    Prior to Admission medications   Medication Sig Start Date End Date Taking? Authorizing Provider  cephALEXin  (KEFLEX ) 500 MG capsule Take 1 capsule (500 mg total) by mouth 4 (four) times daily. 09/25/23  Yes Corlis Burnard DEL, NP  ALPRAZolam  (XANAX ) 0.5 MG tablet TAKE 1 TABLET BY MOUTH THREE TIMES A DAY AS NEEDED 05/02/21   Jimmy Charlie FERNS, MD  aspirin 81 MG chewable tablet Chew by mouth. 09/09/16   [provider]  benzonatate  (TESSALON ) 200 MG capsule Take 1 capsule (200 mg total) by mouth 3 (three) times daily as needed for cough. Patient not taking: Reported on 09/22/2023 08/22/22   Tower, Laine LABOR, MD  Blood Glucose Calibration (ONETOUCH VERIO) SOLN AS DIRECTED 09/06/21   Jimmy Charlie FERNS, MD  Blood Glucose Monitoring Suppl (ONETOUCH VERIO FLEX SYSTEM) w/Device KIT Use as directed,once daily to test blood sugar 09/06/21   Jimmy Charlie FERNS, MD  cyclobenzaprine  (FLEXERIL ) 10 MG tablet Take by mouth. Patient not taking: Reported on 09/22/2023 09/09/16   [provider]  donepezil  (ARICEPT ) 5 MG tablet Take 1 tablet Daily Patient not taking: Reported on 09/22/2023 02/02/23   Jimmy Charlie FERNS, MD  esomeprazole  (NEXIUM ) 20 MG capsule Take 40 mg by mouth 2 (two) times daily before a meal.    [provider]  FARXIGA  10 MG TABS tablet TAKE 1 TABLET BY MOUTH  DAILY BEFORE BREAKFAST. 07/06/23   Jimmy Charlie I, MD  finasteride  (PROSCAR ) 5 MG tablet TAKE 1 TABLET BY MOUTH EVERY DAY 11/03/22   Jimmy Charlie FERNS, MD  glipiZIDE  (GLUCOTROL  XL) 10 MG 24 hr tablet TAKE 1 TABLET (10 MG TOTAL) BY MOUTH DAILY WITH BREAKFAST. 03/02/23   Jimmy Charlie FERNS, MD  glucose blood (ONETOUCH VERIO) test strip CHECK BLOOD SUGAR EVERY DAY 04/10/23   Jimmy Charlie I, MD  hydrocortisone  2.5 % cream APPLY TO AFFECTED AREA 3 TIMES A DAY AS NEEDED 05/06/23   Jimmy Charlie FERNS, MD  ketorolac (ACULAR) 0.5 % ophthalmic solution USE AS DIRECTED 06/27/13   Mavis Norleen BRAVO, MD  metFORMIN  (GLUCOPHAGE -XR) 500 MG 24 hr tablet TAKE 2 TABLETS (1,000 MG TOTAL) BY MOUTH IN THE MORNING AND AT  BEDTIME. 07/10/23   Jimmy Charlie FERNS, MD  Meth-Hyo-M Starlene Phos-Ph Sal (URO-MP) 118 MG CAPS Take 1 capsule by mouth every 6 (six) hours as needed. 05/09/16   [provider]  nitroGLYCERIN (NITROSTAT) 0.4 MG SL tablet Place under the tongue. 06/25/17 09/22/23  [provider]  AISHA PASTOR LANCETS FINE MISC 1 Units by Does not apply route daily. 07/26/15   Jimmy Charlie FERNS, MD  rosuvastatin  (CRESTOR ) 10 MG tablet TAKE 1 TABLET BY MOUTH EVERY DAY 02/23/23   Jimmy Charlie I, MD  tamsulosin  (FLOMAX ) 0.4 MG CAPS capsule TAKE 1 CAPSULE BY MOUTH EVERY DAY AFTER SUPPER 09/09/23   Jimmy Charlie I, MD  urea  (CARMOL) 40 % CREA APPLY TOPICALLY 3 TIMES A DAY 11/03/22   Jimmy Charlie FERNS, MD  vitamin B-12 (CYANOCOBALAMIN ) 1000 MCG tablet Take 1 tablet (1,000 mcg total) by mouth daily. 06/10/17   Rilla Baller, MD    Family History Family History  Problem Relation Age of Onset   Alzheimer's disease Mother    Hypertension Mother    Heart disease Father    COPD Father    Colon cancer Father 30   Prostate cancer Father    Dementia Father    Colon polyps Brother    Alzheimer's disease Maternal Grandmother    Leukemia Brother    Leukemia Other    Diabetes Son    Stomach cancer Neg Hx     Esophageal cancer Neg Hx    Rectal cancer Neg Hx     Social History Social History   Tobacco Use   Smoking status: Former    Current packs/day: 0.00    Average packs/day: 2.5 packs/day for 7.0 years (17.5 ttl pk-yrs)    Types: Cigarettes    Start date: 03/17/1976    Quit date: 03/18/1983    Years since quitting: 40.5    Passive exposure: Past   Smokeless tobacco: Never  Vaping Use   Vaping status: Never Used  Substance Use Topics   Alcohol use: Not Currently    Comment: occasional glass of wine   Drug use: No     Allergies   Iohexol and Atorvastatin    Review of Systems Review of Systems  Constitutional:  Negative for chills and fever.  Musculoskeletal:  Negative for arthralgias and joint swelling.  Skin:  Positive for wound. Negative for color change.  Neurological:  Negative for weakness and numbness.     Physical Exam Triage Vital Signs ED Triage Vitals  Encounter Vitals Group     BP 09/25/23 1542 139/83     Girls Systolic BP Percentile --      Girls Diastolic BP Percentile --      Boys Systolic BP Percentile --      Boys Diastolic BP Percentile --      Pulse Rate 09/25/23 1542 65     Resp 09/25/23 1542 18     Temp 09/25/23 1542 97.8 F (36.6 C)     Temp src --      SpO2 09/25/23 1542 96 %     Weight --      Height --      Head Circumference --      Peak Flow --      Pain Score 09/25/23 1547 0     Pain Loc --      Pain Education --      Exclude from Growth Chart --    No data found.  Updated Vital Signs BP 139/83  Pulse 65   Temp 97.8 F (36.6 C)   Resp 18   SpO2 96%   Visual Acuity Right Eye Distance:   Left Eye Distance:   Bilateral Distance:    Right Eye Near:   Left Eye Near:    Bilateral Near:     Physical Exam Constitutional:      General: He is not in acute distress. HENT:     Mouth/Throat:     Mouth: Mucous membranes are moist.  Cardiovascular:     Rate and Rhythm: Normal rate and regular rhythm.  Pulmonary:     Effort:  Pulmonary effort is normal. No respiratory distress.  Musculoskeletal:        General: No swelling or deformity. Normal range of motion.  Skin:    General: Skin is warm and dry.     Capillary Refill: Capillary refill takes less than 2 seconds.     Findings: Lesion present. No erythema.     Comments: 1 cm v-shaped laceration on side of left fifth finger.  No bleeding or drainage at this time.  Sensation intact, FROM, strength 5/5.  Neurological:     General: No focal deficit present.     Mental Status: He is alert.     Sensory: No sensory deficit.     Motor: No weakness.      UC Treatments / Results  Labs (all labs ordered are listed, but only abnormal results are displayed) Labs Reviewed - No data to display  EKG   Radiology No results found.  Procedures Laceration Repair  Date/Time: 09/25/2023 4:22 PM  Performed by: Corlis Burnard DEL, NP Authorized by: Corlis Burnard DEL, NP   Consent:    Consent obtained:  Verbal   Consent given by:  Patient   Risks discussed:  Infection, pain, poor cosmetic result and poor wound healing Universal protocol:    Procedure explained and questions answered to patient or proxy's satisfaction: yes   Laceration details:    Location:  Finger   Finger location:  L small finger   Length (cm):  1   Depth (mm):  2 Pre-procedure details:    Preparation:  Patient was prepped and draped in usual sterile fashion Exploration:    Hemostasis achieved with:  Direct pressure   Imaging outcome: foreign body not noted     Wound exploration: wound explored through full range of motion and entire depth of wound visualized   Treatment:    Area cleansed with:  Shur-Clens   Amount of cleaning:  Standard   Irrigation solution:  Sterile water   Irrigation method:  Syringe   Visualized foreign bodies/material removed: no   Skin repair:    Repair method:  Steri-Strips   Number of Steri-Strips:  1 Approximation:    Approximation:  Close Repair type:    Repair  type:  Simple Post-procedure details:    Dressing:  Non-adherent dressing   Procedure completion:  Tolerated well, no immediate complications  (including critical care time)  Medications Ordered in UC Medications - No data to display  Initial Impression / Assessment and Plan / UC Course  I have reviewed the triage vital signs and the nursing notes.  Pertinent labs & imaging results that were available during my care of the patient were reviewed by me and considered in my medical decision making (see chart for details).    Laceration of left little finger.  The laceration occurred almost 24 hours ago.  He cut himself while cleaning out the  gutters on his house.  He states they were quite dirty.  He is diabetic.  Wound cleaned here and closed with 1 Steri-Strip.  Finger splint and nonadherent dressing applied.  Treating today with cephalexin .  Tetanus is up-to-date.  Wound care instructions and signs of infection discussed.  Instructed patient to follow-up right away if he notes signs of infection.  Education provided on laceration care.  He agrees to plan of care.  Final Clinical Impressions(s) / UC Diagnoses   Final diagnoses:  Laceration of left little finger without foreign body without damage to nail, initial encounter     Discharge Instructions      Keep your wound clean and dry.  Take the antibiotic as directed.  Watch for signs of infection.  See the attached information on laceration care.  Follow-up with your primary care provider.     ED Prescriptions     Medication Sig Dispense Auth. Provider   cephALEXin  (KEFLEX ) 500 MG capsule Take 1 capsule (500 mg total) by mouth 4 (four) times daily. 28 capsule Corlis Burnard DEL, NP      PDMP not reviewed this encounter.   Corlis Burnard DEL, NP 09/25/23 1623

## 2023-09-30 ENCOUNTER — Encounter: Payer: Self-pay | Admitting: Family Medicine

## 2023-10-02 ENCOUNTER — Other Ambulatory Visit: Payer: Self-pay | Admitting: Internal Medicine

## 2023-10-06 NOTE — Telephone Encounter (Signed)
 Ok to schedule appt with me for TOC in 6 mo.

## 2023-10-14 DIAGNOSIS — X32XXXA Exposure to sunlight, initial encounter: Secondary | ICD-10-CM | POA: Diagnosis not present

## 2023-10-14 DIAGNOSIS — L570 Actinic keratosis: Secondary | ICD-10-CM | POA: Diagnosis not present

## 2023-12-24 ENCOUNTER — Ambulatory Visit: Payer: Self-pay

## 2023-12-24 NOTE — Telephone Encounter (Signed)
 FYI Only or Action Required?: FYI only for provider.  Patient was last seen in primary care on 09/22/2023 by Jimmy Charlie FERNS, MD.  Called Nurse Triage reporting Pain.  Symptoms began x 2 weeks and worsening .  Interventions attempted: Nothing.  Symptoms are: gradually worsening.  Triage Disposition: No disposition on file.  Patient/caregiver understands and will follow disposition?:    Copied from CRM [id number]. Topic: Clinical - Red Word Triage >> Dec 24, 2023 11:50 AM Carlyon D wrote: Red Word that prompted transfer to Nurse Triage: Pt wife Adrien calling states husband fell about 2 weeks ago hurt his shoulder pt shoulder has gotten worse since then and is in a lot of pain shoulder hurts very bad when trying to move it. Reason for Disposition  [1] MODERATE pain (e.g., interferes with normal activities) AND [2] present > 3 days  Answer Assessment - Initial Assessment Questions 1. ONSET: When did the pain start?     X 2  weeks 2. LOCATION: Where is the pain located?     Left shoulder and radiates to left chest: pain with movement only 3. PAIN: How bad is the pain? (Scale 1-10; or mild, moderate, severe)     8/10 to 10/10 4. WORK OR EXERCISE: Has there been any recent work or exercise that involved this part of the body?     [place] and hurt left shoulder 5. CAUSE: What do you think is causing the shoulder pain?     fall 6. OTHER SYMPTOMS: Do you have any other symptoms? (e.g., neck pain, swelling, rash, fever, numbness, weakness)     Limited ROM, weakness in left arm/can't hold anything 7. PREGNANCY: Is there any chance you are pregnant? When was your last menstrual period?     na  fell about 2 weeks ago hurt his left shoulder / and landed on left shoulder  Protocols used: Shoulder Pain-A-AH

## 2023-12-24 NOTE — Telephone Encounter (Signed)
Noted,will see tomorrow. Thanks.

## 2023-12-25 ENCOUNTER — Ambulatory Visit: Admitting: Family Medicine

## 2023-12-25 ENCOUNTER — Ambulatory Visit
Admission: RE | Admit: 2023-12-25 | Discharge: 2023-12-25 | Disposition: A | Discharge status: Home / Self Care | Source: Ambulatory Visit | Attending: Family Medicine | Admitting: Family Medicine

## 2023-12-25 VITALS — BP 130/72 | HR 83 | Temp 98.0°F | Wt 220.0 lb

## 2023-12-25 DIAGNOSIS — M25512 Pain in left shoulder: Secondary | ICD-10-CM

## 2023-12-25 DIAGNOSIS — M19012 Primary osteoarthritis, left shoulder: Secondary | ICD-10-CM | POA: Diagnosis not present

## 2023-12-25 MED ORDER — HYDROCODONE-ACETAMINOPHEN 5-325 MG PO TABS: 1.0000 | ORAL_TABLET | Freq: Three times a day (TID) | ORAL | 0 refills | Status: AC | PRN

## 2023-12-25 NOTE — Patient Instructions (Signed)
 Gently try to move your shoulder and use the pain medicine if needed.   Let me know if you can't get set up with ortho.  Take care.  Glad to see you.

## 2023-12-25 NOTE — Progress Notes (Signed)
 He was walking his dog, his dog bolted and pulled him down.  Had leash in L hand.  No LOC.  This was about 2 weeks ago.  Fell on L shoulder.  He was able to get up.  Pain in the meantime at L shoulder.    Pain sleeping on the L side.  Pain with overhead movement.  Taking ibuprofen for pain.    No neck or R shoulder pain.  No L elbow pain.  Normal grip B.   Meds, vitals, and allergies reviewed.   ROS: Per HPI unless specifically indicated in ROS section   Nad Ncat Neck supple Rrr L shoulder with normal inspection but pain with overhead movement, internal and external rotation.  No arm drop.  Normal elbow range of motion.  Normal grip.  Distally neurovasc intact.  AC joint mildly tender to palpation but most of his pain is with internal and external rotation, abduction.

## 2023-12-27 DIAGNOSIS — M25512 Pain in left shoulder: Secondary | ICD-10-CM | POA: Insufficient documentation

## 2023-12-27 NOTE — Assessment & Plan Note (Signed)
 No fracture seen on x-ray.  Discussed using pendulum swings, routine cautions discussed with hydrocodone  use, referred to orthopedics.  Concern for rotator cuff injury.  Anatomy discussed with patient.  He agrees with plan.

## 2023-12-28 ENCOUNTER — Telehealth: Payer: Self-pay | Admitting: Internal Medicine

## 2023-12-28 NOTE — Telephone Encounter (Unsigned)
 Copied from CRM [id number]. Topic: Referral - Status >> Dec 28, 2023 10:43 AM Dedra B wrote: Reason for CRM: Pt wife, Nimai Burbach, called to follow up on pt referral to ortho? Pls call pt or Debra.

## 2023-12-28 NOTE — Telephone Encounter (Signed)
 Spoke with patients wife advised referral has been placed and can take 1-2 weeks to be notified from referrals team. She will reach out after 2 weeks if she hasn't heard anything.

## 2024-01-01 DIAGNOSIS — M25512 Pain in left shoulder: Secondary | ICD-10-CM | POA: Diagnosis not present

## 2024-01-03 ENCOUNTER — Ambulatory Visit: Payer: Self-pay | Admitting: Family Medicine

## 2024-01-07 ENCOUNTER — Other Ambulatory Visit

## 2024-01-07 ENCOUNTER — Encounter: Payer: Self-pay | Admitting: Neurology

## 2024-01-07 ENCOUNTER — Ambulatory Visit: Admitting: Neurology

## 2024-01-07 VITALS — BP 138/72 | HR 81 | Ht 77.0 in | Wt 223.0 lb

## 2024-01-07 DIAGNOSIS — G3184 Mild cognitive impairment, so stated: Secondary | ICD-10-CM

## 2024-01-07 DIAGNOSIS — R42 Dizziness and giddiness: Secondary | ICD-10-CM | POA: Diagnosis not present

## 2024-01-07 NOTE — Patient Instructions (Signed)
 It's always a pleasure to see you.  Let's proceed with the blood test Lumipulse  2. Follow-up in 6 months, call for any changes   FALL PRECAUTIONS: Be cautious when walking. Scan the area for obstacles that may increase the risk of trips and falls. When getting up in the mornings, sit up at the edge of the bed for a few minutes before getting out of bed. Consider elevating the bed at the head end to avoid drop of blood pressure when getting up. Walk always in a well-lit room (use night lights in the walls). Avoid area rugs or power cords from appliances in the middle of the walkways. Use a walker or a cane if necessary and consider physical therapy for balance exercise. Get your eyesight checked regularly.  FINANCIAL OVERSIGHT: Supervision, especially oversight when making financial decisions or transactions is also recommended.  HOME SAFETY: Consider the safety of the kitchen when operating appliances like stoves, microwave oven, and blender. Consider having supervision and share cooking responsibilities until no longer able to participate in those. Accidents with firearms and other hazards in the house should be identified and addressed as well.  DRIVING: Regarding driving, in patients with progressive memory problems, driving will be impaired. We advise to have someone else do the driving if trouble finding directions or if minor accidents are reported. Independent driving assessment is available to determine safety of driving.  ABILITY TO BE LEFT ALONE: If patient is unable to contact 911 operator, consider using LifeLine, or when the need is there, arrange for someone to stay with patients. Smoking is a fire hazard, consider supervision or cessation. Risk of wandering should be assessed by caregiver and if detected at any point, supervision and safe proof recommendations should be instituted.  MEDICATION SUPERVISION: Inability to self-administer medication needs to be constantly addressed.  Implement a mechanism to ensure safe administration of the medications.  RECOMMENDATIONS FOR ALL PATIENTS WITH MEMORY PROBLEMS: 1. Continue to exercise (Recommend 30 minutes of walking everyday, or 3 hours every week) 2. Increase social interactions - continue going to [place] and enjoy social gatherings with friends and family 3. Eat healthy, avoid fried foods and eat more fruits and vegetables 4. Maintain adequate blood pressure, blood sugar, and blood cholesterol level. Reducing the risk of stroke and cardiovascular disease also helps promoting better memory. 5. Avoid stressful situations. Live a simple life and avoid aggravations. Organize your time and prepare for the next day in anticipation. 6. Sleep well, avoid any interruptions of sleep and avoid any distractions in the bedroom that may interfere with adequate sleep quality 7. Avoid sugar, avoid sweets as there is a strong link between excessive sugar intake, diabetes, and cognitive impairment The Mediterranean diet has been shown to help patients reduce the risk of progressive memory disorders and reduces cardiovascular risk. This includes eating fish, eat fruits and green leafy vegetables, nuts like almonds and hazelnuts, walnuts, and also use olive oil. Avoid fast foods and fried foods as much as possible. Avoid sweets and sugar as sugar use has been linked to worsening of memory function.  There is always a concern of gradual progression of memory problems. If this is the case, then we may need to adjust level of care according to patient needs. Support, both to the patient and caregiver, should then be put into place.

## 2024-01-07 NOTE — Progress Notes (Signed)
 NEUROLOGY FOLLOW UP OFFICE NOTE  Alejandro Koch [id number] 09-Mar-1951  HISTORY OF PRESENT ILLNESS: I had the pleasure of seeing Alejandro Koch in follow-up in the neurology clinic on 01/07/2024.  The patient was last seen 8 month ago for memory loss and dizziness. He is again accompanied by his wife Alejandro Koch who helps supplement the history today.  Records and images were personally reviewed where available.  Since his last visit, they both report that memory has been stable. He continues to drive without getting lost. They fix their pillboxes together and his wife makes sure he does not miss any doses. Most bills are on autopay. He is independent with dressing and bathing. Dizziness comes and goes, it is not bad. He fell recently walking his dog, injuring the left shoulder. He has seen Ortho and has an MRI scheduled.   History on Initial Assessment 05/15/2020: This is a 73 year old right-handed man with a history of hypertension, hyperlipidemia, OSA, diabetes, anxiety, depression, presenting for evaluation of memory loss. He feels hie memory is not like it's supposed to be, he used to be pretty sharp and great with numbers. He lives with his wife. His wife started noticing changes about a year ago, he started having mood changes, a lot of edgy when he is usually even-keel. He was losing things frequently and appetite was not like it was. He was losing weight and reporting dizziness. He would repeat himself and get frustrated easily. She noticed his attention span would not be longer than 10-15 minutes, he would get easily distracted. 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 He has occasional word-finding difficulties when he used to be pretty accurate. He denies getting lost driving but has trouble remembering directions to the beach and questions himself more now. He denies missing medications or bill payments. His mother had Alzheimer's disease, his father had  memory issues. He rarely drinks alcohol. He had a fall last Christmas where he lost consciousness. No neurosurgical procedures. He thinks his mood is better, he was in a very stressful work environment but did notice he used to be more tolerant but is now more irritable. His wife denies any paranoia or hallucinations.  He states he never had headaches in the past, but over the last 3 months he has had a low grade frontal headache that would occur infrequently. No associated nausea/vomiting. He has dizziness when sitting but more when standing. He describes a spinning sensation where he has to hold on for a few seconds to get his bearings, no falls. He denies any diplopia, dysarthria/dysphagia, neck/back pain, focal numbness/tingling/weakness, bowel/bladder dysfunction, anosmia, or tremors. Sleep is good with Xanax  every night.   Diagnostic Data: MRI brain without contrast done 02/2020 did not show any acute changes, there was mild diffuse atrophy.  Repeat brain MRI without contrast done 02/2022 no acute changes, there was mild diffuse atrophy, minimal chronic microvascular disease, right maxillary sinusitis.   MRA head and neck on 03/24/22 showed focal severe proximal left P2 stenosis, otherwise patent distally, short-segment mild moderate distal right P2 stenosis, patent to distal aspects. There was a dominant left vertebral artery widely patent to the vertebrobasilar junction, hypoplastic right vertebral artery, both PICA patent. Basilar patent to distal aspect without stenosis.   Neuropsychological testing in 05/2020: diagnosis of Mild Neurocognitive Disorder, etiology unclear. Findings were not consistent with Alzheimer's disease, most likely culprit felt to be a combination of sleep dysfunction, Xanax  side effects, and cardiovascular  disease, various psychosocial stressors.  Repeat Neuropsychological testing on 06/2022: diagnosis of Mild Neurocognitive Disorder. Primary impairments were with executive  functioning and both encoding and delayed retrieval aspects of memory. Compared to prior testing in March 2022, there was some cognitive decline observed. Etiology remains unclear, decline raises concern for early stages of a neurodegenerative illness.  Laboratory Data: Lab Results  Component Value Date   TSH 2.24 06/09/2017   Lab Results  Component Value Date   VITAMINB12 552 08/29/2020   PAST MEDICAL HISTORY: Past Medical History:  Diagnosis Date   Abdominal discomfort, epigastric 06/10/2017   Actinic keratoses 05/12/2014   Acute chest pain 12/20/2007   Acute prostatitis 06/10/2017   Classic presentation; will treat with sulfa  x 2 weeks. He did well with this back in March   Allergic rhinitis 09/30/2006   Anemia    Benign essential hypertension 09/30/2006   BP Readings from Last 3 Encounters: 06/16/17 138/72  06/09/17 118/66  12/09/16 136/82   Reasonable control   Blood transfusion without reported diagnosis    40 yrs ago    BPH with obstruction/lower urinary tract symptoms 12/27/2018   Still with mild symptoms. If worsens, will need to go back to the urologist   CHF (congestive heart failure) (HCC)    Chronic cough 07/16/2018   Fits definition of chronic bronchitis except not 2 years in a row. Seeing the pulmonary doctor. Doesn't feel the breo has helped   Disequilibrium 06/20/2022   Extensor tendon laceration of right hand with open wound 09/16/2016   Fatigue 06/10/2017   Fatty liver 03/01/2010   LFTs normal. Still needs more weight loss   GERD (gastroesophageal reflux disease) 09/30/2006   Tried omeprazole and symptoms all came back. Will order the nexium  again   Glaucoma    Hyperlipemia 09/30/2006   Inconsistent with the atorvastatin . Discussed---will take it twice a week   Hyperlipidemia    Hypogonadism in male 09/19/2009   Kidney stones    Laryngopharyngeal reflux (LPR) 06/03/2016   Left foot pain 09/20/2019   Major depressive disorder    Mild neurocognitive  disorder 05/18/2020   OSA (obstructive sleep apnea) 09/30/2006   NPSG 07/2014:  AHI 23/hr, PLMS 452 with 12/hr with A/A NPSG 07/2014:  AHI 23/hr, PLMS 452 with 12/hr with A/A. The patient has moderate obstructive sleep apnea by his recent study, and is very symptomatic during the day. I have discussed with him the various treatment options, and have recommended a trial of C P   Sensorineural hearing loss (SNHL) of both ears 06/20/2022   Shortness of breath 05/21/2015   09/11/17 Right and Left Heart cath: Right radial and brachial access.Left dominant coronary system with no CAD RHC with some exercise showed RA 15, 14 mm Hg (mean 12)PA 37/14  mean 27,PWP 17,15 mean 16   Skin lesion 11/06/2021   Tinnitus of both ears 06/20/2022   Type 2 DM with diabetic neuropathy affecting both sides of body (HCC) 03/05/2009   HGBA1C 8.7 (A) 03/30/2018. Not much better. I want to send him to endocrine--he wants to hold off (doesn't want insulin, etc) Needs formal help---like Weight [employer] Will give him 3 months more   Vitamin B12 deficiency 06/16/2017   Does take supplement daily. Will check with full labs at next visit    MEDICATIONS: Current Outpatient Medications on File Prior to Visit  Medication Sig Dispense Refill   ALPRAZolam  (XANAX ) 0.5 MG tablet TAKE 1 TABLET BY MOUTH THREE TIMES A DAY AS NEEDED 90  tablet 0   aspirin 81 MG chewable tablet Chew by mouth.     Blood Glucose Calibration (ONETOUCH VERIO) SOLN AS DIRECTED 1 each 1   Blood Glucose Monitoring Suppl (ONETOUCH VERIO FLEX SYSTEM) w/Device KIT Use as directed,once daily to test blood sugar 1 kit 0   esomeprazole  (NEXIUM ) 20 MG capsule Take 40 mg by mouth 2 (two) times daily before a meal.     FARXIGA  10 MG TABS tablet TAKE 1 TABLET BY MOUTH DAILY BEFORE BREAKFAST. 90 tablet 3   finasteride  (PROSCAR ) 5 MG tablet TAKE 1 TABLET BY MOUTH EVERY DAY 90 tablet 3   glipiZIDE  (GLUCOTROL  XL) 10 MG 24 hr tablet TAKE 1 TABLET (10 MG TOTAL) BY MOUTH DAILY WITH  BREAKFAST. 90 tablet 3   glucose blood (ONETOUCH VERIO) test strip CHECK BLOOD SUGAR EVERY DAY 50 strip 11   HYDROcodone -acetaminophen  (NORCO/VICODIN) 5-325 MG tablet Take 1 tablet by mouth 3 (three) times daily as needed for moderate pain (pain score 4-6) (sedation caution). 15 tablet 0   hydrocortisone  2.5 % cream APPLY TO AFFECTED AREA 3 TIMES A DAY AS NEEDED 28 g 3   ketorolac (ACULAR) 0.5 % ophthalmic solution USE AS DIRECTED 5 mL 1   metFORMIN  (GLUCOPHAGE -XR) 500 MG 24 hr tablet TAKE 2 TABLETS (1,000 MG TOTAL) BY MOUTH IN THE MORNING AND AT BEDTIME. 360 tablet 3   Meth-Hyo-M Bl-Na Phos-Ph Sal (URO-MP) 118 MG CAPS Take 1 capsule by mouth every 6 (six) hours as needed.     nitroGLYCERIN (NITROSTAT) 0.4 MG SL tablet Place under the tongue.     ONETOUCH DELICA LANCETS FINE MISC 1 Units by Does not apply route daily. 100 each 2   rosuvastatin  (CRESTOR ) 10 MG tablet TAKE 1 TABLET BY MOUTH EVERY DAY 90 tablet 3   tamsulosin  (FLOMAX ) 0.4 MG CAPS capsule TAKE 1 CAPSULE BY MOUTH EVERY DAY AFTER SUPPER 90 capsule 0   urea  (CARMOL) 40 % CREA APPLY TOPICALLY 3 TIMES A DAY 28.35 g 1   vitamin B-12 (CYANOCOBALAMIN ) 1000 MCG tablet Take 1 tablet (1,000 mcg total) by mouth daily.     No current facility-administered medications on file prior to visit.    ALLERGIES: Allergies  Allergen Reactions   Iohexol      Code: HIVES, Desc: PT IS ALLERGIC TO IVP DYE AS NOTED IN PREVIOUS RECORDS IN PACS 02/2009/RM, Onset Date: [id number]    Atorvastatin  Other (See Comments)    Myalgias with this and rosuvastin    FAMILY HISTORY: Family History  Problem Relation Age of Onset   Alzheimer's disease Mother    Hypertension Mother    Heart disease Father    COPD Father    Colon cancer Father 34   Prostate cancer Father    Dementia Father    Colon polyps Brother    Alzheimer's disease Maternal Grandmother    Leukemia Brother    Leukemia Other    Diabetes Son    Stomach cancer Neg Hx    Esophageal cancer Neg  Hx    Rectal cancer Neg Hx     SOCIAL HISTORY: Social History   Socioeconomic History   Marital status: Married    Spouse name: Not on file   Number of children: 1   Years of education: 14   Highest education level: Associate degree: academic program  Occupational History   Occupation: Retired    Comment: EMS, Network engineer  Tobacco Use   Smoking status: Former    Current packs/day: 0.00  Average packs/day: 2.5 packs/day for 7.0 years (17.5 ttl pk-yrs)    Types: Cigarettes    Start date: 03/17/1976    Quit date: 03/18/1983    Years since quitting: 40.8    Passive exposure: Past   Smokeless tobacco: Never  Vaping Use   Vaping status: Never Used  Substance and Sexual Activity   Alcohol use: Not Currently    Comment: occasional glass of wine   Drug use: No   Sexual activity: Not on file  Other Topics Concern   Not on file  Social History Narrative   No living will or health care POA thus far   Wife, then son, should make decisions. May make son the primary person   Would accept resuscitation attempts but wouldn't want to be on machines   No tube feeds if cognitively unaware   Right handed   Social Drivers of Health   Financial Resource Strain: Low Risk  (09/16/2023)   Overall Financial Resource Strain (CARDIA)    Difficulty of Paying Living Expenses: Not hard at all  Food Insecurity: No Food Insecurity (09/16/2023)   Hunger Vital Sign    Worried About Running Out of Food in the Last Year: Never true    Ran Out of Food in the Last Year: Never true  Transportation Needs: No Transportation Needs (09/16/2023)   PRAPARE - Administrator, Civil Service (Medical): No    Lack of Transportation (Non-Medical): No  Physical Activity: Insufficiently Active (09/16/2023)   Exercise Vital Sign    Days of Exercise per Week: 7 days    Minutes of Exercise per Session: 20 min  Stress: No Stress Concern Present (09/16/2023)   [employer] of Occupational Health - Occupational  Stress Questionnaire    Feeling of Stress: Only a little  Recent Concern: Stress - Stress Concern Present (07/30/2023)   [employer] of Occupational Health - Occupational Stress Questionnaire    Feeling of Stress : To some extent  Social Connections: Socially Integrated (09/16/2023)   Social Connection and Isolation Panel    Frequency of Communication with Friends and Family: More than three times a week    Frequency of Social Gatherings with Friends and Family: Twice a week    Attends Religious Services: More than 4 times per year    Active Member of [employer] or Organizations: Yes    Attends Engineer, structural: More than 4 times per year    Marital Status: Married  Catering manager Violence: Not At Risk (07/31/2023)   Humiliation, Afraid, Rape, and Kick questionnaire    Fear of Current or Ex-Partner: No    Emotionally Abused: No    Physically Abused: No    Sexually Abused: No     PHYSICAL EXAM: Vitals:   01/07/24 1220  BP: 138/72  Pulse: 81  SpO2: 98%   General: No acute distress Head:  Normocephalic/atraumatic Skin/Extremities: No rash, no edema Neurological Exam: alert and oriented to person, place, and time. No aphasia or dysarthria. Fund of knowledge is appropriate.  Recent memory impaired, 0/3 delayed recall. Attention and concentration are normal, 5/5 WORLD backwards.    IMPRESSION: This is a 73 yo RH man with a history of hypertension, hyperlipidemia, OSA, diabetes, anxiety, depression, with dizziness and Mild Neurocognitive disorder.  Mild Cognitive Impairment. Repeat Neuropsychological testing on 06/2022 again with a diagnosis of Mild Neurocognitive Disorder.Compared to prior testing in March 2022, there was some cognitive decline observed. Etiology remains unclear, decline raises concern for early stages  of a neurodegenerative illness. He has declined Donepezil  due to concern about side effects. He had also declined CSF testing for AD biomarkers, he agrees to  do Lumipulse blood test. If positive, we agreed to [facility] ([facility]) referral for consideration for Lecanemab or Donanemab.  2. Dizziness. Etiology unclear, MRI brain no acute changes. MRA head and neck showed focal stenosis in bilateral P2 segments proximally but flow was seen distally. There was no vertebrobasilar insufficiency seen. P2 stenosis would not typically cause dizziness. He reports dizziness does not occur as frequently. If symptoms worsen, he knows next stop is referral to tertiary center for second opinion.   Thank you for allowing me to participate in his care.  Please do not hesitate to call for any questions or concerns.    Darice Shivers, M.D.   CC: Dr. Rilla

## 2024-01-08 ENCOUNTER — Ambulatory Visit: Payer: Medicare HMO | Admitting: Neurology

## 2024-01-13 ENCOUNTER — Encounter (HOSPITAL_COMMUNITY): Payer: Self-pay

## 2024-01-13 ENCOUNTER — Other Ambulatory Visit: Payer: Self-pay

## 2024-01-13 ENCOUNTER — Telehealth: Payer: Self-pay | Admitting: Neurology

## 2024-01-13 ENCOUNTER — Encounter: Payer: Self-pay | Admitting: Neurology

## 2024-01-13 ENCOUNTER — Emergency Department (HOSPITAL_COMMUNITY): Admission: EM | Admit: 2024-01-13 | Discharge: 2024-01-13 | Disposition: A | Discharge status: Home / Self Care

## 2024-01-13 DIAGNOSIS — G510 Bell's palsy: Secondary | ICD-10-CM | POA: Insufficient documentation

## 2024-01-13 DIAGNOSIS — Z7982 Long term (current) use of aspirin: Secondary | ICD-10-CM | POA: Diagnosis not present

## 2024-01-13 DIAGNOSIS — Z79899 Other long term (current) drug therapy: Secondary | ICD-10-CM | POA: Insufficient documentation

## 2024-01-13 DIAGNOSIS — R29810 Facial weakness: Secondary | ICD-10-CM | POA: Diagnosis not present

## 2024-01-13 MED ORDER — VALACYCLOVIR HCL 1 G PO TABS: 1000.0000 mg | ORAL_TABLET | Freq: Three times a day (TID) | ORAL | 0 refills | Status: AC

## 2024-01-13 MED ORDER — PREDNISONE 10 MG PO TABS: 60.0000 mg | ORAL_TABLET | Freq: Every day | ORAL | 0 refills | Status: AC

## 2024-01-13 NOTE — Telephone Encounter (Signed)
Pt wife called back no answer left a voice mail to call the office back  

## 2024-01-13 NOTE — ED Provider Notes (Signed)
 Alejandro Koch Provider Note   CSN: [id number] Arrival date & time: 01/13/24  1128     Patient presents with: Facial Droop   Alejandro Koch is a 73 y.o. male.   73 year old male presents for evaluation of right-sided facial droop.  Wife states that started yesterday.  She states he is having trouble blinking.  Patient states he is dizzy but this is not new and he always feels like this is he deals with vertigo.  States has been going on for a few months.  He denies any new numbness or tingling in his arms or legs, focal weakness or trouble with balance.        Prior to Admission medications   Medication Sig Start Date End Date Taking? Authorizing Provider  predniSONE  (DELTASONE ) 10 MG tablet Take 6 tablets (60 mg total) by mouth daily for 5 days. 01/13/24 01/18/24 Yes Jaivyn Gulla L, DO  valACYclovir (VALTREX) 1000 MG tablet Take 1 tablet (1,000 mg total) by mouth 3 (three) times daily for 7 days. 01/13/24 01/20/24 Yes Amorette Charrette L, DO  ALPRAZolam  (XANAX ) 0.5 MG tablet TAKE 1 TABLET BY MOUTH THREE TIMES A DAY AS NEEDED 05/02/21   Jimmy Charlie FERNS, MD  aspirin 81 MG chewable tablet Chew by mouth. 09/09/16   [provider]  Blood Glucose Calibration (ONETOUCH VERIO) SOLN AS DIRECTED 09/06/21   Jimmy Charlie FERNS, MD  Blood Glucose Monitoring Suppl (ONETOUCH VERIO FLEX SYSTEM) w/Device KIT Use as directed,once daily to test blood sugar 09/06/21   Jimmy Charlie FERNS, MD  esomeprazole  (NEXIUM ) 20 MG capsule Take 40 mg by mouth 2 (two) times daily before a meal.    [provider]  FARXIGA  10 MG TABS tablet TAKE 1 TABLET BY MOUTH DAILY BEFORE BREAKFAST. 07/06/23   Jimmy Charlie I, MD  finasteride  (PROSCAR ) 5 MG tablet TAKE 1 TABLET BY MOUTH EVERY DAY 10/02/23   Jimmy Charlie FERNS, MD  glipiZIDE  (GLUCOTROL  XL) 10 MG 24 hr tablet TAKE 1 TABLET (10 MG TOTAL) BY MOUTH DAILY WITH BREAKFAST. 03/02/23   Jimmy Charlie FERNS, MD  glucose  blood (ONETOUCH VERIO) test strip CHECK BLOOD SUGAR EVERY DAY 04/10/23   Jimmy Charlie FERNS, MD  HYDROcodone -acetaminophen  (NORCO/VICODIN) 5-325 MG tablet Take 1 tablet by mouth 3 (three) times daily as needed for moderate pain (pain score 4-6) (sedation caution). 12/25/23   Cleatus Arlyss RAMAN, MD  hydrocortisone  2.5 % cream APPLY TO AFFECTED AREA 3 TIMES A DAY AS NEEDED 05/06/23   Jimmy Charlie FERNS, MD  ketorolac (ACULAR) 0.5 % ophthalmic solution USE AS DIRECTED 06/27/13   Mavis Norleen BRAVO, MD  metFORMIN  (GLUCOPHAGE -XR) 500 MG 24 hr tablet TAKE 2 TABLETS (1,000 MG TOTAL) BY MOUTH IN THE MORNING AND AT BEDTIME. 07/10/23   Letvak, Richard I, MD  Meth-Hyo-M Bl-Na Phos-Ph Sal (URO-MP) 118 MG CAPS Take 1 capsule by mouth every 6 (six) hours as needed. 05/09/16   [provider]  nitroGLYCERIN (NITROSTAT) 0.4 MG SL tablet Place under the tongue. 06/25/17 01/07/24  [provider]  AISHA PASTOR LANCETS FINE MISC 1 Units by Does not apply route daily. 07/26/15   Jimmy Charlie FERNS, MD  rosuvastatin  (CRESTOR ) 10 MG tablet TAKE 1 TABLET BY MOUTH EVERY DAY 02/23/23   Jimmy Charlie I, MD  tamsulosin  (FLOMAX ) 0.4 MG CAPS capsule TAKE 1 CAPSULE BY MOUTH EVERY DAY AFTER SUPPER 09/09/23   Jimmy Charlie I, MD  urea  (CARMOL) 40 % CREA APPLY TOPICALLY 3  TIMES A DAY 11/03/22   Jimmy Charlie FERNS, MD  vitamin B-12 (CYANOCOBALAMIN ) 1000 MCG tablet Take 1 tablet (1,000 mcg total) by mouth daily. 06/10/17   Rilla Baller, MD    Allergies: Iohexol and Atorvastatin     Review of Systems  Constitutional:  Negative for chills and fever.  HENT:  Negative for ear pain and sore throat.   Eyes:  Negative for pain and visual disturbance.  Respiratory:  Negative for cough and shortness of breath.   Cardiovascular:  Negative for chest pain and palpitations.  Gastrointestinal:  Negative for abdominal pain and vomiting.  Genitourinary:  Negative for dysuria and hematuria.  Musculoskeletal:  Negative for arthralgias  and back pain.  Skin:  Negative for color change and rash.  Neurological:  Positive for facial asymmetry. Negative for seizures and syncope.  All other systems reviewed and are negative.   Updated Vital Signs BP (!) 169/89 (BP Location: Right Arm)   Pulse (!) 110   Temp 98.1 F (36.7 C) (Oral)   Resp 14   Ht 6' 5 (1.956 m)   Wt 101.1 kg   SpO2 98%   BMI 26.44 kg/m   Physical Exam Vitals and nursing note reviewed.  Constitutional:      General: He is not in acute distress.    Appearance: Normal appearance. He is well-developed. He is not ill-appearing.  HENT:     Head: Normocephalic and atraumatic.  Eyes:     Conjunctiva/sclera: Conjunctivae normal.  Cardiovascular:     Rate and Rhythm: Normal rate and regular rhythm.     Heart sounds: No murmur heard. Pulmonary:     Effort: Pulmonary effort is normal. No respiratory distress.     Breath sounds: Normal breath sounds.  Abdominal:     Palpations: Abdomen is soft.     Tenderness: There is no abdominal tenderness.  Musculoskeletal:        General: No swelling.     Cervical back: Neck supple.  Skin:    General: Skin is warm and dry.     Capillary Refill: Capillary refill takes less than 2 seconds.  Neurological:     Mental Status: He is alert.     Comments: Patient with difficulty blinking, limited forehead movement and facial droop on the right.  He has very very mild sensation deficit on the right.  This is all consistent with Bell's palsy, otherwise exam is nonfocal and he has full strength in bilateral upper and lower extremities, no ataxia and sensation is intact  Psychiatric:        Mood and Affect: Mood normal.     (all labs ordered are listed, but only abnormal results are displayed) Labs Reviewed - No data to display  EKG: None  Radiology: No results found.   Procedures   Medications Ordered in the ED - No data to display                                  Medical Decision Making Patient here for  right facial droop consistent with Bell's palsy.  He has difficulty blinking and forehead involvement.  I do not think he is having a stroke at this time.  I will start him on acyclovir and steroids and he is advised close follow-up with his neurologist.  They were given strict return precautions and advised to return for any new or worsening symptoms.  Patient is wife feel comfortable  to plan to be discharged home.  Problems Addressed: Bell's palsy: acute illness or injury  Amount and/or Complexity of Data Reviewed External Data Reviewed: notes.    Details: Prior outpatient records reviewed and patient follows with neurology for cognitive decline  Risk OTC drugs. Prescription drug management.     Final diagnoses:  Bell's palsy    ED Discharge Orders          Ordered    predniSONE  (DELTASONE ) 10 MG tablet  Daily        01/13/24 1213    valACYclovir (VALTREX) 1000 MG tablet  3 times daily        01/13/24 1213               Ronit Cranfield, Amanda L, DO 01/13/24 1216

## 2024-01-13 NOTE — Telephone Encounter (Signed)
 Who's calling (name and relationship to patient) : Zaid Tomes; wife  Best contact number: [phone]  Provider they see: Dr. Georjean  Reason for call: Adrien called in wanting  to schedule an appt with DR. Aquino to check him. She stated that  yesterday his left eye would blink, but the right eye would not when he tried to wink. Marcey also stated to her that there was a sensation on the right side of his head.  Adrien is a requesting call back.

## 2024-01-13 NOTE — Telephone Encounter (Signed)
 I don't have any openings. Are symptoms better? If not, he may need to go to ER for evaluation and imaging.

## 2024-01-13 NOTE — ED Triage Notes (Signed)
 Patient arrives with wife, wife provides most of the history of symptoms. Wife reports facial droop, mouth changes, and non-blinking R eye since yesterday. Patient is A&O to baseline, hx of dementia. Sensation loss is isolated to face, patient is unable to lift eye brows, reports no difficulty speaking or finding words. Provider in triage for assessment.

## 2024-01-13 NOTE — Discharge Instructions (Signed)
 Take your steroids and antivirals as prescribed.  The steroids can make your blood glucose go up please keep a close eye on this.  Follow-up with neurology and primary care in 1 to 2 weeks.

## 2024-01-14 NOTE — Telephone Encounter (Signed)
 Pt wife sent a mychart he went to the ER he was DX with cerebral palsy

## 2024-01-15 ENCOUNTER — Telehealth: Payer: Self-pay | Admitting: Neurology

## 2024-01-15 ENCOUNTER — Ambulatory Visit: Payer: Self-pay

## 2024-01-15 NOTE — Telephone Encounter (Signed)
 Noted

## 2024-01-15 NOTE — Telephone Encounter (Signed)
 Who's calling (name and relationship to patient) : Bonner Gaskins; spouse  Best contact number: [phone]  Provider they see: Dr. Georjean  Reason for call: Debra lvm stating that Marcey was diagnosed with Carolee Goodie, and was told to call if there were any changes.  She has requested a call back.

## 2024-01-15 NOTE — Telephone Encounter (Signed)
 Pain behind the ear, headache, changes in taste can occur with Bell's palsy. If he starts having severe headache or numbness/weakness in the arms or legs, would to back to the ER. Otherwise, continue on the course of steroid and antiviral prescribed in the ER

## 2024-01-15 NOTE — Telephone Encounter (Signed)
 Patient wife would like to know if pain in the ear going down the neck on the right side and a headache is a sign an symptom of bells palsy she is asking what do they need to be looking out for,

## 2024-01-15 NOTE — Telephone Encounter (Signed)
 Pt called an informed Pain behind the ear, headache, changes in taste can occur with Bell's palsy. If he starts having severe headache or numbness/weakness in the arms or legs, would to back to the ER. Otherwise, continue on the course of steroid and antiviral prescribed in the ER

## 2024-01-15 NOTE — Telephone Encounter (Signed)
Pt wife called back no answer left a voice mail to call back

## 2024-01-15 NOTE — Telephone Encounter (Signed)
Pt wife called no answer left a voice mail to call the office back

## 2024-01-15 NOTE — Telephone Encounter (Signed)
 Copied from CRM #[id number]. Topic: Clinical - Red Word Triage >> Jan 15, 2024 11:53 AM Suzen RAMAN wrote: Red Word that prompted transfer to Nurse Triage: Ear pain due to recent dx of Cerebral palsy. Patient wife on the line requesting an appt. Reason for Disposition  [1] Age > 50 AND [2] no history of prior similar neck pain  Answer Assessment - Initial Assessment Questions PT was recently diagnosed with bells palsy today woke up with a slight headache which is now subsided but also ear pain. Mild pain behind his ear that goes down his neck a little bit. Wife and pt just want to make sure this was normal part of the process. Rn advised it is very common for ear pain with bells palsy. Rn did give instructions on when to take him back to ER or UC for re-evaluation.    1. ONSET: When did the pain begin?      This morning 2. LOCATION: Where does it hurt?      Right ear into neck 3. PATTERN Does the pain come and go, or has it been constant since it started?      intermittent 4. SEVERITY: How bad is the pain?  (Scale 0-10; or none or slight stiffness, mild, moderate, severe)     Mild 2-3 5. RADIATION: Does the pain go anywhere else, shoot into your arms?     From behind ear to neck,  6. CORD SYMPTOMS: Any weakness or numbness of the arms or legs?     no 7. CAUSE: What do you think is causing the neck pain?     unknown  9. OTHER SYMPTOMS: Do you have any other symptoms? (e.g., headache, fever, chest pain, difficulty breathing, neck swelling)     Headache when he woke up  Protocols used: Neck Pain or Stiffness-A-AH

## 2024-01-16 DIAGNOSIS — M25512 Pain in left shoulder: Secondary | ICD-10-CM | POA: Diagnosis not present

## 2024-01-18 NOTE — Telephone Encounter (Signed)
 Noted. Thanks.

## 2024-01-22 DIAGNOSIS — M25512 Pain in left shoulder: Secondary | ICD-10-CM | POA: Diagnosis not present

## 2024-02-03 ENCOUNTER — Encounter: Payer: Self-pay | Admitting: Neurology

## 2024-02-23 ENCOUNTER — Encounter: Payer: Self-pay | Admitting: *Deleted

## 2024-03-04 DIAGNOSIS — M25512 Pain in left shoulder: Secondary | ICD-10-CM | POA: Diagnosis not present

## 2024-03-15 ENCOUNTER — Ambulatory Visit: Payer: Self-pay

## 2024-03-15 ENCOUNTER — Ambulatory Visit (INDEPENDENT_AMBULATORY_CARE_PROVIDER_SITE_OTHER)

## 2024-03-15 VITALS — BP 138/80 | HR 74 | Temp 98.0°F | Ht 77.0 in | Wt 222.0 lb

## 2024-03-15 DIAGNOSIS — B9789 Other viral agents as the cause of diseases classified elsewhere: Secondary | ICD-10-CM

## 2024-03-15 DIAGNOSIS — J329 Chronic sinusitis, unspecified: Secondary | ICD-10-CM | POA: Diagnosis not present

## 2024-03-15 DIAGNOSIS — R051 Acute cough: Secondary | ICD-10-CM | POA: Diagnosis not present

## 2024-03-15 LAB — POCT INFLUENZA A/B
Influenza A, POC: NEGATIVE
Influenza B, POC: NEGATIVE

## 2024-03-15 LAB — POC COVID19 BINAXNOW: SARS Coronavirus 2 Ag: NEGATIVE

## 2024-03-15 MED ORDER — BENZONATATE 200 MG PO CAPS: 200.0000 mg | ORAL_CAPSULE | Freq: Three times a day (TID) | ORAL | 0 refills | Status: AC | PRN

## 2024-03-15 MED ORDER — PROMETHAZINE-DM 6.25-15 MG/5ML PO SYRP: 5.0000 mL | ORAL_SOLUTION | Freq: Every evening | ORAL | 0 refills | Status: AC

## 2024-03-15 MED ORDER — OXYMETAZOLINE HCL 0.05 % NA SOLN: 1.0000 | Freq: Two times a day (BID) | NASAL | 0 refills | Status: AC

## 2024-03-15 MED ORDER — IBUPROFEN 600 MG PO TABS: 600.0000 mg | ORAL_TABLET | Freq: Three times a day (TID) | ORAL | 0 refills | Status: AC | PRN

## 2024-03-15 NOTE — Patient Instructions (Signed)
 Thank you for visiting [facility] today! Here's what we talked about: - START ibuprofen for body aches, tessalon  for cough during the day, Promethazine syrup at night only, Afrin for nasal congestion - STOP Aspirin for the next few days - Call clinic if unimproved in 7 days

## 2024-03-15 NOTE — Telephone Encounter (Signed)
 FYI Only or Action Required?: FYI only for provider: appointment scheduled on 03/15/24.  Patient was last seen in primary care on 12/25/2023 by Cleatus Arlyss RAMAN, MD.  Called Nurse Triage reporting Cough.  Symptoms began yesterday.  Interventions attempted: Nothing.  Symptoms are: unchanged.  Triage Disposition: See Physician Within 24 Hours  Patient/caregiver understands and will follow disposition?: Yes   Copied from CRM #[id number]. Topic: Clinical - Red Word Triage >> Mar 15, 2024  9:47 AM Jayma L wrote: Red Word that prompted transfer to Nurse Triage: wife calling  Debra  - coughing a lot and congestion Reason for Disposition  SEVERE coughing spells (e.g., whooping sound after coughing, vomiting after coughing)  Answer Assessment - Initial Assessment Questions Scheduled 03/15/24 Advised call back or ED/911 if symptoms worsen. Patient's wife verbalized understanding.    1. ONSET: When did the cough begin?      yesterday 2. SEVERITY: How bad is the cough today?      moderate 3. SPUTUM: Describe the color of your sputum (e.g., none, dry cough; clear, white, yellow, green)     no 4. HEMOPTYSIS: Are you coughing up any blood? If Yes, ask: How much? (e.g., flecks, streaks, tablespoons, etc.)     no 5. DIFFICULTY BREATHING: Are you having difficulty breathing? If Yes, ask: How bad is it? (e.g., mild, moderate, severe)      no 6. FEVER: Do you have a fever? If Yes, ask: What is your temperature, how was it measured, and when did it start?     denies 7. CARDIAC HISTORY: Do you have any history of heart disease? (e.g., heart attack, congestive heart failure)      no 8. LUNG HISTORY: Do you have any history of lung disease?  (e.g., pulmonary embolus, asthma, emphysema)     no 9. PE RISK FACTORS: Do you have a history of blood clots? (or: recent major surgery, recent prolonged travel, bedridden)     no 10. OTHER SYMPTOMS: Do you have any other symptoms?  (e.g., runny nose, wheezing, chest pain)   Runny nose(clear) , hoarse   denies sore throat, diff breathing, chest pain  12. TRAVEL: Have you traveled out of the country in the last month? (e.g., travel history, exposures)       Sick exposure  Protocols used: Cough - Acute Non-Productive-A-AH

## 2024-03-15 NOTE — Progress Notes (Signed)
 "  Subjective:   This visit was conducted in person. The patient gave informed consent to the use of Abridge AI technology to record the contents of the encounter as documented below.   Patient ID: Alejandro Koch, male    DOB: 1950/12/14, 73 y.o.   MRN: [id number]   Discussed the use of AI scribe software for clinical note transcription with the patient, who gave verbal consent to proceed.  History of Present Illness Alejandro Koch is a 73 year old male who presents with fever, sinus congestion, and headache.  He has been experiencing fever, sinus congestion, and headache for the past two days, starting over the weekend. He has not been around anyone known to be sick, although he attended church recently. His symptoms include a headache and body aches, with a fever that he did not measure today. He feels warm and possibly has chills, as he kept a blanket around himself. No sore throat, and he reports that he is not coughing anything up.  The cough is worse at night when he lies down, and he is also disturbed by sniffing, a runny nose, and headache. He describes his nasal discharge as 'dark green' and thick, which is unusual for him as it is usually clear. No ear pain, but he mentions having ringing in his ears, which is a chronic issue due to his hearing deficit. He wears hearing aids but forgot to put them in today.  He has been taking ibuprofen, aspirin, and a decongestant for his symptoms. He takes aspirin daily, which he believes is for his heart, although he has never had a heart attack or stroke. He recalls having chest pain in the past, which was not a heart attack, and was treated with nitroglycerin. His cough worsens at night when he lies down, causing him to get up and cough after lying in bed for a while. No chest pain or difficulty breathing during these episodes.   Review of Systems  All other systems reviewed and are negative.       Allergies[1]  Medications Ordered  Prior to Encounter[2]  BP 138/80 (BP Location: Left Arm, Patient Position: Sitting, Cuff Size: Large)   Pulse 74   Temp 98 F (36.7 C) (Oral)   Ht 6' 5 (1.956 m)   Wt 222 lb (100.7 kg)   SpO2 96%   BMI 26.33 kg/m   Objective:      Physical Exam GENERAL: Alert, cooperative, well developed, no acute distress. HEAD: Normocephalic atraumatic. EYES: Extraocular movements intact BL, pupils round, equal and reactive to light BL, conjunctivae normal BL. EARS: Tympanic membrane, ear canal and external ear normal BL. NOSE: Mild rhinorrhea, mucous membranes are moist. THROAT: No oropharyngeal exudate or posterior oropharyngeal erythema. No frontal or maxillary sinus tenderness bilaterally.  CARDIOVASCULAR: Normal heart rate and rhythm, S1 and S2 normal without murmurs. CHEST: Clear to auscultation bilaterally, no wheezes, rhonchi, or crackles.         Assessment & Plan:   Assessment & Plan Acute viral sinusitis with cough Likely viral etiology. No high fevers, only 2d of sx so far, will refrain from antibiotics for now, managing conservatively below.  Negative COVID and flu tests.  - Prescribed Afrin nasal spray, one spray each nostril twice daily for three days. - Prescribed ibuprofen 600 mg, one tablet every eight hours as needed for fever and body aches. - Advised to stop aspirin while taking ibuprofen to avoid interaction and reduce bleeding risk . - Prescribed  promethazine syrup, 5 mL at night for seven days for nighttime use, counseled about side effects - Prescribed Tessalon  Perles, one tablet every eight hours as needed for daytime cough. - Instructed to call clinic if symptoms do not improve in seven days, at that point would consider abx.   Return for worsening of symptoms or failure to improve.   Deryl Ports K Venesa Semidey, MD  03/15/2024     Contains text generated by Abridge.       [1]  Allergies Allergen Reactions   Iohexol      Code: HIVES, Desc: PT IS ALLERGIC TO IVP  DYE AS NOTED IN PREVIOUS RECORDS IN PACS 02/2009/RM, Onset Date: [id number]    Atorvastatin  Other (See Comments)    Myalgias with this and rosuvastin  [2]  Current Outpatient Medications on File Prior to Visit  Medication Sig Dispense Refill   ALPRAZolam  (XANAX ) 0.5 MG tablet TAKE 1 TABLET BY MOUTH THREE TIMES A DAY AS NEEDED 90 tablet 0   aspirin 81 MG chewable tablet Chew by mouth.     Blood Glucose Calibration (ONETOUCH VERIO) SOLN AS DIRECTED 1 each 1   Blood Glucose Monitoring Suppl (ONETOUCH VERIO FLEX SYSTEM) w/Device KIT Use as directed,once daily to test blood sugar 1 kit 0   esomeprazole  (NEXIUM ) 20 MG capsule Take 40 mg by mouth 2 (two) times daily before a meal.     FARXIGA  10 MG TABS tablet TAKE 1 TABLET BY MOUTH DAILY BEFORE BREAKFAST. 90 tablet 3   finasteride  (PROSCAR ) 5 MG tablet TAKE 1 TABLET BY MOUTH EVERY DAY 90 tablet 3   glipiZIDE  (GLUCOTROL  XL) 10 MG 24 hr tablet TAKE 1 TABLET (10 MG TOTAL) BY MOUTH DAILY WITH BREAKFAST. 90 tablet 3   glucose blood (ONETOUCH VERIO) test strip CHECK BLOOD SUGAR EVERY DAY 50 strip 11   HYDROcodone -acetaminophen  (NORCO/VICODIN) 5-325 MG tablet Take 1 tablet by mouth 3 (three) times daily as needed for moderate pain (pain score 4-6) (sedation caution). 15 tablet 0   hydrocortisone  2.5 % cream APPLY TO AFFECTED AREA 3 TIMES A DAY AS NEEDED 28 g 3   ketorolac (ACULAR) 0.5 % ophthalmic solution USE AS DIRECTED 5 mL 1   metFORMIN  (GLUCOPHAGE -XR) 500 MG 24 hr tablet TAKE 2 TABLETS (1,000 MG TOTAL) BY MOUTH IN THE MORNING AND AT BEDTIME. 360 tablet 3   Meth-Hyo-M Bl-Na Phos-Ph Sal (URO-MP) 118 MG CAPS Take 1 capsule by mouth every 6 (six) hours as needed.     nitroGLYCERIN (NITROSTAT) 0.4 MG SL tablet Place under the tongue.     ONETOUCH DELICA LANCETS FINE MISC 1 Units by Does not apply route daily. 100 each 2   tamsulosin  (FLOMAX ) 0.4 MG CAPS capsule TAKE 1 CAPSULE BY MOUTH EVERY DAY AFTER SUPPER 90 capsule 0   urea  (CARMOL) 40 % CREA APPLY  TOPICALLY 3 TIMES A DAY 28.35 g 1   vitamin B-12 (CYANOCOBALAMIN ) 1000 MCG tablet Take 1 tablet (1,000 mcg total) by mouth daily.     rosuvastatin  (CRESTOR ) 10 MG tablet TAKE 1 TABLET BY MOUTH EVERY DAY (Patient not taking: Reported on 03/15/2024) 90 tablet 3   No current facility-administered medications on file prior to visit.   "

## 2024-03-15 NOTE — Telephone Encounter (Signed)
 Appreciate Dr Bennett seeing pt

## 2024-03-16 LAB — P-TAU217/BETA-AMYLOID 1-42 RATIO, PLASMA

## 2024-03-18 ENCOUNTER — Ambulatory Visit: Payer: Self-pay

## 2024-03-19 ENCOUNTER — Ambulatory Visit: Admission: EM | Admit: 2024-03-19 | Discharge: 2024-03-19 | Disposition: A | Discharge status: Home / Self Care

## 2024-03-19 ENCOUNTER — Encounter: Payer: Self-pay | Admitting: Emergency Medicine

## 2024-03-19 DIAGNOSIS — J0100 Acute maxillary sinusitis, unspecified: Secondary | ICD-10-CM | POA: Diagnosis not present

## 2024-03-19 MED ORDER — PREDNISONE 10 MG (21) PO TBPK: ORAL_TABLET | Freq: Every day | ORAL | 0 refills | Status: AC

## 2024-03-19 MED ORDER — AMOXICILLIN-POT CLAVULANATE 875-125 MG PO TABS: 1.0000 | ORAL_TABLET | Freq: Two times a day (BID) | ORAL | 0 refills | Status: AC

## 2024-03-19 NOTE — Discharge Instructions (Signed)
 Take Augmentin  twice daily for 7 days for treatment of bacteria  Begin prednisone  every morning with food to reduce inflammation throughout the sinuses, this will temporarily increase your blood sugar therefore monitor closely    You can take Tylenol  and/or Ibuprofen  as needed for fever reduction and pain relief.   For cough: honey 1/2 to 1 teaspoon (you can dilute the honey in water or another fluid).  You can also use guaifenesin and dextromethorphan for cough. You can use a humidifier for chest congestion and cough.  If you don't have a humidifier, you can sit in the bathroom with the hot shower running.      For sore throat: try warm salt water gargles, cepacol lozenges, throat spray, warm tea or water with lemon/honey, popsicles or ice, or OTC cold relief medicine for throat discomfort.   For congestion: take a daily anti-histamine like Zyrtec, Claritin, and a oral decongestant, such as pseudoephedrine.  You can also use Flonase 1-2 sprays in each nostril daily.   It is important to stay hydrated: drink plenty of fluids (water, gatorade/powerade/pedialyte, juices, or teas) to keep your throat moisturized and help further relieve irritation/discomfort.

## 2024-03-19 NOTE — ED Provider Notes (Signed)
 " [name]    CSN: [id number] Arrival date & time: 03/19/24  1259      History   Chief Complaint Chief Complaint  Patient presents with   Generalized Body Aches   Cough   Nasal Congestion    HPI Alejandro Koch is a 74 y.o. male.   Patient presents for evaluation of nasal congestion, sinus pressure behind the eyes, productive cough and intermittent headaches present for 6 days.  Endorses symptoms are progressively worsening with cough and congestion becoming more prominent.  Has ear ringing at baseline unable to distinguish if worsening.  Saw by PCP 4 days ago, COVID and flu testing negative at that time.  Has attempted use of ibuprofen , Tessalon  and Promethazine  DM which have been ineffective.  Denies shortness of breath or wheezing.     Past Medical History:  Diagnosis Date   Abdominal discomfort, epigastric 06/10/2017   Actinic keratoses 05/12/2014   Acute chest pain 12/20/2007   Acute prostatitis 06/10/2017   Classic presentation; will treat with sulfa  x 2 weeks. He did well with this back in March   Allergic rhinitis 09/30/2006   Anemia    Benign essential hypertension 09/30/2006   BP Readings from Last 3 Encounters: 06/16/17 138/72  06/09/17 118/66  12/09/16 136/82   Reasonable control   Blood transfusion without reported diagnosis    40 yrs ago    BPH with obstruction/lower urinary tract symptoms 12/27/2018   Still with mild symptoms. If worsens, will need to go back to the urologist   CHF (congestive heart failure) (HCC)    Chronic cough 07/16/2018   Fits definition of chronic bronchitis except not 2 years in a row. Seeing the pulmonary doctor. Doesn't feel the breo has helped   Disequilibrium 06/20/2022   Extensor tendon laceration of right hand with open wound 09/16/2016   Fatigue 06/10/2017   Fatty liver 03/01/2010   LFTs normal. Still needs more weight loss   GERD (gastroesophageal reflux disease) 09/30/2006   Tried omeprazole and symptoms all came  back. Will order the nexium  again   Glaucoma    Hyperlipemia 09/30/2006   Inconsistent with the atorvastatin . Discussed---will take it twice a week   Hyperlipidemia    Hypogonadism in male 09/19/2009   Kidney stones    Laryngopharyngeal reflux (LPR) 06/03/2016   Left foot pain 09/20/2019   Major depressive disorder    Mild neurocognitive disorder 05/18/2020   OSA (obstructive sleep apnea) 09/30/2006   NPSG 07/2014:  AHI 23/hr, PLMS 452 with 12/hr with A/A NPSG 07/2014:  AHI 23/hr, PLMS 452 with 12/hr with A/A. The patient has moderate obstructive sleep apnea by his recent study, and is very symptomatic during the day. I have discussed with him the various treatment options, and have recommended a trial of C P   Sensorineural hearing loss (SNHL) of both ears 06/20/2022   Shortness of breath 05/21/2015   09/11/17 Right and Left Heart cath: Right radial and brachial access.Left dominant coronary system with no CAD RHC with some exercise showed RA 15, 14 mm Hg (mean 12)PA 37/14  mean 27,PWP 17,15 mean 16   Skin lesion 11/06/2021   Tinnitus of both ears 06/20/2022   Type 2 DM with diabetic neuropathy affecting both sides of body (HCC) 03/05/2009   HGBA1C 8.7 (A) 03/30/2018. Not much better. I want to send him to endocrine--he wants to hold off (doesn't want insulin, etc) Needs formal help---like [employer] Will give him 3 months more  Vitamin B12 deficiency 06/16/2017   Does take supplement daily. Will check with full labs at next visit    Patient Active Problem List   Diagnosis Date Noted   Acute pain of left shoulder 12/27/2023   Glaucoma 07/10/2022   Sensorineural hearing loss (SNHL) of both ears 06/20/2022   Tinnitus of both ears 06/20/2022   Preventative health care 03/01/2021   Mild neurocognitive disorder 05/18/2020   BPH with obstruction/lower urinary tract symptoms 12/27/2018   Vitamin B12 deficiency 06/16/2017   Laryngopharyngeal reflux (LPR) 06/03/2016   Anemia 07/23/2015    Actinic keratosis 05/12/2014   Fatty liver 03/01/2010   Hypogonadism in male 09/19/2009   Type 2 DM with diabetic neuropathy affecting both sides of body (HCC) 03/05/2009   Hyperlipemia 09/30/2006   Benign essential hypertension 09/30/2006   Allergic rhinitis 09/30/2006   GERD (gastroesophageal reflux disease) 09/30/2006   OSA (obstructive sleep apnea) 09/30/2006    Past Surgical History:  Procedure Laterality Date   CARDIAC CATHETERIZATION     [facility] - myocard perf and echo done as well - all normal- EF 48-55% 2019 April and june 2019   COLONOSCOPY  2008   EYE SURGERY  6 months ago   Cataract x2   FINGER SURGERY  2013   infection 2nd finger left hand   POLYPECTOMY     REPAIR FLEXOR TENDON HAND Right 08/2016       Home Medications    Prior to Admission medications  Medication Sig Start Date End Date Taking? Authorizing Provider  dorzolamide-timolol (COSOPT) 2-0.5 % ophthalmic solution Place 1 drop into both eyes 2 (two) times daily. 02/17/24  Yes [provider]  ALPRAZolam  (XANAX ) 0.5 MG tablet TAKE 1 TABLET BY MOUTH THREE TIMES A DAY AS NEEDED 05/02/21   Jimmy Charlie FERNS, MD  aspirin 81 MG chewable tablet Chew by mouth. 09/09/16   [provider]  benzonatate  (TESSALON ) 200 MG capsule Take 1 capsule (200 mg total) by mouth 3 (three) times daily as needed for up to 14 days for cough. 03/15/24 03/29/24  Bowa, Nahosi K, MD  Blood Glucose Calibration (ONETOUCH VERIO) SOLN AS DIRECTED 09/06/21   Jimmy Charlie FERNS, MD  Blood Glucose Monitoring Suppl (ONETOUCH VERIO FLEX SYSTEM) w/Device KIT Use as directed,once daily to test blood sugar 09/06/21   Jimmy Charlie FERNS, MD  esomeprazole  (NEXIUM ) 20 MG capsule Take 40 mg by mouth 2 (two) times daily before a meal.    [provider]  FARXIGA  10 MG TABS tablet TAKE 1 TABLET BY MOUTH DAILY BEFORE BREAKFAST. 07/06/23   Jimmy Charlie I, MD  finasteride  (PROSCAR ) 5 MG tablet TAKE 1 TABLET BY MOUTH EVERY DAY 10/02/23    Jimmy Charlie FERNS, MD  glipiZIDE  (GLUCOTROL  XL) 10 MG 24 hr tablet TAKE 1 TABLET (10 MG TOTAL) BY MOUTH DAILY WITH BREAKFAST. 03/02/23   Jimmy Charlie FERNS, MD  glucose blood (ONETOUCH VERIO) test strip CHECK BLOOD SUGAR EVERY DAY 04/10/23   Jimmy Charlie FERNS, MD  HYDROcodone -acetaminophen  (NORCO/VICODIN) 5-325 MG tablet Take 1 tablet by mouth 3 (three) times daily as needed for moderate pain (pain score 4-6) (sedation caution). 12/25/23   Cleatus Arlyss RAMAN, MD  hydrocortisone  2.5 % cream APPLY TO AFFECTED AREA 3 TIMES A DAY AS NEEDED 05/06/23   Jimmy Charlie FERNS, MD  ibuprofen  (ADVIL ) 600 MG tablet Take 1 tablet (600 mg total) by mouth every 8 (eight) hours as needed for up to 10 days for moderate pain (pain score 4-6). 03/15/24 03/25/24  Bowa, Nahosi K, MD  ketorolac (ACULAR) 0.5 % ophthalmic solution USE AS DIRECTED 06/27/13   Mavis Norleen BRAVO, MD  metFORMIN  (GLUCOPHAGE -XR) 500 MG 24 hr tablet TAKE 2 TABLETS (1,000 MG TOTAL) BY MOUTH IN THE MORNING AND AT BEDTIME. 07/10/23   Letvak, Richard I, MD  Meth-Hyo-M Bl-Na Phos-Ph Sal (URO-MP) 118 MG CAPS Take 1 capsule by mouth every 6 (six) hours as needed. 05/09/16   [provider]  nitroGLYCERIN (NITROSTAT) 0.4 MG SL tablet Place under the tongue. 06/25/17 03/15/24  [provider]  AISHA PASTOR LANCETS FINE MISC 1 Units by Does not apply route daily. 07/26/15   Jimmy Charlie FERNS, MD  promethazine -dextromethorphan (PROMETHAZINE -DM) 6.25-15 MG/5ML syrup Take 5 mLs by mouth at bedtime for 10 days. 03/15/24 03/25/24  Bennett Reuben POUR, MD  rosuvastatin  (CRESTOR ) 10 MG tablet TAKE 1 TABLET BY MOUTH EVERY DAY Patient not taking: Reported on 03/15/2024 02/23/23   Letvak, Richard I, MD  tamsulosin  (FLOMAX ) 0.4 MG CAPS capsule TAKE 1 CAPSULE BY MOUTH EVERY DAY AFTER SUPPER 09/09/23   Jimmy Charlie I, MD  urea  (CARMOL) 40 % CREA APPLY TOPICALLY 3 TIMES A DAY 11/03/22   Jimmy Charlie FERNS, MD  vitamin B-12 (CYANOCOBALAMIN ) 1000 MCG tablet Take 1 tablet (1,000  mcg total) by mouth daily. 06/10/17   Rilla Baller, MD    Family History Family History  Problem Relation Age of Onset   Alzheimer's disease Mother    Hypertension Mother    Heart disease Father    COPD Father    Colon cancer Father 26   Prostate cancer Father    Dementia Father    Colon polyps Brother    Alzheimer's disease Maternal Grandmother    Leukemia Brother    Leukemia Other    Diabetes Son    Stomach cancer Neg Hx    Esophageal cancer Neg Hx    Rectal cancer Neg Hx     Social History Social History[1]   Allergies   Iohexol and Atorvastatin    Review of Systems Review of Systems  Constitutional: Negative.   HENT:  Positive for congestion and sinus pressure. Negative for dental problem, drooling, ear discharge, ear pain, facial swelling, hearing loss, mouth sores, nosebleeds, postnasal drip, rhinorrhea, sinus pain, sneezing, sore throat, tinnitus, trouble swallowing and voice change.   Respiratory:  Positive for cough. Negative for apnea, choking, chest tightness, shortness of breath, wheezing and stridor.   Gastrointestinal: Negative.   Neurological:  Positive for headaches. Negative for dizziness, tremors, seizures, syncope, facial asymmetry, speech difficulty, weakness, light-headedness and numbness.     Physical Exam Triage Vital Signs ED Triage Vitals  Encounter Vitals Group     BP 03/19/24 1427 (!) 158/71     Girls Systolic BP Percentile --      Girls Diastolic BP Percentile --      Boys Systolic BP Percentile --      Boys Diastolic BP Percentile --      Pulse Rate 03/19/24 1427 71     Resp 03/19/24 1427 20     Temp 03/19/24 1427 98.1 F (36.7 C)     Temp Source 03/19/24 1427 Oral     SpO2 03/19/24 1427 98 %     Weight --      Height --      Head Circumference --      Peak Flow --      Pain Score 03/19/24 1432 6     Pain Loc --  Pain Education --      Exclude from Growth Chart --    No data found.  Updated Vital Signs BP (!)  158/71   Pulse 71   Temp 98.1 F (36.7 C) (Oral)   Resp 20   SpO2 98%   Visual Acuity Right Eye Distance:   Left Eye Distance:   Bilateral Distance:    Right Eye Near:   Left Eye Near:    Bilateral Near:     Physical Exam Constitutional:      Appearance: Normal appearance.  HENT:     Nose: Congestion present.     Mouth/Throat:     Pharynx: No oropharyngeal exudate or posterior oropharyngeal erythema.  Eyes:     Extraocular Movements: Extraocular movements intact.  Cardiovascular:     Rate and Rhythm: Normal rate and regular rhythm.     Pulses: Normal pulses.     Heart sounds: Normal heart sounds.  Pulmonary:     Effort: Pulmonary effort is normal.     Breath sounds: Normal breath sounds.  Musculoskeletal:     Cervical back: Normal range of motion and neck supple.  Lymphadenopathy:     Cervical: Cervical adenopathy present.  Neurological:     Mental Status: He is alert and oriented to person, place, and time. Mental status is at baseline.      UC Treatments / Results  Labs (all labs ordered are listed, but only abnormal results are displayed) Labs Reviewed - No data to display  EKG   Radiology No results found.  Procedures Procedures (including critical care time)  Medications Ordered in UC Medications - No data to display  Initial Impression / Assessment and Plan / UC Course  I have reviewed the triage vital signs and the nursing notes.  Pertinent labs & imaging results that were available during my care of the patient were reviewed by me and considered in my medical decision making (see chart for details).  Acute nonrecurrent maxillary sinusitis  Patient is in no signs of distress nor toxic appearing.  Vital signs are stable.  Low suspicion for pneumonia, pneumothorax or bronchitis and therefore will defer imaging.  Symptoms present for 6 days, endorsing symptoms are worsening empirically placed on Augmentin  and additionally prescribed prednisone ,  discussed monitoring blood sugar closely during treatment. may use additional over-the-counter medications as needed for supportive care.  May follow-up with urgent care as needed if symptoms persist or worsen  Final Clinical Impressions(s) / UC Diagnoses   Final diagnoses:  None   Discharge Instructions   None    ED Prescriptions   None    PDMP not reviewed this encounter.     [1]  Social History Tobacco Use   Smoking status: Former    Current packs/day: 0.00    Average packs/day: 2.5 packs/day for 7.0 years (17.5 ttl pk-yrs)    Types: Cigarettes    Start date: 03/17/1976    Quit date: 03/18/1983    Years since quitting: 41.0    Passive exposure: Past   Smokeless tobacco: Never  Vaping Use   Vaping status: Never Used  Substance Use Topics   Alcohol use: Not Currently    Comment: occasional glass of wine   Drug use: No     Teresa Shelba SAUNDERS, NP 03/19/24 1500  "

## 2024-03-19 NOTE — ED Triage Notes (Signed)
 Patient reports cough, sneezing, nasal drainage with green mucus and bodyaches x 6 days. Patient was treated for the same on 03-15-24. Patient was given promethazine -DM. Patient states it did not help. Rates pain 6/10.

## 2024-03-21 LAB — P-TAU217/BETA-AMYLOID 1-42 RATIO, PLASMA
PTAU217/ABETA 42 RATIO: 22.1 pg/mL
PTAU217: 0.771 pg/mL

## 2024-03-22 ENCOUNTER — Encounter: Payer: Self-pay | Admitting: Family Medicine

## 2024-03-22 ENCOUNTER — Ambulatory Visit (INDEPENDENT_AMBULATORY_CARE_PROVIDER_SITE_OTHER): Admitting: Family Medicine

## 2024-03-22 VITALS — BP 150/90 | HR 74 | Temp 97.7°F | Ht 77.0 in | Wt 220.8 lb

## 2024-03-22 DIAGNOSIS — G3184 Mild cognitive impairment, so stated: Secondary | ICD-10-CM

## 2024-03-22 DIAGNOSIS — H409 Unspecified glaucoma: Secondary | ICD-10-CM

## 2024-03-22 DIAGNOSIS — Z7984 Long term (current) use of oral hypoglycemic drugs: Secondary | ICD-10-CM | POA: Diagnosis not present

## 2024-03-22 DIAGNOSIS — E1140 Type 2 diabetes mellitus with diabetic neuropathy, unspecified: Secondary | ICD-10-CM

## 2024-03-22 DIAGNOSIS — I10 Essential (primary) hypertension: Secondary | ICD-10-CM

## 2024-03-22 LAB — POCT GLYCOSYLATED HEMOGLOBIN (HGB A1C): Hemoglobin A1C: 7.2 % — AB (ref 4.0–5.6)

## 2024-03-22 MED ORDER — BLOOD GLUCOSE TEST VI STRP: 1.0000 | ORAL_STRIP | 0 refills | Status: DC

## 2024-03-22 MED ORDER — BLOOD GLUCOSE MONITORING SUPPL DEVI: 1.0000 | 0 refills | Status: AC

## 2024-03-22 MED ORDER — LANCETS MISC: 1.0000 | 0 refills | Status: AC

## 2024-03-22 MED ORDER — LANCET DEVICE MISC: 1.0000 | 0 refills | Status: AC

## 2024-03-22 MED ORDER — GLIPIZIDE ER 10 MG PO TB24: 10.0000 mg | ORAL_TABLET | Freq: Every day | ORAL | 3 refills | Status: AC

## 2024-03-22 MED ORDER — ROSUVASTATIN CALCIUM 10 MG PO TABS: 10.0000 mg | ORAL_TABLET | Freq: Every day | ORAL | 3 refills | Status: AC

## 2024-03-22 MED ORDER — TAMSULOSIN HCL 0.4 MG PO CAPS: 0.4000 mg | ORAL_CAPSULE | Freq: Every day | ORAL | 3 refills | Status: AC

## 2024-03-22 NOTE — Assessment & Plan Note (Addendum)
 Update A1c today - adequate.  He continues metformin , farxiga , glipizide  XL.  RTC 3 mo DM f/u visit

## 2024-03-22 NOTE — Patient Instructions (Addendum)
 A1c today 7.2% Medicines refilled New glucometer sent to pharmacy  BP is staying elevated, may be from recent sinusitis and treatment. Start monitoring blood pressures at home, especially once fully better from sinusitis. Let me know if consistently >140/90 to discuss medication sooner.  Return in 3 months for diabetes and hypertension follow up visit.

## 2024-03-22 NOTE — Progress Notes (Signed)
 " Ph: [phone] Fax: [phone]   Patient ID: Alejandro Koch, male    DOB: Jul 08, 1950, 74 y.o.   MRN: [id number]  This visit was conducted in person.  BP (!) 150/90 (BP Location: Right Arm, Patient Position: Sitting, Cuff Size: Normal)   Pulse 74   Temp 97.7 F (36.5 C) (Oral)   Ht 6' 5 (1.956 m)   Wt 220 lb 12.8 oz (100.2 kg)   SpO2 97%   BMI 26.18 kg/m   BP Readings from Last 3 Encounters:  03/22/24 (!) 150/90  03/19/24 (!) 158/71  03/15/24 138/80    CC: transfer of care from Dr Liisa Subjective:   HPI: Alejandro Koch is a 74 y.o. male presenting on 03/22/2024 for Transitions Of Care (Transfer of care, no acute concerns/Eye exam scheduled with Dr. Shah/Pt's wife Mrs Sanderfer in room, states she will call to schedule colonoscopy )   Last CPE 09/2023 reviewed.   Recently seen here followed by [facility] with negative COVID and flu, dx acute sinusitis treating with augmentin  coures + prednisone  burst currently on this course. Symptoms are improving.   Seen at ER 01/13/2024 with dx R sided bell's palsy treated with prednisone  60mg  burst and valacyclovir  1000mg  TID course. Symptoms significantly improved after treatment.   Mild cognitive impairment followed by Dr Georjean neurology, last seen 12/2023, note reviewed. Initially referred 2022. P-tau/beta amyloid drawn but but testing not completed. Next appt is 08/11/2024.  Glaucoma on Cosopt eye drops through Dr Medford Fairly.   HTN - h/o BP meds, but not since weight loss.  DM - was previously on insulin. Now only on glipizide  XL 10mg  daily, farxiga  10mg  daily, metformin  XR 1000mg  bid. He does regularly check sugars - this morning 126. No blurry vision or paresthesias. No low sugars or hypoglycemic symptoms. Last diabetic eye exam: upcoming 03/2024. Last foot exam 09/2023. Glucometer: one touch verio - planning to switch to accuchek.  Lab Results  Component Value Date   HGBA1C 7.2 (A) 03/22/2024   Diabetic Foot Exam - Simple   Simple  Foot Form Diabetic Foot exam was performed with the following findings: Yes 03/22/2024 11:15 AM  Visual Inspection No deformities, no ulcerations, no other skin breakdown bilaterally: Yes Sensation Testing See comments: Yes Pulse Check Posterior Tibialis and Dorsalis pulse intact bilaterally: Yes Comments No claudication Diminished sensation to monofilament testing       Has lost 100 lbs- this is not unintentional. 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 Significant decrease in stress since retirement.   Overall eats healthy.  Physical activity - walking [place] 2 miles daily regular.      Relevant past medical, surgical, family and social history reviewed and updated as indicated. Interim medical history since our last visit reviewed. Allergies and medications reviewed and updated. Outpatient Medications Prior to Visit  Medication Sig Dispense Refill   ALPRAZolam  (XANAX ) 0.5 MG tablet TAKE 1 TABLET BY MOUTH THREE TIMES A DAY AS NEEDED 90 tablet 0   aspirin 81 MG chewable tablet Chew by mouth.     dorzolamide-timolol (COSOPT) 2-0.5 % ophthalmic solution Place 1 drop into both eyes 2 (two) times daily.     esomeprazole  (NEXIUM ) 20 MG capsule Take 40 mg by mouth 2 (two) times daily before a meal.     FARXIGA  10 MG TABS tablet TAKE 1 TABLET BY MOUTH DAILY BEFORE BREAKFAST. 90 tablet 3   finasteride  (PROSCAR ) 5 MG tablet TAKE 1 TABLET BY MOUTH EVERY DAY 90  tablet 3   HYDROcodone -acetaminophen  (NORCO/VICODIN) 5-325 MG tablet Take 1 tablet by mouth 3 (three) times daily as needed for moderate pain (pain score 4-6) (sedation caution). 15 tablet 0   hydrocortisone  2.5 % cream APPLY TO AFFECTED AREA 3 TIMES A DAY AS NEEDED 28 g 3   ibuprofen  (ADVIL ) 600 MG tablet Take 1 tablet (600 mg total) by mouth every 8 (eight) hours as needed for up to 10 days for moderate pain (pain score 4-6). 30 tablet 0   ketorolac (ACULAR) 0.5 % ophthalmic solution USE AS DIRECTED 5 mL 1    metFORMIN  (GLUCOPHAGE -XR) 500 MG 24 hr tablet TAKE 2 TABLETS (1,000 MG TOTAL) BY MOUTH IN THE MORNING AND AT BEDTIME. 360 tablet 3   Meth-Hyo-M Bl-Na Phos-Ph Sal (URO-MP) 118 MG CAPS Take 1 capsule by mouth every 6 (six) hours as needed.     nitroGLYCERIN (NITROSTAT) 0.4 MG SL tablet Place under the tongue.     predniSONE  (STERAPRED UNI-PAK 21 TAB) 10 MG (21) TBPK tablet Take by mouth daily. Take 6 tabs by mouth daily  for 1 days, then 5 tabs for 1 days, then 4 tabs for 1 days, then 3 tabs for 1 days, 2 tabs for 1 days, then 1 tab by mouth daily for 1 days 21 tablet 0   promethazine -dextromethorphan (PROMETHAZINE -DM) 6.25-15 MG/5ML syrup Take 5 mLs by mouth at bedtime for 10 days. 118 mL 0   urea  (CARMOL) 40 % CREA APPLY TOPICALLY 3 TIMES A DAY 28.35 g 1   vitamin B-12 (CYANOCOBALAMIN ) 1000 MCG tablet Take 1 tablet (1,000 mcg total) by mouth daily.     Blood Glucose Calibration (ONETOUCH VERIO) SOLN AS DIRECTED 1 each 1   Blood Glucose Monitoring Suppl (ONETOUCH VERIO FLEX SYSTEM) w/Device KIT Use as directed,once daily to test blood sugar 1 kit 0   glipiZIDE  (GLUCOTROL  XL) 10 MG 24 hr tablet TAKE 1 TABLET (10 MG TOTAL) BY MOUTH DAILY WITH BREAKFAST. 90 tablet 3   glucose blood (ONETOUCH VERIO) test strip CHECK BLOOD SUGAR EVERY DAY 50 strip 11   ONETOUCH DELICA LANCETS FINE MISC 1 Units by Does not apply route daily. 100 each 2   rosuvastatin  (CRESTOR ) 10 MG tablet TAKE 1 TABLET BY MOUTH EVERY DAY 90 tablet 3   tamsulosin  (FLOMAX ) 0.4 MG CAPS capsule TAKE 1 CAPSULE BY MOUTH EVERY DAY AFTER SUPPER 90 capsule 0   amoxicillin -clavulanate (AUGMENTIN ) 875-125 MG tablet Take 1 tablet by mouth every 12 (twelve) hours. (Patient not taking: Reported on 03/22/2024) 14 tablet 0   benzonatate  (TESSALON ) 200 MG capsule Take 1 capsule (200 mg total) by mouth 3 (three) times daily as needed for up to 14 days for cough. (Patient not taking: Reported on 03/22/2024) 42 capsule 0   No facility-administered medications  prior to visit.     Per HPI unless specifically indicated in ROS section below Review of Systems  Objective:  BP (!) 150/90 (BP Location: Right Arm, Patient Position: Sitting, Cuff Size: Normal)   Pulse 74   Temp 97.7 F (36.5 C) (Oral)   Ht 6' 5 (1.956 m)   Wt 220 lb 12.8 oz (100.2 kg)   SpO2 97%   BMI 26.18 kg/m   Wt Readings from Last 3 Encounters:  03/22/24 220 lb 12.8 oz (100.2 kg)  03/15/24 222 lb (100.7 kg)  01/13/24 222 lb 15.9 oz (101.1 kg)      Physical Exam Vitals and nursing note reviewed.  Constitutional:      Appearance:  Normal appearance. He is not ill-appearing.  HENT:     Head: Normocephalic and atraumatic.     Comments: Wears hearing aides    Mouth/Throat:     Mouth: Mucous membranes are moist.     Pharynx: Oropharynx is clear. No oropharyngeal exudate or posterior oropharyngeal erythema.  Eyes:     Extraocular Movements: Extraocular movements intact.     Conjunctiva/sclera: Conjunctivae normal.     Pupils: Pupils are equal, round, and reactive to light.  Cardiovascular:     Rate and Rhythm: Normal rate and regular rhythm.     Pulses: Normal pulses.     Heart sounds: Normal heart sounds. No murmur heard. Pulmonary:     Effort: Pulmonary effort is normal. No respiratory distress.     Breath sounds: Normal breath sounds. No wheezing, rhonchi or rales.  Musculoskeletal:     Right lower leg: No edema.     Left lower leg: No edema.     Comments: See HPI for foot exam if done  Skin:    General: Skin is warm and dry.     Findings: No rash.  Neurological:     Mental Status: He is alert.  Psychiatric:        Mood and Affect: Mood normal.        Behavior: Behavior normal.       Results for orders placed or performed in visit on 03/22/24  HgB A1c   Collection Time: 03/22/24 11:27 AM  Result Value Ref Range   Hemoglobin A1C 7.2 (A) 4.0 - 5.6 %   HbA1c POC (<> result, manual entry)     HbA1c, POC (prediabetic range)     HbA1c, POC (controlled  diabetic range)      Assessment & Plan:   Problem List Items Addressed This Visit     Type 2 diabetes mellitus with diabetic neuropathy, without long-term current use of insulin (HCC) - Primary   Update A1c today - adequate.  He continues metformin , farxiga , glipizide  XL.  RTC 3 mo DM f/u visit       Relevant Medications   glipiZIDE  (GLUCOTROL  XL) 10 MG 24 hr tablet   rosuvastatin  (CRESTOR ) 10 MG tablet   Other Relevant Orders   HgB A1c (Completed)   Benign essential hypertension   BP above goal on presentation , remains elevated on repeat testing.  Advised start monitoring BP at home and notify me if persistently >140/90 to consider medication. Will reassess once improved from acute sinusitis.       Relevant Medications   rosuvastatin  (CRESTOR ) 10 MG tablet   Mild neurocognitive disorder   Regularly sees neurology      Glaucoma   On Cosopt eye drops.         Meds ordered this encounter  Medications   glipiZIDE  (GLUCOTROL  XL) 10 MG 24 hr tablet    Sig: Take 1 tablet (10 mg total) by mouth daily with breakfast.    Dispense:  90 tablet    Refill:  3   rosuvastatin  (CRESTOR ) 10 MG tablet    Sig: Take 1 tablet (10 mg total) by mouth daily.    Dispense:  90 tablet    Refill:  3   tamsulosin  (FLOMAX ) 0.4 MG CAPS capsule    Sig: Take 1 capsule (0.4 mg total) by mouth daily after supper.    Dispense:  90 capsule    Refill:  3   Blood Glucose Monitoring Suppl DEVI    Sig: 1 each by  Does not apply route as directed. Dispense based on patient and insurance preference. Use up to four times daily as directed. (FOR ICD-10 E10.9, E11.9).    Dispense:  1 each    Refill:  0   Glucose Blood (BLOOD GLUCOSE TEST STRIPS) STRP    Sig: 1 each by Does not apply route as directed. Dispense based on patient and insurance preference. Use up to four times daily as directed. (FOR ICD-10 E10.9, E11.9).    Dispense:  100 strip    Refill:  0   Lancet Device MISC    Sig: 1 each by Does not  apply route as directed. Dispense based on patient and insurance preference. Use up to four times daily as directed. (FOR ICD-10 E10.9, E11.9).    Dispense:  1 each    Refill:  0   Lancets MISC    Sig: 1 each by Does not apply route as directed. Dispense based on patient and insurance preference. Use up to four times daily as directed. (FOR ICD-10 E10.9, E11.9).    Dispense:  100 each    Refill:  0    Orders Placed This Encounter  Procedures   HgB A1c    Patient Instructions  A1c today 7.2% Medicines refilled New glucometer sent to pharmacy  BP is staying elevated, may be from recent sinusitis and treatment. Start monitoring blood pressures at home, especially once fully better from sinusitis. Let me know if consistently >140/90 to discuss medication sooner.  Return in 3 months for diabetes and hypertension follow up visit.   Follow up plan: Return in about 3 months (around 06/20/2024) for follow up visit.  Anton Blas, MD   "

## 2024-03-22 NOTE — Assessment & Plan Note (Signed)
 Regularly sees neurology

## 2024-03-22 NOTE — Assessment & Plan Note (Addendum)
 BP above goal on presentation , remains elevated on repeat testing.  Advised start monitoring BP at home and notify me if persistently >140/90 to consider medication. Will reassess once improved from acute sinusitis.

## 2024-03-22 NOTE — Assessment & Plan Note (Signed)
 On Cosopt eye drops.

## 2024-03-24 ENCOUNTER — Encounter: Payer: Self-pay | Admitting: Family Medicine

## 2024-04-01 NOTE — Progress Notes (Signed)
 Alejandro Koch                                          MRN: [id number]   04/01/2024   The [facility] Quality Team Specialist reviewed this patient medical record for the purposes of chart review for care gap closure. The following were reviewed: abstraction for care gap closure-glycemic status assessment.    [facility] Quality Team

## 2024-04-15 ENCOUNTER — Telehealth: Payer: Self-pay | Admitting: Neurology

## 2024-04-15 NOTE — Telephone Encounter (Signed)
 Pt called he wanted the number to the lab [phone] so he could get the memory test repeated

## 2024-04-15 NOTE — Telephone Encounter (Signed)
 Pt called in this afternoon and  he wants a return call. Pt stated that he wants to discuss some blood work. Thanks

## 2024-04-17 ENCOUNTER — Other Ambulatory Visit: Payer: Self-pay | Admitting: Family Medicine

## 2024-06-20 ENCOUNTER — Ambulatory Visit: Admitting: Family Medicine

## 2024-08-03 ENCOUNTER — Ambulatory Visit

## 2024-08-11 ENCOUNTER — Ambulatory Visit: Payer: Self-pay | Admitting: Neurology
